# Patient Record
Sex: Female | Born: 1975 | Race: White | Hispanic: No | Smoking: Never smoker
Health system: Southern US, Community
[De-identification: ages and names within clinical notes are randomized; demographics above are authoritative.]

## PROBLEM LIST (undated history)

## (undated) DIAGNOSIS — R112 Nausea with vomiting, unspecified: Secondary | ICD-10-CM

## (undated) DIAGNOSIS — Z800 Family history of malignant neoplasm of digestive organs: Secondary | ICD-10-CM

## (undated) DIAGNOSIS — Z803 Family history of malignant neoplasm of breast: Secondary | ICD-10-CM

## (undated) DIAGNOSIS — Z98811 Dental restoration status: Secondary | ICD-10-CM

## (undated) DIAGNOSIS — Z9889 Other specified postprocedural states: Secondary | ICD-10-CM

## (undated) DIAGNOSIS — C50919 Malignant neoplasm of unspecified site of unspecified female breast: Secondary | ICD-10-CM

## (undated) DIAGNOSIS — Z87898 Personal history of other specified conditions: Secondary | ICD-10-CM

## (undated) DIAGNOSIS — Z8052 Family history of malignant neoplasm of bladder: Secondary | ICD-10-CM

## (undated) DIAGNOSIS — G43909 Migraine, unspecified, not intractable, without status migrainosus: Secondary | ICD-10-CM

## (undated) HISTORY — DX: Family history of malignant neoplasm of digestive organs: Z80.0

## (undated) HISTORY — DX: Migraine, unspecified, not intractable, without status migrainosus: G43.909

## (undated) HISTORY — PX: BUNIONECTOMY: SHX129

## (undated) HISTORY — DX: Family history of malignant neoplasm of bladder: Z80.52

## (undated) HISTORY — PX: SHOULDER ARTHROSCOPY W/ LABRAL REPAIR: SHX2399

## (undated) HISTORY — PX: SHOULDER ARTHROSCOPY W/ ROTATOR CUFF REPAIR: SHX2400

## (undated) HISTORY — DX: Family history of malignant neoplasm of breast: Z80.3

---

## 1999-07-26 ENCOUNTER — Other Ambulatory Visit: Admission: RE | Admit: 1999-07-26 | Discharge: 1999-07-26 | Payer: Self-pay | Admitting: *Deleted

## 2003-07-15 ENCOUNTER — Ambulatory Visit: Admission: RE | Admit: 2003-07-15 | Discharge: 2003-07-15 | Payer: Self-pay

## 2003-07-26 ENCOUNTER — Emergency Department (HOSPITAL_COMMUNITY): Admission: EM | Admit: 2003-07-26 | Discharge: 2003-07-26 | Payer: Self-pay | Admitting: Emergency Medicine

## 2003-12-11 ENCOUNTER — Ambulatory Visit (HOSPITAL_COMMUNITY): Admission: RE | Admit: 2003-12-11 | Discharge: 2003-12-11 | Payer: Self-pay | Admitting: Neurology

## 2004-04-08 ENCOUNTER — Emergency Department (HOSPITAL_COMMUNITY): Admission: EM | Admit: 2004-04-08 | Discharge: 2004-04-08 | Payer: Self-pay | Admitting: Emergency Medicine

## 2004-07-20 ENCOUNTER — Emergency Department (HOSPITAL_COMMUNITY): Admission: EM | Admit: 2004-07-20 | Discharge: 2004-07-20 | Payer: Self-pay | Admitting: Emergency Medicine

## 2004-07-23 ENCOUNTER — Encounter: Admission: RE | Admit: 2004-07-23 | Discharge: 2004-07-23 | Payer: Self-pay | Admitting: Oncology

## 2004-11-19 ENCOUNTER — Inpatient Hospital Stay (HOSPITAL_COMMUNITY): Admission: AD | Admit: 2004-11-19 | Discharge: 2004-11-26 | Payer: Self-pay | Admitting: Oncology

## 2004-11-19 ENCOUNTER — Ambulatory Visit: Payer: Self-pay | Admitting: Internal Medicine

## 2004-11-19 ENCOUNTER — Ambulatory Visit: Payer: Self-pay | Admitting: *Deleted

## 2004-11-21 ENCOUNTER — Encounter: Payer: Self-pay | Admitting: Internal Medicine

## 2005-02-19 ENCOUNTER — Encounter: Admission: RE | Admit: 2005-02-19 | Discharge: 2005-02-19 | Payer: Self-pay | Admitting: Oncology

## 2005-04-02 ENCOUNTER — Ambulatory Visit: Admission: RE | Admit: 2005-04-02 | Discharge: 2005-04-02 | Payer: Self-pay | Admitting: Gynecologic Oncology

## 2005-05-01 HISTORY — PX: CERVICAL CONE BIOPSY: SUR198

## 2005-05-07 ENCOUNTER — Ambulatory Visit (HOSPITAL_COMMUNITY): Admission: RE | Admit: 2005-05-07 | Discharge: 2005-05-07 | Payer: Self-pay | Admitting: Gynecologic Oncology

## 2005-06-04 ENCOUNTER — Ambulatory Visit: Admission: RE | Admit: 2005-06-04 | Discharge: 2005-06-04 | Payer: Self-pay | Admitting: Gynecologic Oncology

## 2005-09-05 ENCOUNTER — Ambulatory Visit: Payer: Self-pay | Admitting: Oncology

## 2005-10-01 ENCOUNTER — Ambulatory Visit: Admission: RE | Admit: 2005-10-01 | Discharge: 2005-10-01 | Payer: Self-pay | Admitting: Gynecologic Oncology

## 2005-10-01 ENCOUNTER — Other Ambulatory Visit: Admission: RE | Admit: 2005-10-01 | Discharge: 2005-10-01 | Payer: Self-pay | Admitting: Gynecologic Oncology

## 2005-10-01 LAB — CBC WITH DIFFERENTIAL/PLATELET
BASO%: 0.3 % (ref 0.0–2.0)
Basophils Absolute: 0 10*3/uL (ref 0.0–0.1)
EOS%: 0.7 % (ref 0.0–7.0)
Eosinophils Absolute: 0 10*3/uL (ref 0.0–0.5)
HCT: 39.9 % (ref 34.8–46.6)
HGB: 14 g/dL (ref 11.6–15.9)
LYMPH%: 44.2 % (ref 14.0–48.0)
MCH: 32.4 pg (ref 26.0–34.0)
MCHC: 35.1 g/dL (ref 32.0–36.0)
MCV: 92.3 fL (ref 81.0–101.0)
MONO#: 0.3 10*3/uL (ref 0.1–0.9)
MONO%: 11.6 % (ref 0.0–13.0)
NEUT#: 1.2 10*3/uL — ABNORMAL LOW (ref 1.5–6.5)
NEUT%: 43.2 % (ref 39.6–76.8)
Platelets: 219 10*3/uL (ref 145–400)
RBC: 4.33 10*6/uL (ref 3.70–5.32)
RDW: 13 % (ref 11.3–14.5)
WBC: 2.8 10*3/uL — ABNORMAL LOW (ref 3.9–10.0)
lymph#: 1.3 10*3/uL (ref 0.9–3.3)

## 2005-10-05 LAB — VON WILLEBRAND PANEL
Factor-VIII Activity: 77 % (ref 75–150)
Ristocetin-Cofactor: 87 % (ref 50–150)
Von Willebrand Ag: 127 % normal (ref 60–150)

## 2005-10-21 ENCOUNTER — Ambulatory Visit: Payer: Self-pay | Admitting: Oncology

## 2012-01-01 ENCOUNTER — Other Ambulatory Visit (HOSPITAL_COMMUNITY)
Admission: RE | Admit: 2012-01-01 | Discharge: 2012-01-01 | Disposition: A | Discharge status: Home / Self Care | Payer: BC Managed Care – PPO | Source: Ambulatory Visit | Attending: Gynecologic Oncology | Admitting: Gynecologic Oncology

## 2012-01-01 ENCOUNTER — Encounter: Payer: Self-pay | Admitting: Gynecologic Oncology

## 2012-01-01 ENCOUNTER — Ambulatory Visit: Payer: BC Managed Care – PPO | Attending: Gynecologic Oncology | Admitting: Gynecologic Oncology

## 2012-01-01 VITALS — BP 110/74 | HR 76 | Temp 98.3°F | Resp 16 | Ht 65.47 in | Wt 206.5 lb

## 2012-01-01 DIAGNOSIS — Z7982 Long term (current) use of aspirin: Secondary | ICD-10-CM | POA: Insufficient documentation

## 2012-01-01 DIAGNOSIS — Z833 Family history of diabetes mellitus: Secondary | ICD-10-CM | POA: Insufficient documentation

## 2012-01-01 DIAGNOSIS — IMO0002 Reserved for concepts with insufficient information to code with codable children: Secondary | ICD-10-CM | POA: Insufficient documentation

## 2012-01-01 DIAGNOSIS — R6889 Other general symptoms and signs: Secondary | ICD-10-CM | POA: Insufficient documentation

## 2012-01-01 DIAGNOSIS — Z01419 Encounter for gynecological examination (general) (routine) without abnormal findings: Secondary | ICD-10-CM | POA: Insufficient documentation

## 2012-01-01 DIAGNOSIS — Z823 Family history of stroke: Secondary | ICD-10-CM | POA: Insufficient documentation

## 2012-01-01 DIAGNOSIS — Z809 Family history of malignant neoplasm, unspecified: Secondary | ICD-10-CM | POA: Insufficient documentation

## 2012-01-01 DIAGNOSIS — Z800 Family history of malignant neoplasm of digestive organs: Secondary | ICD-10-CM | POA: Insufficient documentation

## 2012-01-01 DIAGNOSIS — Z1159 Encounter for screening for other viral diseases: Secondary | ICD-10-CM | POA: Insufficient documentation

## 2012-01-01 DIAGNOSIS — D069 Carcinoma in situ of cervix, unspecified: Secondary | ICD-10-CM | POA: Insufficient documentation

## 2012-01-01 NOTE — Progress Notes (Signed)
Consult Note: Gyn-Onc  Consult was requested by self for the evaluation of Holly Wiley 36 y.o. female  CC:  Chief Complaint  Patient presents with  . Abnormal Pap Smear    Past pt    HPI: This is a 36 year old G0 who was previously seen in this practice by Dr. Kyla Balzarine for abnormal Pap tests between 2006-2007. 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 The last Pap test on 10/01/2005 was negative for intraepithelial lesions.  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 She was then verbally informed that her Pap test was abnormal but she is unclear of the degree of dysplasia identified. She denies any vaginal bleeding.  Current Meds:  Outpatient Encounter Prescriptions as of 01/01/2012  Medication Sig Dispense Refill  . aspirin 81 MG tablet Take 81 mg by mouth daily.      . Multiple Vitamins-Minerals (MULTIVITAMIN PO) Take 1 tablet by mouth daily.      Marland Kitchen venlafaxine XR (EFFEXOR-XR) 150 MG 24 hr capsule Take 300 mg by mouth daily.        Allergy:  Allergies  Allergen Reactions  . Imitrex (Sumatriptan)     Seizure-like activity    Social Hx:   History   Social History  . Marital Status: Divorced    Spouse Name: N/A    Number of Children: N/A  . Years of Education: N/A   Occupational History  . Not on file.   Social History Main Topics  . Smoking status: Never Smoker   . Smokeless tobacco: Not on file  . Alcohol Use: Yes     occas.  . Drug Use: No  . Sexually Active: Not on file   Other Topics Concern  . Not on file   Social History Narrative  . No narrative on file    Past Surgical Hx:  Past Surgical History  Procedure Date  . Cervical cone biopsy 05/2005  . Rotator cuff repair 1997    Left  . Foot surgery 2009    left  foot    Past Medical Hx:  Past Medical History  Diagnosis Date  . Migraines     severe    Past Gynecological History:  Go Menarche 12 regular  Patient's last menstrual period was 12/30/2011. Used OCPs for 21 years for dysmenorrhea.  Discontinued 3/ 2013 because of migraines. Denies dysmenorrhea. History of CIN-3 in 2006  Family Hx:  Family History  Problem Relation Age of Onset  . Cancer Maternal Aunt   . Diabetes Maternal Aunt   . Colon cancer Maternal Uncle   . Diabetes Maternal Grandmother   . Stroke Paternal Grandfather     Review of Systems:  Constitutional  Feels well,   Skin/Breast  No rash, sores, jaundice, itching, dryness Cardiovascular  No chest pain, shortness of breath,  Pulmonary  No cough or wheeze.  Gastro Intestinal  No nausea, vomitting, or diarrhoea. No bright red blood per rectum, no abdominal pain, change in bowel movement, or constipation.  Genito Urinary  No frequency, urgency, dysuria,  No abnormal bleeding or discharge Musculo Skeletal  No myalgia, arthralgia, joint swelling   Neurologic  No weakness, numbness, change in gait,  Psychology  No depression, anxiety, insomnia.   Vitals:  Blood pressure 110/74, pulse 76, temperature 98.3  F (36.8 C), temperature source Oral, resp. rate 16, height 5' 5.47" (1.663 m), weight 206 lb 8 oz (93.668 kg), last menstrual period 12/30/2011.  Physical Exam: WD in NAD Neck  Supple NROM, without any enlargements.  Lymph Node Survey No cervical supraclavicular or inguinal adenopathy Cardiovascular  Pulse normal rate, regularity and rhythm. S1 and S2 normal.  Lungs  Clear to auscultation bilateraly, without wheezes/crackles/rhonchi. Good air movement.  Skin  No rash/lesions/breakdown  Psychiatry  Alert and oriented to person, place, and time  Abdomen  Normoactive bowel sounds, abdomen soft, non-tender and obese. Surgical  sites intact without evidence of hernia.  Back No CVA tenderness Genito  Urinary  Vulva/vagina: Normal external female genitalia.  No lesions. No discharge or bleeding.  Vagina: Well estrogenized no discharge or bleeding  Cervix: Approximately 3.5 cm. Colposcopy was performed and a white lesion with punctation was noted at approximately 6:00. Colposcopy was satisfactory. An endocervical curettage was also obtained.  Uterus: Small, mobile, no parametrial involvement or nodularity.  Adnexa: No palpable masses. Rectal  Good tone, no masses no cul de sac nodularity.  Extremities  No bilateral cyanosis, clubbing or edema.   Assessment/Plan:  Ms. Holly Wiley  is a 36 y.o.  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 Patient's been without gynecologic care until December 2012.  She states that she was informed that the Pap test was abnormal. She's been unable to obtain the correct course in the physician or to obtain a copy of the Pap test from the lab.  Pap test for HPV DNA testing was obtained today. Colposcopy was performed with biopsy of an acetowhite lesion.  The patient is asked for recommendation regarding gynecologist I have recommended Dr. Antionette Char. We will contact her with the results of the cervical biopsy next week.    Laurette Schimke, MD, PhD 01/01/2012, 2:57 PM

## 2012-01-01 NOTE — Patient Instructions (Addendum)
Pap test for HPV DNA testing was obtained today. Colposcopy was performed with biopsy of an acetowhite lesion.  I recommended Dr. Antionette Char to provide routine GYN care We will contact you with the results of the cervical biopsy next week.    Thank you very much Ms. Holly Wiley for allowing me to provide care for you today.  I appreciate your confidence in choosing our Gynecologic Oncology team.  If you have any questions about your visit today please call our office and we will get back to you as soon as possible.  Maryclare Labrador. Moesha Sarchet MD., PhD Gynecologic Oncology

## 2012-01-07 ENCOUNTER — Telehealth: Payer: Self-pay | Admitting: Gynecologic Oncology

## 2012-01-07 NOTE — Telephone Encounter (Signed)
Pt informed of pap smear results: negative and ECC/biopsy results: no dysplasia or malignancy.  No questions or concerns voiced.  Instructed to call for any needs.

## 2012-01-16 ENCOUNTER — Telehealth: Payer: Self-pay | Admitting: Gynecologic Oncology

## 2012-01-16 NOTE — Telephone Encounter (Signed)
Spoke with the patient about Dr. Forrestine Him recommendations to follow up with Dr. Antionette Char in 6 months.  Pt verbalizing understanding.  No questions or concerns voiced.

## 2012-04-02 ENCOUNTER — Other Ambulatory Visit: Payer: Self-pay | Admitting: Orthopedic Surgery

## 2012-04-10 ENCOUNTER — Encounter (HOSPITAL_BASED_OUTPATIENT_CLINIC_OR_DEPARTMENT_OTHER): Admission: RE | Payer: Self-pay | Source: Ambulatory Visit

## 2012-04-10 ENCOUNTER — Ambulatory Visit (HOSPITAL_BASED_OUTPATIENT_CLINIC_OR_DEPARTMENT_OTHER)
Admission: RE | Admit: 2012-04-10 | Payer: BC Managed Care – PPO | Source: Ambulatory Visit | Admitting: Orthopedic Surgery

## 2012-04-10 SURGERY — EXCISION, GANGLION CYST, WRIST
Anesthesia: General | Laterality: Left

## 2012-05-26 ENCOUNTER — Emergency Department (HOSPITAL_COMMUNITY)
Admission: EM | Admit: 2012-05-26 | Discharge: 2012-05-26 | Disposition: A | Discharge status: Home / Self Care | Payer: BC Managed Care – PPO | Source: Home / Self Care | Attending: Family Medicine | Admitting: Family Medicine

## 2012-05-26 ENCOUNTER — Encounter (HOSPITAL_COMMUNITY): Payer: Self-pay | Admitting: *Deleted

## 2012-05-26 DIAGNOSIS — J029 Acute pharyngitis, unspecified: Secondary | ICD-10-CM

## 2012-05-26 LAB — POCT RAPID STREP A: Streptococcus, Group A Screen (Direct): NEGATIVE

## 2012-05-26 MED ORDER — AMOXICILLIN 500 MG PO CAPS: 500.0000 mg | ORAL_CAPSULE | Freq: Three times a day (TID) | ORAL | Status: DC

## 2012-05-26 NOTE — ED Notes (Signed)
Pt reports sore throat, left ear pain, fever, body aches for the past 24 hrs. Unrelieved by otc meds.

## 2012-05-26 NOTE — ED Provider Notes (Addendum)
History     CSN: [id number]  Arrival date & time 05/26/12  1513   First MD Initiated Contact with Patient 05/26/12 1517      Chief Complaint  Patient presents with  . URI  . Sore Throat  . Otalgia    (Consider location/radiation/quality/duration/timing/severity/associated sxs/prior treatment) Patient is a 36 y.o. female presenting with pharyngitis. The history is provided by the patient.  Sore Throat This is a new problem. The current episode started yesterday. The problem has been gradually worsening. The symptoms are aggravated by swallowing.    Past Medical History  Diagnosis Date  . Migraines     severe    Past Surgical History  Procedure Date  . Cervical cone biopsy 05/2005  . Rotator cuff repair 1997    Left  . Foot surgery 2009    left foot    Family History  Problem Relation Age of Onset  . Cancer Maternal Aunt   . Diabetes Maternal Aunt   . Colon cancer Maternal Uncle   . Diabetes Maternal Grandmother   . Stroke Paternal Grandfather     History  Substance Use Topics  . Smoking status: Never Smoker   . Smokeless tobacco: Not on file  . Alcohol Use: Yes     Comment: occas.    OB History    Grav Para Term Preterm Abortions TAB SAB Ect Mult Living                  Review of Systems  Constitutional: Positive for fever.  HENT: Positive for ear pain, sore throat and neck pain. Negative for congestion, rhinorrhea and postnasal drip.   Eyes: Negative.   Respiratory: Negative.     Allergies  Imitrex  Home Medications   Current Outpatient Rx  Name  Route  Sig  Dispense  Refill  . ASPIRIN 81 MG PO TABS   Oral   Take 81 mg by mouth daily.         . MULTIVITAMIN PO   Oral   Take 1 tablet by mouth daily.         . VENLAFAXINE HCL ER 150 MG PO CP24   Oral   Take 300 mg by mouth daily.         . AMOXICILLIN 500 MG PO CAPS   Oral   Take 1 capsule (500 mg total) by mouth 3 (three) times daily.   30 capsule   0     BP 137/83   Pulse 101  Temp 97.8 F (36.6 C) (Oral)  Resp 18  SpO2 96%  LMP 05/12/2012  Physical Exam  Nursing note and vitals reviewed. Constitutional: She appears well-developed and well-nourished.  HENT:  Head: Normocephalic.  Right Ear: External ear normal.  Left Ear: External ear normal.  Mouth/Throat: Uvula is midline and mucous membranes are normal. Oropharyngeal exudate and posterior oropharyngeal erythema present.  Eyes: Pupils are equal, round, and reactive to light.    ED Course  Procedures (including critical care time)   Labs Reviewed  POCT RAPID STREP A ([facility] ONLY)   No results found.   1. Pharyngitis, acute       MDM          Linna Hoff, MD 05/26/12 1545  Linna Hoff, MD 05/26/12 1655

## 2012-06-29 ENCOUNTER — Emergency Department (HOSPITAL_COMMUNITY): Payer: BC Managed Care – PPO

## 2012-06-29 ENCOUNTER — Encounter (HOSPITAL_COMMUNITY): Payer: Self-pay | Admitting: *Deleted

## 2012-06-29 ENCOUNTER — Emergency Department (HOSPITAL_COMMUNITY)
Admission: EM | Admit: 2012-06-29 | Discharge: 2012-06-29 | Disposition: A | Discharge status: Home Health | Payer: BC Managed Care – PPO | Attending: Emergency Medicine | Admitting: Emergency Medicine

## 2012-06-29 DIAGNOSIS — R110 Nausea: Secondary | ICD-10-CM | POA: Insufficient documentation

## 2012-06-29 DIAGNOSIS — Y9229 Other specified public building as the place of occurrence of the external cause: Secondary | ICD-10-CM | POA: Insufficient documentation

## 2012-06-29 DIAGNOSIS — S0990XA Unspecified injury of head, initial encounter: Secondary | ICD-10-CM

## 2012-06-29 DIAGNOSIS — M542 Cervicalgia: Secondary | ICD-10-CM | POA: Insufficient documentation

## 2012-06-29 DIAGNOSIS — M549 Dorsalgia, unspecified: Secondary | ICD-10-CM | POA: Insufficient documentation

## 2012-06-29 DIAGNOSIS — Z7982 Long term (current) use of aspirin: Secondary | ICD-10-CM | POA: Insufficient documentation

## 2012-06-29 DIAGNOSIS — Y9301 Activity, walking, marching and hiking: Secondary | ICD-10-CM | POA: Insufficient documentation

## 2012-06-29 DIAGNOSIS — W010XXA Fall on same level from slipping, tripping and stumbling without subsequent striking against object, initial encounter: Secondary | ICD-10-CM | POA: Insufficient documentation

## 2012-06-29 DIAGNOSIS — G43909 Migraine, unspecified, not intractable, without status migrainosus: Secondary | ICD-10-CM | POA: Insufficient documentation

## 2012-06-29 MED ORDER — MECLIZINE HCL 25 MG PO TABS
25.0000 mg | ORAL_TABLET | Freq: Once | ORAL | Status: AC
  Administered 2012-06-29: 25 mg via ORAL
  Filled 2012-06-29: qty 1

## 2012-06-29 MED ORDER — MECLIZINE HCL 25 MG PO TABS: 25.0000 mg | ORAL_TABLET | Freq: Three times a day (TID) | ORAL | Status: DC | PRN

## 2012-06-29 NOTE — ED Provider Notes (Signed)
History     CSN: [id number]  Arrival date & time 06/29/12  1410   First MD Initiated Contact with Patient 06/29/12 1422      Chief Complaint  Patient presents with  . Fall  . Headache  . Back Pain    (Consider location/radiation/quality/duration/timing/severity/associated sxs/prior treatment) Patient is a 36 y.o. female presenting with fall, headaches, and back pain. The history is provided by the patient.  Fall The accident occurred less than 1 hour ago. The fall occurred while walking (Pateint slipped in a puddle of water in the bathroom at a [employer].  She landed directly on her back hitting her head.). She landed on a hard floor. There was no blood loss. The point of impact was the head. The pain is present in the head and neck. The pain is at a severity of 10/10. The pain is severe. She was ambulatory at the scene. There was no drug use involved in the accident. Associated symptoms include nausea and headaches. Pertinent negatives include no visual change, no fever, no numbness, no abdominal pain, no vomiting, no loss of consciousness and no tingling. Associated symptoms comments: Patient has dizziness which is worse with head movement.  She has had no loss of consciousness.. Treatment on scene includes a c-collar. She has tried nothing for the symptoms. The treatment provided no relief.  Headache  Associated symptoms include nausea. Pertinent negatives include no fever, no shortness of breath and no vomiting.  Back Pain  Associated symptoms include headaches. Pertinent negatives include no chest pain, no fever, no numbness, no abdominal pain, no tingling and no weakness.    Past Medical History  Diagnosis Date  . Migraines     severe    Past Surgical History  Procedure Date  . Cervical cone biopsy 05/2005  . Rotator cuff repair 1997    Left  . Foot surgery 2009    left foot    Family History  Problem Relation Age of Onset  . Cancer Maternal Aunt   . Diabetes  Maternal Aunt   . Colon cancer Maternal Uncle   . Diabetes Maternal Grandmother   . Stroke Paternal Grandfather     History  Substance Use Topics  . Smoking status: Never Smoker   . Smokeless tobacco: Not on file  . Alcohol Use: Yes     Comment: occas.    OB History    Grav Para Term Preterm Abortions TAB SAB Ect Mult Living                  Review of Systems  Constitutional: Negative for fever.  HENT: Positive for neck pain. Negative for sore throat, neck stiffness and ear discharge.   Eyes: Negative.  Negative for photophobia and visual disturbance.  Respiratory: Negative for chest tightness and shortness of breath.   Cardiovascular: Negative for chest pain.  Gastrointestinal: Positive for nausea. Negative for vomiting and abdominal pain.  Genitourinary: Negative.   Musculoskeletal: Positive for back pain. Negative for joint swelling and arthralgias.  Skin: Negative.  Negative for wound.  Neurological: Positive for headaches. Negative for dizziness, tingling, loss of consciousness, weakness, light-headedness and numbness.  Hematological: Negative.   Psychiatric/Behavioral: Negative.     Allergies  Imitrex  Home Medications   Current Outpatient Rx  Name  Route  Sig  Dispense  Refill  . ASPIRIN 81 MG PO TABS   Oral   Take 81 mg by mouth daily.         Marland Kitchen  MULTIVITAMIN PO   Oral   Take 1 tablet by mouth daily.         Marland Kitchen MECLIZINE HCL 25 MG PO TABS   Oral   Take 1 tablet (25 mg total) by mouth 3 (three) times daily as needed for dizziness.   30 tablet   0     LMP 06/22/2012  Physical Exam  Nursing note and vitals reviewed. Constitutional: She is oriented to person, place, and time. She appears well-developed and well-nourished.  HENT:  Head: Normocephalic and atraumatic.  Right Ear: External ear normal. No hemotympanum.  Left Ear: External ear normal. No hemotympanum.  Mouth/Throat: Oropharynx is clear and moist.  Eyes: EOM are normal. Pupils are  equal, round, and reactive to light.  Neck: Normal range of motion. Neck supple.  Cardiovascular: Normal rate and normal heart sounds.   Pulmonary/Chest: Effort normal.  Abdominal: Soft. There is no tenderness.  Musculoskeletal: Normal range of motion.  Lymphadenopathy:    She has no cervical adenopathy.  Neurological: She is alert and oriented to person, place, and time. She has normal strength. No sensory deficit. Gait normal. GCS eye subscore is 4. GCS verbal subscore is 5. GCS motor subscore is 6.       Normal heel-shin, normal rapid alternating movements. Cranial nerves III-XII intact.  No pronator drift.  Skin: Skin is warm and dry. No rash noted.  Psychiatric: She has a normal mood and affect. Her speech is normal and behavior is normal. Thought content normal. Cognition and memory are normal.    ED Course  Procedures (including critical care time)  Labs Reviewed - No data to display Ct Head Wo Contrast  06/29/2012  *RADIOLOGY REPORT*  Clinical Data:  Fall with trauma to the head and neck.  CT HEAD WITHOUT CONTRAST CT CERVICAL SPINE WITHOUT CONTRAST  Technique:  Multidetector CT imaging of the head and cervical spine was performed following the standard protocol without intravenous contrast.  Multiplanar CT image reconstructions of the cervical spine were also generated.  Comparison:  08/18/2011.  09/23/2008.  11/21/2004.  CT HEAD  Findings: The brain has a normal appearance without evidence of atrophy, malformation, old or acute infarction, mass lesion, hemorrhage, hydrocephalus or extra-axial collection.  The calvarium is unremarkable.  There is some fluid or mucus in the right maxillary sinus without evidence of regional fracture.  IMPRESSION: Normal appearance of the brain.  Fluid or mucus in the right maxillary sinus without evidence of fracture.  CT CERVICAL SPINE  Findings: Normal alignment.  No fracture, degenerative change or other focal finding.  IMPRESSION: Negative   Original  Report Authenticated By: Paulina Fusi, M.D.    Ct Cervical Spine Wo Contrast  06/29/2012  *RADIOLOGY REPORT*  Clinical Data:  Fall with trauma to the head and neck.  CT HEAD WITHOUT CONTRAST CT CERVICAL SPINE WITHOUT CONTRAST  Technique:  Multidetector CT imaging of the head and cervical spine was performed following the standard protocol without intravenous contrast.  Multiplanar CT image reconstructions of the cervical spine were also generated.  Comparison:  08/18/2011.  09/23/2008.  11/21/2004.  CT HEAD  Findings: The brain has a normal appearance without evidence of atrophy, malformation, old or acute infarction, mass lesion, hemorrhage, hydrocephalus or extra-axial collection.  The calvarium is unremarkable.  There is some fluid or mucus in the right maxillary sinus without evidence of regional fracture.  IMPRESSION: Normal appearance of the brain.  Fluid or mucus in the right maxillary sinus without evidence of fracture.  CT CERVICAL SPINE  Findings: Normal alignment.  No fracture, degenerative change or other focal finding.  IMPRESSION: Negative   Original Report Authenticated By: Paulina Fusi, M.D.      1. Minor head injury without loss of consciousness       MDM  Pt still with mild vertiginous sx with significant movement. Antivert prescribed and dose given prior to dc home.  Parents at bedside to take pt home.  Reviewed CT's with patient.  Advised recheck for any worsened sx or if not improved over the next week.  Antivert prescribed.        Burgess Amor, [place] 06/29/12 [id number]

## 2012-06-29 NOTE — ED Provider Notes (Signed)
Medical screening examination/treatment/procedure(s) were conducted as a shared visit with non-physician practitioner(s) and myself.  I personally evaluated the patient during the encounter  Patient slipped and fell prior to arrival striking her occiput. No loss of consciousness. Some vomiting since the accident. Will order head CT and CT of the C-spine. Suspect the patient likely has a concussion. Workup is pending  Toy Baker, MD 06/29/12 1425

## 2012-06-29 NOTE — ED Notes (Signed)
[place] [employer] [place], patient c/o neck and back pain, headache, patient dry heaving since hitting head, patient with no loc

## 2012-06-30 NOTE — ED Provider Notes (Signed)
Medical screening examination/treatment/procedure(s) were performed by non-physician practitioner and as supervising physician I was immediately available for consultation/collaboration.  Deanna Wiater T Aleksey Newbern, MD 06/30/12 0750 

## 2012-07-01 DIAGNOSIS — Z87898 Personal history of other specified conditions: Secondary | ICD-10-CM

## 2012-07-01 HISTORY — PX: COLONOSCOPY: SHX174

## 2012-07-01 HISTORY — DX: Personal history of other specified conditions: Z87.898

## 2012-07-06 ENCOUNTER — Emergency Department (HOSPITAL_COMMUNITY): Payer: BC Managed Care – PPO

## 2012-07-06 ENCOUNTER — Encounter (HOSPITAL_COMMUNITY): Payer: Self-pay | Admitting: Emergency Medicine

## 2012-07-06 ENCOUNTER — Emergency Department (HOSPITAL_COMMUNITY)
Admission: EM | Admit: 2012-07-06 | Discharge: 2012-07-06 | Disposition: A | Discharge status: Home / Self Care | Payer: BC Managed Care – PPO | Attending: Emergency Medicine | Admitting: Emergency Medicine

## 2012-07-06 DIAGNOSIS — F0781 Postconcussional syndrome: Secondary | ICD-10-CM | POA: Insufficient documentation

## 2012-07-06 DIAGNOSIS — Z8679 Personal history of other diseases of the circulatory system: Secondary | ICD-10-CM | POA: Insufficient documentation

## 2012-07-06 DIAGNOSIS — R42 Dizziness and giddiness: Secondary | ICD-10-CM | POA: Insufficient documentation

## 2012-07-06 DIAGNOSIS — Z7982 Long term (current) use of aspirin: Secondary | ICD-10-CM | POA: Insufficient documentation

## 2012-07-06 DIAGNOSIS — R4182 Altered mental status, unspecified: Secondary | ICD-10-CM | POA: Insufficient documentation

## 2012-07-06 DIAGNOSIS — Z79899 Other long term (current) drug therapy: Secondary | ICD-10-CM | POA: Insufficient documentation

## 2012-07-06 MED ORDER — ONDANSETRON HCL 4 MG PO TABS: 4.0000 mg | ORAL_TABLET | Freq: Four times a day (QID) | ORAL | Status: DC

## 2012-07-06 MED ORDER — BUTALBITAL-APAP-CAFFEINE 50-325-40 MG PO TABS: 1.0000 | ORAL_TABLET | Freq: Two times a day (BID) | ORAL | Status: DC | PRN

## 2012-07-06 NOTE — ED Provider Notes (Signed)
History     CSN: [id number]  Arrival date & time 07/06/12  1223   First MD Initiated Contact with Patient 07/06/12 1518      Chief Complaint  Patient presents with  . Headache  . Altered Mental Status    (Consider location/radiation/quality/duration/timing/severity/associated sxs/prior treatment) HPI  Holly Wiley is a 37 y.o. female with no past medical history complaining of intermittent altered mental status, nausea vomiting, dysarthria, changes in depth perception, ataxia, disturbance to her short-term memory, light sensitivity since her slip and fall with head trauma in a restaurant approximately one week ago. Patient was seen in the ED at that time and had negative head and C-spine CT. Condition has been waxing and waning she is currently residing with her parents.   Past Medical History  Diagnosis Date  . Migraines     severe    Past Surgical History  Procedure Date  . Cervical cone biopsy 05/2005  . Rotator cuff repair 1997    Left  . Foot surgery 2009    left foot    Family History  Problem Relation Age of Onset  . Cancer Maternal Aunt   . Diabetes Maternal Aunt   . Colon cancer Maternal Uncle   . Diabetes Maternal Grandmother   . Stroke Paternal Grandfather     History  Substance Use Topics  . Smoking status: Never Smoker   . Smokeless tobacco: Not on file  . Alcohol Use: Yes     Comment: occas.    OB History    Grav Para Term Preterm Abortions TAB SAB Ect Mult Living                  Review of Systems  Constitutional: Negative for fever.  Respiratory: Negative for shortness of breath.   Cardiovascular: Negative for chest pain.  Gastrointestinal: Negative for nausea, vomiting, abdominal pain and diarrhea.  Neurological: Positive for dizziness and headaches.  All other systems reviewed and are negative.    Allergies  Imitrex  Home Medications   Current Outpatient Rx  Name  Route  Sig  Dispense  Refill  . ASPIRIN 81 MG PO TABS  Oral   Take 81 mg by mouth daily.         Marland Kitchen MECLIZINE HCL 25 MG PO TABS   Oral   Take 1 tablet (25 mg total) by mouth 3 (three) times daily as needed for dizziness.   30 tablet   0   . MULTIVITAMIN PO   Oral   Take 1 tablet by mouth daily.         . VENLAFAXINE HCL ER 150 MG PO CP24   Oral   Take 300 mg by mouth daily.           BP 116/70  Pulse 103  Temp 97.9 F (36.6 C) (Oral)  Resp 18  SpO2 98%  LMP 06/22/2012  Physical Exam  Nursing note and vitals reviewed. Constitutional: She is oriented to person, place, and time. She appears well-developed and well-nourished. No distress.  HENT:  Head: Normocephalic.  Mouth/Throat: Oropharynx is clear and moist.  Eyes: Conjunctivae normal and EOM are normal. Pupils are equal, round, and reactive to light.  Cardiovascular: Normal rate.   Pulmonary/Chest: Effort normal. No stridor.  Musculoskeletal: Normal range of motion.  Neurological: She is alert and oriented to person, place, and time.       Cranial nerves III through XII intact, strength 5 out of 5x4 extremities, negative pronator drift,  finger to nose and heel-to-shin coordinated, sensation intact to pinprick and light touch, gait is coordinated and Romberg is negative. Marland Kitchen   Psychiatric: She has a normal mood and affect.    ED Course  Procedures (including critical care time)  Labs Reviewed - No data to display Ct Head Wo Contrast  07/06/2012  *RADIOLOGY REPORT*  Clinical Data: Headache with altered mental status  CT HEAD WITHOUT CONTRAST  Technique:  Contiguous axial images were obtained from the base of the skull through the vertex without contrast.  Comparison: 06/29/2012  Findings: There is no evidence for acute hemorrhage, hydrocephalus, mass lesion, or abnormal extra-axial fluid collection.  No definite CT evidence for acute infarction. Stable appearance of CSF density focus in the left basal ganglia, suggesting dilated perivascular space.  The visualized  paranasal sinuses and mastoid air cells are clear.  IMPRESSION: Stable.  No acute intracranial abnormality.   Original Report Authenticated By: Kennith Center, M.D.      1. Postconcussive syndrome       MDM  Patient with normal neuro exam, interval head CT shows no change. Explained to patient that we cannot fill out FMLA forms  Discussed case with attending who agrees with plan and stability to d/c to home.   Encourage her to follow with her primary care Dr. for further management.    Pt verbalized understanding and agrees with care plan. Outpatient follow-up and return precautions given.          Wynetta Emery, PA-C 07/06/12 1719

## 2012-07-06 NOTE — ED Notes (Signed)
Pt sent here for reeval after fall one week ago; pt c/o increased HA in back of head and confusion at times and trouble finding words with some nausea; pt sts some trouble with balance; PERRL at present; pt alert at present

## 2012-07-06 NOTE — ED Notes (Signed)
Patient and patient 2 family members at bedside. States one week ago slipped in a puddle of water and hit posterior head.  Seen in ED and CT was negative. Since then patient and family state patient having same symptoms nausea headache throbbing intermittent pain 7-10/10, confusion, trouble getting words out.  Patient answering and following commands appropriate with periods of confusion. Example did not know what month it currently is.

## 2012-07-06 NOTE — ED Notes (Signed)
Ambulatory steady gait.  

## 2012-07-08 NOTE — ED Provider Notes (Signed)
Medical screening examination/treatment/procedure(s) were performed by non-physician practitioner and as supervising physician I was immediately available for consultation/collaboration.   Laray Anger, DO 07/08/12 1006

## 2013-04-26 ENCOUNTER — Ambulatory Visit (INDEPENDENT_AMBULATORY_CARE_PROVIDER_SITE_OTHER): Payer: No Typology Code available for payment source | Admitting: Obstetrics & Gynecology

## 2013-04-26 ENCOUNTER — Encounter: Payer: Self-pay | Admitting: Obstetrics & Gynecology

## 2013-04-26 VITALS — BP 127/87 | HR 85 | Temp 98.4°F | Ht 66.0 in | Wt 203.0 lb

## 2013-04-26 DIAGNOSIS — Z01419 Encounter for gynecological examination (general) (routine) without abnormal findings: Secondary | ICD-10-CM

## 2013-04-26 NOTE — Patient Instructions (Signed)

## 2013-04-26 NOTE — Progress Notes (Signed)
.   Subjective:     Holly Wiley is a 37 y.o. female here for a routine exam.  Current complaints: Patient would like a  cmet blood draw.  Personal health questionnaire reviewed: no.   Gynecologic History Patient's last menstrual period was 04/19/2013. Contraception: none Last Pap: 2013. Results were: normal Last mammogram: 2012. Results were: normal  Obstetric History OB History  No data available     The following portions of the patient's history were reviewed and updated as appropriate: allergies, current medications, past family history, past medical history, past social history, past surgical history and problem list.  Review of Systems Pertinent items are noted in HPI.    Objective:      General appearance: alert Breasts: normal appearance, no masses or tenderness Abdomen: soft, non-tender; bowel sounds normal; no masses,  no organomegaly Pelvic: cervix normal in appearance, external genitalia normal, no adnexal masses or tenderness, uterus normal size, shape, and consistency and vagina normal without discharge       Assessment:    Healthy female exam.    Plan:   Return in 1 year for Pap/HPV co-test

## 2013-04-27 LAB — PAP IG W/ RFLX HPV ASCU

## 2013-10-20 ENCOUNTER — Encounter (INDEPENDENT_AMBULATORY_CARE_PROVIDER_SITE_OTHER): Payer: Self-pay | Admitting: General Surgery

## 2013-10-20 ENCOUNTER — Ambulatory Visit (INDEPENDENT_AMBULATORY_CARE_PROVIDER_SITE_OTHER): Payer: BC Managed Care – PPO | Admitting: General Surgery

## 2013-10-20 VITALS — BP 120/80 | HR 68 | Temp 97.2°F | Ht 66.0 in | Wt 216.8 lb

## 2013-10-20 DIAGNOSIS — N63 Unspecified lump in unspecified breast: Secondary | ICD-10-CM

## 2013-10-20 DIAGNOSIS — N6310 Unspecified lump in the right breast, unspecified quadrant: Secondary | ICD-10-CM | POA: Insufficient documentation

## 2013-10-20 HISTORY — DX: Unspecified lump in the right breast, unspecified quadrant: N63.10

## 2013-10-20 NOTE — Patient Instructions (Signed)
I am able to feel the lump in your right breast in the lower outer quadrant. He feels a somewhat rubbery and more likely to be benign. I cannot be sure however.  Because of this new finding and because of your mothers history of breast cancer, one of the options is to proceed with excisional biopsy.  You have stated that you would like to go ahead with biopsy to be sure of the diagnosis  You will be scheduled for excision of right breast mass under local anesthesia in the near future.      Breast Biopsy A breast biopsy is a procedure where a sample of breast tissue is removed from your breast. The tissue is examined under a microscope to see if cancerous cells are present. A breast biopsy is done when there is:  Any undiagnosed breast mass (tumor).  Nipple abnormalities, dimpling, crusting, or ulcerations.  Abnormal discharge from the nipple, especially blood.  Redness, swelling, and pain of the breast.  Calcium deposits (calcifications) or abnormalities seen on a mammogram, ultrasound result, or results of magnetic resonance imaging (MRI).  Suspicious changes in the breast seen on your mammogram. If the tumor is found to be cancerous (malignant), a breast biopsy can help to determine what the best treatment is for you. There are many different types of breast biopsies. Talk to your caregiver about your options and which type is best for you. LET YOUR CAREGIVER KNOW ABOUT:  Allergies to food or medicine.  Medicines taken, including vitamins, herbs, eyedrops, over-the-counter medicines, and creams.  Use of steroids (by mouth or creams).  Previous problems with anesthetics or numbing medicines.  History of bleeding problems or blood clots.  Previous surgery.  Other health problems, including diabetes and kidney problems.  Any recent colds or infections.  Possibility of pregnancy, if this applies. RISKS AND COMPLICATIONS   Bleeding.  Infection.  Allergy to  medicines.  Bruising and swelling of the breast.  Alteration in the shape of the breast.  Not finding the lump or abnormality.  Needing more surgery. BEFORE THE PROCEDURE  Arrange for someone to drive you home after the procedure.  Do not smoke for 2 weeks before the procedure. Stop smoking, if you smoke.  Do not drink alcohol for 24 hours before procedure.  Wear a good support bra to the procedure. PROCEDURE  You may be given a medicine to numb the breast area (local anesthesia) or a medicine to make you sleep (general anesthesia) during the procedure. The following are the different types of biopsies that can be performed.   Fine-needle aspiration A thin needle is attached to a syringe and inserted into the breast lump. Fluid and cells are removed and then looked at under a microscope. If the breast lump cannot be felt, an ultrasound may be used to help locate the lump and place the needle in the correct area.   Core needle biopsy A wide, hollow needle (core needle) is inserted into the breast lump 3 6 times to get tissue samples or cores. The samples are removed. The needle is usually placed in the correct area by using an ultrasound or X-ray.   Stereotactic biopsy X-ray equipment and a computer are used to analyze X-ray pictures of the breast lump. The computer then finds exactly where the core needle needs to be inserted. Tissue samples are removed.   Vacuum-assisted biopsy A small incision (less than  inch) is made in your breast. A biopsy device that includes a hollow needle  and vacuum is passed through the incision and into the breast tissue. The vacuum gently draws abnormal breast tissue into the needle to remove it. This type of biopsy removes a larger tissue sample than a regular core needle biopsy. No stitches are needed, and there is usually little scarring.  Ultrasound-guided core needle biopsy A high frequency ultrasound helps guide the core needle to the area of the  mass or abnormality. An incision is made to insert the needle. Tissue samples are removed.  Open biopsy A larger incision is made in the breast. Your caregiver will attempt to remove the whole breast lump or as much as possible. AFTER THE PROCEDURE  You will be taken to the recovery area. If you are doing well and have no problems, you will be allowed to go home.  You may notice bruising on your breast. This is normal.  Your caregiver may apply a pressure dressing on your breast for 24 48 hours. A pressure dressing is a bandage that is wrapped tightly around the chest to stop fluid from collecting underneath tissues. Document Released: 06/17/2005 Document Revised: 10/12/2012 Document Reviewed: 07/18/2011 [facility] Patient Information 2014 [place].

## 2013-10-20 NOTE — Progress Notes (Signed)
Patient ID: Holly Wiley, female   DOB: March 28, 1976, 38 y.o.   MRN: [id number]  Chief Complaint  Patient presents with  . eval right breast    HPI [name] is a 38 y.o. female.  She is referred by Dr. Len Childs in [facility] for evaluation of a palpable mass in the right breast.  This patient is the daughter of a breast cancer patient of mine. Her mother recently underwent a bilateral mastectomies for stage I invasive cancer. The mother underwent genetic testing and this was reportedly negative.Marland KitchenMarland KitchenMarland KitchenThere has also been some breast cancer in third line relatives on both maternal and paternal side.  She states that 6 weeks ago she felt a lump in the right breast, lower outer quadrant. No pain. Unchanged. Recent mammograms and ultrasound were done in [place] and were read by Dr. Lovey Newcomer and there was no imaging abnormality. Because of her family history she is highly concerned and was referred for consideration of biopsy  Comorbidities include obesity, anxiety on Effexor.  HPI  Past Medical History  Diagnosis Date  . Migraines     severe    Past Surgical History  Procedure Laterality Date  . Cervical cone biopsy  05/2005  . Rotator cuff repair  1997    Left  . Foot surgery  2009    left foot    Family History  Problem Relation Age of Onset  . Cancer Maternal Aunt   . Diabetes Maternal Aunt   . Colon cancer Maternal Uncle   . Diabetes Maternal Grandmother   . Stroke Paternal Grandfather   . Cancer Mother     breast    Social History History  Substance Use Topics  . Smoking status: Never Smoker   . Smokeless tobacco: Not on file  . Alcohol Use: No     Comment: occas.    Allergies  Allergen Reactions  . Imitrex [Sumatriptan]     Seizure-like activity    Current Outpatient Prescriptions  Medication Sig Dispense Refill  . aspirin 81 MG tablet Take 81 mg by mouth daily.      . butalbital-acetaminophen-caffeine (FIORICET, ESGIC) 50-325-40 MG per tablet Take 1  tablet by mouth 2 (two) times daily as needed for headache.  14 tablet  0  . meclizine (ANTIVERT) 25 MG tablet Take 1 tablet (25 mg total) by mouth 3 (three) times daily as needed for dizziness.  30 tablet  0  . Multiple Vitamins-Minerals (MULTIVITAMIN PO) Take 1 tablet by mouth daily.      . ondansetron (ZOFRAN) 4 MG tablet Take 1 tablet (4 mg total) by mouth every 6 (six) hours.  12 tablet  0  . venlafaxine XR (EFFEXOR-XR) 150 MG 24 hr capsule Take 300 mg by mouth daily.      Marland Kitchen zolpidem (AMBIEN) 10 MG tablet        No current facility-administered medications for this visit.    Review of Systems Review of Systems  Constitutional: Negative for fever, chills and unexpected weight change.  HENT: Negative for congestion, hearing loss, sore throat, trouble swallowing and voice change.   Eyes: Negative for visual disturbance.  Respiratory: Negative for cough and wheezing.   Cardiovascular: Negative for chest pain, palpitations and leg swelling.  Gastrointestinal: Negative for nausea, vomiting, abdominal pain, diarrhea, constipation, blood in stool, abdominal distention and anal bleeding.  Genitourinary: Negative for hematuria, vaginal bleeding and difficulty urinating.  Musculoskeletal: Negative for arthralgias.  Skin: Negative for rash and wound.  Neurological: Negative for  seizures, syncope and headaches.  Hematological: Negative for adenopathy. Does not bruise/bleed easily.  Psychiatric/Behavioral: Negative for confusion. The patient is nervous/anxious.     Blood pressure 120/80, pulse 68, temperature 97.2 F (36.2 C), height 5\' 6"  (1.676 m), weight 216 lb 12.8 oz (98.34 kg).  Physical Exam Physical Exam  Constitutional: She is oriented to person, place, and time. She appears well-developed and well-nourished. No distress.  HENT:  Head: Normocephalic and atraumatic.  Nose: Nose normal.  Mouth/Throat: No oropharyngeal exudate.  Eyes: Conjunctivae and EOM are normal. Pupils are  equal, round, and reactive to light. Left eye exhibits no discharge. No scleral icterus.  Neck: Neck supple. No JVD present. No tracheal deviation present. No thyromegaly present.  Cardiovascular: Normal rate, regular rhythm, normal heart sounds and intact distal pulses.   No murmur heard. Pulmonary/Chest: Effort normal and breath sounds normal. No respiratory distress. She has no wheezes. She has no rales. She exhibits no tenderness.  Large breasts, size 42-H by history.       In the right breast, lower outer quadrant, 7:30 position, about 3 cm above the inframammary crease there is a 3 x 1.5 cm area of dense, rubbery tissue.     No overlying skin change.   Not obviously neoplastic.   No other mass in either breast.      Chronic telangiectasias of both breasts.     No nipple discharge.     No axillary adenopathy.  Abdominal: Soft. Bowel sounds are normal. She exhibits no distension and no mass. There is no tenderness. There is no rebound and no guarding.  Musculoskeletal: She exhibits no edema and no tenderness.  Lymphadenopathy:    She has no cervical adenopathy.  Neurological: She is alert and oriented to person, place, and time. She exhibits normal muscle tone. Coordination normal.  Skin: Skin is warm. No rash noted. She is not diaphoretic. No erythema. No pallor.  Psychiatric: She has a normal mood and affect. Her behavior is normal. Judgment and thought content normal.    Data Reviewed Mammogram and ultrasound. Office notes from [facility] in [place]    Right breast mass, lower outer quadrant. Indeterminate but probably and hopefully benign  Family history of breast cancer in mother, recent, and multiple other third line relatives, remote   Obesity  anxiety    Plan    I discussed the differential diagnosis with her. Discussed different options. Very clearly she was to go ahead and have this area conservatively excised because she thinks is a new  finding and she does have a family history of breast cancer. I agree with her.  She will be scheduled for excision of right breast mass under John anesthesia in the near future.  I discussed the indications, details techniques the numerous risks of the surgery with her. She is aware of the risk of bleeding, infection, cosmetic deformity, skin necrosis, nerve damage chronic pain, reoperation if this is cancer, and other unforseen  problems.. She understands all these issues. All her questions were answered. She agrees with this plan.        Edsel Petrin. Dalbert Batman, M.D., [facility] General and Minimally invasive Surgery Breast and Colorectal Surgery Office:   [phone] Pager:   [phone]  10/20/2013, 2:53 PM

## 2013-10-25 ENCOUNTER — Encounter (INDEPENDENT_AMBULATORY_CARE_PROVIDER_SITE_OTHER): Payer: Self-pay

## 2013-11-01 ENCOUNTER — Encounter (HOSPITAL_BASED_OUTPATIENT_CLINIC_OR_DEPARTMENT_OTHER): Payer: Self-pay | Admitting: *Deleted

## 2013-11-05 NOTE — H&P (Signed)
Holly Wiley    MRN:  [id number]   Description: 38 year old female  Provider: Adin Hector, MD  Department: [facility]              Diagnoses      Breast mass, right    -  Primary      611.72               Current Vitals Most recent update: 10/20/2013  2:23 PM by Vale Haven, CMA      BP Pulse Temp(Src) Ht Wt BMI      120/80 68 97.2 F (36.2 C) 5\' 6"  (1.676 m) 216 lb 12.8 oz (98.34 kg) 35.01 kg/m2                     History and Physical    Adin Hector, MD       Status: Signed            Patient ID: Holly Wiley, female   DOB: 06/29/1976, 38 y.o.   MRN: [id number]            Note: This dictation was prepared with Dragon/digital dictation along with [facility] technology. Any transcriptional errors that result from this process are unintentional.    HPI Holly Wiley is a 38 y.o. female.  She is referred by Dr. Len Childs in [facility] for evaluation of a palpable mass in the right breast.   This patient is the daughter of a breast cancer patient of mine. Her mother recently underwent a bilateral mastectomies for stage I invasive cancer. The mother underwent genetic testing and this was reportedly negative.Marland KitchenMarland KitchenMarland KitchenThere has also been some breast cancer in third line relatives on both maternal and paternal side.   She states that 6 weeks ago she felt a lump in the right breast, lower outer quadrant. No pain. Unchanged. Recent mammograms and ultrasound were done in [place] and were read by Dr. Lovey Newcomer and there was no imaging abnormality. Because of her family history she is highly concerned and was referred for consideration of biopsy   Comorbidities include obesity, anxiety on Effexor.         Past Medical History   Diagnosis  Date   .  Migraines         severe         Past Surgical History   Procedure  Laterality  Date   .  Cervical cone biopsy    05/2005   .  Rotator cuff repair    1997       Left   .  Foot surgery    2009       left foot         Family History   Problem  Relation  Age of Onset   .  Cancer  Maternal Aunt     .  Diabetes  Maternal Aunt     .  Colon cancer  Maternal Uncle     .  Diabetes  Maternal Grandmother     .  Stroke  Paternal Grandfather     .  Cancer  Mother         breast        Social History History   Substance Use Topics   .  Smoking status:  Never Smoker    .  Smokeless tobacco:  Not on file   .  Alcohol Use:  No  Comment: occas.         Allergies   Allergen  Reactions   .  Imitrex [Sumatriptan]         Seizure-like activity         Current Outpatient Prescriptions   Medication  Sig  Dispense  Refill   .  aspirin 81 MG tablet  Take 81 mg by mouth daily.         .  butalbital-acetaminophen-caffeine (FIORICET, ESGIC) 50-325-40 MG per tablet  Take 1 tablet by mouth 2 (two) times daily as needed for headache.   14 tablet   0   .  meclizine (ANTIVERT) 25 MG tablet  Take 1 tablet (25 mg total) by mouth 3 (three) times daily as needed for dizziness.   30 tablet   0   .  Multiple Vitamins-Minerals (MULTIVITAMIN PO)  Take 1 tablet by mouth daily.         .  ondansetron (ZOFRAN) 4 MG tablet  Take 1 tablet (4 mg total) by mouth every 6 (six) hours.   12 tablet   0   .  venlafaxine XR (EFFEXOR-XR) 150 MG 24 hr capsule  Take 300 mg by mouth daily.         Marland Kitchen  zolpidem (AMBIEN) 10 MG tablet                      Review of Systems   Constitutional: Negative for fever, chills and unexpected weight change.  HENT: Negative for congestion, hearing loss, sore throat, trouble swallowing and voice change.   Eyes: Negative for visual disturbance.  Respiratory: Negative for cough and wheezing.   Cardiovascular: Negative for chest pain, palpitations and leg swelling.  Gastrointestinal: Negative for nausea, vomiting, abdominal pain, diarrhea, constipation, blood in stool, abdominal distention and anal bleeding.  Genitourinary: Negative for hematuria, vaginal bleeding  and difficulty urinating.  Musculoskeletal: Negative for arthralgias.  Skin: Negative for rash and wound.  Neurological: Negative for seizures, syncope and headaches.  Hematological: Negative for adenopathy. Does not bruise/bleed easily.  Psychiatric/Behavioral: Negative for confusion. The patient is nervous/anxious.       Blood pressure 120/80, pulse 68, temperature 97.2 F (36.2 C), height 5\' 6"  (1.676 m), weight 216 lb 12.8 oz (98.34 kg).   Physical Exam   Constitutional: She is oriented to person, place, and time. She appears well-developed and well-nourished. No distress.  HENT:   Head: Normocephalic and atraumatic.   Nose: Nose normal.   Mouth/Throat: No oropharyngeal exudate.  Eyes: Conjunctivae and EOM are normal. Pupils are equal, round, and reactive to light. Left eye exhibits no discharge. No scleral icterus.  Neck: Neck supple. No JVD present. No tracheal deviation present. No thyromegaly present.  Cardiovascular: Normal rate, regular rhythm, normal heart sounds and intact distal pulses.    No murmur heard. Pulmonary/Chest: Effort normal and breath sounds normal. No respiratory distress. She has no wheezes. She has no rales. She exhibits no tenderness.  Large breasts, size 42-H by history.       In the right breast, lower outer quadrant, 7:30 position, about 3 cm above the inframammary crease there is a 3 x 1.5 cm area of dense, rubbery tissue.     No overlying skin change.   Not obviously neoplastic.   No other mass in either breast.      Chronic telangiectasias of both breasts.     No nipple discharge.     No axillary adenopathy.  Abdominal: Soft. Bowel  sounds are normal. She exhibits no distension and no mass. There is no tenderness. There is no rebound and no guarding.  Musculoskeletal: She exhibits no edema and no tenderness.  Lymphadenopathy:    She has no cervical adenopathy.  Neurological: She is alert and oriented to person, place, and time. She exhibits normal  muscle tone. Coordination normal.  Skin: Skin is warm. No rash noted. She is not diaphoretic. No erythema. No pallor.  Psychiatric: She has a normal mood and affect. Her behavior is normal. Judgment and thought content normal.      Data Reviewed Mammogram and ultrasound. Office notes from [facility] in [place]    Right breast mass, lower outer quadrant. Indeterminate but probably and hopefully benign   Family history of breast cancer in mother, recent, and multiple other third line relatives, remote    Obesity   anxiety     Plan    I discussed the differential diagnosis with her. Discussed different options. Very clearly she was to go ahead and have this area conservatively excised because she thinks is a new finding and she does have a family history of breast cancer. I agree with her.   She will be scheduled for excision of right breast mass under John anesthesia in the near future.   I discussed the indications, details techniques the numerous risks of the surgery with her. She is aware of the risk of bleeding, infection, cosmetic deformity, skin necrosis, nerve damage chronic pain, reoperation if this is cancer, and other unforseen  problems.. She understands all these issues. All her questions were answered. She agrees with this plan.           Edsel Petrin. Dalbert Batman, M.D., [facility] General and Minimally invasive Surgery Breast and Colorectal Surgery Office:   [phone] Pager:   [phone]

## 2013-11-08 ENCOUNTER — Encounter (HOSPITAL_BASED_OUTPATIENT_CLINIC_OR_DEPARTMENT_OTHER): Payer: BC Managed Care – PPO | Admitting: Anesthesiology

## 2013-11-08 ENCOUNTER — Encounter (HOSPITAL_BASED_OUTPATIENT_CLINIC_OR_DEPARTMENT_OTHER): Payer: Self-pay | Admitting: *Deleted

## 2013-11-08 ENCOUNTER — Encounter (HOSPITAL_BASED_OUTPATIENT_CLINIC_OR_DEPARTMENT_OTHER)
Admission: RE | Disposition: A | Discharge status: Home / Self Care | Payer: Self-pay | Source: Ambulatory Visit | Attending: General Surgery

## 2013-11-08 ENCOUNTER — Ambulatory Visit (HOSPITAL_BASED_OUTPATIENT_CLINIC_OR_DEPARTMENT_OTHER)
Admission: RE | Admit: 2013-11-08 | Discharge: 2013-11-08 | Disposition: A | Discharge status: Home / Self Care | Payer: BC Managed Care – PPO | Source: Ambulatory Visit | Attending: General Surgery | Admitting: General Surgery

## 2013-11-08 ENCOUNTER — Ambulatory Visit (HOSPITAL_BASED_OUTPATIENT_CLINIC_OR_DEPARTMENT_OTHER): Payer: BC Managed Care – PPO | Admitting: Anesthesiology

## 2013-11-08 DIAGNOSIS — N6310 Unspecified lump in the right breast, unspecified quadrant: Secondary | ICD-10-CM | POA: Diagnosis present

## 2013-11-08 DIAGNOSIS — Z6833 Body mass index (BMI) 33.0-33.9, adult: Secondary | ICD-10-CM | POA: Insufficient documentation

## 2013-11-08 DIAGNOSIS — N6019 Diffuse cystic mastopathy of unspecified breast: Secondary | ICD-10-CM | POA: Insufficient documentation

## 2013-11-08 DIAGNOSIS — Z803 Family history of malignant neoplasm of breast: Secondary | ICD-10-CM | POA: Insufficient documentation

## 2013-11-08 DIAGNOSIS — Z7982 Long term (current) use of aspirin: Secondary | ICD-10-CM | POA: Insufficient documentation

## 2013-11-08 DIAGNOSIS — Z888 Allergy status to other drugs, medicaments and biological substances status: Secondary | ICD-10-CM | POA: Insufficient documentation

## 2013-11-08 DIAGNOSIS — G43909 Migraine, unspecified, not intractable, without status migrainosus: Secondary | ICD-10-CM | POA: Insufficient documentation

## 2013-11-08 DIAGNOSIS — E669 Obesity, unspecified: Secondary | ICD-10-CM | POA: Insufficient documentation

## 2013-11-08 DIAGNOSIS — D249 Benign neoplasm of unspecified breast: Secondary | ICD-10-CM

## 2013-11-08 DIAGNOSIS — F411 Generalized anxiety disorder: Secondary | ICD-10-CM | POA: Insufficient documentation

## 2013-11-08 DIAGNOSIS — Z79899 Other long term (current) drug therapy: Secondary | ICD-10-CM | POA: Insufficient documentation

## 2013-11-08 HISTORY — DX: Personal history of other specified conditions: Z87.898

## 2013-11-08 HISTORY — PX: BREAST BIOPSY: SHX20

## 2013-11-08 LAB — POCT HEMOGLOBIN-HEMACUE: Hemoglobin: 14.9 g/dL (ref 12.0–15.0)

## 2013-11-08 SURGERY — BREAST BIOPSY
Anesthesia: General | Site: Breast | Laterality: Right

## 2013-11-08 MED ORDER — LACTATED RINGERS IV SOLN
INTRAVENOUS | Status: DC
  Administered 2013-11-08 (×2): via INTRAVENOUS

## 2013-11-08 MED ORDER — ACETAMINOPHEN 650 MG RE SUPP: 650.0000 mg | RECTAL | Status: DC | PRN

## 2013-11-08 MED ORDER — ONDANSETRON HCL 4 MG/2ML IJ SOLN
INTRAMUSCULAR | Status: DC | PRN
  Administered 2013-11-08: 4 mg via INTRAVENOUS

## 2013-11-08 MED ORDER — CEFAZOLIN SODIUM-DEXTROSE 2-3 GM-% IV SOLR
2.0000 g | INTRAVENOUS | Status: AC
  Administered 2013-11-08: 2 g via INTRAVENOUS

## 2013-11-08 MED ORDER — 0.9 % SODIUM CHLORIDE (POUR BTL) OPTIME
TOPICAL | Status: DC | PRN
  Administered 2013-11-08: 600 mL

## 2013-11-08 MED ORDER — SODIUM CHLORIDE 0.9 % IV SOLN: INTRAVENOUS | Status: DC

## 2013-11-08 MED ORDER — FENTANYL CITRATE 0.05 MG/ML IJ SOLN: 50.0000 ug | INTRAMUSCULAR | Status: DC | PRN

## 2013-11-08 MED ORDER — OXYCODONE HCL 5 MG PO TABS
5.0000 mg | ORAL_TABLET | Freq: Once | ORAL | Status: AC | PRN
  Administered 2013-11-08: 5 mg via ORAL

## 2013-11-08 MED ORDER — MIDAZOLAM HCL 2 MG/2ML IJ SOLN: 1.0000 mg | INTRAMUSCULAR | Status: DC | PRN

## 2013-11-08 MED ORDER — CHLORHEXIDINE GLUCONATE 4 % EX LIQD: 1.0000 "application " | Freq: Once | CUTANEOUS | Status: DC

## 2013-11-08 MED ORDER — FENTANYL CITRATE 0.05 MG/ML IJ SOLN: 25.0000 ug | INTRAMUSCULAR | Status: DC | PRN

## 2013-11-08 MED ORDER — PROPOFOL 10 MG/ML IV BOLUS
INTRAVENOUS | Status: DC | PRN
  Administered 2013-11-08: 200 mg via INTRAVENOUS

## 2013-11-08 MED ORDER — SODIUM CHLORIDE 0.9 % IJ SOLN: 3.0000 mL | Freq: Two times a day (BID) | INTRAMUSCULAR | Status: DC

## 2013-11-08 MED ORDER — DEXAMETHASONE SODIUM PHOSPHATE 4 MG/ML IJ SOLN
INTRAMUSCULAR | Status: DC | PRN
  Administered 2013-11-08: 10 mg via INTRAVENOUS

## 2013-11-08 MED ORDER — OXYCODONE HCL 5 MG/5ML PO SOLN: 5.0000 mg | Freq: Once | ORAL | Status: AC | PRN

## 2013-11-08 MED ORDER — MIDAZOLAM HCL 2 MG/ML PO SYRP: 12.0000 mg | ORAL_SOLUTION | Freq: Once | ORAL | Status: DC | PRN

## 2013-11-08 MED ORDER — PROMETHAZINE HCL 25 MG/ML IJ SOLN: 6.2500 mg | INTRAMUSCULAR | Status: DC | PRN

## 2013-11-08 MED ORDER — MIDAZOLAM HCL 2 MG/2ML IJ SOLN
INTRAMUSCULAR | Status: AC
  Filled 2013-11-08: qty 2

## 2013-11-08 MED ORDER — ACETAMINOPHEN 325 MG PO TABS: 650.0000 mg | ORAL_TABLET | ORAL | Status: DC | PRN

## 2013-11-08 MED ORDER — LIDOCAINE HCL (CARDIAC) 20 MG/ML IV SOLN
INTRAVENOUS | Status: DC | PRN
  Administered 2013-11-08: 60 mg via INTRAVENOUS

## 2013-11-08 MED ORDER — FENTANYL CITRATE 0.05 MG/ML IJ SOLN
INTRAMUSCULAR | Status: AC
  Filled 2013-11-08: qty 6

## 2013-11-08 MED ORDER — CEFAZOLIN SODIUM-DEXTROSE 2-3 GM-% IV SOLR
INTRAVENOUS | Status: AC
  Filled 2013-11-08: qty 50

## 2013-11-08 MED ORDER — OXYCODONE HCL 5 MG PO TABS: 5.0000 mg | ORAL_TABLET | ORAL | Status: DC | PRN

## 2013-11-08 MED ORDER — BUPIVACAINE-EPINEPHRINE (PF) 0.5% -1:200000 IJ SOLN
INTRAMUSCULAR | Status: AC
  Filled 2013-11-08: qty 30

## 2013-11-08 MED ORDER — SODIUM CHLORIDE 0.9 % IJ SOLN: 3.0000 mL | INTRAMUSCULAR | Status: DC | PRN

## 2013-11-08 MED ORDER — BUPIVACAINE-EPINEPHRINE 0.5% -1:200000 IJ SOLN
INTRAMUSCULAR | Status: DC | PRN
  Administered 2013-11-08: 20 mL

## 2013-11-08 MED ORDER — MIDAZOLAM HCL 5 MG/5ML IJ SOLN
INTRAMUSCULAR | Status: DC | PRN
  Administered 2013-11-08: 2 mg via INTRAVENOUS

## 2013-11-08 MED ORDER — FENTANYL CITRATE 0.05 MG/ML IJ SOLN
INTRAMUSCULAR | Status: DC | PRN
  Administered 2013-11-08 (×2): 50 ug via INTRAVENOUS

## 2013-11-08 MED ORDER — HYDROCODONE-ACETAMINOPHEN 5-325 MG PO TABS: 1.0000 | ORAL_TABLET | Freq: Four times a day (QID) | ORAL | Status: DC | PRN

## 2013-11-08 MED ORDER — SODIUM CHLORIDE 0.9 % IV SOLN: 250.0000 mL | INTRAVENOUS | Status: DC | PRN

## 2013-11-08 MED ORDER — HYDROMORPHONE HCL PF 1 MG/ML IJ SOLN: 0.2500 mg | INTRAMUSCULAR | Status: DC | PRN

## 2013-11-08 MED ORDER — OXYCODONE HCL 5 MG PO TABS
ORAL_TABLET | ORAL | Status: AC
  Filled 2013-11-08: qty 1

## 2013-11-08 SURGICAL SUPPLY — 63 items
ADH SKN CLS APL DERMABOND .7 (GAUZE/BANDAGES/DRESSINGS) ×1
APL SKNCLS STERI-STRIP NONHPOA (GAUZE/BANDAGES/DRESSINGS)
APPLIER CLIP 9.375 MED OPEN (MISCELLANEOUS)
APR CLP MED 9.3 20 MLT OPN (MISCELLANEOUS)
BANDAGE ELASTIC 6 VELCRO ST LF (GAUZE/BANDAGES/DRESSINGS) IMPLANT
BENZOIN TINCTURE PRP APPL 2/3 (GAUZE/BANDAGES/DRESSINGS) IMPLANT
BINDER BREAST 3XL (BIND) ×2 IMPLANT
BLADE 15 SAFETY STRL DISP (BLADE) ×2 IMPLANT
CANISTER SUCT 1200ML W/VALVE (MISCELLANEOUS) ×3 IMPLANT
CHLORAPREP W/TINT 26ML (MISCELLANEOUS) ×3 IMPLANT
CLIP APPLIE 9.375 MED OPEN (MISCELLANEOUS) ×1 IMPLANT
CLOSURE WOUND 1/2 X4 (GAUZE/BANDAGES/DRESSINGS)
COVER MAYO STAND STRL (DRAPES) ×3 IMPLANT
COVER TABLE BACK 60X90 (DRAPES) ×3 IMPLANT
DECANTER SPIKE VIAL GLASS SM (MISCELLANEOUS) IMPLANT
DERMABOND ADVANCED (GAUZE/BANDAGES/DRESSINGS) ×2
DERMABOND ADVANCED .7 DNX12 (GAUZE/BANDAGES/DRESSINGS) IMPLANT
DEVICE DUBIN W/COMP PLATE 8390 (MISCELLANEOUS) IMPLANT
DRAPE LAPAROSCOPIC ABDOMINAL (DRAPES) ×2 IMPLANT
DRAPE LAPAROTOMY TRNSV 102X78 (DRAPE) IMPLANT
DRAPE PED LAPAROTOMY (DRAPES) ×1 IMPLANT
DRAPE UTILITY XL STRL (DRAPES) ×3 IMPLANT
ELECT REM PT RETURN 9FT ADLT (ELECTROSURGICAL) ×3
ELECTRODE REM PT RTRN 9FT ADLT (ELECTROSURGICAL) ×1 IMPLANT
GAUZE SPONGE 4X4 16PLY XRAY LF (GAUZE/BANDAGES/DRESSINGS) IMPLANT
GLOVE BIOGEL PI IND STRL 6.5 (GLOVE) IMPLANT
GLOVE BIOGEL PI INDICATOR 6.5 (GLOVE) ×2
GLOVE ECLIPSE 6.5 STRL STRAW (GLOVE) ×2 IMPLANT
GLOVE EUDERMIC 7 POWDERFREE (GLOVE) ×5 IMPLANT
GLOVE EXAM NITRILE LRG STRL (GLOVE) ×2 IMPLANT
GOWN STRL REUS W/ TWL LRG LVL3 (GOWN DISPOSABLE) ×1 IMPLANT
GOWN STRL REUS W/ TWL XL LVL3 (GOWN DISPOSABLE) ×1 IMPLANT
GOWN STRL REUS W/TWL LRG LVL3 (GOWN DISPOSABLE) ×3
GOWN STRL REUS W/TWL XL LVL3 (GOWN DISPOSABLE) ×3
KIT MARKER MARGIN INK (KITS) IMPLANT
NDL HYPO 25X1 1.5 SAFETY (NEEDLE) ×1 IMPLANT
NEEDLE HYPO 22GX1.5 SAFETY (NEEDLE) ×2 IMPLANT
NEEDLE HYPO 25X1 1.5 SAFETY (NEEDLE) ×3 IMPLANT
NS IRRIG 1000ML POUR BTL (IV SOLUTION) ×3 IMPLANT
PACK BASIN DAY SURGERY FS (CUSTOM PROCEDURE TRAY) ×3 IMPLANT
PENCIL BUTTON HOLSTER BLD 10FT (ELECTRODE) ×3 IMPLANT
SLEEVE SCD COMPRESS KNEE MED (MISCELLANEOUS) ×2 IMPLANT
SPONGE GAUZE 4X4 12PLY STER LF (GAUZE/BANDAGES/DRESSINGS) IMPLANT
SPONGE LAP 4X18 X RAY DECT (DISPOSABLE) ×3 IMPLANT
STAPLER VISISTAT 35W (STAPLE) IMPLANT
STRIP CLOSURE SKIN 1/2X4 (GAUZE/BANDAGES/DRESSINGS) IMPLANT
SUT ETHILON 4 0 PS 2 18 (SUTURE) IMPLANT
SUT MNCRL AB 4-0 PS2 18 (SUTURE) ×2 IMPLANT
SUT SILK 2 0 SH (SUTURE) ×5 IMPLANT
SUT VIC AB 2-0 SH 27 (SUTURE)
SUT VIC AB 2-0 SH 27XBRD (SUTURE) IMPLANT
SUT VIC AB 3-0 FS2 27 (SUTURE) IMPLANT
SUT VIC AB 4-0 P-3 18XBRD (SUTURE) IMPLANT
SUT VIC AB 4-0 P3 18 (SUTURE)
SUT VICRYL 3-0 CR8 SH (SUTURE) ×3 IMPLANT
SUT VICRYL 4-0 PS2 18IN ABS (SUTURE) IMPLANT
SYR BULB 3OZ (MISCELLANEOUS) IMPLANT
SYR CONTROL 10ML LL (SYRINGE) ×3 IMPLANT
TAPE HYPAFIX 4 X10 (GAUZE/BANDAGES/DRESSINGS) IMPLANT
TOWEL OR NON WOVEN STRL DISP B (DISPOSABLE) ×3 IMPLANT
TUBE CONNECTING 20'X1/4 (TUBING) ×1
TUBE CONNECTING 20X1/4 (TUBING) ×2 IMPLANT
YANKAUER SUCT BULB TIP NO VENT (SUCTIONS) ×3 IMPLANT

## 2013-11-08 NOTE — Transfer of Care (Signed)
Immediate Anesthesia Transfer of Care Note  Patient: Holly Wiley  Procedure(s) Performed: Procedure(s): EXCISION OF RIGHT  BREAST MASS (Right)  Patient Location: PACU  Anesthesia Type:General  Level of Consciousness: awake, alert , oriented and patient cooperative  Airway & Oxygen Therapy: Patient Spontanous Breathing and Patient connected to face mask oxygen  Post-op Assessment: Report given to PACU RN and Post -op Vital signs reviewed and stable  Post vital signs: Reviewed and stable  Complications: No apparent anesthesia complications

## 2013-11-08 NOTE — Discharge Instructions (Signed)
[facility] °Office Phone Number [phone] ° °BREAST BIOPSY/ PARTIAL MASTECTOMY: POST OP INSTRUCTIONS ° °Always review your discharge instruction sheet given to you by the facility where your surgery was performed. ° °IF YOU HAVE DISABILITY OR FAMILY LEAVE FORMS, YOU MUST BRING THEM TO THE OFFICE FOR PROCESSING.  DO NOT GIVE THEM TO YOUR DOCTOR. ° °1. A prescription for pain medication may be given to you upon discharge.  Take your pain medication as prescribed, if needed.  If narcotic pain medicine is not needed, then you may take acetaminophen (Tylenol) or ibuprofen (Advil) as needed. °2. Take your usually prescribed medications unless otherwise directed °3. If you need a refill on your pain medication, please contact your pharmacy.  They will contact our office to request authorization.  Prescriptions will not be filled after 5pm or on week-ends. °4. You should eat very light the first 24 hours after surgery, such as soup, crackers, pudding, etc.  Resume your normal diet the day after surgery. °5. Most patients will experience some swelling and bruising in the breast.  Ice packs and a good support bra will help.  Swelling and bruising can take several days to resolve.  °6. It is common to experience some constipation if taking pain medication after surgery.  Increasing fluid intake and taking a stool softener will usually help or prevent this problem from occurring.  A mild laxative (Milk of Magnesia or Miralax) should be taken according to package directions if there are no bowel movements after 48 hours. °7. Unless discharge instructions indicate otherwise, you may remove your bandages 24-48 hours after surgery, and you may shower at that time.  You may have steri-strips (small skin tapes) in place directly over the incision.  These strips should be left on the skin for 7-10 days.  If your surgeon used skin glue on the incision, you may shower in 24 hours.  The glue will flake off over the  next 2-3 weeks.  Any sutures or staples will be removed at the office during your follow-up visit. °8. ACTIVITIES:  You may resume regular daily activities (gradually increasing) beginning the next day.  Wearing a good support bra or sports bra minimizes pain and swelling.  You may have sexual intercourse when it is comfortable. °a. You may drive when you no longer are taking prescription pain medication, you can comfortably wear a seatbelt, and you can safely maneuver your car and apply brakes. °b. RETURN TO WORK:  ______________________________________________________________________________________ °9. You should see your doctor in the office for a follow-up appointment approximately two weeks after your surgery.  Your doctor’s nurse will typically make your follow-up appointment when she calls you with your pathology report.  Expect your pathology report 2-3 business days after your surgery.  You may call to check if you do not hear from us after three days. °10. OTHER INSTRUCTIONS: _______________________________________________________________________________________________ _____________________________________________________________________________________________________________________________________ °_____________________________________________________________________________________________________________________________________ °_____________________________________________________________________________________________________________________________________ ° °WHEN TO CALL YOUR DOCTOR: °1. Fever over 101.0 °2. Nausea and/or vomiting. °3. Extreme swelling or bruising. °4. Continued bleeding from incision. °5. Increased pain, redness, or drainage from the incision. ° °The clinic staff is available to answer your questions during regular business hours.  Please don’t hesitate to call and ask to speak to one of the nurses for clinical concerns.  If you have a medical emergency, go to the nearest  emergency room or call 911.  A surgeon from [facility] is always on call at the hospital. ° °For further questions, please visit [email or web address]  ° ° ° °  Post Anesthesia Home Care Instructions ° °Activity: °Get plenty of rest for the remainder of the day. A responsible adult should stay with you for 24 hours following the procedure.  °For the next 24 hours, DO NOT: °-Drive a car °-Operate machinery °-Drink alcoholic beverages °-Take any medication unless instructed by your physician °-Make any legal decisions or sign important papers. ° °Meals: °Start with liquid foods such as gelatin or soup. Progress to regular foods as tolerated. Avoid greasy, spicy, heavy foods. If nausea and/or vomiting occur, drink only clear liquids until the nausea and/or vomiting subsides. Call your physician if vomiting continues. ° °Special Instructions/Symptoms: °Your throat may feel dry or sore from the anesthesia or the breathing tube placed in your throat during surgery. If this causes discomfort, gargle with warm salt water. The discomfort should disappear within 24 hours. ° ° °Call your surgeon if you experience:  ° °1.  Fever over 101.0. °2.  Inability to urinate. °3.  Nausea and/or vomiting. °4.  Extreme swelling or bruising at the surgical site. °5.  Continued bleeding from the incision. °6.  Increased pain, redness or drainage from the incision. °7.  Problems related to your pain medication. ° ° °

## 2013-11-08 NOTE — Anesthesia Postprocedure Evaluation (Signed)
  Anesthesia Post-op Note  Patient: Holly Wiley  Procedure(s) Performed: Procedure(s): EXCISION OF RIGHT  BREAST MASS (Right)  Patient Location: PACU  Anesthesia Type:General  Level of Consciousness: awake and alert   Airway and Oxygen Therapy: Patient Spontanous Breathing  Post-op Pain: mild  Post-op Assessment: Post-op Vital signs reviewed  Post-op Vital Signs: stable  Last Vitals:  Filed Vitals:   11/08/13 1515  BP: 119/77  Pulse: 75  Temp:   Resp: 20    Complications: No apparent anesthesia complications

## 2013-11-08 NOTE — Op Note (Signed)
Patient Name:           Holly Wiley   Date of Surgery:        11/08/2013  Pre op Diagnosis:      Palpable mass right breast, lower outer quadrant, indeterminate significance  Post op Diagnosis:    Same  Procedure:                 Right breast lumpectomy with margin assessment  Surgeon:                     Edsel Petrin. Dalbert Batman, M.D., FACS  Assistant:                      None  Operative Indications:   Holly Wiley is a 38 y.o. female. She is referred by Dr. Len Childs in [facility] for evaluation of a palpable mass in the right breast.  This patient is the daughter of a breast cancer patient of mine. Her mother recently underwent a bilateral mastectomies for stage I invasive cancer. The mother underwent genetic testing and this was reportedly negative.Marland KitchenMarland KitchenMarland KitchenThere has also been some breast cancer in third line relatives on both maternal and paternal side.  She states that 6 weeks ago she felt a lump in the right breast, lower outer quadrant. No pain. Unchanged. Recent mammograms and ultrasound were done in [place] and were read by Dr. Lovey Newcomer and there was no imaging abnormality. Because of her family history she is highly concerned and was referred for consideration of biopsy. Examination reveals that her breasts are very large. In the lower outer quadrant, a few centimeters above the inframammary crease, there is a 2 cm area of palpable thickening. There is no skin change. It is difficult to tell whether this is pathologic or fibrocystic disease or fat condensation. Comorbidities include obesity, anxiety on Effexor.    Operative Findings:       With the patient supine in her breast rotated to the left we could palpate the area of palpable thickening in the lower outer quadrant. No skin change.  Procedure in Detail:          Following the induction of general anesthesia the patient's right breast was prepped and draped in a sterile fashion. Surgical time out was performed. Intravenous antibiotics  were given. 0.5% Marcaine with epinephrine was used for local infiltration anesthetic. Using the area that was marked in the preop area with with the patient, I made a transverse incision in the right breast, lower outer quadrant, but parallel to the areolar margin. Dissection was carried deeply into the breast tissue where I removed the specimen of breast tissue the size of a large goose egg. The palpable thickening seem to be contained with this, but there was no clearly pathologic mass. Silk sutures were placed at 3 cardinal positions of the specimen to orient the pathologist for margin assessment. Hemostasis was excellent and achieved with electrocautery. Wound irrigated with saline. Multiple layers of breast tissue were closed with interrupted sutures of 3-0 Vicryl and the skin closed with a running subcuticular suture of 4-0 Monocryl and Dermabond. A sponge was placed and the patient taken to PACU in stable condition. EBL 15 cc. Counts correct. Complications none.     Edsel Petrin. Dalbert Batman, M.D., FACS General and Minimally Invasive Surgery Breast and Colorectal Surgery  11/08/2013 2:30 PM

## 2013-11-08 NOTE — Anesthesia Preprocedure Evaluation (Addendum)
Anesthesia Evaluation  Patient identified by MRN, date of birth, ID band Patient awake    Reviewed: Allergy & Precautions, H&P , NPO status , Patient's Chart, lab work & pertinent test results  History of Anesthesia Complications Negative for: history of anesthetic complications  Airway Mallampati: I      Dental no notable dental hx. (+) Teeth Intact   Pulmonary neg pulmonary ROS,  breath sounds clear to auscultation  Pulmonary exam normal       Cardiovascular negative cardio ROS  IRhythm:regular Rate:Normal     Neuro/Psych negative neurological ROS  negative psych ROS   GI/Hepatic negative GI ROS, Neg liver ROS,   Endo/Other  negative endocrine ROS  Renal/GU negative Renal ROS  negative genitourinary   Musculoskeletal   Abdominal   Peds  Hematology negative hematology ROS (+)   Anesthesia Other Findings   Reproductive/Obstetrics negative OB ROS                           Anesthesia Physical Anesthesia Plan  ASA: I  Anesthesia Plan: General and General LMA   Post-op Pain Management:    Induction: Intravenous  Airway Management Planned: LMA  Additional Equipment:   Intra-op Plan:   Post-operative Plan: Extubation in OR  Informed Consent: I have reviewed the patients History and Physical, chart, labs and discussed the procedure including the risks, benefits and alternatives for the proposed anesthesia with the patient or authorized representative who has indicated his/her understanding and acceptance.     Plan Discussed with: CRNA and Surgeon  Anesthesia Plan Comments:        Anesthesia Quick Evaluation

## 2013-11-08 NOTE — Anesthesia Procedure Notes (Signed)
Procedure Name: LMA Insertion Date/Time: 11/08/2013 1:57 PM Performed by: Toula Moos L Pre-anesthesia Checklist: Patient identified, Emergency Drugs available, Suction available, Patient being monitored and Timeout performed Patient Re-evaluated:Patient Re-evaluated prior to inductionOxygen Delivery Method: Circle System Utilized Preoxygenation: Pre-oxygenation with 100% oxygen Intubation Type: IV induction Ventilation: Mask ventilation without difficulty LMA: LMA inserted LMA Size: 4.0 Number of attempts: 1 Airway Equipment and Method: bite block Placement Confirmation: positive ETCO2 and breath sounds checked- equal and bilateral Tube secured with: Tape Dental Injury: Teeth and Oropharynx as per pre-operative assessment

## 2013-11-08 NOTE — Interval H&P Note (Signed)
History and Physical Interval Note:  11/08/2013 1:22 PM  Holly Wiley  has presented today for surgery, with the diagnosis of right breast mass  The various methods of treatment have been discussed with the patient and family. After consideration of risks, benefits and other options for treatment, the patient has consented to  Procedure(s): EXCISION OF RIGHT  BREAST MASS (Right) as a surgical intervention .  The patient's history has been reviewed, patient examined today , no change in status, stable for surgery.  I have reviewed the patient's chart and labs.  Questions were answered to the patient's satisfaction.     Adin Hector

## 2013-11-09 ENCOUNTER — Encounter (HOSPITAL_BASED_OUTPATIENT_CLINIC_OR_DEPARTMENT_OTHER): Payer: Self-pay | Admitting: General Surgery

## 2013-11-10 NOTE — Progress Notes (Signed)
Quick Note:  Inform patient of Pathology report,.Tell her that there is no evidence of cancer. Excellent news. I will discuss in detail at her next office visit.  hmi ______

## 2013-11-11 ENCOUNTER — Telehealth (INDEPENDENT_AMBULATORY_CARE_PROVIDER_SITE_OTHER): Payer: Self-pay

## 2013-11-11 NOTE — Telephone Encounter (Signed)
Per Dr Darrel Hoover request pt advised of path result. Pt to keep appt next week.

## 2013-11-19 ENCOUNTER — Encounter (INDEPENDENT_AMBULATORY_CARE_PROVIDER_SITE_OTHER): Payer: Self-pay | Admitting: General Surgery

## 2013-11-19 ENCOUNTER — Ambulatory Visit (INDEPENDENT_AMBULATORY_CARE_PROVIDER_SITE_OTHER): Payer: BC Managed Care – PPO | Admitting: General Surgery

## 2013-11-19 VITALS — BP 112/74 | HR 88 | Temp 98.0°F | Resp 16 | Ht 66.0 in | Wt 214.6 lb

## 2013-11-19 DIAGNOSIS — N62 Hypertrophy of breast: Secondary | ICD-10-CM

## 2013-11-19 DIAGNOSIS — N63 Unspecified lump in unspecified breast: Secondary | ICD-10-CM

## 2013-11-19 DIAGNOSIS — N6310 Unspecified lump in the right breast, unspecified quadrant: Secondary | ICD-10-CM

## 2013-11-19 HISTORY — DX: Hypertrophy of breast: N62

## 2013-11-19 NOTE — Patient Instructions (Signed)
You are recovering from your right breast lumpectomy without any obvious surgical complications.  We have discussed your  final pathology report, which shows benign fibrocystic disease and a tight hyperplasia.  I recommend that you get bilateral mammograms in 1 year and have your primary care physician examining her breast in 1 year.  Return to see Dr. Dalbert Batman as needed.  Because of your very large, very heavy breasts, we will be happy to refer you to a plastic surgeon to consider breast reduction surgery, if you desire.

## 2013-11-19 NOTE — Progress Notes (Signed)
Patient ID: Holly Wiley, female   DOB: [birth year], 38 y.o.   MRN: [id number] History: This patient underwent right lumpectomy for a palpable mass in the right breast laterally on 11/08/2012. Final pathology report shows fibrocystic disease and pseudo-angiomatous stromal hyperplasia. No atypia. No malignancy. She says she is feeling very well and feels good. Her breasts are quite large, heavy, and very uncomfortable for her. She is interested in  breast reduction  Exam  right breast is examined. It is quite large. The incision laterally is healing nicely. No hematoma or infection.  Assessment: Palpable mass right breast, recovering uneventfully following right lumpectomy. Benign pathology as described above. Fibrocystic disease. [place].  macromastia  Plan:   advised mammography in one year and breast exam by PCP at that time I told her we would refer her to a plastic surgeon if and when she is ready to consider reduction mammoplasty. Return to see me as needed   Edsel Petrin. Dalbert Batman, M.D., [facility] General and Minimally invasive Surgery Breast and Colorectal Surgery Office:   [phone] Pager:   [phone]

## 2014-04-28 ENCOUNTER — Ambulatory Visit: Payer: No Typology Code available for payment source | Admitting: Obstetrics & Gynecology

## 2014-05-19 DIAGNOSIS — M25512 Pain in left shoulder: Secondary | ICD-10-CM

## 2014-05-19 HISTORY — DX: Pain in left shoulder: M25.512

## 2014-06-27 ENCOUNTER — Encounter: Payer: Self-pay | Admitting: *Deleted

## 2014-06-28 ENCOUNTER — Encounter: Payer: Self-pay | Admitting: Obstetrics & Gynecology

## 2014-07-12 ENCOUNTER — Telehealth: Payer: Self-pay

## 2014-07-12 NOTE — Telephone Encounter (Signed)
printed and mailed patient's records for 04/26/13 visit with Dr. Delsa Sale as requested

## 2016-04-10 ENCOUNTER — Other Ambulatory Visit: Payer: Self-pay | Admitting: Obstetrics and Gynecology

## 2016-04-10 DIAGNOSIS — Z1239 Encounter for other screening for malignant neoplasm of breast: Secondary | ICD-10-CM

## 2016-04-11 ENCOUNTER — Ambulatory Visit
Admission: RE | Admit: 2016-04-11 | Discharge: 2016-04-11 | Disposition: A | Discharge status: Home / Self Care | Payer: BLUE CROSS/BLUE SHIELD | Source: Ambulatory Visit | Attending: Obstetrics and Gynecology | Admitting: Obstetrics and Gynecology

## 2016-04-11 DIAGNOSIS — Z1239 Encounter for other screening for malignant neoplasm of breast: Secondary | ICD-10-CM

## 2016-04-11 MED ORDER — GADOBENATE DIMEGLUMINE 529 MG/ML IV SOLN
20.0000 mL | Freq: Once | INTRAVENOUS | Status: AC | PRN
  Administered 2016-04-11: 20 mL via INTRAVENOUS

## 2017-02-26 HISTORY — PX: TENOTOMY FOREARM / WRIST: SUR1342

## 2017-02-26 HISTORY — PX: CARPAL TUNNEL RELEASE: SHX101

## 2017-02-27 ENCOUNTER — Observation Stay (HOSPITAL_COMMUNITY)
Admission: EM | Admit: 2017-02-27 | Discharge: 2017-02-28 | Disposition: A | Discharge status: Home / Self Care | Payer: BLUE CROSS/BLUE SHIELD | Attending: Orthopedic Surgery | Admitting: Orthopedic Surgery

## 2017-02-27 ENCOUNTER — Encounter (HOSPITAL_COMMUNITY): Payer: Self-pay | Admitting: Emergency Medicine

## 2017-02-27 DIAGNOSIS — M25521 Pain in right elbow: Principal | ICD-10-CM | POA: Insufficient documentation

## 2017-02-27 DIAGNOSIS — M7711 Lateral epicondylitis, right elbow: Secondary | ICD-10-CM

## 2017-02-27 DIAGNOSIS — Z96621 Presence of right artificial elbow joint: Secondary | ICD-10-CM

## 2017-02-27 DIAGNOSIS — Z471 Aftercare following joint replacement surgery: Secondary | ICD-10-CM

## 2017-02-27 HISTORY — DX: Aftercare following joint replacement surgery: Z47.1

## 2017-02-27 MED ORDER — HYDROMORPHONE HCL 1 MG/ML IJ SOLN
0.5000 mg | INTRAMUSCULAR | Status: DC | PRN
  Administered 2017-02-27 – 2017-02-28 (×5): 1 mg via INTRAVENOUS
  Filled 2017-02-27 (×5): qty 1

## 2017-02-27 MED ORDER — OXYCODONE HCL 5 MG PO TABS: 5.0000 mg | ORAL_TABLET | ORAL | Status: DC | PRN

## 2017-02-27 MED ORDER — METHOCARBAMOL 500 MG PO TABS: 500.0000 mg | ORAL_TABLET | Freq: Four times a day (QID) | ORAL | Status: DC | PRN

## 2017-02-27 MED ORDER — VENLAFAXINE HCL ER 75 MG PO CP24
75.0000 mg | ORAL_CAPSULE | Freq: Every day | ORAL | Status: DC
  Filled 2017-02-27: qty 1

## 2017-02-27 MED ORDER — ADULT MULTIVITAMIN W/MINERALS CH
1.0000 | ORAL_TABLET | Freq: Every day | ORAL | Status: DC
  Administered 2017-02-28: 1 via ORAL
  Filled 2017-02-27: qty 1

## 2017-02-27 MED ORDER — METHOCARBAMOL 1000 MG/10ML IJ SOLN
500.0000 mg | Freq: Four times a day (QID) | INTRAMUSCULAR | Status: DC | PRN
  Filled 2017-02-27 (×2): qty 5

## 2017-02-27 MED ORDER — VITAMIN C 500 MG PO TABS
1000.0000 mg | ORAL_TABLET | Freq: Every day | ORAL | Status: DC
  Administered 2017-02-28: 1000 mg via ORAL
  Filled 2017-02-27: qty 2

## 2017-02-27 MED ORDER — HYDROCODONE-ACETAMINOPHEN 10-325 MG PO TABS
1.0000 | ORAL_TABLET | ORAL | Status: DC | PRN
  Administered 2017-02-28 (×3): 2 via ORAL
  Filled 2017-02-27 (×3): qty 2

## 2017-02-27 MED ORDER — PROMETHAZINE HCL 12.5 MG RE SUPP
12.5000 mg | Freq: Four times a day (QID) | RECTAL | Status: DC | PRN
  Administered 2017-02-28 (×2): 12.5 mg via RECTAL
  Filled 2017-02-27 (×3): qty 1

## 2017-02-27 MED ORDER — POTASSIUM CHLORIDE IN NACL 20-0.9 MEQ/L-% IV SOLN
INTRAVENOUS | Status: DC
  Administered 2017-02-28: 01:00:00 via INTRAVENOUS
  Filled 2017-02-27: qty 1000

## 2017-02-27 MED ORDER — ONDANSETRON HCL 4 MG PO TABS: 4.0000 mg | ORAL_TABLET | Freq: Four times a day (QID) | ORAL | Status: DC | PRN

## 2017-02-27 MED ORDER — DIPHENHYDRAMINE HCL 25 MG PO CAPS: 25.0000 mg | ORAL_CAPSULE | Freq: Four times a day (QID) | ORAL | Status: DC | PRN

## 2017-02-27 MED ORDER — ONDANSETRON HCL 4 MG/2ML IJ SOLN
4.0000 mg | Freq: Four times a day (QID) | INTRAMUSCULAR | Status: DC | PRN
  Administered 2017-02-27 – 2017-02-28 (×2): 4 mg via INTRAVENOUS
  Filled 2017-02-27 (×2): qty 2

## 2017-02-27 MED ORDER — ALPRAZOLAM 0.5 MG PO TABS: 0.5000 mg | ORAL_TABLET | Freq: Four times a day (QID) | ORAL | Status: DC | PRN

## 2017-02-27 NOTE — H&P (Signed)
DELILAH MULGREW is an 41 y.o. female.   Chief Complaint: Right arm pain HPI: pt had surgery yesterday,pt's pain not controlled on po pain meds Have been in communication with patient all day Pt went to [facility] tonight for evaluation  Past Medical History:  Diagnosis Date  . Breast mass, right 10/2013  . 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 Migraines     Past Surgical History:  Procedure Laterality Date  . BREAST BIOPSY Right 11/08/2013   Procedure: EXCISION OF RIGHT  BREAST MASS;  Surgeon: Adin Hector, MD;  Location: [place];  Service: General;  Laterality: Right;  . CERVICAL CONE BIOPSY  05/2005  . COLONOSCOPY  2014  . FOOT SURGERY Left 2009  . ROTATOR CUFF REPAIR Left 1997    Family History  Problem Relation Age of Onset  . Cancer Mother        breast  . Cancer Maternal Aunt   . Diabetes Maternal Aunt   . Colon cancer Maternal Uncle   . Diabetes Maternal Grandmother   . Stroke Paternal Grandfather    Social History:  reports that she has never smoked. She has never used smokeless tobacco. She reports that she drinks alcohol. She reports that she does not use drugs.  Allergies:  Allergies  Allergen Reactions  . Bee Venom Anaphylaxis  . Imitrex [Sumatriptan] Other (See Comments)    SEIZURE-LIKE ACTIVITY     (Not in a hospital admission)  No results found for this or any previous visit (from the past 48 hour(s)). No results found.  ROSno recent illnesses  Blood pressure 95/61, pulse 88, temperature 97.6 F (36.4 C), temperature source Oral, resp. rate 18, height 5\' 6"  (1.676 m), weight 214 lb (97.1 kg), last menstrual period 02/06/2017, SpO2 96 %. Physical Exam  General Appearance:  Alert, cooperative, no distress, appears stated age, goes from periods of crying to calm Fluctuates during exam  Head:  Normocephalic, without obvious abnormality, atraumatic  Eyes:   Pupils equal, conjunctiva/corneas clear,         Throat: Lips, mucosa, and tongue normal; teeth and gums normal  Neck: No visible masses     Lungs:   respirations unlabored  Chest Wall:  No tenderness or deformity  Heart:  Regular rate and rhythm,  Abdomen:   Soft, non-tender,         Extremities: Right arm: incisions look good, forearm compartments soft, wiggles fingers fingers warm well perfused Able to feel thumb to light touch  Pulses: 2+ and symmetric  Skin: Skin color, texture, turgor normal, no rashes or lesions     Neurologic: Normal    Assessment/Plan Right lateral epicondylitis and carpal tunnel s/p lateral tendon repair and carpal tunnel release Pain not controlled  Admit for pain control Family at bedside Will reassses in am Will try longer oral meds and iv pain medication Pt agrees with plan Orders placed   Kaleb Sek W 02/27/2017, 11:35 PM

## 2017-02-27 NOTE — ED Triage Notes (Signed)
Pt c/o right arm 10/10 pain, had a surgery yesterday and she is not getting any relief from any pain medication prescribed for her.

## 2017-02-27 NOTE — ED Triage Notes (Signed)
Hand @ bedside in triage to see pt

## 2017-02-28 MED ORDER — METOCLOPRAMIDE HCL 5 MG/ML IJ SOLN
10.0000 mg | Freq: Four times a day (QID) | INTRAMUSCULAR | Status: DC | PRN
  Administered 2017-02-28 (×2): 10 mg via INTRAVENOUS
  Filled 2017-02-28: qty 2

## 2017-02-28 MED ORDER — METOCLOPRAMIDE HCL 5 MG/5ML PO SOLN
10.0000 mg | Freq: Four times a day (QID) | ORAL | Status: DC | PRN
  Filled 2017-02-28: qty 10

## 2017-02-28 MED ORDER — KETOROLAC TROMETHAMINE 30 MG/ML IJ SOLN
30.0000 mg | Freq: Four times a day (QID) | INTRAMUSCULAR | Status: DC
  Administered 2017-02-28: 30 mg via INTRAVENOUS
  Filled 2017-02-28: qty 1

## 2017-02-28 MED ORDER — HYDROCODONE-ACETAMINOPHEN 5-325 MG PO TABS
ORAL_TABLET | ORAL | Status: AC
  Administered 2017-02-28: 2
  Filled 2017-02-28: qty 2

## 2017-02-28 MED ORDER — METOCLOPRAMIDE HCL 10 MG PO TABS: 10.0000 mg | ORAL_TABLET | Freq: Four times a day (QID) | ORAL | Status: DC | PRN

## 2017-02-28 NOTE — Discharge Summary (Signed)
Physician Discharge Summary  Patient ID: Holly Wiley MRN: [id number] DOB/AGE: [birth year] 41 y.o.  Admit date: 02/27/2017 Discharge date: 02/28/2017  Admission Diagnoses: * No surgery found * Past Medical History:  Diagnosis Date  . Breast mass, right 10/2013  . 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 Migraines     Discharge Diagnoses:  Active Problems:   Aftercare following right elbow joint replacement surgery   Surgeries:  on     Consultants:   Discharged Condition: Improved  Hospital Course: Holly Wiley is an 41 y.o. female who was admitted 02/27/2017 with a chief complaint of Chief Complaint  Patient presents with  . Post-op Problem  , and found to have a diagnosis of * No surgery found *.  They were brought to the operating room on  and underwent .    They were given perioperative antibiotics: Anti-infectives    None    .  They were given sequential compression devices, early ambulation, and Other (comment) for DVT prophylaxis.  Recent vital signs: Patient Vitals for the past 24 hrs:  BP Temp Temp src Pulse Resp SpO2 Height Weight  02/28/17 1300 114/68 97.6 F (36.4 C) Oral 80 16 96 % - -  02/28/17 0357 111/64 97.7 F (36.5 C) Oral 81 16 95 % - -  02/28/17 0108 117/77 97.9 F (36.6 C) Oral 72 18 100 % - -  02/27/17 2259 95/61 97.6 F (36.4 C) Oral 88 18 96 % 5\' 6"  (1.676 m) 214 lb (97.1 kg)  .  Recent laboratory studies: No results found.  Discharge Medications:     Diagnostic Studies: No results found.  They benefited maximally from their hospital stay and there were no complications.     Disposition: 01-Home or Self Care Discharge Instructions    Discharge patient    Complete by:  As directed    Discharge disposition:  01-Home or Self Care   Discharge patient date:  02/28/2017        Signed: Linna Hoff 02/28/2017, 7:27 PM

## 2017-02-28 NOTE — Care Management Note (Signed)
Case Management Note  Patient Details  Name: Holly Wiley MRN: [id number] Date of Birth: 1975/08/24  Subjective/Objective:                 Patient in obs for uncontrolled s/p Right lateral epicondylitis and carpal tunnel s/p lateral tendon repair and carpal tunnel release 8/29. Per nursing continues to have difficulties managing pain this AM. No CM needs identified at this time.    Action/Plan:   Expected Discharge Date:                  Expected Discharge Plan:  Home/Self Care  In-House Referral:     Discharge planning Services  CM Consult  Post Acute Care Choice:    Choice offered to:     DME Arranged:    DME Agency:     HH Arranged:    HH Agency:     Status of Service:  In process, will continue to follow  If discussed at Long Length of Stay Meetings, dates discussed:    Additional Comments:  Carles Collet, RN 02/28/2017, 11:00 AM

## 2017-02-28 NOTE — Progress Notes (Signed)
Patient expressing concerns about level of pain she is experiencing in her surgical arm. Message left for Dr. Caralyn Guile.

## 2017-02-28 NOTE — Progress Notes (Signed)
Pt arrived on the unit with family around 1:50 am. Alert and oriented X4. Pt was oriented to the room. Skin warm and dry. Call bell within reach. Will continue to evaluate and control pain.

## 2017-02-28 NOTE — Progress Notes (Signed)
Dr. Caralyn Guile returned call, see new orders.

## 2017-02-28 NOTE — Discharge Instructions (Signed)
KEEP BANDAGE CLEAN AND DRY °CALL OFFICE FOR F/U APPT [phone] in 14 days °KEEP HAND ELEVATED ABOVE HEART °OK TO APPLY ICE TO OPERATIVE AREA °CONTACT OFFICE IF ANY WORSENING PAIN OR CONCERNS. °

## 2017-08-16 DIAGNOSIS — J111 Influenza due to unidentified influenza virus with other respiratory manifestations: Secondary | ICD-10-CM | POA: Diagnosis not present

## 2017-08-17 DIAGNOSIS — J111 Influenza due to unidentified influenza virus with other respiratory manifestations: Secondary | ICD-10-CM | POA: Diagnosis not present

## 2018-06-10 DIAGNOSIS — M7541 Impingement syndrome of right shoulder: Secondary | ICD-10-CM

## 2018-06-10 HISTORY — DX: Impingement syndrome of right shoulder: M75.41

## 2018-06-29 ENCOUNTER — Inpatient Hospital Stay: Payer: BLUE CROSS/BLUE SHIELD | Attending: Hematology and Oncology | Admitting: Hematology and Oncology

## 2018-06-29 ENCOUNTER — Telehealth: Payer: Self-pay | Admitting: *Deleted

## 2018-06-29 ENCOUNTER — Other Ambulatory Visit: Payer: Self-pay | Admitting: *Deleted

## 2018-06-29 VITALS — BP 123/87 | HR 96 | Temp 98.7°F | Resp 18 | Ht 66.0 in | Wt 200.3 lb

## 2018-06-29 DIAGNOSIS — C50411 Malignant neoplasm of upper-outer quadrant of right female breast: Secondary | ICD-10-CM | POA: Diagnosis present

## 2018-06-29 DIAGNOSIS — Z803 Family history of malignant neoplasm of breast: Secondary | ICD-10-CM

## 2018-06-29 DIAGNOSIS — Z800 Family history of malignant neoplasm of digestive organs: Secondary | ICD-10-CM | POA: Insufficient documentation

## 2018-06-29 DIAGNOSIS — Z171 Estrogen receptor negative status [ER-]: Secondary | ICD-10-CM

## 2018-06-29 HISTORY — DX: Estrogen receptor negative status (ER-): C50.411

## 2018-06-29 HISTORY — DX: Estrogen receptor negative status (ER-): Z17.1

## 2018-06-29 MED ORDER — DEXAMETHASONE 4 MG PO TABS: ORAL_TABLET | ORAL | 0 refills | Status: DC

## 2018-06-29 MED ORDER — LIDOCAINE-PRILOCAINE 2.5-2.5 % EX CREA: TOPICAL_CREAM | CUTANEOUS | 3 refills | Status: DC

## 2018-06-29 MED ORDER — PROCHLORPERAZINE MALEATE 10 MG PO TABS: 10.0000 mg | ORAL_TABLET | Freq: Four times a day (QID) | ORAL | 1 refills | Status: DC | PRN

## 2018-06-29 MED ORDER — LORAZEPAM 0.5 MG PO TABS: 0.5000 mg | ORAL_TABLET | Freq: Every evening | ORAL | 0 refills | Status: DC | PRN

## 2018-06-29 MED ORDER — ONDANSETRON HCL 8 MG PO TABS: 8.0000 mg | ORAL_TABLET | Freq: Two times a day (BID) | ORAL | 1 refills | Status: DC | PRN

## 2018-06-29 NOTE — Progress Notes (Signed)
START ON PATHWAY REGIMEN - Breast     A cycle is every 14 days (cycles 1-4):     Doxorubicin      Cyclophosphamide      Pegfilgrastim-xxxx    A cycle is every 21 days (cycles 5-8):     Paclitaxel      Carboplatin   **Always confirm dose/schedule in your pharmacy ordering system**  Patient Characteristics: Preoperative or Nonsurgical Candidate (Clinical Staging), Neoadjuvant Therapy followed by Surgery, Invasive Disease, Chemotherapy, HER2 Negative/Unknown/Equivocal, ER Negative/Unknown, Platinum Therapy Indicated Therapeutic Status: Preoperative or Nonsurgical Candidate (Clinical Staging) AJCC M Category: cM0 AJCC Grade: G3 Breast Surgical Plan: Neoadjuvant Therapy followed by Surgery ER Status: Negative (-) AJCC 8 Stage Grouping: IB HER2 Status: Negative (-) AJCC T Category: cT1c AJCC N Category: cN0 PR Status: Negative (-) Type of Therapy: Platinum Therapy Indicated Intent of Therapy: Curative Intent, Discussed with Patient 

## 2018-06-29 NOTE — Assessment & Plan Note (Signed)
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 After reviewing the pathology in detail, we proceeded to discuss the different treatment options between surgery, radiation, chemotherapy, antiestrogen therapies.  Recommendation: 1.  Breast conserving surgery with sentinel lymph node biopsy 2.  Adjuvant chemotherapy with dose dense Adriamycin and Cytoxan x4 followed by Taxol weekly x12 3.  Adjuvant radiation therapy Followed by surveillance.  Chemotherapy Counseling: I discussed the risks and benefits of chemotherapy including the risks of nausea/ vomiting, risk of infection from low WBC count, fatigue due to chemo or anemia, bruising or bleeding due to low platelets, mouth sores, loss/ change in taste and decreased appetite. Liver and kidney function will be monitored through out chemotherapy as abnormalities in liver and kidney function may be a side effect of treatment. Cardiac dysfunction due to Adriamycin was discussed in detail. Risk of permanent bone marrow dysfunction and leukemia due to chemo were also discussed.  Patient is also eligible for clinical trial upbeat UPBEAT clinical trial ([place]): Newly diagnosed stage I to III breast cancer patients receiving either adjuvant or neoadjuvant chemotherapy undergo cardiac MRI before treatment and at 24 months along with neurocognitive testing, exercise and disability measures at baseline 3, 12 and 24 months.  Return to clinic after surgery to discuss the final pathology report. Patient will undergo port  placement during surgery.

## 2018-06-29 NOTE — Progress Notes (Signed)
[facility] CONSULT NOTE  Patient Care Team: Tacey Ruiz, NP as PCP - General (General Practice)  CHIEF COMPLAINTS/PURPOSE OF CONSULTATION:  Newly diagnosed breast cancer  HISTORY OF PRESENTING ILLNESS:  Holly Wiley 42 y.o. female is here because of recent diagnosis of right breast cancer.  Patient had a palpable lump around 6 months ago.  She immediately underwent a mammogram and ultrasound at [facility].  It was not felt to be anything significant.  She once again underwent a mammogram evaluation for her annual checkup and there was a 1.1 cm nodule in the right breast at 10 o'clock position.  There were no abnormal lymph nodes.  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  She came in urgently to discuss treatment options.  She is going to see Dr. Dalbert Batman for surgical options. She had made up her mind to undergo bilateral mastectomies and possibly reconstruction.  She will also need genetic testing. 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  I reviewed her records extensively and collaborated the history with the patient.  SUMMARY OF ONCOLOGIC HISTORY:   Malignant neoplasm of upper-outer quadrant of right breast in female, estrogen receptor negative ([place])   06/22/2018 Initial Diagnosis    Screening mammogram detected right breast mass 1.1 cm at 10 o'clock position right breast 12 cm from the nipple, no abnormal lymph nodes, biopsy of the mass revealed IDC with DCIS grade 3, ER 0%, PR 0%, HER-2 -1+ by IHC, Ki-67 40%, T1c N0 stage Ib    06/29/2018 Cancer Staging    Staging form: Breast, AJCC 8th Edition - Clinical stage from 06/29/2018: Stage IB (cT1c, cN0, cM0, G3, ER-, PR-, HER2-) - Signed by Nicholas Lose, MD on 06/29/2018      MEDICAL HISTORY:  Past Medical History:  Diagnosis Date  . Breast mass, right 10/2013  .  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 Migraines     SURGICAL HISTORY: Past Surgical History:  Procedure Laterality Date  . BREAST BIOPSY Right 11/08/2013   Procedure: EXCISION OF RIGHT  BREAST MASS;  Surgeon: Adin Hector, MD;  Location: [facility];  Service: General;  Laterality: Right;  . CERVICAL CONE BIOPSY  05/2005  . COLONOSCOPY  2014  . FOOT SURGERY Left 2009  . ROTATOR CUFF REPAIR Left 1997    SOCIAL HISTORY: Social History   Socioeconomic History  . Marital status: Divorced    Spouse name: Not on file  . Number of children: Not on file  . Years of education: Not on file  . Highest education level: Not on file  Occupational History  . Not on file  Social Needs  . Financial resource strain: Not on file  . Food insecurity:    Worry: Not on file    Inability: Not on file  . Transportation needs:    Medical: Not on file    Non-medical: Not on file  Tobacco Use  . Smoking status: Never Smoker  . Smokeless tobacco: Never Used  Substance and Sexual Activity  . Alcohol use: Yes    Comment: occasionally  . Drug use: No  . Sexual activity: Not on file  Lifestyle  . Physical activity:    Days per week: Not on file    Minutes  per session: Not on file  . Stress: Not on file  Relationships  . Social connections:    Talks on phone: Not on file    Gets together: Not on file    Attends religious service: Not on file    Active member of club or organization: Not on file    Attends meetings of clubs or organizations: Not on file    Relationship status: Not on file  . Intimate partner violence:    Fear of current or ex partner: Not on file    Emotionally abused: Not on file    Physically abused: Not on file    Forced sexual activity: Not on file  Other Topics Concern  . Not on file  Social History Narrative  . Not on file    FAMILY HISTORY: Family History  Problem Relation Age  of Onset  . Cancer Mother        breast  . Cancer Maternal Aunt   . Diabetes Maternal Aunt   . Colon cancer Maternal Uncle   . Diabetes Maternal Grandmother   . Stroke Paternal Grandfather     ALLERGIES:  is allergic to bee venom; other; imitrex [sumatriptan]; metoclopramide; and transderm-scop [scopolamine].  MEDICATIONS:  Current Outpatient Medications  Medication Sig Dispense Refill  . EPINEPHrine (EPIPEN) 0.3 mg/0.3 mL IJ SOAJ injection Inject 0.3 mg into the muscle once.     . Multiple Vitamins-Minerals (MULTIVITAMIN PO) Take 1 tablet by mouth daily.     No current facility-administered medications for this visit.     REVIEW OF SYSTEMS:   Constitutional: Denies fevers, chills or abnormal night sweats Eyes: Denies blurriness of vision, double vision or watery eyes Ears, nose, mouth, throat, and face: Denies mucositis or sore throat Respiratory: Denies cough, dyspnea or wheezes Cardiovascular: Denies palpitation, chest discomfort or lower extremity swelling Gastrointestinal:  Denies nausea, heartburn or change in bowel habits Skin: Denies abnormal skin rashes Lymphatics: Denies new lymphadenopathy or easy bruising Neurological:Denies numbness, tingling or new weaknesses Behavioral/Psych: Mood is stable, no new changes  Breast: Right breast palpable lump All other systems were reviewed with the patient and are negative.  PHYSICAL EXAMINATION: ECOG PERFORMANCE STATUS: 0 - Asymptomatic  Vitals:   06/29/18 1611  BP: 123/87  Pulse: 96  Resp: 18  Temp: 98.7 F (37.1 C)  SpO2: 97%   Filed Weights   06/29/18 1611  Weight: 200 lb 4.8 oz (90.9 kg)    GENERAL:alert, no distress and comfortable SKIN: skin color, texture, turgor are normal, no rashes or significant lesions EYES: normal, conjunctiva are pink and non-injected, sclera clear OROPHARYNX:no exudate, no erythema and lips, buccal mucosa, and tongue normal  NECK: supple, thyroid normal size, non-tender, without  nodularity LYMPH:  no palpable lymphadenopathy in the cervical, axillary or inguinal LUNGS: clear to auscultation and percussion with normal breathing effort HEART: regular rate & rhythm and no murmurs and no lower extremity edema ABDOMEN:abdomen soft, non-tender and normal bowel sounds Musculoskeletal:no cyanosis of digits and no clubbing  PSYCH: alert & oriented x 3 with fluent speech NEURO: no focal motor/sensory deficits    LABORATORY DATA:  I have reviewed the data as listed Lab Results  Component Value Date   WBC 2.8 (L) 10/01/2005   HGB 14.9 11/08/2013   HCT 39.9 10/01/2005   MCV 92.3 10/01/2005   PLT 219 10/01/2005   No results found for: NA, K, CL, CO2  RADIOGRAPHIC STUDIES: I have personally reviewed the radiological reports and agreed with the findings  in the report.  ASSESSMENT AND PLAN:  Malignant neoplasm of upper-outer quadrant of right breast in female, estrogen receptor negative ([place]) 06/22/2018: Screening mammogram detected right breast mass 1.1 cm at 10 o'clock position right breast 12 cm from the nipple, no abnormal lymph nodes, biopsy of the mass revealed IDC with DCIS grade 3, ER 0%, PR 0%, HER-2 -1+ by IHC, Ki-67 40%, T1c N0 stage Ib  Pathology and radiology counseling: Discussed with the patient, the details of pathology including the type of breast cancer,the clinical staging, the significance of ER, PR and HER-2/neu receptors and the implications for treatment. After reviewing the pathology in detail, we proceeded to discuss the different treatment options between surgery, radiation, chemotherapy   Recommendation: 1.  Neo- Adjuvant chemotherapy with dose dense Adriamycin and Cytoxan x4 followed by Taxol and carboplatin weekly x12 2.  Patient prefers bilateral mastectomies plus or minus reconstruction Followed by surveillance  Chemotherapy Counseling: I discussed the risks and benefits of chemotherapy including the risks of nausea/ vomiting, risk of  infection from low WBC count, fatigue due to chemo or anemia, bruising or bleeding due to low platelets, mouth sores, loss/ change in taste and decreased appetite. Liver and kidney function will be monitored through out chemotherapy as abnormalities in liver and kidney function may be a side effect of treatment. Cardiac dysfunction due to Adriamycin was discussed in detail. Risk of permanent bone marrow dysfunction and leukemia due to chemo were also discussed.  Plan: 1.  Breast MRI 2.  Echocardiogram 3.  Chemo class 4.  Port placement  Plan to start chemotherapy next week as soon as the port is in.   All questions were answered. The patient knows to call the clinic with any problems, questions or concerns.    Harriette Ohara, MD 06/29/18

## 2018-06-30 ENCOUNTER — Encounter: Payer: Self-pay | Admitting: *Deleted

## 2018-06-30 ENCOUNTER — Telehealth: Payer: Self-pay | Admitting: Hematology and Oncology

## 2018-06-30 NOTE — Telephone Encounter (Signed)
Per 12/30 no los °

## 2018-07-01 DIAGNOSIS — C50919 Malignant neoplasm of unspecified site of unspecified female breast: Secondary | ICD-10-CM

## 2018-07-01 HISTORY — DX: Malignant neoplasm of unspecified site of unspecified female breast: C50.919

## 2018-07-02 ENCOUNTER — Other Ambulatory Visit: Payer: Self-pay | Admitting: General Surgery

## 2018-07-03 ENCOUNTER — Other Ambulatory Visit: Payer: Self-pay

## 2018-07-03 ENCOUNTER — Inpatient Hospital Stay: Payer: BLUE CROSS/BLUE SHIELD | Attending: Hematology and Oncology

## 2018-07-03 ENCOUNTER — Ambulatory Visit (HOSPITAL_COMMUNITY)
Admission: RE | Admit: 2018-07-03 | Discharge: 2018-07-03 | Disposition: A | Discharge status: Home / Self Care | Payer: BLUE CROSS/BLUE SHIELD | Source: Ambulatory Visit | Attending: Hematology and Oncology | Admitting: Hematology and Oncology

## 2018-07-03 ENCOUNTER — Encounter: Payer: Self-pay | Admitting: Hematology and Oncology

## 2018-07-03 ENCOUNTER — Encounter (HOSPITAL_BASED_OUTPATIENT_CLINIC_OR_DEPARTMENT_OTHER): Payer: Self-pay | Admitting: *Deleted

## 2018-07-03 DIAGNOSIS — N6324 Unspecified lump in the left breast, lower inner quadrant: Secondary | ICD-10-CM | POA: Insufficient documentation

## 2018-07-03 DIAGNOSIS — C50411 Malignant neoplasm of upper-outer quadrant of right female breast: Secondary | ICD-10-CM | POA: Insufficient documentation

## 2018-07-03 DIAGNOSIS — K1379 Other lesions of oral mucosa: Secondary | ICD-10-CM | POA: Insufficient documentation

## 2018-07-03 DIAGNOSIS — Z171 Estrogen receptor negative status [ER-]: Secondary | ICD-10-CM

## 2018-07-03 DIAGNOSIS — Z5111 Encounter for antineoplastic chemotherapy: Secondary | ICD-10-CM | POA: Insufficient documentation

## 2018-07-03 DIAGNOSIS — R530 Neoplastic (malignant) related fatigue: Secondary | ICD-10-CM | POA: Insufficient documentation

## 2018-07-03 DIAGNOSIS — K521 Toxic gastroenteritis and colitis: Secondary | ICD-10-CM | POA: Insufficient documentation

## 2018-07-03 DIAGNOSIS — T451X5A Adverse effect of antineoplastic and immunosuppressive drugs, initial encounter: Secondary | ICD-10-CM | POA: Insufficient documentation

## 2018-07-03 NOTE — Progress Notes (Signed)
Met with patient and family members to introduce myself as Arboriculturist and to offer available resources. Advised patient there are foundations with copay assistance available for her diagnosis, but her gross household income would be needed to apply She will get that information to me. Advised her of copay assistance available through [employer] for [employer] which does not require income information. She gave me permission to enroll.  Discussed the one-time $1000 Radio broadcast assistant. She will advise me when her income reduces, she will advise me of to apply.  Gave her information on [employer] for her concern with bills.  Gave her my card for any additional financial questions or concerns.  Enrolled patient in [employer] online and was successful.

## 2018-07-03 NOTE — Progress Notes (Signed)
Ensure pre surgery drink given with instructions to complete by 0745 dos, surgical soap given with instructions, pt verbalized understanding. 

## 2018-07-03 NOTE — Progress Notes (Signed)
Received confirmation of enrollment in [place] assistance via fax from [place] [employer].  Patient approved for maximum amount of $15,000 over the next 12 months leaving her with a $0 copay after insurance for each injection.  Approval given to [facility] for billing/copay purposes. Patient will receive a copy in the mail as well for her records only.

## 2018-07-03 NOTE — Progress Notes (Signed)
  Echocardiogram 2D Echocardiogram has been performed.  Holly Wiley 07/03/2018, 10:45 AM

## 2018-07-03 NOTE — Pre-Procedure Instructions (Signed)
To come pick up Ensure pre-surgery drink 10 oz. - to drink by 0745 DOS; to pick up CHG soap to use in shower night before surgery and AM DOS.

## 2018-07-04 ENCOUNTER — Ambulatory Visit (HOSPITAL_COMMUNITY)
Admission: RE | Admit: 2018-07-04 | Discharge: 2018-07-04 | Disposition: A | Discharge status: Home / Self Care | Payer: BLUE CROSS/BLUE SHIELD | Source: Ambulatory Visit | Attending: Hematology and Oncology | Admitting: Hematology and Oncology

## 2018-07-04 DIAGNOSIS — Z171 Estrogen receptor negative status [ER-]: Principal | ICD-10-CM

## 2018-07-04 DIAGNOSIS — C50411 Malignant neoplasm of upper-outer quadrant of right female breast: Secondary | ICD-10-CM

## 2018-07-04 MED ORDER — GADOBUTROL 1 MMOL/ML IV SOLN
10.0000 mL | Freq: Once | INTRAVENOUS | Status: AC | PRN
  Administered 2018-07-04: 9 mL via INTRAVENOUS
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
## 2018-07-06 ENCOUNTER — Telehealth: Payer: Self-pay | Admitting: Hematology and Oncology

## 2018-07-06 ENCOUNTER — Other Ambulatory Visit: Payer: Self-pay | Admitting: General Surgery

## 2018-07-06 DIAGNOSIS — R9389 Abnormal findings on diagnostic imaging of other specified body structures: Secondary | ICD-10-CM

## 2018-07-06 NOTE — Telephone Encounter (Signed)
Spoke with patient re 1/9 appointments. Patient to get updated schedule at 1/9 visit.

## 2018-07-08 ENCOUNTER — Other Ambulatory Visit: Payer: Self-pay

## 2018-07-08 ENCOUNTER — Ambulatory Visit (HOSPITAL_COMMUNITY): Payer: BLUE CROSS/BLUE SHIELD

## 2018-07-08 ENCOUNTER — Encounter (HOSPITAL_BASED_OUTPATIENT_CLINIC_OR_DEPARTMENT_OTHER): Payer: Self-pay

## 2018-07-08 ENCOUNTER — Other Ambulatory Visit: Payer: Self-pay | Admitting: *Deleted

## 2018-07-08 ENCOUNTER — Ambulatory Visit (HOSPITAL_BASED_OUTPATIENT_CLINIC_OR_DEPARTMENT_OTHER)
Admission: RE | Admit: 2018-07-08 | Discharge: 2018-07-08 | Disposition: A | Discharge status: Home / Self Care | Payer: BLUE CROSS/BLUE SHIELD | Attending: General Surgery | Admitting: General Surgery

## 2018-07-08 ENCOUNTER — Ambulatory Visit (HOSPITAL_BASED_OUTPATIENT_CLINIC_OR_DEPARTMENT_OTHER): Payer: BLUE CROSS/BLUE SHIELD | Admitting: Certified Registered"

## 2018-07-08 ENCOUNTER — Encounter (HOSPITAL_BASED_OUTPATIENT_CLINIC_OR_DEPARTMENT_OTHER)
Admission: RE | Disposition: A | Discharge status: Home / Self Care | Payer: Self-pay | Source: Home / Self Care | Attending: General Surgery

## 2018-07-08 DIAGNOSIS — Z803 Family history of malignant neoplasm of breast: Secondary | ICD-10-CM | POA: Diagnosis not present

## 2018-07-08 DIAGNOSIS — Z79899 Other long term (current) drug therapy: Secondary | ICD-10-CM | POA: Diagnosis not present

## 2018-07-08 DIAGNOSIS — Z95828 Presence of other vascular implants and grafts: Secondary | ICD-10-CM

## 2018-07-08 DIAGNOSIS — Z87820 Personal history of traumatic brain injury: Secondary | ICD-10-CM | POA: Insufficient documentation

## 2018-07-08 DIAGNOSIS — Z7951 Long term (current) use of inhaled steroids: Secondary | ICD-10-CM | POA: Insufficient documentation

## 2018-07-08 DIAGNOSIS — Z888 Allergy status to other drugs, medicaments and biological substances status: Secondary | ICD-10-CM | POA: Insufficient documentation

## 2018-07-08 DIAGNOSIS — C50411 Malignant neoplasm of upper-outer quadrant of right female breast: Secondary | ICD-10-CM | POA: Diagnosis not present

## 2018-07-08 DIAGNOSIS — Z171 Estrogen receptor negative status [ER-]: Principal | ICD-10-CM

## 2018-07-08 HISTORY — DX: Dental restoration status: Z98.811

## 2018-07-08 HISTORY — DX: Malignant neoplasm of unspecified site of unspecified female breast: C50.919

## 2018-07-08 HISTORY — DX: Nausea with vomiting, unspecified: R11.2

## 2018-07-08 HISTORY — DX: Other specified postprocedural states: Z98.890

## 2018-07-08 HISTORY — PX: PORTACATH PLACEMENT: SHX2246

## 2018-07-08 LAB — POCT PREGNANCY, URINE: Preg Test, Ur: NEGATIVE

## 2018-07-08 SURGERY — INSERTION, TUNNELED CENTRAL VENOUS DEVICE, WITH PORT
Anesthesia: General | Site: Chest | Laterality: Right

## 2018-07-08 MED ORDER — CELECOXIB 200 MG PO CAPS
ORAL_CAPSULE | ORAL | Status: AC
  Filled 2018-07-08: qty 1

## 2018-07-08 MED ORDER — ONDANSETRON HCL 4 MG/2ML IJ SOLN
INTRAMUSCULAR | Status: AC
  Filled 2018-07-08: qty 4

## 2018-07-08 MED ORDER — OXYCODONE HCL 5 MG PO TABS: 5.0000 mg | ORAL_TABLET | Freq: Once | ORAL | Status: DC | PRN

## 2018-07-08 MED ORDER — FENTANYL CITRATE (PF) 100 MCG/2ML IJ SOLN
INTRAMUSCULAR | Status: AC
  Filled 2018-07-08: qty 2

## 2018-07-08 MED ORDER — HEPARIN (PORCINE) IN NACL 2-0.9 UNITS/ML
INTRAMUSCULAR | Status: AC | PRN
  Administered 2018-07-08: 1 via INTRAVENOUS

## 2018-07-08 MED ORDER — GABAPENTIN 300 MG PO CAPS
300.0000 mg | ORAL_CAPSULE | ORAL | Status: AC
  Administered 2018-07-08: 300 mg via ORAL

## 2018-07-08 MED ORDER — FENTANYL CITRATE (PF) 100 MCG/2ML IJ SOLN
25.0000 ug | INTRAMUSCULAR | Status: DC | PRN
  Administered 2018-07-08: 25 ug via INTRAVENOUS

## 2018-07-08 MED ORDER — MIDAZOLAM HCL 2 MG/2ML IJ SOLN
INTRAMUSCULAR | Status: AC
  Filled 2018-07-08: qty 2

## 2018-07-08 MED ORDER — ACETAMINOPHEN 500 MG PO TABS
1000.0000 mg | ORAL_TABLET | ORAL | Status: AC
  Administered 2018-07-08: 1000 mg via ORAL

## 2018-07-08 MED ORDER — CEFAZOLIN SODIUM-DEXTROSE 2-4 GM/100ML-% IV SOLN
INTRAVENOUS | Status: AC
  Filled 2018-07-08: qty 100

## 2018-07-08 MED ORDER — MIDAZOLAM HCL 5 MG/5ML IJ SOLN
INTRAMUSCULAR | Status: DC | PRN
  Administered 2018-07-08: 2 mg via INTRAVENOUS

## 2018-07-08 MED ORDER — PHENYLEPHRINE 40 MCG/ML (10ML) SYRINGE FOR IV PUSH (FOR BLOOD PRESSURE SUPPORT)
PREFILLED_SYRINGE | INTRAVENOUS | Status: DC | PRN
  Administered 2018-07-08 (×2): 120 ug via INTRAVENOUS

## 2018-07-08 MED ORDER — CEFAZOLIN SODIUM-DEXTROSE 2-4 GM/100ML-% IV SOLN
2.0000 g | INTRAVENOUS | Status: AC
  Administered 2018-07-08: 2 g via INTRAVENOUS

## 2018-07-08 MED ORDER — MIDAZOLAM HCL 2 MG/2ML IJ SOLN: 1.0000 mg | INTRAMUSCULAR | Status: DC | PRN

## 2018-07-08 MED ORDER — CHLORHEXIDINE GLUCONATE CLOTH 2 % EX PADS: 6.0000 | MEDICATED_PAD | Freq: Once | CUTANEOUS | Status: DC

## 2018-07-08 MED ORDER — ONDANSETRON HCL 4 MG/2ML IJ SOLN
INTRAMUSCULAR | Status: DC | PRN
  Administered 2018-07-08: 4 mg via INTRAVENOUS

## 2018-07-08 MED ORDER — PROMETHAZINE HCL 25 MG/ML IJ SOLN: 6.2500 mg | INTRAMUSCULAR | Status: DC | PRN

## 2018-07-08 MED ORDER — FENTANYL CITRATE (PF) 100 MCG/2ML IJ SOLN: 50.0000 ug | INTRAMUSCULAR | Status: DC | PRN

## 2018-07-08 MED ORDER — ACETAMINOPHEN 500 MG PO TABS
ORAL_TABLET | ORAL | Status: AC
  Filled 2018-07-08: qty 2

## 2018-07-08 MED ORDER — DEXAMETHASONE SODIUM PHOSPHATE 10 MG/ML IJ SOLN
INTRAMUSCULAR | Status: AC
  Filled 2018-07-08: qty 1

## 2018-07-08 MED ORDER — CELECOXIB 200 MG PO CAPS
200.0000 mg | ORAL_CAPSULE | ORAL | Status: AC
  Administered 2018-07-08: 200 mg via ORAL

## 2018-07-08 MED ORDER — MEPERIDINE HCL 25 MG/ML IJ SOLN: 6.2500 mg | INTRAMUSCULAR | Status: DC | PRN

## 2018-07-08 MED ORDER — FENTANYL CITRATE (PF) 100 MCG/2ML IJ SOLN
INTRAMUSCULAR | Status: DC | PRN
  Administered 2018-07-08 (×2): 50 ug via INTRAVENOUS

## 2018-07-08 MED ORDER — LACTATED RINGERS IV SOLN
INTRAVENOUS | Status: DC
  Administered 2018-07-08: 10:00:00 via INTRAVENOUS

## 2018-07-08 MED ORDER — DEXAMETHASONE SODIUM PHOSPHATE 10 MG/ML IJ SOLN
INTRAMUSCULAR | Status: DC | PRN
  Administered 2018-07-08: 10 mg via INTRAVENOUS

## 2018-07-08 MED ORDER — PROPOFOL 10 MG/ML IV BOLUS
INTRAVENOUS | Status: DC | PRN
  Administered 2018-07-08: 180 mg via INTRAVENOUS

## 2018-07-08 MED ORDER — LACTATED RINGERS IV SOLN: INTRAVENOUS | Status: DC

## 2018-07-08 MED ORDER — HEPARIN SOD (PORK) LOCK FLUSH 100 UNIT/ML IV SOLN
INTRAVENOUS | Status: DC | PRN
  Administered 2018-07-08: 400 [IU] via INTRAVENOUS

## 2018-07-08 MED ORDER — OXYCODONE HCL 5 MG/5ML PO SOLN: 5.0000 mg | Freq: Once | ORAL | Status: DC | PRN

## 2018-07-08 MED ORDER — SCOPOLAMINE 1 MG/3DAYS TD PT72: 1.0000 | MEDICATED_PATCH | Freq: Once | TRANSDERMAL | Status: DC | PRN

## 2018-07-08 MED ORDER — GABAPENTIN 300 MG PO CAPS
ORAL_CAPSULE | ORAL | Status: AC
  Filled 2018-07-08: qty 1

## 2018-07-08 MED ORDER — PROPOFOL 10 MG/ML IV BOLUS
INTRAVENOUS | Status: AC
  Filled 2018-07-08: qty 20

## 2018-07-08 MED ORDER — HYDROCODONE-ACETAMINOPHEN 5-325 MG PO TABS: 1.0000 | ORAL_TABLET | Freq: Four times a day (QID) | ORAL | 0 refills | Status: DC | PRN

## 2018-07-08 MED ORDER — LIDOCAINE 2% (20 MG/ML) 5 ML SYRINGE
INTRAMUSCULAR | Status: DC | PRN
  Administered 2018-07-08: 80 mg via INTRAVENOUS

## 2018-07-08 MED ORDER — BUPIVACAINE-EPINEPHRINE (PF) 0.25% -1:200000 IJ SOLN
INTRAMUSCULAR | Status: DC | PRN
  Administered 2018-07-08: 18 mL

## 2018-07-08 SURGICAL SUPPLY — 56 items
ADH SKN CLS APL DERMABOND .7 (GAUZE/BANDAGES/DRESSINGS) ×1
APL SKNCLS STERI-STRIP NONHPOA (GAUZE/BANDAGES/DRESSINGS) ×1
BAG DECANTER FOR FLEXI CONT (MISCELLANEOUS) ×3 IMPLANT
BENZOIN TINCTURE PRP APPL 2/3 (GAUZE/BANDAGES/DRESSINGS) ×2 IMPLANT
BLADE HEX COATED 2.75 (ELECTRODE) ×3 IMPLANT
BLADE SURG 15 STRL LF DISP TIS (BLADE) ×1 IMPLANT
BLADE SURG 15 STRL SS (BLADE) ×3
CANISTER SUCT 1200ML W/VALVE (MISCELLANEOUS) IMPLANT
CHLORAPREP W/TINT 26ML (MISCELLANEOUS) ×3 IMPLANT
CLOSURE WOUND 1/2 X4 (GAUZE/BANDAGES/DRESSINGS) ×1
COVER BACK TABLE 60X90IN (DRAPES) ×3 IMPLANT
COVER MAYO STAND STRL (DRAPES) ×3 IMPLANT
COVER PROBE 5X48 (MISCELLANEOUS) ×3
COVER WAND RF STERILE (DRAPES) IMPLANT
DECANTER SPIKE VIAL GLASS SM (MISCELLANEOUS) IMPLANT
DERMABOND ADVANCED (GAUZE/BANDAGES/DRESSINGS) ×2
DERMABOND ADVANCED .7 DNX12 (GAUZE/BANDAGES/DRESSINGS) ×1 IMPLANT
DRAPE C-ARM 42X72 X-RAY (DRAPES) ×3 IMPLANT
DRAPE LAPAROSCOPIC ABDOMINAL (DRAPES) ×3 IMPLANT
DRAPE UTILITY XL STRL (DRAPES) ×3 IMPLANT
DRSG TEGADERM 2-3/8X2-3/4 SM (GAUZE/BANDAGES/DRESSINGS) ×2 IMPLANT
DRSG TEGADERM 4X10 (GAUZE/BANDAGES/DRESSINGS) IMPLANT
DRSG TEGADERM 4X4.75 (GAUZE/BANDAGES/DRESSINGS) ×2 IMPLANT
ELECT REM PT RETURN 9FT ADLT (ELECTROSURGICAL) ×3
ELECTRODE REM PT RTRN 9FT ADLT (ELECTROSURGICAL) ×1 IMPLANT
GAUZE SPONGE 4X4 12PLY STRL LF (GAUZE/BANDAGES/DRESSINGS) ×2 IMPLANT
GLOVE EUDERMIC 7 POWDERFREE (GLOVE) ×3 IMPLANT
GOWN STRL REUS W/ TWL LRG LVL3 (GOWN DISPOSABLE) ×1 IMPLANT
GOWN STRL REUS W/ TWL XL LVL3 (GOWN DISPOSABLE) ×1 IMPLANT
GOWN STRL REUS W/TWL LRG LVL3 (GOWN DISPOSABLE) ×3
GOWN STRL REUS W/TWL XL LVL3 (GOWN DISPOSABLE) ×3
IV CATH PLACEMENT UNIT 16 GA (IV SOLUTION) IMPLANT
IV KIT MINILOC 20X1 SAFETY (NEEDLE) IMPLANT
KIT CVR 48X5XPRB PLUP LF (MISCELLANEOUS) ×1 IMPLANT
KIT PORT POWER 8FR ISP CVUE (Port) ×3 IMPLANT
NDL BLUNT 17GA (NEEDLE) IMPLANT
NDL HYPO 25X1 1.5 SAFETY (NEEDLE) ×1 IMPLANT
NEEDLE BLUNT 17GA (NEEDLE) IMPLANT
NEEDLE HYPO 22GX1.5 SAFETY (NEEDLE) ×3 IMPLANT
NEEDLE HYPO 25X1 1.5 SAFETY (NEEDLE) ×3 IMPLANT
PACK BASIN DAY SURGERY FS (CUSTOM PROCEDURE TRAY) ×3 IMPLANT
PENCIL BUTTON HOLSTER BLD 10FT (ELECTRODE) ×3 IMPLANT
SET SHEATH INTRODUCER 10FR (MISCELLANEOUS) IMPLANT
SHEATH COOK PEEL AWAY SET 9F (SHEATH) IMPLANT
SLEEVE SCD COMPRESS KNEE MED (MISCELLANEOUS) ×3 IMPLANT
STRIP CLOSURE SKIN 1/2X4 (GAUZE/BANDAGES/DRESSINGS) ×1 IMPLANT
SUT MNCRL AB 4-0 PS2 18 (SUTURE) ×3 IMPLANT
SUT PROLENE 2 0 CT2 30 (SUTURE) ×3 IMPLANT
SUT VICRYL 3-0 CR8 SH (SUTURE) ×3 IMPLANT
SYR 10ML LL (SYRINGE) ×3 IMPLANT
SYR 5ML LUER SLIP (SYRINGE) ×3 IMPLANT
TOWEL GREEN STERILE FF (TOWEL DISPOSABLE) ×6 IMPLANT
TOWEL OR NON WOVEN STRL DISP B (DISPOSABLE) ×3 IMPLANT
TUBE CONNECTING 20'X1/4 (TUBING)
TUBE CONNECTING 20X1/4 (TUBING) IMPLANT
YANKAUER SUCT BULB TIP NO VENT (SUCTIONS) IMPLANT

## 2018-07-08 NOTE — Anesthesia Preprocedure Evaluation (Addendum)
Anesthesia Evaluation  Patient identified by MRN, date of birth, ID band Patient awake    Reviewed: Allergy & Precautions, NPO status , Patient's Chart, lab work & pertinent test results  History of Anesthesia Complications (+) PONV  Airway Mallampati: I  TM Distance: >3 FB Neck ROM: Full    Dental  (+) Teeth Intact, Dental Advisory Given   Pulmonary    breath sounds clear to auscultation       Cardiovascular negative cardio ROS   Rhythm:Regular Rate:Normal     Neuro/Psych  Headaches,    GI/Hepatic negative GI ROS, Neg liver ROS,   Endo/Other  negative endocrine ROS  Renal/GU negative Renal ROS     Musculoskeletal negative musculoskeletal ROS (+)   Abdominal Normal abdominal exam  (+)   Peds  Hematology negative hematology ROS (+)   Anesthesia Other Findings   Reproductive/Obstetrics                            Anesthesia Physical Anesthesia Plan  ASA: II  Anesthesia Plan: General   Post-op Pain Management:    Induction: Intravenous  PONV Risk Score and Plan: 4 or greater and Ondansetron, Dexamethasone, Midazolam and Scopolamine patch - Pre-op  Airway Management Planned: LMA  Additional Equipment: None  Intra-op Plan:   Post-operative Plan: Extubation in OR  Informed Consent: I have reviewed the patients History and Physical, chart, labs and discussed the procedure including the risks, benefits and alternatives for the proposed anesthesia with the patient or authorized representative who has indicated his/her understanding and acceptance.   Dental advisory given  Plan Discussed with: CRNA  Anesthesia Plan Comments:       Anesthesia Quick Evaluation

## 2018-07-08 NOTE — Anesthesia Procedure Notes (Signed)
Procedure Name: LMA Insertion Date/Time: 07/08/2018 12:02 PM Performed by: Gwyndolyn Saxon, CRNA Pre-anesthesia Checklist: Patient identified, Emergency Drugs available, Suction available and Patient being monitored Patient Re-evaluated:Patient Re-evaluated prior to induction Oxygen Delivery Method: Circle system utilized Preoxygenation: Pre-oxygenation with 100% oxygen Induction Type: IV induction Ventilation: Mask ventilation without difficulty LMA: LMA inserted LMA Size: 4.0 Number of attempts: 1 Airway Equipment and Method: Patient positioned with wedge pillow Placement Confirmation: positive ETCO2 and breath sounds checked- equal and bilateral Tube secured with: Tape Dental Injury: Teeth and Oropharynx as per pre-operative assessment
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
## 2018-07-08 NOTE — Progress Notes (Signed)
Patient Care Team: [name], NP as PCP - General (Nurse Practitioner)  DIAGNOSIS:    ICD-10-CM   1. Malignant neoplasm of upper-outer quadrant of right breast in female, estrogen receptor negative ([place]) C50.411    Z17.1     SUMMARY OF ONCOLOGIC HISTORY:   Malignant neoplasm of upper-outer quadrant of right breast in female, estrogen receptor negative ([place])   06/22/2018 Initial Diagnosis    Screening mammogram detected right breast mass 1.1 cm at 10 o'clock position right breast 12 cm from the nipple, no abnormal lymph nodes, biopsy of the mass revealed IDC with DCIS grade 3, ER 0%, PR 0%, HER-2 -1+ by IHC, Ki-67 40%, T1c N0 stage Ib    06/29/2018 Cancer Staging    Staging form: Breast, AJCC 8th Edition - Clinical stage from 06/29/2018: Stage IB (cT1c, cN0, cM0, G3, ER-, PR-, HER2-) - Signed by Nicholas Lose, MD on 06/29/2018    07/09/2018 -  Neo-Adjuvant Chemotherapy     Neo- Adjuvant chemotherapy with dose dense Adriamycin and Cytoxan x4 followed by Taxol and carboplatin weekly x12     CHIEF COMPLIANT: Cycle 1 of Adriamycin and Cytoxan  INTERVAL HISTORY: Holly Wiley is a 43 y.o. with above-mentioned history of triple negative right breast cancer. She had her port inserted yesterday by Dr. Dalbert Batman. An ECHO on 07/03/18 showed and ejection fracture in the range of 55-60%. She presents to the clinic today with her mom for Cycle 1 of Adriamycin and Cytoxan chemotherapy. She reports her port site is sore but is improving. She is still planning on bilateral mastectomies. She is knowledgeable of how to take her medication.   REVIEW OF SYSTEMS:   Constitutional: Denies fevers, chills or abnormal weight loss Eyes: Denies blurriness of vision Ears, nose, mouth, throat, and face: Denies mucositis or sore throat Respiratory: Denies cough, dyspnea or wheezes Cardiovascular: Denies palpitation, chest discomfort Gastrointestinal:  Denies nausea, heartburn or change in bowel habits Skin:  Denies abnormal skin rashes Lymphatics: Denies new lymphadenopathy or easy bruising Neurological:Denies numbness, tingling or new weaknesses Behavioral/Psych: Mood is stable, no new changes  Extremities: No lower extremity edema Breast: (+) pain around palpable lumps in both breasts All other systems were reviewed with the patient and are negative.  I have reviewed the past medical history, past surgical history, social history and family history with the patient and they are unchanged from previous note.  ALLERGIES:  is allergic to bee venom; dihydroergotamine; imitrex [sumatriptan]; metoclopramide; and transderm-scop [scopolamine].  MEDICATIONS:  Current Outpatient Medications  Medication Sig Dispense Refill  . dexamethasone (DECADRON) 4 MG tablet Take 1 tablet day after chemo and 1 tablet 2 days after chemo with food 8 tablet 0  . EPINEPHrine (EPIPEN) 0.3 mg/0.3 mL IJ SOAJ injection Inject 0.3 mg into the muscle once.     Marland Kitchen HYDROcodone-acetaminophen (NORCO) 5-325 MG tablet Take 1-2 tablets by mouth every 6 (six) hours as needed for moderate pain or severe pain. 20 tablet 0  . lidocaine-prilocaine (EMLA) cream Apply to affected area once 30 g 3  . LORazepam (ATIVAN) 0.5 MG tablet Take 1 tablet (0.5 mg total) by mouth at bedtime as needed for sleep. 30 tablet 0  . ondansetron (ZOFRAN) 8 MG tablet Take 1 tablet (8 mg total) by mouth 2 (two) times daily as needed. Start on the third day after chemotherapy. 30 tablet 1  . prochlorperazine (COMPAZINE) 10 MG tablet Take 1 tablet (10 mg total) by mouth every 6 (six) hours as needed (  Nausea or vomiting). 30 tablet 1   No current facility-administered medications for this visit.    Facility-Administered Medications Ordered in Other Visits  Medication Dose Route Frequency Provider Last Rate Last Dose  . sodium chloride flush (NS) 0.9 % injection 10 mL  10 mL Intravenous PRN Nicholas Lose, MD   10 mL at 07/09/18 5329    PHYSICAL  EXAMINATION: ECOG PERFORMANCE STATUS: 1 - Symptomatic but completely ambulatory  Vitals:   07/09/18 0907  BP: 120/74  Pulse: 69  Resp: 18  Temp: 98.4 F (36.9 C)  SpO2: 99%   Filed Weights   07/09/18 0907  Weight: 201 lb 9.6 oz (91.4 kg)    GENERAL:alert, no distress and comfortable SKIN: skin color, texture, turgor are normal, no rashes or significant lesions EYES: normal, Conjunctiva are pink and non-injected, sclera clear OROPHARYNX:no exudate, no erythema and lips, buccal mucosa, and tongue normal  NECK: supple, thyroid normal size, non-tender, without nodularity LYMPH:  no palpable lymphadenopathy in the cervical, axillary or inguinal LUNGS: clear to auscultation and percussion with normal breathing effort HEART: regular rate & rhythm and no murmurs and no lower extremity edema ABDOMEN:abdomen soft, non-tender and normal bowel sounds MUSCULOSKELETAL:no cyanosis of digits and no clubbing  NEURO: alert & oriented x 3 with fluent speech, no focal motor/sensory deficits EXTREMITIES: No lower extremity edema  LABORATORY DATA:  I have reviewed the data as listed No flowsheet data found.  Lab Results  Component Value Date   WBC 15.7 (H) 07/09/2018   HGB 13.3 07/09/2018   HCT 39.3 07/09/2018   MCV 94.2 07/09/2018   PLT 246 07/09/2018   NEUTROABS 12.5 (H) 07/09/2018    ASSESSMENT & PLAN:  Malignant neoplasm of upper-outer quadrant of right breast in female, estrogen receptor negative ([place]) 06/22/2018: Screening mammogram detected right breast mass 1.1 cm at 10 o'clock position right breast 12 cm from the nipple, no abnormal lymph nodes, biopsy of the mass revealed IDC with DCIS grade 3, ER 0%, PR 0%, HER-2 -1+ by IHC, Ki-67 40%, T1c N0 stage Ib  Recommendation: 1.  Neo- Adjuvant chemotherapy with dose dense Adriamycin and Cytoxan x4 followed by Taxol and carboplatin weekly x12 2.  Patient prefers bilateral mastectomies plus or minus reconstruction Followed by  surveillance --------------------------------------------------------------------------------------------------------------------------------- MRI breast 07/04/2018: Known malignancy right UOQ 1.4 cm, upper central portion right breast 0.7 cm, LIQ left breast 0.7 cm We discussed her case in tumor board and we will plan to biopsy the left breast. Because she plans to have bilateral mastectomies the only discussion is whether she needs lymph node evaluation on the left breast and that is the reason why we plan to biopsy the left.  Current treatment: Cycle 1 day 1 dose dense Adriamycin and Cytoxan Labs reviewed Chemo consent obtained Echocardiogram 07/03/2018: EF 55 to 60% Return to clinic in 1 week for toxicity evaluation    No orders of the defined types were placed in this encounter.  The patient has a good understanding of the overall plan. she agrees with it. she will call with any problems that may develop before the next visit here.  Nicholas Lose, MD 07/09/2018   I, Molly Dorshimer, am acting as scribe for Nicholas Lose, MD.  I have reviewed the above documentation for accuracy and completeness, and I agree with the above.

## 2018-07-08 NOTE — Discharge Instructions (Signed)

## 2018-07-08 NOTE — Interval H&P Note (Signed)
History and Physical Interval Note:  07/08/2018 11:29 AM  Holly Wiley  has presented today for surgery, with the diagnosis of BREAST CANCER  The various methods of treatment have been discussed with the patient and family. After consideration of risks, benefits and other options for treatment, the patient has consented to  Procedure(s): INSERTION PORT-A-CATH WITH POSSIBLE ULTRASOUND (N/A) as a surgical intervention .  The patient's history has been reviewed, patient examined, no change in status, stable for surgery.  I have reviewed the patient's chart and labs.  Questions were answered to the patient's satisfaction.     Adin Hector

## 2018-07-08 NOTE — Transfer of Care (Signed)
Immediate Anesthesia Transfer of Care Note  Patient: Holly Wiley  Procedure(s) Performed: INSERTION PORT-A-CATH (Right Chest)  Patient Location: PACU  Anesthesia Type:General  Level of Consciousness: awake, alert  and oriented  Airway & Oxygen Therapy: Patient Spontanous Breathing and Patient connected to face mask oxygen  Post-op Assessment: Report given to RN and Post -op Vital signs reviewed and stable  Post vital signs: Reviewed and stable  Last Vitals:  Vitals Value Taken Time  BP    Temp    Pulse 90 07/08/2018 12:53 PM  Resp    SpO2 100 % 07/08/2018 12:53 PM  Vitals shown include unvalidated device data.  Last Pain:  Vitals:   07/08/18 0950  TempSrc: Oral  PainSc: 0-No pain         Complications: No apparent anesthesia complications

## 2018-07-08 NOTE — Anesthesia Postprocedure Evaluation (Signed)
Anesthesia Post Note  Patient: Holly Wiley  Procedure(s) Performed: INSERTION PORT-A-CATH (Right Chest)     Patient location during evaluation: PACU Anesthesia Type: General Level of consciousness: awake and alert Pain management: pain level controlled Vital Signs Assessment: post-procedure vital signs reviewed and stable Respiratory status: spontaneous breathing, nonlabored ventilation, respiratory function stable and patient connected to nasal cannula oxygen Cardiovascular status: blood pressure returned to baseline and stable Postop Assessment: no apparent nausea or vomiting Anesthetic complications: no    Last Vitals:  Vitals:   07/08/18 1400 07/08/18 1430  BP: (!) 111/92 117/81  Pulse: 79 74  Resp: 19 18  Temp:  36.6 C  SpO2: 100% 100%    Last Pain:  Vitals:   07/08/18 1430  TempSrc:   PainSc: [place]

## 2018-07-08 NOTE — Progress Notes (Signed)
gen

## 2018-07-09 ENCOUNTER — Inpatient Hospital Stay (HOSPITAL_BASED_OUTPATIENT_CLINIC_OR_DEPARTMENT_OTHER): Payer: BLUE CROSS/BLUE SHIELD | Admitting: Hematology and Oncology

## 2018-07-09 ENCOUNTER — Inpatient Hospital Stay: Payer: BLUE CROSS/BLUE SHIELD

## 2018-07-09 ENCOUNTER — Encounter (HOSPITAL_BASED_OUTPATIENT_CLINIC_OR_DEPARTMENT_OTHER): Payer: Self-pay | Admitting: General Surgery

## 2018-07-09 ENCOUNTER — Encounter: Payer: Self-pay | Admitting: *Deleted

## 2018-07-09 DIAGNOSIS — Z171 Estrogen receptor negative status [ER-]: Principal | ICD-10-CM

## 2018-07-09 DIAGNOSIS — C50411 Malignant neoplasm of upper-outer quadrant of right female breast: Secondary | ICD-10-CM

## 2018-07-09 DIAGNOSIS — N6324 Unspecified lump in the left breast, lower inner quadrant: Secondary | ICD-10-CM | POA: Diagnosis not present

## 2018-07-09 DIAGNOSIS — T451X5A Adverse effect of antineoplastic and immunosuppressive drugs, initial encounter: Secondary | ICD-10-CM | POA: Diagnosis not present

## 2018-07-09 DIAGNOSIS — K1379 Other lesions of oral mucosa: Secondary | ICD-10-CM | POA: Diagnosis not present

## 2018-07-09 DIAGNOSIS — Z95828 Presence of other vascular implants and grafts: Secondary | ICD-10-CM

## 2018-07-09 DIAGNOSIS — Z5111 Encounter for antineoplastic chemotherapy: Secondary | ICD-10-CM | POA: Diagnosis present

## 2018-07-09 DIAGNOSIS — K521 Toxic gastroenteritis and colitis: Secondary | ICD-10-CM | POA: Diagnosis not present

## 2018-07-09 DIAGNOSIS — R530 Neoplastic (malignant) related fatigue: Secondary | ICD-10-CM | POA: Diagnosis not present

## 2018-07-09 LAB — CBC WITH DIFFERENTIAL (CANCER CENTER ONLY)
Abs Immature Granulocytes: 0.07 10*3/uL (ref 0.00–0.07)
BASOS ABS: 0 10*3/uL (ref 0.0–0.1)
Basophils Relative: 0 %
Eosinophils Absolute: 0 10*3/uL (ref 0.0–0.5)
Eosinophils Relative: 0 %
HCT: 39.3 % (ref 36.0–46.0)
Hemoglobin: 13.3 g/dL (ref 12.0–15.0)
Immature Granulocytes: 0 %
LYMPHS ABS: 2.3 10*3/uL (ref 0.7–4.0)
Lymphocytes Relative: 15 %
MCH: 31.9 pg (ref 26.0–34.0)
MCHC: 33.8 g/dL (ref 30.0–36.0)
MCV: 94.2 fL (ref 80.0–100.0)
Monocytes Absolute: 0.8 10*3/uL (ref 0.1–1.0)
Monocytes Relative: 5 %
Neutro Abs: 12.5 10*3/uL — ABNORMAL HIGH (ref 1.7–7.7)
Neutrophils Relative %: 80 %
Platelet Count: 246 10*3/uL (ref 150–400)
RBC: 4.17 MIL/uL (ref 3.87–5.11)
RDW: 13 % (ref 11.5–15.5)
WBC Count: 15.7 10*3/uL — ABNORMAL HIGH (ref 4.0–10.5)
nRBC: 0 % (ref 0.0–0.2)

## 2018-07-09 LAB — CMP (CANCER CENTER ONLY)
ALBUMIN: 3.4 g/dL — AB (ref 3.5–5.0)
ALT: 14 U/L (ref 0–44)
ANION GAP: 7 (ref 5–15)
AST: 13 U/L — AB (ref 15–41)
Alkaline Phosphatase: 45 U/L (ref 38–126)
BUN: 8 mg/dL (ref 6–20)
CO2: 24 mmol/L (ref 22–32)
Calcium: 8.4 mg/dL — ABNORMAL LOW (ref 8.9–10.3)
Chloride: 108 mmol/L (ref 98–111)
Creatinine: 0.61 mg/dL (ref 0.44–1.00)
GFR, Est AFR Am: >60 mL/min (ref >60)
GFR, Estimated: >60 mL/min (ref >60)
Glucose, Bld: 77 mg/dL (ref 70–99)
POTASSIUM: 4 mmol/L (ref 3.5–5.1)
Sodium: 139 mmol/L (ref 135–145)
Total Bilirubin: 0.4 mg/dL (ref 0.3–1.2)
Total Protein: 6.1 g/dL — ABNORMAL LOW (ref 6.5–8.1)

## 2018-07-09 MED ORDER — SODIUM CHLORIDE 0.9 % IV SOLN
Freq: Once | INTRAVENOUS | Status: AC
  Administered 2018-07-09: 10:00:00 via INTRAVENOUS
  Filled 2018-07-09: qty 250

## 2018-07-09 MED ORDER — DOXORUBICIN HCL CHEMO IV INJECTION 2 MG/ML
60.0000 mg/m2 | Freq: Once | INTRAVENOUS | Status: AC
  Administered 2018-07-09: 124 mg via INTRAVENOUS
  Filled 2018-07-09: qty 62

## 2018-07-09 MED ORDER — SODIUM CHLORIDE 0.9 % IV SOLN
Freq: Once | INTRAVENOUS | Status: AC
  Administered 2018-07-09: 10:00:00 via INTRAVENOUS
  Filled 2018-07-09: qty 5

## 2018-07-09 MED ORDER — SODIUM CHLORIDE 0.9% FLUSH
10.0000 mL | INTRAVENOUS | Status: DC | PRN
  Administered 2018-07-09: 10 mL
  Filled 2018-07-09: qty 10

## 2018-07-09 MED ORDER — SODIUM CHLORIDE 0.9 % IV SOLN
600.0000 mg/m2 | Freq: Once | INTRAVENOUS | Status: AC
  Administered 2018-07-09: 1240 mg via INTRAVENOUS
  Filled 2018-07-09: qty 62

## 2018-07-09 MED ORDER — PALONOSETRON HCL INJECTION 0.25 MG/5ML
0.2500 mg | Freq: Once | INTRAVENOUS | Status: AC
  Administered 2018-07-09: 0.25 mg via INTRAVENOUS

## 2018-07-09 MED ORDER — HEPARIN SOD (PORK) LOCK FLUSH 100 UNIT/ML IV SOLN
500.0000 [IU] | Freq: Once | INTRAVENOUS | Status: AC | PRN
  Administered 2018-07-09: 500 [IU]
  Filled 2018-07-09: qty 5

## 2018-07-09 MED ORDER — SODIUM CHLORIDE 0.9% FLUSH
10.0000 mL | INTRAVENOUS | Status: DC | PRN
  Administered 2018-07-09: 10 mL via INTRAVENOUS
  Filled 2018-07-09: qty 10

## 2018-07-09 MED ORDER — PALONOSETRON HCL INJECTION 0.25 MG/5ML
INTRAVENOUS | Status: AC
  Filled 2018-07-09: qty 5

## 2018-07-09 NOTE — Assessment & Plan Note (Signed)
06/22/2018: Screening mammogram detected right breast mass 1.1 cm at 10 o'clock position right breast 12 cm from the nipple, no abnormal lymph nodes, biopsy of the mass revealed IDC with DCIS grade 3, ER 0%, PR 0%, HER-2 -1+ by IHC, Ki-67 40%, T1c N0 stage Ib  Recommendation: 1.  Neo- Adjuvant chemotherapy with dose dense Adriamycin and Cytoxan x4 followed by Taxol and carboplatin weekly x12 2.  Patient prefers bilateral mastectomies plus or minus reconstruction Followed by surveillance --------------------------------------------------------------------------------------------------------------------------------- MRI breast 07/04/2018: Known malignancy right UOQ 1.4 cm, upper central portion right breast 0.7 cm, LIQ left breast 0.7 cm We discussed her case in tumor board and we will plan to biopsy the left breast. Because she plans to have bilateral mastectomies the only discussion is whether she needs lymph node evaluation on the left breast and that is the reason why we plan to biopsy the left.  Current treatment: Cycle 1 day 1 dose dense Adriamycin and Cytoxan Labs reviewed Chemo consent obtained Echocardiogram 07/03/2018: EF 55 to 60% Return to clinic in 1 week for toxicity evaluation

## 2018-07-09 NOTE — Patient Instructions (Signed)
[facility] Discharge Instructions for Patients Receiving Chemotherapy  Today you received the following chemotherapy agents: doxorubicin (Adriamycin) and cyclophosphamide (Cytoxan).   To help prevent nausea and vomiting after your treatment, we encourage you to take your nausea medication as prescribed by your physician.    If you develop nausea and vomiting that is not controlled by your nausea medication, call the clinic.   BELOW ARE SYMPTOMS THAT SHOULD BE REPORTED IMMEDIATELY:  *FEVER GREATER THAN 100.5 F  *CHILLS WITH OR WITHOUT FEVER  NAUSEA AND VOMITING THAT IS NOT CONTROLLED WITH YOUR NAUSEA MEDICATION  *UNUSUAL SHORTNESS OF BREATH  *UNUSUAL BRUISING OR BLEEDING  TENDERNESS IN MOUTH AND THROAT WITH OR WITHOUT PRESENCE OF ULCERS  *URINARY PROBLEMS  *BOWEL PROBLEMS  UNUSUAL RASH Items with * indicate a potential emergency and should be followed up as soon as possible.  Feel free to call the clinic should you have any questions or concerns. The clinic phone number is [phone].  Please show the CHEMO ALERT CARD at check-in to the Emergency Department and triage nurse.  Doxorubicin injection What is this medicine? DOXORUBICIN (dox oh ROO bi sin) is a chemotherapy drug. It is used to treat many kinds of cancer like leukemia, lymphoma, neuroblastoma, sarcoma, and Wilms' tumor. It is also used to treat bladder cancer, breast cancer, lung cancer, ovarian cancer, stomach cancer, and thyroid cancer. This medicine may be used for other purposes; ask your health care provider or pharmacist if you have questions. COMMON BRAND NAME(S): Adriamycin, Adriamycin PFS, Adriamycin RDF, Rubex What should I tell my health care provider before I take this medicine? They need to know if you have any of these conditions: -heart disease -history of low blood counts caused by a medicine -liver disease -recent or ongoing radiation therapy -an unusual or allergic reaction  to doxorubicin, other chemotherapy agents, other medicines, foods, dyes, or preservatives -pregnant or trying to get pregnant -breast-feeding How should I use this medicine? This drug is given as an infusion into a vein. It is administered in a hospital or clinic by a specially trained health care professional. If you have pain, swelling, burning or any unusual feeling around the site of your injection, tell your health care professional right away. Talk to your pediatrician regarding the use of this medicine in children. Special care may be needed. Overdosage: If you think you have taken too much of this medicine contact a poison control center or emergency room at once. NOTE: This medicine is only for you. Do not share this medicine with others. What if I miss a dose? It is important not to miss your dose. Call your doctor or health care professional if you are unable to keep an appointment. What may interact with this medicine? This medicine may interact with the following medications: -6-mercaptopurine -paclitaxel -phenytoin -St. John's Wort -trastuzumab -verapamil This list may not describe all possible interactions. Give your health care provider a list of all the medicines, herbs, non-prescription drugs, or dietary supplements you use. Also tell them if you smoke, drink alcohol, or use illegal drugs. Some items may interact with your medicine. What should I watch for while using this medicine? This drug may make you feel generally unwell. This is not uncommon, as chemotherapy can affect healthy cells as well as cancer cells. Report any side effects. Continue your course of treatment even though you feel ill unless your doctor tells you to stop. There is a maximum amount of this medicine you should receive   throughout your life. The amount depends on the medical condition being treated and your overall health. Your doctor will watch how much of this medicine you receive in your lifetime.  Tell your doctor if you have taken this medicine before. You may need blood work done while you are taking this medicine. Your urine may turn red for a few days after your dose. This is not blood. If your urine is dark or brown, call your doctor. In some cases, you may be given additional medicines to help with side effects. Follow all directions for their use. Call your doctor or health care professional for advice if you get a fever, chills or sore throat, or other symptoms of a cold or flu. Do not treat yourself. This drug decreases your body's ability to fight infections. Try to avoid being around people who are sick. This medicine may increase your risk to bruise or bleed. Call your doctor or health care professional if you notice any unusual bleeding. Talk to your doctor about your risk of cancer. You may be more at risk for certain types of cancers if you take this medicine. Do not become pregnant while taking this medicine or for 6 months after stopping it. Women should inform their doctor if they wish to become pregnant or think they might be pregnant. Men should not father a child while taking this medicine and for 6 months after stopping it. There is a potential for serious side effects to an unborn child. Talk to your health care professional or pharmacist for more information. Do not breast-feed an infant while taking this medicine. This medicine has caused ovarian failure in some women and reduced sperm counts in some men This medicine may interfere with the ability to have a child. Talk with your doctor or health care professional if you are concerned about your fertility. This medicine may cause a decrease in Co-Enzyme Q-10. You should make sure that you get enough Co-Enzyme Q-10 while you are taking this medicine. Discuss the foods you eat and the vitamins you take with your health care professional. What side effects may I notice from receiving this medicine? Side effects that you should  report to your doctor or health care professional as soon as possible: -allergic reactions like skin rash, itching or hives, swelling of the face, lips, or tongue -breathing problems -chest pain -fast or irregular heartbeat -low blood counts - this medicine may decrease the number of white blood cells, red blood cells and platelets. You may be at increased risk for infections and bleeding. -pain, redness, or irritation at site where injected -signs of infection - fever or chills, cough, sore throat, pain or difficulty passing urine -signs of decreased platelets or bleeding - bruising, pinpoint red spots on the skin, black, tarry stools, blood in the urine -swelling of the ankles, feet, hands -tiredness -weakness Side effects that usually do not require medical attention (report to your doctor or health care professional if they continue or are bothersome): -diarrhea -hair loss -mouth sores -nail discoloration or damage -nausea -red colored urine -vomiting This list may not describe all possible side effects. Call your doctor for medical advice about side effects. You may report side effects to FDA at 1-800-FDA-1088. Where should I keep my medicine? This drug is given in a hospital or clinic and will not be stored at home. NOTE: This sheet is a summary. It may not cover all possible information. If you have questions about this medicine, talk to your doctor,   pharmacist, or health care provider.  2019 Elsevier/Gold Standard (2017-01-29 11:01:26)  Cyclophosphamide injection What is this medicine? CYCLOPHOSPHAMIDE (sye kloe FOSS fa mide) is a chemotherapy drug. It slows the growth of cancer cells. This medicine is used to treat many types of cancer like lymphoma, myeloma, leukemia, breast cancer, and ovarian cancer, to name a few. This medicine may be used for other purposes; ask your health care provider or pharmacist if you have questions. COMMON BRAND NAME(S): Cytoxan, Neosar What  should I tell my health care provider before I take this medicine? They need to know if you have any of these conditions: -blood disorders -history of other chemotherapy -infection -kidney disease -liver disease -recent or ongoing radiation therapy -tumors in the bone marrow -an unusual or allergic reaction to cyclophosphamide, other chemotherapy, other medicines, foods, dyes, or preservatives -pregnant or trying to get pregnant -breast-feeding How should I use this medicine? This drug is usually given as an injection into a vein or muscle or by infusion into a vein. It is administered in a hospital or clinic by a specially trained health care professional. Talk to your pediatrician regarding the use of this medicine in children. Special care may be needed. Overdosage: If you think you have taken too much of this medicine contact a poison control center or emergency room at once. NOTE: This medicine is only for you. Do not share this medicine with others. What if I miss a dose? It is important not to miss your dose. Call your doctor or health care professional if you are unable to keep an appointment. What may interact with this medicine? This medicine may interact with the following medications: -amiodarone -amphotericin B -azathioprine -certain antiviral medicines for HIV or AIDS such as protease inhibitors (e.g., indinavir, ritonavir) and zidovudine -certain blood pressure medications such as benazepril, captopril, enalapril, fosinopril, lisinopril, moexipril, monopril, perindopril, quinapril, ramipril, trandolapril -certain cancer medications such as anthracyclines (e.g., daunorubicin, doxorubicin), busulfan, cytarabine, paclitaxel, pentostatin, tamoxifen, trastuzumab -certain diuretics such as chlorothiazide, chlorthalidone, hydrochlorothiazide, indapamide, metolazone -certain medicines that treat or prevent blood clots like warfarin -certain muscle relaxants such as  succinylcholine -cyclosporine -etanercept -indomethacin -medicines to increase blood counts like filgrastim, pegfilgrastim, sargramostim -medicines used as general anesthesia -metronidazole -natalizumab This list may not describe all possible interactions. Give your health care provider a list of all the medicines, herbs, non-prescription drugs, or dietary supplements you use. Also tell them if you smoke, drink alcohol, or use illegal drugs. Some items may interact with your medicine. What should I watch for while using this medicine? Visit your doctor for checks on your progress. This drug may make you feel generally unwell. This is not uncommon, as chemotherapy can affect healthy cells as well as cancer cells. Report any side effects. Continue your course of treatment even though you feel ill unless your doctor tells you to stop. Drink water or other fluids as directed. Urinate often, even at night. In some cases, you may be given additional medicines to help with side effects. Follow all directions for their use. Call your doctor or health care professional for advice if you get a fever, chills or sore throat, or other symptoms of a cold or flu. Do not treat yourself. This drug decreases your body's ability to fight infections. Try to avoid being around people who are sick. This medicine may increase your risk to bruise or bleed. Call your doctor or health care professional if you notice any unusual bleeding. Be careful brushing and flossing your teeth   or using a toothpick because you may get an infection or bleed more easily. If you have any dental work done, tell your dentist you are receiving this medicine. You may get drowsy or dizzy. Do not drive, use machinery, or do anything that needs mental alertness until you know how this medicine affects you. Do not become pregnant while taking this medicine or for 1 year after stopping it. Women should inform their doctor if they wish to become  pregnant or think they might be pregnant. Men should not father a child while taking this medicine and for 4 months after stopping it. There is a potential for serious side effects to an unborn child. Talk to your health care professional or pharmacist for more information. Do not breast-feed an infant while taking this medicine. This medicine may interfere with the ability to have a child. This medicine has caused ovarian failure in some women. This medicine has caused reduced sperm counts in some men. You should talk with your doctor or health care professional if you are concerned about your fertility. If you are going to have surgery, tell your doctor or health care professional that you have taken this medicine. What side effects may I notice from receiving this medicine? Side effects that you should report to your doctor or health care professional as soon as possible: -allergic reactions like skin rash, itching or hives, swelling of the face, lips, or tongue -low blood counts - this medicine may decrease the number of white blood cells, red blood cells and platelets. You may be at increased risk for infections and bleeding. -signs of infection - fever or chills, cough, sore throat, pain or difficulty passing urine -signs of decreased platelets or bleeding - bruising, pinpoint red spots on the skin, black, tarry stools, blood in the urine -signs of decreased red blood cells - unusually weak or tired, fainting spells, lightheadedness -breathing problems -dark urine -dizziness -palpitations -swelling of the ankles, feet, hands -trouble passing urine or change in the amount of urine -weight gain -yellowing of the eyes or skin Side effects that usually do not require medical attention (report to your doctor or health care professional if they continue or are bothersome): -changes in nail or skin color -hair loss -missed menstrual periods -mouth sores -nausea, vomiting This list may not  describe all possible side effects. Call your doctor for medical advice about side effects. You may report side effects to FDA at 1-800-FDA-1088. Where should I keep my medicine? This drug is given in a hospital or clinic and will not be stored at home. NOTE: This sheet is a summary. It may not cover all possible information. If you have questions about this medicine, talk to your doctor, pharmacist, or health care provider.  2019 Elsevier/Gold Standard (2012-05-01 16:22:58)    

## 2018-07-10 ENCOUNTER — Telehealth: Payer: Self-pay | Admitting: Hematology and Oncology

## 2018-07-10 NOTE — Telephone Encounter (Signed)
Per 1/9 no los °

## 2018-07-11 ENCOUNTER — Inpatient Hospital Stay: Payer: BLUE CROSS/BLUE SHIELD

## 2018-07-11 VITALS — BP 123/74 | HR 78 | Temp 98.1°F | Resp 18

## 2018-07-11 DIAGNOSIS — C50411 Malignant neoplasm of upper-outer quadrant of right female breast: Secondary | ICD-10-CM | POA: Diagnosis not present

## 2018-07-11 DIAGNOSIS — Z95828 Presence of other vascular implants and grafts: Secondary | ICD-10-CM

## 2018-07-11 DIAGNOSIS — Z171 Estrogen receptor negative status [ER-]: Principal | ICD-10-CM

## 2018-07-11 MED ORDER — PROMETHAZINE HCL 25 MG/ML IJ SOLN
25.0000 mg | Freq: Four times a day (QID) | INTRAMUSCULAR | Status: DC | PRN
  Administered 2018-07-11: 25 mg via INTRAVENOUS

## 2018-07-11 MED ORDER — HEPARIN SOD (PORK) LOCK FLUSH 100 UNIT/ML IV SOLN
500.0000 [IU] | Freq: Once | INTRAVENOUS | Status: AC
  Administered 2018-07-11: 500 [IU] via INTRAVENOUS
  Filled 2018-07-11: qty 5

## 2018-07-11 MED ORDER — SODIUM CHLORIDE 0.9% FLUSH
10.0000 mL | INTRAVENOUS | Status: DC | PRN
  Administered 2018-07-11: 10 mL via INTRAVENOUS
  Filled 2018-07-11: qty 10

## 2018-07-11 MED ORDER — SODIUM CHLORIDE 0.9 % IV SOLN
INTRAVENOUS | Status: DC
  Administered 2018-07-11: 13:00:00 via INTRAVENOUS
  Filled 2018-07-11 (×2): qty 250

## 2018-07-11 MED ORDER — PEGFILGRASTIM-CBQV 6 MG/0.6ML ~~LOC~~ SOSY
PREFILLED_SYRINGE | SUBCUTANEOUS | Status: AC
  Filled 2018-07-11: qty 0.6

## 2018-07-11 MED ORDER — PEGFILGRASTIM-CBQV 6 MG/0.6ML ~~LOC~~ SOSY
6.0000 mg | PREFILLED_SYRINGE | Freq: Once | SUBCUTANEOUS | Status: AC
  Administered 2018-07-11: 6 mg via SUBCUTANEOUS

## 2018-07-11 NOTE — Progress Notes (Signed)
Patient reported to infusion room for Udenyca injection. She was activley vomiting. Patient reported she was suppose to have fluids along with her injection today. Writer called Dr. Lindi Adie for orders and phenergan IV. Patient reports improved symptoms after phenergan.

## 2018-07-11 NOTE — Patient Instructions (Signed)
Pegfilgrastim injection  What is this medicine?  PEGFILGRASTIM (PEG fil gra stim) is a long-acting granulocyte colony-stimulating factor that stimulates the growth of neutrophils, a type of white blood cell important in the body's fight against infection. It is used to reduce the incidence of fever and infection in patients with certain types of cancer who are receiving chemotherapy that affects the bone marrow, and to increase survival after being exposed to high doses of radiation.  This medicine may be used for other purposes; ask your health care provider or pharmacist if you have questions.  COMMON BRAND NAME(S): Fulphila, Neulasta, UDENYCA  What should I tell my health care provider before I take this medicine?  They need to know if you have any of these conditions:  -kidney disease  -latex allergy  -ongoing radiation therapy  -sickle cell disease  -skin reactions to acrylic adhesives (On-Body Injector only)  -an unusual or allergic reaction to pegfilgrastim, filgrastim, other medicines, foods, dyes, or preservatives  -pregnant or trying to get pregnant  -breast-feeding  How should I use this medicine?  This medicine is for injection under the skin. If you get this medicine at home, you will be taught how to prepare and give the pre-filled syringe or how to use the On-body Injector. Refer to the patient Instructions for Use for detailed instructions. Use exactly as directed. Tell your healthcare provider immediately if you suspect that the On-body Injector may not have performed as intended or if you suspect the use of the On-body Injector resulted in a missed or partial dose.  It is important that you put your used needles and syringes in a special sharps container. Do not put them in a trash can. If you do not have a sharps container, call your pharmacist or healthcare provider to get one.  Talk to your pediatrician regarding the use of this medicine in children. While this drug may be prescribed for  selected conditions, precautions do apply.  Overdosage: If you think you have taken too much of this medicine contact a poison control center or emergency room at once.  NOTE: This medicine is only for you. Do not share this medicine with others.  What if I miss a dose?  It is important not to miss your dose. Call your doctor or health care professional if you miss your dose. If you miss a dose due to an On-body Injector failure or leakage, a new dose should be administered as soon as possible using a single prefilled syringe for manual use.  What may interact with this medicine?  Interactions have not been studied.  Give your health care provider a list of all the medicines, herbs, non-prescription drugs, or dietary supplements you use. Also tell them if you smoke, drink alcohol, or use illegal drugs. Some items may interact with your medicine.  This list may not describe all possible interactions. Give your health care provider a list of all the medicines, herbs, non-prescription drugs, or dietary supplements you use. Also tell them if you smoke, drink alcohol, or use illegal drugs. Some items may interact with your medicine.  What should I watch for while using this medicine?  You may need blood work done while you are taking this medicine.  If you are going to need a MRI, CT scan, or other procedure, tell your doctor that you are using this medicine (On-Body Injector only).  What side effects may I notice from receiving this medicine?  Side effects that you should report to   your doctor or health care professional as soon as possible:  -allergic reactions like skin rash, itching or hives, swelling of the face, lips, or tongue  -back pain  -dizziness  -fever  -pain, redness, or irritation at site where injected  -pinpoint red spots on the skin  -red or dark-brown urine  -shortness of breath or breathing problems  -stomach or side pain, or pain at the shoulder  -swelling  -tiredness  -trouble passing urine or  change in the amount of urine  Side effects that usually do not require medical attention (report to your doctor or health care professional if they continue or are bothersome):  -bone pain  -muscle pain  This list may not describe all possible side effects. Call your doctor for medical advice about side effects. You may report side effects to FDA at 1-800-FDA-1088.  Where should I keep my medicine?  Keep out of the reach of children.  If you are using this medicine at home, you will be instructed on how to store it. Throw away any unused medicine after the expiration date on the label.  NOTE: This sheet is a summary. It may not cover all possible information. If you have questions about this medicine, talk to your doctor, pharmacist, or health care provider.   2019 Elsevier/Gold Standard (2017-09-22 16:57:08)

## 2018-07-13 ENCOUNTER — Encounter: Payer: Self-pay | Admitting: *Deleted

## 2018-07-14 ENCOUNTER — Ambulatory Visit
Admission: RE | Admit: 2018-07-14 | Discharge: 2018-07-14 | Disposition: A | Discharge status: Home / Self Care | Payer: BLUE CROSS/BLUE SHIELD | Source: Ambulatory Visit | Attending: General Surgery | Admitting: General Surgery

## 2018-07-14 ENCOUNTER — Other Ambulatory Visit: Payer: Self-pay

## 2018-07-14 DIAGNOSIS — R9389 Abnormal findings on diagnostic imaging of other specified body structures: Secondary | ICD-10-CM

## 2018-07-14 MED ORDER — GADOBUTROL 1 MMOL/ML IV SOLN
10.0000 mL | Freq: Once | INTRAVENOUS | Status: AC | PRN
  Administered 2018-07-14: 10 mL via INTRAVENOUS

## 2018-07-15 ENCOUNTER — Telehealth: Payer: Self-pay

## 2018-07-15 NOTE — Telephone Encounter (Signed)
Spoke with pt to discuss chemo follow up call. Pt reports feeling very sick after chemo treatment. Complains of severe heartburn, nausea and vomiting, decreased appetite, 4lb weight loss, severe fatigue and bone aches/pain. Pt denies fevers, chills, or diarrhea/constipation.   Pt states that she is following all directions for home medication management and still feels very sick. Pt is currently staying at her mom's house and had been in bed most of the time. Pt believes steroids had caused her to have severe heartburn. She currently is taking prilosec. Pt taking increased frequency of ativan and alternating compazine/zofran. Pt staying hydrated as possible. Pt mentioned that she had received IVF's last week and was given phenergan for nausea/vomiting, which helped with symptoms.   Confirmed appt follow up with pt tomorrow for nadir check. Will notify Dr.Gudena of symptoms and possibly make adjustments to her nausea medications at home. Pt verbalized understanding and appreciative of call.

## 2018-07-15 NOTE — Progress Notes (Signed)
Patient Care Team: [name], NP as PCP - General (Nurse Practitioner)  DIAGNOSIS:    ICD-10-CM   1. Malignant neoplasm of upper-outer quadrant of right breast in female, estrogen receptor negative ([place]) C50.411 dexamethasone (DECADRON) 4 MG tablet   Z17.1     SUMMARY OF ONCOLOGIC HISTORY:   Malignant neoplasm of upper-outer quadrant of right breast in female, estrogen receptor negative ([place])   06/22/2018 Initial Diagnosis    Screening mammogram detected right breast mass 1.1 cm at 10 o'clock position right breast 12 cm from the nipple, no abnormal lymph nodes, biopsy of the mass revealed IDC with DCIS grade 3, ER 0%, PR 0%, HER-2 -1+ by IHC, Ki-67 40%, T1c N0 stage Ib    06/29/2018 Cancer Staging    Staging form: Breast, AJCC 8th Edition - Clinical stage from 06/29/2018: Stage IB (cT1c, cN0, cM0, G3, ER-, PR-, HER2-) - Signed by Nicholas Lose, MD on 06/29/2018    07/09/2018 -  Neo-Adjuvant Chemotherapy     Neo- Adjuvant chemotherapy with dose dense Adriamycin and Cytoxan x4 followed by Taxol and carboplatin weekly x12     CHIEF COMPLIANT: Cycle 1 Day 8 of Adriamycin and Cytoxan  INTERVAL HISTORY: Holly Wiley is a 43 y.o. with above-mentioned history of triple negative right breast cancer. She presents to the clinic today with her mother for follow-up of Cycle 1 of Adriamycin and Cytoxan. Starting the day of treatment, she was severely nauseated and vomited at home. She was constantly drooling and occasionally vomiting. She returned on Saturday for her Udenyca shot and got fluids, after which she felt better and was able to sleep. The dexamethasone caused heart burn which worsened her vomiting. She was also severely constipated. She reports her mouth started to get sore but did not develop into sores. She completely lost her appetite and has lost 8-10 lbs in the last week but has continued drinking a lot of milk, orange juice, and Gatorade. She reviewed her medication list with  me.   REVIEW OF SYSTEMS:   Constitutional: Denies fevers, chills (+) loss of appetite (+) abnormal weight loss, 10lbs in 1wk Eyes: Denies blurriness of vision Ears, nose, mouth, throat, and face: Denies mucositis or sore throat (+) soreness in mouth Respiratory: Denies cough, dyspnea or wheezes Cardiovascular: Denies palpitation, chest discomfort Gastrointestinal:  Denies change in bowel habits (+) nausea (+) heartburn (+) vomiting (+) drooling (+) constipation Skin: Denies abnormal skin rashes Lymphatics: Denies new lymphadenopathy or easy bruising Neurological: Denies numbness, tingling or new weaknesses Behavioral/Psych: Mood is stable, no new changes  Extremities: No lower extremity edema Breast: denies any pain or lumps or nodules in either breasts All other systems were reviewed with the patient and are negative.  I have reviewed the past medical history, past surgical history, social history and family history with the patient and they are unchanged from previous note.  ALLERGIES:  is allergic to bee venom; dihydroergotamine; imitrex [sumatriptan]; metoclopramide; and transderm-scop [scopolamine].  MEDICATIONS:  Current Outpatient Medications  Medication Sig Dispense Refill  . dexamethasone (DECADRON) 4 MG tablet Take 1 tablet day after chemo and 1 tablet 2 days after chemo with food 6 tablet 0  . EPINEPHrine (EPIPEN) 0.3 mg/0.3 mL IJ SOAJ injection Inject 0.3 mg into the muscle once.     . lidocaine-prilocaine (EMLA) cream Apply to affected area once 30 g 3  . LORazepam (ATIVAN) 0.5 MG tablet Take 1 tablet (0.5 mg total) by mouth at bedtime as needed for sleep.  30 tablet 0  . magic mouthwash w/lidocaine SOLN Take 5 mLs by mouth 3 (three) times daily. 100 mL 0  . ondansetron (ZOFRAN) 8 MG tablet Take 1 tablet (8 mg total) by mouth 2 (two) times daily as needed. Start on the third day after chemotherapy. 30 tablet 1  . prochlorperazine (COMPAZINE) 10 MG tablet Take 1 tablet (10 mg  total) by mouth every 6 (six) hours as needed (Nausea or vomiting). 30 tablet 1  . promethazine (PHENERGAN) 25 MG tablet Take 1 tablet (25 mg total) by mouth every 6 (six) hours as needed for nausea. 30 tablet 3   No current facility-administered medications for this visit.     PHYSICAL EXAMINATION: ECOG PERFORMANCE STATUS: 1 - Symptomatic but completely ambulatory  Vitals:   07/16/18 1428  BP: 112/81  Pulse: 85  Resp: 17  Temp: 98.3 F (36.8 C)  SpO2: 98%   Filed Weights   07/16/18 1428  Weight: 190 lb 4.8 oz (86.3 kg)    GENERAL: alert, no distress and comfortable SKIN: skin color, texture, turgor are normal, no rashes or significant lesions EYES: normal, Conjunctiva are pink and non-injected, sclera clear OROPHARYNX: no exudate, no erythema and lips, buccal mucosa, and tongue normal  NECK: supple, thyroid normal size, non-tender, without nodularity LYMPH: no palpable lymphadenopathy in the cervical, axillary or inguinal LUNGS: clear to auscultation and percussion with normal breathing effort HEART: regular rate & rhythm and no murmurs and no lower extremity edema ABDOMEN: abdomen soft, non-tender and normal bowel sounds MUSCULOSKELETAL: no cyanosis of digits and no clubbing  NEURO: alert & oriented x 3 with fluent speech, no focal motor/sensory deficits EXTREMITIES: No lower extremity edema  LABORATORY DATA:  I have reviewed the data as listed CMP Latest Ref Rng & Units 07/16/2018 07/09/2018  Glucose 70 - 99 mg/dL 75 77  BUN 6 - 20 mg/dL 13 8  Creatinine 0.44 - 1.00 mg/dL 0.67 0.61  Sodium 135 - 145 mmol/L 136 139  Potassium 3.5 - 5.1 mmol/L 4.8 4.0  Chloride 98 - 111 mmol/L 100 108  CO2 22 - 32 mmol/L 31 24  Calcium 8.9 - 10.3 mg/dL 9.1 8.4(L)  Total Protein 6.5 - 8.1 g/dL 6.6 6.1(L)  Total Bilirubin 0.3 - 1.2 mg/dL 0.6 0.4  Alkaline Phos 38 - 126 U/L 67 45  AST 15 - 41 U/L 8(L) 13(L)  ALT 0 - 44 U/L 11 14    Lab Results  Component Value Date   WBC 2.4 (L)  07/16/2018   HGB 13.4 07/16/2018   HCT 40.0 07/16/2018   MCV 93.9 07/16/2018   PLT 126 (L) 07/16/2018   NEUTROABS 0.7 (L) 07/16/2018    ASSESSMENT & PLAN:  Malignant neoplasm of upper-outer quadrant of right breast in female, estrogen receptor negative ([place]) 06/22/2018: Screening mammogram detected right breast mass 1.1 cm at 10 o'clock position right breast 12 cm from the nipple, no abnormal lymph nodes, biopsy of the mass revealed IDC with DCIS grade 3, ER 0%, PR 0%, HER-2 -1+ by IHC, Ki-67 40%, T1c N0 stage Ib  Recommendation: 1.  Neo- Adjuvant chemotherapy with dose dense Adriamycin and Cytoxan x4 followed by Taxol and carboplatin weekly x12 2.  Patient prefers bilateral mastectomies plus or minus reconstruction Followed by surveillance --------------------------------------------------------------------------------------------------------------------------------- MRI breast 07/04/2018: Known malignancy right UOQ 1.4 cm, upper central portion right breast 0.7 cm, LIQ left breast 0.7 cm We discussed her case in tumor board and we will plan to biopsy the left breast. Because  she plans to have bilateral mastectomies the only discussion is whether she needs lymph node evaluation on the left breast and that is the reason why we plan to biopsy the left.  Current treatment: Cycle 1 day 8 dose dense Adriamycin and Cytoxan Chemo toxicities: 1.  Severe nausea and vomiting: Patient had to receive IV fluids and Phenergan.  For the next chemotherapy we will reduce the dosage of treatment and will also add Phenergan to her intravenous treatment.  I also gave her prescription for Phenergan. 2.  Mouth sores: Prescription for Magic mouthwash was given. 3.  Severe fatigue  Return to clinic in 1 week for cycle 2 of treatment    No orders of the defined types were placed in this encounter.  The patient has a good understanding of the overall plan. she agrees with it. she will call with any problems  that may develop before the next visit here.  Nicholas Lose, MD 07/16/2018  Julious Oka Dorshimer am acting as scribe for Dr. Nicholas Lose.  I have reviewed the above documentation for accuracy and completeness, and I agree with the above.

## 2018-07-15 NOTE — Telephone Encounter (Signed)
Called pt and lvm with call back number to discuss first time chemo treatment follow up.

## 2018-07-16 ENCOUNTER — Inpatient Hospital Stay: Payer: BLUE CROSS/BLUE SHIELD

## 2018-07-16 ENCOUNTER — Other Ambulatory Visit: Payer: Self-pay | Admitting: Genetics

## 2018-07-16 ENCOUNTER — Inpatient Hospital Stay (HOSPITAL_BASED_OUTPATIENT_CLINIC_OR_DEPARTMENT_OTHER): Payer: BLUE CROSS/BLUE SHIELD | Admitting: Hematology and Oncology

## 2018-07-16 DIAGNOSIS — C50411 Malignant neoplasm of upper-outer quadrant of right female breast: Secondary | ICD-10-CM

## 2018-07-16 DIAGNOSIS — N6324 Unspecified lump in the left breast, lower inner quadrant: Secondary | ICD-10-CM

## 2018-07-16 DIAGNOSIS — Z171 Estrogen receptor negative status [ER-]: Secondary | ICD-10-CM | POA: Diagnosis not present

## 2018-07-16 DIAGNOSIS — Z95828 Presence of other vascular implants and grafts: Secondary | ICD-10-CM | POA: Insufficient documentation

## 2018-07-16 DIAGNOSIS — K521 Toxic gastroenteritis and colitis: Secondary | ICD-10-CM | POA: Diagnosis not present

## 2018-07-16 DIAGNOSIS — R530 Neoplastic (malignant) related fatigue: Secondary | ICD-10-CM

## 2018-07-16 DIAGNOSIS — K1379 Other lesions of oral mucosa: Secondary | ICD-10-CM

## 2018-07-16 HISTORY — DX: Presence of other vascular implants and grafts: Z95.828

## 2018-07-16 LAB — CBC WITH DIFFERENTIAL (CANCER CENTER ONLY)
ABS IMMATURE GRANULOCYTES: 0.02 10*3/uL (ref 0.00–0.07)
Basophils Absolute: 0 10*3/uL (ref 0.0–0.1)
Basophils Relative: 1 %
Eosinophils Absolute: 0.2 10*3/uL (ref 0.0–0.5)
Eosinophils Relative: 9 %
HCT: 40 % (ref 36.0–46.0)
Hemoglobin: 13.4 g/dL (ref 12.0–15.0)
IMMATURE GRANULOCYTES: 1 %
Lymphocytes Relative: 55 %
Lymphs Abs: 1.4 10*3/uL (ref 0.7–4.0)
MCH: 31.5 pg (ref 26.0–34.0)
MCHC: 33.5 g/dL (ref 30.0–36.0)
MCV: 93.9 fL (ref 80.0–100.0)
Monocytes Absolute: 0.1 10*3/uL (ref 0.1–1.0)
Monocytes Relative: 6 %
NEUTROS PCT: 28 %
NRBC: 0 % (ref 0.0–0.2)
Neutro Abs: 0.7 10*3/uL — ABNORMAL LOW (ref 1.7–7.7)
Platelet Count: 126 10*3/uL — ABNORMAL LOW (ref 150–400)
RBC: 4.26 MIL/uL (ref 3.87–5.11)
RDW: 12.5 % (ref 11.5–15.5)
WBC Count: 2.4 10*3/uL — ABNORMAL LOW (ref 4.0–10.5)

## 2018-07-16 LAB — CMP (CANCER CENTER ONLY)
ALBUMIN: 3.7 g/dL (ref 3.5–5.0)
ALT: 11 U/L (ref 0–44)
AST: 8 U/L — ABNORMAL LOW (ref 15–41)
Alkaline Phosphatase: 67 U/L (ref 38–126)
Anion gap: 5 (ref 5–15)
BUN: 13 mg/dL (ref 6–20)
CO2: 31 mmol/L (ref 22–32)
Calcium: 9.1 mg/dL (ref 8.9–10.3)
Chloride: 100 mmol/L (ref 98–111)
Creatinine: 0.67 mg/dL (ref 0.44–1.00)
GFR, Est AFR Am: >60 mL/min (ref >60)
GFR, Estimated: >60 mL/min (ref >60)
Glucose, Bld: 75 mg/dL (ref 70–99)
Potassium: 4.8 mmol/L (ref 3.5–5.1)
Sodium: 136 mmol/L (ref 135–145)
Total Bilirubin: 0.6 mg/dL (ref 0.3–1.2)
Total Protein: 6.6 g/dL (ref 6.5–8.1)

## 2018-07-16 MED ORDER — HEPARIN SOD (PORK) LOCK FLUSH 100 UNIT/ML IV SOLN
500.0000 [IU] | Freq: Once | INTRAVENOUS | Status: DC | PRN
  Filled 2018-07-16: qty 5

## 2018-07-16 MED ORDER — PROMETHAZINE HCL 25 MG PO TABS: 25.0000 mg | ORAL_TABLET | Freq: Four times a day (QID) | ORAL | 3 refills | Status: DC | PRN

## 2018-07-16 MED ORDER — DEXAMETHASONE 4 MG PO TABS: ORAL_TABLET | ORAL | 0 refills | Status: DC

## 2018-07-16 MED ORDER — MAGIC MOUTHWASH W/LIDOCAINE: 5.0000 mL | Freq: Three times a day (TID) | ORAL | 0 refills | Status: DC

## 2018-07-16 MED ORDER — SODIUM CHLORIDE 0.9% FLUSH
10.0000 mL | INTRAVENOUS | Status: DC | PRN
  Filled 2018-07-16: qty 10

## 2018-07-16 NOTE — Assessment & Plan Note (Signed)
06/22/2018: Screening mammogram detected right breast mass 1.1 cm at 10 o'clock position right breast 12 cm from the nipple, no abnormal lymph nodes, biopsy of the mass revealed IDC with DCIS grade 3, ER 0%, PR 0%, HER-2 -1+ by IHC, Ki-67 40%, T1c N0 stage Ib  Recommendation: 1.  Neo- Adjuvant chemotherapy with dose dense Adriamycin and Cytoxan x4 followed by Taxol and carboplatin weekly x12 2.  Patient prefers bilateral mastectomies plus or minus reconstruction Followed by surveillance --------------------------------------------------------------------------------------------------------------------------------- MRI breast 07/04/2018: Known malignancy right UOQ 1.4 cm, upper central portion right breast 0.7 cm, LIQ left breast 0.7 cm We discussed her case in tumor board and we will plan to biopsy the left breast. Because she plans to have bilateral mastectomies the only discussion is whether she needs lymph node evaluation on the left breast and that is the reason why we plan to biopsy the left.  Current treatment: Cycle 1 day 8 dose dense Adriamycin and Cytoxan Chemo toxicities: 1.  Severe nausea and vomiting: Patient had to receive IV fluids and Phenergan.  For the next chemotherapy we will reduce the dosage of treatment and will also add Phenergan to her intravenous treatment.  I also gave her prescription for Phenergan. 2.  Mouth sores: Prescription for Magic mouthwash was given. 3.  Severe fatigue  Return to clinic in 1 week for cycle 2 of treatment

## 2018-07-16 NOTE — Progress Notes (Signed)
Pt had labs Drawn peripherally in lab [place] flush is not due at this time

## 2018-07-17 ENCOUNTER — Telehealth: Payer: Self-pay | Admitting: Hematology and Oncology

## 2018-07-17 NOTE — Telephone Encounter (Signed)
No los °

## 2018-07-21 ENCOUNTER — Encounter: Payer: Self-pay | Admitting: *Deleted

## 2018-07-21 ENCOUNTER — Inpatient Hospital Stay: Payer: BLUE CROSS/BLUE SHIELD | Admitting: Nutrition

## 2018-07-21 NOTE — Progress Notes (Signed)
43 year old female diagnosed with AAA-, ER negative breast cancer.   She is a patient of Dr. Sonny Dandy.  Past medical history includes migraines and seizure.  Medications include Ativan, Magic mouthwash, Zofran, Compazine, Phenergan, and Decadron.  Labs include albumin 3.7.  Height: 5 feet 7 inches. Weight: 190.3 pounds January 16. Usual body weight: 214 pounds in August 2018. BMI: 29.81.  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  She is currently a Merchandiser, retail in [place]. Reports extreme nausea and vomiting after her first chemotherapy.  She had days of nausea and dry heaves.  Reports some mouth sores. Increased fatigue. Patient reports she was able to stay relatively hydrated during periods of nausea and vomiting.  She was unable to eat very much except bites of dry bland foods throughout the day. Noted 10 pound weight loss over 1 week after chemotherapy.  Nutrition diagnosis:  Food and nutrition related knowledge deficit related to new diagnosis of breast cancer and associated treatments as evidenced by no prior need for nutrition related information.  Intervention: Educated patient on strategies to improve nausea with food and liquid intake.  Provided fact sheet on nausea and vomiting. Reviewed strategies for mouth sores and provided fact sheets. Reviewed strategies for taste alterations.  Fact sheets provided. Reviewed adding oral nutrition supplements.  Educated patient on strategies for improving taste and tolerance.  Strategies were provided. Questions were answered.  Teach back method used.  Contact information given.  Monitoring, evaluation, goals: Patient will tolerate adequate calories and protein to minimize loss of lean body mass.  Next visit: Thursday, January 23 during infusion.  **Disclaimer: This note was dictated with voice recognition software. Similar sounding words can inadvertently be transcribed and this  note may contain transcription errors which may not have been corrected upon publication of note.**

## 2018-07-22 NOTE — Progress Notes (Signed)
Patient Care Team: [name], NP as PCP - General (Nurse Practitioner)  DIAGNOSIS:    ICD-10-CM   1. Malignant neoplasm of upper-outer quadrant of right breast in female, estrogen receptor negative ([place]) C50.411    Z17.1     SUMMARY OF ONCOLOGIC HISTORY:   Malignant neoplasm of upper-outer quadrant of right breast in female, estrogen receptor negative ([place])   06/22/2018 Initial Diagnosis    Screening mammogram detected right breast mass 1.1 cm at 10 o'clock position right breast 12 cm from the nipple, no abnormal lymph nodes, biopsy of the mass revealed IDC with DCIS grade 3, ER 0%, PR 0%, HER-2 -1+ by IHC, Ki-67 40%, T1c N0 stage Ib    06/29/2018 Cancer Staging    Staging form: Breast, AJCC 8th Edition - Clinical stage from 06/29/2018: Stage IB (cT1c, cN0, cM0, G3, ER-, PR-, HER2-) - Signed by Nicholas Lose, MD on 06/29/2018    07/09/2018 -  Neo-Adjuvant Chemotherapy     Neo- Adjuvant chemotherapy with dose dense Adriamycin and Cytoxan x4 followed by Taxol and carboplatin weekly x12     CHIEF COMPLIANT: Cycle 2 Adriamycin and Cytoxan  INTERVAL HISTORY: Holly Wiley is a 43 y.o. with above-mentioned history of triple negative right breast cancer. She presents to the clinic today with her patients for Cycle 2 of dose dense Adriamycin and Cytoxan. She reports she is doing well. She did not take any phenergan pills after her last visit and has been taking mint and ginger tea in addition to supplements to calm her stomach. She took magic mouth wash for two days and the mouth sores have resolved. She gained about 3lbs back after losing 10lbs in a week. Her labs for genetics were taken this morning and she saw Dr. Iran Planas yesterday. She will get fluids on Saturday when she comes for her Udenyca shot.   REVIEW OF SYSTEMS:   Constitutional: Denies fevers, chills or abnormal weight loss Eyes: Denies blurriness of vision Ears, nose, mouth, throat, and face: Denies mucositis or sore  throat  Respiratory: Denies cough, dyspnea or wheezes Cardiovascular: Denies palpitation, chest discomfort Gastrointestinal:  Denies nausea, heartburn or change in bowel habits Skin: Denies abnormal skin rashes Lymphatics: Denies new lymphadenopathy or easy bruising Neurological: Denies numbness, tingling or new weaknesses Behavioral/Psych: Mood is stable, no new changes  Extremities: No lower extremity edema Breast: denies any pain or lumps or nodules in either breasts All other systems were reviewed with the patient and are negative.  I have reviewed the past medical history, past surgical history, social history and family history with the patient and they are unchanged from previous note.  ALLERGIES:  is allergic to bee venom; dihydroergotamine; imitrex [sumatriptan]; metoclopramide; and transderm-scop [scopolamine].  MEDICATIONS:  Current Outpatient Medications  Medication Sig Dispense Refill  . dexamethasone (DECADRON) 4 MG tablet Take 1 tablet day after chemo and 1 tablet 2 days after chemo with food 6 tablet 0  . EPINEPHrine (EPIPEN) 0.3 mg/0.3 mL IJ SOAJ injection Inject 0.3 mg into the muscle once.     . lidocaine-prilocaine (EMLA) cream Apply to affected area once 30 g 3  . LORazepam (ATIVAN) 0.5 MG tablet Take 1 tablet (0.5 mg total) by mouth at bedtime as needed for sleep. 30 tablet 0  . magic mouthwash w/lidocaine SOLN Take 5 mLs by mouth 3 (three) times daily. 100 mL 0  . ondansetron (ZOFRAN) 8 MG tablet Take 1 tablet (8 mg total) by mouth 2 (two) times daily as  needed. Start on the third day after chemotherapy. 30 tablet 1  . prochlorperazine (COMPAZINE) 10 MG tablet Take 1 tablet (10 mg total) by mouth every 6 (six) hours as needed (Nausea or vomiting). 30 tablet 1  . promethazine (PHENERGAN) 25 MG tablet Take 1 tablet (25 mg total) by mouth every 6 (six) hours as needed for nausea. 30 tablet 3   No current facility-administered medications for this visit.     Facility-Administered Medications Ordered in Other Visits  Medication Dose Route Frequency Provider Last Rate Last Dose  . sodium chloride flush (NS) 0.9 % injection 10 mL  10 mL Intracatheter PRN Nicholas Lose, MD   10 mL at 07/23/18 0815    PHYSICAL EXAMINATION: ECOG PERFORMANCE STATUS: 1 - Symptomatic but completely ambulatory  Vitals:   07/23/18 0837  BP: 112/73  Pulse: 73  Resp: 17  Temp: 97.7 F (36.5 C)  SpO2: 98%   Filed Weights   07/23/18 0837  Weight: 194 lb 14.4 oz (88.4 kg)    GENERAL: alert, no distress and comfortable SKIN: skin color, texture, turgor are normal, no rashes or significant lesions EYES: normal, Conjunctiva are pink and non-injected, sclera clear OROPHARYNX: no exudate, no erythema and lips, buccal mucosa, and tongue normal  NECK: supple, thyroid normal size, non-tender, without nodularity LYMPH: no palpable lymphadenopathy in the cervical, axillary or inguinal LUNGS: clear to auscultation and percussion with normal breathing effort HEART: regular rate & rhythm and no murmurs and no lower extremity edema ABDOMEN: abdomen soft, non-tender and normal bowel sounds MUSCULOSKELETAL: no cyanosis of digits and no clubbing  NEURO: alert & oriented x 3 with fluent speech, no focal motor/sensory deficits EXTREMITIES: No lower extremity edema  LABORATORY DATA:  I have reviewed the data as listed CMP Latest Ref Rng & Units 07/16/2018 07/09/2018  Glucose 70 - 99 mg/dL 75 77  BUN 6 - 20 mg/dL 13 8  Creatinine 0.44 - 1.00 mg/dL 0.67 0.61  Sodium 135 - 145 mmol/L 136 139  Potassium 3.5 - 5.1 mmol/L 4.8 4.0  Chloride 98 - 111 mmol/L 100 108  CO2 22 - 32 mmol/L 31 24  Calcium 8.9 - 10.3 mg/dL 9.1 8.4(L)  Total Protein 6.5 - 8.1 g/dL 6.6 6.1(L)  Total Bilirubin 0.3 - 1.2 mg/dL 0.6 0.4  Alkaline Phos 38 - 126 U/L 67 45  AST 15 - 41 U/L 8(L) 13(L)  ALT 0 - 44 U/L 11 14    Lab Results  Component Value Date   WBC 13.6 (H) 07/23/2018   HGB 13.4 07/23/2018    HCT 39.0 07/23/2018   MCV 93.5 07/23/2018   PLT 172 07/23/2018   NEUTROABS PENDING 07/23/2018    ASSESSMENT & PLAN:  Malignant neoplasm of upper-outer quadrant of right breast in female, estrogen receptor negative ([place]) 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%, T1c N0 stage Ib  Recommendation: 1.Neo-Adjuvant chemotherapy with dose dense Adriamycin and Cytoxan x4 followed by Taxol and carboplatinweekly x12 2.Patient prefers bilateral mastectomies plus or minus reconstruction Followed by surveillance --------------------------------------------------------------------------------------------------------------------------------- MRI breast 07/04/2018: Known malignancy right UOQ 1.4 cm, upper central portion right breast 0.7 cm, LIQ left breast 0.7 cm Left breast biopsy 07/14/2018: Fibrocystic changes  Current treatment: Cycle 2 dose dense Adriamycin and Cytoxan Chemo toxicities: 1.  Severe nausea and  vomiting with cycle 1: Dose reduced for cycle 2 and IV Phenergan has been added. 2.  Mouth sores: Improved 3.  Severe fatigue: Improved  Return to clinic in 2 weeks for cycle 3    No orders of the defined types were placed in this encounter.  The patient has a good understanding of the overall plan. she agrees with it. she will call with any problems that may develop before the next visit here.  Nicholas Lose, MD 07/23/2018  Julious Oka Dorshimer am acting as scribe for Dr. Nicholas Lose.  I have reviewed the above documentation for accuracy and completeness, and I agree with the above.

## 2018-07-23 ENCOUNTER — Inpatient Hospital Stay: Payer: BLUE CROSS/BLUE SHIELD

## 2018-07-23 ENCOUNTER — Inpatient Hospital Stay (HOSPITAL_BASED_OUTPATIENT_CLINIC_OR_DEPARTMENT_OTHER): Payer: BLUE CROSS/BLUE SHIELD | Admitting: Hematology and Oncology

## 2018-07-23 ENCOUNTER — Inpatient Hospital Stay (HOSPITAL_BASED_OUTPATIENT_CLINIC_OR_DEPARTMENT_OTHER): Payer: BLUE CROSS/BLUE SHIELD | Admitting: Genetics

## 2018-07-23 ENCOUNTER — Inpatient Hospital Stay: Payer: BLUE CROSS/BLUE SHIELD | Admitting: Nutrition

## 2018-07-23 ENCOUNTER — Encounter: Payer: Self-pay | Admitting: Genetics

## 2018-07-23 ENCOUNTER — Other Ambulatory Visit: Payer: Self-pay | Admitting: Hematology and Oncology

## 2018-07-23 DIAGNOSIS — N6324 Unspecified lump in the left breast, lower inner quadrant: Secondary | ICD-10-CM

## 2018-07-23 DIAGNOSIS — Z800 Family history of malignant neoplasm of digestive organs: Secondary | ICD-10-CM | POA: Diagnosis not present

## 2018-07-23 DIAGNOSIS — C50411 Malignant neoplasm of upper-outer quadrant of right female breast: Secondary | ICD-10-CM

## 2018-07-23 DIAGNOSIS — Z171 Estrogen receptor negative status [ER-]: Principal | ICD-10-CM

## 2018-07-23 DIAGNOSIS — Z803 Family history of malignant neoplasm of breast: Secondary | ICD-10-CM

## 2018-07-23 DIAGNOSIS — Z95828 Presence of other vascular implants and grafts: Secondary | ICD-10-CM

## 2018-07-23 DIAGNOSIS — Z8052 Family history of malignant neoplasm of bladder: Secondary | ICD-10-CM

## 2018-07-23 LAB — CMP (CANCER CENTER ONLY)
ALT: 13 U/L (ref 0–44)
AST: 11 U/L — ABNORMAL LOW (ref 15–41)
Albumin: 3.7 g/dL (ref 3.5–5.0)
Alkaline Phosphatase: 79 U/L (ref 38–126)
Anion gap: 7 (ref 5–15)
BUN: 12 mg/dL (ref 6–20)
CO2: 26 mmol/L (ref 22–32)
Calcium: 9 mg/dL (ref 8.9–10.3)
Chloride: 106 mmol/L (ref 98–111)
Creatinine: 0.67 mg/dL (ref 0.44–1.00)
GFR, Est AFR Am: >60 mL/min (ref >60)
GFR, Estimated: >60 mL/min (ref >60)
Glucose, Bld: 79 mg/dL (ref 70–99)
Potassium: 4.1 mmol/L (ref 3.5–5.1)
SODIUM: 139 mmol/L (ref 135–145)
Total Bilirubin: <0.2 mg/dL — ABNORMAL LOW (ref 0.3–1.2)
Total Protein: 6.6 g/dL (ref 6.5–8.1)

## 2018-07-23 LAB — CBC WITH DIFFERENTIAL (CANCER CENTER ONLY)
Abs Immature Granulocytes: 1.67 10*3/uL — ABNORMAL HIGH (ref 0.00–0.07)
Basophils Absolute: 0.3 10*3/uL — ABNORMAL HIGH (ref 0.0–0.1)
Basophils Relative: 2 %
EOS ABS: 0.2 10*3/uL (ref 0.0–0.5)
Eosinophils Relative: 1 %
HCT: 39 % (ref 36.0–46.0)
Hemoglobin: 13.4 g/dL (ref 12.0–15.0)
Immature Granulocytes: 12 %
LYMPHS ABS: 2.9 10*3/uL (ref 0.7–4.0)
Lymphocytes Relative: 21 %
MCH: 32.1 pg (ref 26.0–34.0)
MCHC: 34.4 g/dL (ref 30.0–36.0)
MCV: 93.5 fL (ref 80.0–100.0)
Monocytes Absolute: 0.9 10*3/uL (ref 0.1–1.0)
Monocytes Relative: 7 %
Neutro Abs: 7.7 10*3/uL (ref 1.7–7.7)
Neutrophils Relative %: 57 %
Platelet Count: 172 10*3/uL (ref 150–400)
RBC: 4.17 MIL/uL (ref 3.87–5.11)
RDW: 13.2 % (ref 11.5–15.5)
WBC Count: 13.6 10*3/uL — ABNORMAL HIGH (ref 4.0–10.5)
nRBC: 0.1 % (ref 0.0–0.2)

## 2018-07-23 MED ORDER — PALONOSETRON HCL INJECTION 0.25 MG/5ML
INTRAVENOUS | Status: AC
  Filled 2018-07-23: qty 5

## 2018-07-23 MED ORDER — SODIUM CHLORIDE 0.9 % IV SOLN
Freq: Once | INTRAVENOUS | Status: AC
  Administered 2018-07-23: 10:00:00 via INTRAVENOUS
  Filled 2018-07-23: qty 250

## 2018-07-23 MED ORDER — SODIUM CHLORIDE 0.9 % IV SOLN
500.0000 mg/m2 | Freq: Once | INTRAVENOUS | Status: AC
  Administered 2018-07-23: 1040 mg via INTRAVENOUS
  Filled 2018-07-23: qty 52

## 2018-07-23 MED ORDER — SODIUM CHLORIDE 0.9% FLUSH
10.0000 mL | INTRAVENOUS | Status: DC | PRN
  Administered 2018-07-23: 10 mL
  Filled 2018-07-23: qty 10

## 2018-07-23 MED ORDER — PROMETHAZINE HCL 25 MG/ML IJ SOLN
25.0000 mg | Freq: Four times a day (QID) | INTRAMUSCULAR | Status: DC | PRN
  Administered 2018-07-23: 25 mg via INTRAVENOUS

## 2018-07-23 MED ORDER — DOXORUBICIN HCL CHEMO IV INJECTION 2 MG/ML
50.0000 mg/m2 | Freq: Once | INTRAVENOUS | Status: AC
  Administered 2018-07-23: 104 mg via INTRAVENOUS
  Filled 2018-07-23: qty 52

## 2018-07-23 MED ORDER — PROMETHAZINE HCL 25 MG/ML IJ SOLN
INTRAMUSCULAR | Status: AC
  Filled 2018-07-23: qty 1

## 2018-07-23 MED ORDER — HEPARIN SOD (PORK) LOCK FLUSH 100 UNIT/ML IV SOLN
500.0000 [IU] | Freq: Once | INTRAVENOUS | Status: AC | PRN
  Administered 2018-07-23: 500 [IU]
  Filled 2018-07-23: qty 5

## 2018-07-23 MED ORDER — PALONOSETRON HCL INJECTION 0.25 MG/5ML
0.2500 mg | Freq: Once | INTRAVENOUS | Status: AC
  Administered 2018-07-23: 0.25 mg via INTRAVENOUS

## 2018-07-23 MED ORDER — SODIUM CHLORIDE 0.9 % IV SOLN
Freq: Once | INTRAVENOUS | Status: AC
  Administered 2018-07-23: 10:00:00 via INTRAVENOUS
  Filled 2018-07-23: qty 5

## 2018-07-23 NOTE — Progress Notes (Signed)
Brief nutrition follow up completed with patient during infusion for Breast cancer. Patient denies questions from previous visit on Jan 21. She is prepared to incorporate nausea strategies. Will continue to follow as needed.

## 2018-07-23 NOTE — Assessment & Plan Note (Signed)
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%, T1c N0 stage Ib  Recommendation: 1.Neo-Adjuvant chemotherapy with dose dense Adriamycin and Cytoxan x4 followed by Taxol and carboplatinweekly x12 2.Patient prefers bilateral mastectomies plus or minus reconstruction Followed by surveillance --------------------------------------------------------------------------------------------------------------------------------- MRI breast 07/04/2018: Known malignancy right UOQ 1.4 cm, upper central portion right breast 0.7 cm, LIQ left breast 0.7 cm Left breast biopsy 07/14/2018: Fibrocystic changes  Current treatment: Cycle 2 dose dense Adriamycin and Cytoxan Chemo toxicities: 1.  Severe nausea and vomiting with cycle 1: Dose reduced for cycle 2 and IV Phenergan has been added. 2.  Mouth sores: Improved 3.  Severe fatigue: Improved  Return to clinic in 2 weeks for cycle 3

## 2018-07-23 NOTE — Progress Notes (Signed)
REFERRING PROVIDER: Nicholas Lose, MD [address] [place], [place] [address]  PRIMARY PROVIDER:  [name], NP  PRIMARY REASON FOR VISIT:  1. Malignant neoplasm of upper-outer quadrant of right breast in female, estrogen receptor negative ([place])   2. Family history of breast cancer   3. Family history of colon cancer   4. Family history of bladder cancer   5. Family history of pancreatic cancer     HISTORY OF PRESENT ILLNESS:   Holly Wiley, a 43 y.o. female, was seen for a [place] cancer genetics consultation at the request of Dr. Lindi Adie due to a personal and family history of cancer.  Holly Wiley presents to clinic today to discuss the possibility of a hereditary predisposition to cancer, genetic testing, and to further clarify her future cancer risks, as well as potential cancer risks for family members.   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 She is currently undergoing neoadjuvant chemotherapy.  She currently is planing to have a bilateral mastectomy regardless of genetic test results.   CANCER HISTORY:    Malignant neoplasm of upper-outer quadrant of right breast in female, estrogen receptor negative ([place])   06/22/2018 Initial Diagnosis    Screening mammogram detected right breast mass 1.1 cm at 10 o'clock position right breast 12 cm from the nipple, no abnormal lymph nodes, biopsy of the mass revealed IDC with DCIS grade 3, ER 0%, PR 0%, HER-2 -1+ by IHC, Ki-67 40%, T1c N0 stage Ib    06/29/2018 Cancer Staging    Staging form: Breast, AJCC 8th Edition - Clinical stage from 06/29/2018: Stage IB (cT1c, cN0, cM0, G3, ER-, PR-, HER2-) - Signed by Nicholas Lose, MD on 06/29/2018    07/09/2018 -  Neo-Adjuvant Chemotherapy     Neo- Adjuvant chemotherapy with dose dense Adriamycin and Cytoxan x4 followed by Taxol and carboplatin weekly x12      HORMONAL RISK FACTORS:  Menarche was at  age 89.  First live birth at age N/A.  OCP use for approximately 21 years.  Colonoscopy: no; not examined.   Past Medical History:  Diagnosis Date  . Breast cancer ([place]) 07/2018   right  . Dental crown present   . Family history of bladder cancer   . Family history of breast cancer   . Family history of colon cancer   . Family history of pancreatic cancer   . 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  . Migraines   . PONV (postoperative nausea and vomiting)     Past Surgical History:  Procedure Laterality Date  . BREAST BIOPSY Right 11/08/2013   Procedure: EXCISION OF RIGHT  BREAST MASS;  Surgeon: Adin Hector, MD;  Location: [facility];  Service: General;  Laterality: Right;  . BUNIONECTOMY Left   . CARPAL TUNNEL RELEASE Right 02/26/2017  . CERVICAL CONE BIOPSY  05/2005  . COLONOSCOPY  2014  . PORTACATH PLACEMENT Right 07/08/2018   Procedure: INSERTION PORT-A-CATH;  Surgeon: Fanny Skates, MD;  Location: [facility];  Service: General;  Laterality: Right;  . SHOULDER ARTHROSCOPY W/ LABRAL REPAIR Right   . SHOULDER ARTHROSCOPY W/ ROTATOR CUFF REPAIR Left   . TENOTOMY FOREARM / WRIST Right 02/26/2017    Social History   Socioeconomic History  . Marital status: Divorced    Spouse name: Not on file  . Number of children: Not on  file  . Years of education: Not on file  . Highest education level: Not on file  Occupational History  . Not on file  Social Needs  . Financial resource strain: Not on file  . Food insecurity:    Worry: Not on file    Inability: Not on file  . Transportation needs:    Medical: Not on file    Non-medical: Not on file  Tobacco Use  . Smoking status: Never Smoker  . Smokeless tobacco: Never Used  Substance and Sexual Activity  . Alcohol use: Yes    Comment: occasionally  . Drug use: No  . Sexual activity: Not on file  Lifestyle  . Physical activity:    Days per week: Not  on file    Minutes per session: Not on file  . Stress: Not on file  Relationships  . Social connections:    Talks on phone: Not on file    Gets together: Not on file    Attends religious service: Not on file    Active member of club or organization: Not on file    Attends meetings of clubs or organizations: Not on file    Relationship status: Not on file  Other Topics Concern  . Not on file  Social History Narrative  . Not on file     FAMILY HISTORY:  We obtained a detailed, 4-generation family history.  Significant diagnoses are listed below: Family History  Problem Relation Age of Onset  . Breast cancer Mother 86       triple negative, GT neg  . Diabetes Maternal Aunt   . Bladder Cancer Maternal Aunt 69  . Colon cancer Maternal Uncle 51  . Diabetes Maternal Uncle   . Diabetes Maternal Grandmother   . Stroke Paternal Grandfather   . Colon cancer Other 22  . Breast cancer Other        dx >50  . Colon cancer Other        dx>50  . Pancreatic cancer Other     Holly Wiley has no children.  She has a 29 year-old sister with no hx of cancer.  She has 4 sons.   Holly Wiley father: 41, no hx of cancer.  Paternal aunts/Uncles: 1 paternal uncle- limited contact/limited info Paternal cousins: no known cancer, .limited info Paternal grandfather: deceased, stroke.  He has 3 nieces who have had cancer- pancreatic, breast, and breast.  Paternal grandmother:deceased, no hx of cancer. Her sister died of pancreatic cancer.   Ms. Ocanas mother: 37, hx of triple negative breast cancer.  She had genetic testing that was negative ([facility] cancer panel).  Maternal Aunts/Uncles: 1 maternal aunt had bladder cancer dx at 55 and 1 maternal uncle had colon cancer dx at 35.  Maternal cousins: 1 maternal cousin had uterine cancer and possibly also esophageal cancer.  Maternal grandfather: died of internal bleeding.  He had a sister who had breast cancer and a niece who had breast  cancer.  Maternal grandmother:died at 18 due to pneumonia.  She had a sister and a brother who had colon cancer dx >50.   Patient's maternal ancestors are of English/German descent, and paternal ancestors are of N. European descent. There is no reported Ashkenazi Jewish ancestry. There is no known consanguinity.  GENETIC COUNSELING ASSESSMENT: Holly Wiley is a 43 y.o. female with a personal and family history which is somewhat suggestive of a Hereditary Cancer Predisposition Syndrome. We, therefore, discussed and recommended the following  at today's visit.   DISCUSSION: We reviewed the characteristics, features and inheritance patterns of hereditary cancer syndromes. We also discussed genetic testing, including the appropriate family members to test, the process of testing, insurance coverage and turn-around-time for results. We discussed the implications of a negative, positive and/or variant of uncertain significant result. We recommended Holly Wiley pursue genetic testing for the [facility] gene panel with plans to reaccession to [facility] + RNA[facility]ight (80 gene panel).   [facility] Cancer + RNAinsight gene panel (80 genes):AIP, ALK, APC, ATM, AXIN2, BAP1, BARD1, BLM, BMPR1A, BRCA1,BRCA2, BRIP1, CASR, CDC73, CDH1, CDK4, CDKN1B, CDKN2A,CHEK2, CPA1, CTNNA1, CTRC, DICER1, EGFR, EPCAM, FANCC, FH, FLCN, GALNT12, GREM1, HOXB13, KIT, MAX, MEN1, MET, MITF, MLH1, MRE11A, MSH2, MSH3, MSH6, MUTYH, NBN, NF1, NF2, NTHL1, PALB2, PDGFRA, PHOX2B, PMS2, POLD1, POLE, POT1, PRKAR1A, PRSS1, PTCH1, PTEN, RAD50, RAD51C, RAD51D, RB1, RET, SDHA, SDHAF2, SDHB, SDHC, SDHD, SMAD4, SMARCA4, SMARCB1, SMARCE1, SPINK1, STK11, SUFU, TMEM127, TP53, TSC1, TSC2, VHL, XRCC2.   We discussed that only 5-10% of cancers are associated with a Hereditary cancer predisposition syndrome.  One of the most common hereditary cancer syndromes that increases breast cancer risk is called Hereditary Breast and Ovarian Cancer (HBOC) syndrome.  This  syndrome is caused by mutations in the BRCA1 and BRCA2 genes.  This syndrome increases an individual's lifetime risk to develop breast, ovarian, pancreatic, and other types of cancer.  There are also many other cancer predisposition syndromes caused by mutations in several other genes.  We discussed that if she is found to have a mutation in one of these genes, it may impact surgical decisions, and alter future medical management recommendations such as increased cancer screenings and consideration of risk reducing surgeries.  A positive result could also have implications for the patient's family members.  A Negative result would mean we were unable to identify a hereditary component to her cancer, but does not rule out the possibility of a hereditary basis for her cancer.  There could be mutations that are undetectable by current technology, or in genes not yet tested or identified to increase cancer risk.    We discussed the potential to find a Variant of Uncertain Significance or VUS.  These are variants that have not yet been identified as pathogenic or benign, and it is unknown if this variant is associated with increased cancer risk or if this is a normal finding.  Most VUS's are reclassified to benign or likely benign.   It should not be used to make medical management decisions. With time, we suspect the lab will determine the significance of any VUS's identified if any.   Based on Holly Wiley's personal and family history of cancer, she meets medical criteria for genetic testing. Despite that she meets criteria, she may still have an out of pocket cost. The laboratory can provide her with an estimate of her OOP cost. she was given the contact information of the laboratory if she has further questions.   PLAN: After considering the risks, benefits, and limitations, Holly Wiley  provided informed consent to pursue genetic testing and the blood sample was sent to [facility] for analysis  of the BreastNext panel with plans to reaccession to [facility]. Results should be available within approximately 2-3 weeks' time, at which point they will be disclosed by telephone to Holly Wiley, as will any additional recommendations warranted by these results. Holly Wiley will receive a summary of her genetic counseling visit and a copy of her results once available. This information will also  be available in Epic. We encouraged Holly Wiley to remain in contact with cancer genetics annually so that we can continuously update the family history and inform her of any changes in cancer genetics and testing that may be of benefit for her family. Holly Wiley questions were answered to her satisfaction today. Our contact information was provided should additional questions or concerns arise.  Based on Holly Wiley's family history, her relatives with cancer may also consider having genetic counseling and testing. Holly Wiley will let us know if we can be of any assistance in coordinating genetic counseling and/or testing for this family member.   Lastly, we encouraged Holly Wiley to remain in contact with cancer genetics annually so that we can continuously update the family history and inform her of any changes in cancer genetics and testing that may be of benefit for this family.   Ms.  Wiley questions were answered to her satisfaction today. Our contact information was provided should additional questions or concerns arise. Thank you for the referral and allowing [place] to share in the care of your patient.   Tana Felts, MS, [facility] Certified Genetic Counselor .[email or web address] phone: [phone]  The patient was seen in the infusion room for a total of 20 minutes in face-to-face genetic counseling.  The patient was accompanied today by her mother.  This patient was discussed with Drs. Magrinat, Lindi Adie and/or Burr Medico who agrees with the above.

## 2018-07-23 NOTE — Patient Instructions (Signed)
[facility] Discharge Instructions for Patients Receiving Chemotherapy  Today you received the following chemotherapy agents: doxorubicin (Adriamycin) and cyclophosphamide (Cytoxan).   To help prevent nausea and vomiting after your treatment, we encourage you to take your nausea medication as prescribed by your physician.    If you develop nausea and vomiting that is not controlled by your nausea medication, call the clinic.   BELOW ARE SYMPTOMS THAT SHOULD BE REPORTED IMMEDIATELY:  *FEVER GREATER THAN 100.5 F  *CHILLS WITH OR WITHOUT FEVER  NAUSEA AND VOMITING THAT IS NOT CONTROLLED WITH YOUR NAUSEA MEDICATION  *UNUSUAL SHORTNESS OF BREATH  *UNUSUAL BRUISING OR BLEEDING  TENDERNESS IN MOUTH AND THROAT WITH OR WITHOUT PRESENCE OF ULCERS  *URINARY PROBLEMS  *BOWEL PROBLEMS  UNUSUAL RASH Items with * indicate a potential emergency and should be followed up as soon as possible.  Feel free to call the clinic should you have any questions or concerns. The clinic phone number is [phone].  Please show the CHEMO ALERT CARD at check-in to the Emergency Department and triage nurse.  Doxorubicin injection What is this medicine? DOXORUBICIN (dox oh ROO bi sin) is a chemotherapy drug. It is used to treat many kinds of cancer like leukemia, lymphoma, neuroblastoma, sarcoma, and Wilms' tumor. It is also used to treat bladder cancer, breast cancer, lung cancer, ovarian cancer, stomach cancer, and thyroid cancer. This medicine may be used for other purposes; ask your health care provider or pharmacist if you have questions. COMMON BRAND NAME(S): Adriamycin, Adriamycin PFS, Adriamycin RDF, Rubex What should I tell my health care provider before I take this medicine? They need to know if you have any of these conditions: -heart disease -history of low blood counts caused by a medicine -liver disease -recent or ongoing radiation therapy -an unusual or allergic reaction  to doxorubicin, other chemotherapy agents, other medicines, foods, dyes, or preservatives -pregnant or trying to get pregnant -breast-feeding How should I use this medicine? This drug is given as an infusion into a vein. It is administered in a hospital or clinic by a specially trained health care professional. If you have pain, swelling, burning or any unusual feeling around the site of your injection, tell your health care professional right away. Talk to your pediatrician regarding the use of this medicine in children. Special care may be needed. Overdosage: If you think you have taken too much of this medicine contact a poison control center or emergency room at once. NOTE: This medicine is only for you. Do not share this medicine with others. What if I miss a dose? It is important not to miss your dose. Call your doctor or health care professional if you are unable to keep an appointment. What may interact with this medicine? This medicine may interact with the following medications: -6-mercaptopurine -paclitaxel -phenytoin -St. John's Wort -trastuzumab -verapamil This list may not describe all possible interactions. Give your health care provider a list of all the medicines, herbs, non-prescription drugs, or dietary supplements you use. Also tell them if you smoke, drink alcohol, or use illegal drugs. Some items may interact with your medicine. What should I watch for while using this medicine? This drug may make you feel generally unwell. This is not uncommon, as chemotherapy can affect healthy cells as well as cancer cells. Report any side effects. Continue your course of treatment even though you feel ill unless your doctor tells you to stop. There is a maximum amount of this medicine you should receive   throughout your life. The amount depends on the medical condition being treated and your overall health. Your doctor will watch how much of this medicine you receive in your lifetime.  Tell your doctor if you have taken this medicine before. You may need blood work done while you are taking this medicine. Your urine may turn red for a few days after your dose. This is not blood. If your urine is dark or brown, call your doctor. In some cases, you may be given additional medicines to help with side effects. Follow all directions for their use. Call your doctor or health care professional for advice if you get a fever, chills or sore throat, or other symptoms of a cold or flu. Do not treat yourself. This drug decreases your body's ability to fight infections. Try to avoid being around people who are sick. This medicine may increase your risk to bruise or bleed. Call your doctor or health care professional if you notice any unusual bleeding. Talk to your doctor about your risk of cancer. You may be more at risk for certain types of cancers if you take this medicine. Do not become pregnant while taking this medicine or for 6 months after stopping it. Women should inform their doctor if they wish to become pregnant or think they might be pregnant. Men should not father a child while taking this medicine and for 6 months after stopping it. There is a potential for serious side effects to an unborn child. Talk to your health care professional or pharmacist for more information. Do not breast-feed an infant while taking this medicine. This medicine has caused ovarian failure in some women and reduced sperm counts in some men This medicine may interfere with the ability to have a child. Talk with your doctor or health care professional if you are concerned about your fertility. This medicine may cause a decrease in Co-Enzyme Q-10. You should make sure that you get enough Co-Enzyme Q-10 while you are taking this medicine. Discuss the foods you eat and the vitamins you take with your health care professional. What side effects may I notice from receiving this medicine? Side effects that you should  report to your doctor or health care professional as soon as possible: -allergic reactions like skin rash, itching or hives, swelling of the face, lips, or tongue -breathing problems -chest pain -fast or irregular heartbeat -low blood counts - this medicine may decrease the number of white blood cells, red blood cells and platelets. You may be at increased risk for infections and bleeding. -pain, redness, or irritation at site where injected -signs of infection - fever or chills, cough, sore throat, pain or difficulty passing urine -signs of decreased platelets or bleeding - bruising, pinpoint red spots on the skin, black, tarry stools, blood in the urine -swelling of the ankles, feet, hands -tiredness -weakness Side effects that usually do not require medical attention (report to your doctor or health care professional if they continue or are bothersome): -diarrhea -hair loss -mouth sores -nail discoloration or damage -nausea -red colored urine -vomiting This list may not describe all possible side effects. Call your doctor for medical advice about side effects. You may report side effects to FDA at 1-800-FDA-1088. Where should I keep my medicine? This drug is given in a hospital or clinic and will not be stored at home. NOTE: This sheet is a summary. It may not cover all possible information. If you have questions about this medicine, talk to your doctor,   pharmacist, or health care provider.  2019 Elsevier/Gold Standard (2017-01-29 11:01:26)  Cyclophosphamide injection What is this medicine? CYCLOPHOSPHAMIDE (sye kloe FOSS fa mide) is a chemotherapy drug. It slows the growth of cancer cells. This medicine is used to treat many types of cancer like lymphoma, myeloma, leukemia, breast cancer, and ovarian cancer, to name a few. This medicine may be used for other purposes; ask your health care provider or pharmacist if you have questions. COMMON BRAND NAME(S): Cytoxan, Neosar What  should I tell my health care provider before I take this medicine? They need to know if you have any of these conditions: -blood disorders -history of other chemotherapy -infection -kidney disease -liver disease -recent or ongoing radiation therapy -tumors in the bone marrow -an unusual or allergic reaction to cyclophosphamide, other chemotherapy, other medicines, foods, dyes, or preservatives -pregnant or trying to get pregnant -breast-feeding How should I use this medicine? This drug is usually given as an injection into a vein or muscle or by infusion into a vein. It is administered in a hospital or clinic by a specially trained health care professional. Talk to your pediatrician regarding the use of this medicine in children. Special care may be needed. Overdosage: If you think you have taken too much of this medicine contact a poison control center or emergency room at once. NOTE: This medicine is only for you. Do not share this medicine with others. What if I miss a dose? It is important not to miss your dose. Call your doctor or health care professional if you are unable to keep an appointment. What may interact with this medicine? This medicine may interact with the following medications: -amiodarone -amphotericin B -azathioprine -certain antiviral medicines for HIV or AIDS such as protease inhibitors (e.g., indinavir, ritonavir) and zidovudine -certain blood pressure medications such as benazepril, captopril, enalapril, fosinopril, lisinopril, moexipril, monopril, perindopril, quinapril, ramipril, trandolapril -certain cancer medications such as anthracyclines (e.g., daunorubicin, doxorubicin), busulfan, cytarabine, paclitaxel, pentostatin, tamoxifen, trastuzumab -certain diuretics such as chlorothiazide, chlorthalidone, hydrochlorothiazide, indapamide, metolazone -certain medicines that treat or prevent blood clots like warfarin -certain muscle relaxants such as  succinylcholine -cyclosporine -etanercept -indomethacin -medicines to increase blood counts like filgrastim, pegfilgrastim, sargramostim -medicines used as general anesthesia -metronidazole -natalizumab This list may not describe all possible interactions. Give your health care provider a list of all the medicines, herbs, non-prescription drugs, or dietary supplements you use. Also tell them if you smoke, drink alcohol, or use illegal drugs. Some items may interact with your medicine. What should I watch for while using this medicine? Visit your doctor for checks on your progress. This drug may make you feel generally unwell. This is not uncommon, as chemotherapy can affect healthy cells as well as cancer cells. Report any side effects. Continue your course of treatment even though you feel ill unless your doctor tells you to stop. Drink water or other fluids as directed. Urinate often, even at night. In some cases, you may be given additional medicines to help with side effects. Follow all directions for their use. Call your doctor or health care professional for advice if you get a fever, chills or sore throat, or other symptoms of a cold or flu. Do not treat yourself. This drug decreases your body's ability to fight infections. Try to avoid being around people who are sick. This medicine may increase your risk to bruise or bleed. Call your doctor or health care professional if you notice any unusual bleeding. Be careful brushing and flossing your teeth   or using a toothpick because you may get an infection or bleed more easily. If you have any dental work done, tell your dentist you are receiving this medicine. You may get drowsy or dizzy. Do not drive, use machinery, or do anything that needs mental alertness until you know how this medicine affects you. Do not become pregnant while taking this medicine or for 1 year after stopping it. Women should inform their doctor if they wish to become  pregnant or think they might be pregnant. Men should not father a child while taking this medicine and for 4 months after stopping it. There is a potential for serious side effects to an unborn child. Talk to your health care professional or pharmacist for more information. Do not breast-feed an infant while taking this medicine. This medicine may interfere with the ability to have a child. This medicine has caused ovarian failure in some women. This medicine has caused reduced sperm counts in some men. You should talk with your doctor or health care professional if you are concerned about your fertility. If you are going to have surgery, tell your doctor or health care professional that you have taken this medicine. What side effects may I notice from receiving this medicine? Side effects that you should report to your doctor or health care professional as soon as possible: -allergic reactions like skin rash, itching or hives, swelling of the face, lips, or tongue -low blood counts - this medicine may decrease the number of white blood cells, red blood cells and platelets. You may be at increased risk for infections and bleeding. -signs of infection - fever or chills, cough, sore throat, pain or difficulty passing urine -signs of decreased platelets or bleeding - bruising, pinpoint red spots on the skin, black, tarry stools, blood in the urine -signs of decreased red blood cells - unusually weak or tired, fainting spells, lightheadedness -breathing problems -dark urine -dizziness -palpitations -swelling of the ankles, feet, hands -trouble passing urine or change in the amount of urine -weight gain -yellowing of the eyes or skin Side effects that usually do not require medical attention (report to your doctor or health care professional if they continue or are bothersome): -changes in nail or skin color -hair loss -missed menstrual periods -mouth sores -nausea, vomiting This list may not  describe all possible side effects. Call your doctor for medical advice about side effects. You may report side effects to FDA at 1-800-FDA-1088. Where should I keep my medicine? This drug is given in a hospital or clinic and will not be stored at home. NOTE: This sheet is a summary. It may not cover all possible information. If you have questions about this medicine, talk to your doctor, pharmacist, or health care provider.  2019 Elsevier/Gold Standard (2012-05-01 16:22:58)    

## 2018-07-24 ENCOUNTER — Other Ambulatory Visit: Payer: Self-pay

## 2018-07-24 ENCOUNTER — Other Ambulatory Visit: Payer: Self-pay | Admitting: Hematology and Oncology

## 2018-07-24 DIAGNOSIS — E860 Dehydration: Secondary | ICD-10-CM | POA: Insufficient documentation

## 2018-07-24 DIAGNOSIS — Z171 Estrogen receptor negative status [ER-]: Principal | ICD-10-CM

## 2018-07-24 DIAGNOSIS — R112 Nausea with vomiting, unspecified: Secondary | ICD-10-CM

## 2018-07-24 DIAGNOSIS — R110 Nausea: Secondary | ICD-10-CM

## 2018-07-24 DIAGNOSIS — C50411 Malignant neoplasm of upper-outer quadrant of right female breast: Secondary | ICD-10-CM

## 2018-07-24 HISTORY — DX: Nausea with vomiting, unspecified: R11.2

## 2018-07-24 HISTORY — DX: Nausea: R11.0

## 2018-07-24 HISTORY — DX: Dehydration: E86.0

## 2018-07-24 MED ORDER — LORAZEPAM 1 MG PO TABS: ORAL_TABLET | ORAL | 1 refills | Status: DC

## 2018-07-24 NOTE — Progress Notes (Signed)
Pt called to request increase in lorazepam dose for nausea/vomiting. Ok to change lorazepam to 1mg  per Dr.Gudena. Will fax to pharmacy. Pt coming in tomorrow for injection and ivf's.

## 2018-07-25 ENCOUNTER — Inpatient Hospital Stay: Payer: BLUE CROSS/BLUE SHIELD

## 2018-07-25 VITALS — BP 112/73 | HR 74 | Temp 97.8°F | Resp 16

## 2018-07-25 DIAGNOSIS — Z95828 Presence of other vascular implants and grafts: Secondary | ICD-10-CM

## 2018-07-25 DIAGNOSIS — C50411 Malignant neoplasm of upper-outer quadrant of right female breast: Secondary | ICD-10-CM

## 2018-07-25 DIAGNOSIS — R112 Nausea with vomiting, unspecified: Secondary | ICD-10-CM

## 2018-07-25 DIAGNOSIS — Z171 Estrogen receptor negative status [ER-]: Secondary | ICD-10-CM

## 2018-07-25 DIAGNOSIS — R110 Nausea: Secondary | ICD-10-CM

## 2018-07-25 DIAGNOSIS — E860 Dehydration: Secondary | ICD-10-CM

## 2018-07-25 MED ORDER — SODIUM CHLORIDE 0.9% FLUSH
10.0000 mL | INTRAVENOUS | Status: DC | PRN
  Administered 2018-07-25: 10 mL
  Filled 2018-07-25: qty 10

## 2018-07-25 MED ORDER — SODIUM CHLORIDE 0.9 % IV SOLN
Freq: Once | INTRAVENOUS | Status: AC
  Administered 2018-07-25: 10:00:00 via INTRAVENOUS
  Filled 2018-07-25: qty 250

## 2018-07-25 MED ORDER — PEGFILGRASTIM-CBQV 6 MG/0.6ML ~~LOC~~ SOSY
6.0000 mg | PREFILLED_SYRINGE | Freq: Once | SUBCUTANEOUS | Status: AC
  Administered 2018-07-25: 6 mg via SUBCUTANEOUS

## 2018-07-25 MED ORDER — PROMETHAZINE HCL 25 MG/ML IJ SOLN
12.5000 mg | Freq: Four times a day (QID) | INTRAMUSCULAR | Status: DC | PRN
  Administered 2018-07-25: 25 mg via INTRAVENOUS

## 2018-07-25 MED ORDER — PROMETHAZINE HCL 25 MG/ML IJ SOLN
INTRAMUSCULAR | Status: AC
  Filled 2018-07-25: qty 1

## 2018-07-25 MED ORDER — PEGFILGRASTIM-CBQV 6 MG/0.6ML ~~LOC~~ SOSY
PREFILLED_SYRINGE | SUBCUTANEOUS | Status: AC
  Filled 2018-07-25: qty 0.6

## 2018-07-25 MED ORDER — HEPARIN SOD (PORK) LOCK FLUSH 100 UNIT/ML IV SOLN
500.0000 [IU] | Freq: Once | INTRAVENOUS | Status: AC | PRN
  Administered 2018-07-25: 500 [IU]
  Filled 2018-07-25: qty 5

## 2018-07-25 NOTE — Patient Instructions (Signed)
Dehydration, Adult  Dehydration is when there is not enough fluid or water in your body. This happens when you lose more fluids than you take in. Dehydration can range from mild to very bad. It should be treated right away to keep it from getting very bad. Symptoms of mild dehydration may include:  Thirst.  Dry lips.  Slightly dry mouth.  Dry, warm skin.  Dizziness. Symptoms of moderate dehydration may include:  Very dry mouth.  Muscle cramps.  Dark pee (urine). Pee may be the color of tea.  Your body making less pee.  Your eyes making fewer tears.  Heartbeat that is uneven or faster than normal (palpitations).  Headache.  Light-headedness, especially when you stand up from sitting.  Fainting (syncope). Symptoms of very bad dehydration may include:  Changes in skin, such as: ? Cold and clammy skin. ? Blotchy (mottled) or pale skin. ? Skin that does not quickly return to normal after being lightly pinched and let go (poor skin turgor).  Changes in body fluids, such as: ? Feeling very thirsty. ? Your eyes making fewer tears. ? Not sweating when body temperature is high, such as in hot weather. ? Your body making very little pee.  Changes in vital signs, such as: ? Weak pulse. ? Pulse that is more than 100 beats a minute when you are sitting still. ? Fast breathing. ? Low blood pressure.  Other changes, such as: ? Sunken eyes. ? Cold hands and feet. ? Confusion. ? Lack of energy (lethargy). ? Trouble waking up from sleep. ? Short-term weight loss. ? Unconsciousness. Follow these instructions at home:   If told by your doctor, drink an ORS: ? Make an ORS by using instructions on the package. ? Start by drinking small amounts, about  cup (120 mL) every 5-10 minutes. ? Slowly drink more until you have had the amount that your doctor said to have.  Drink enough clear fluid to keep your pee clear or pale yellow. If you were told to drink an ORS, finish the  ORS first, then start slowly drinking clear fluids. Drink fluids such as: ? Water. Do not drink only water by itself. Doing that can make the salt (sodium) level in your body get too low (hyponatremia). ? Ice chips. ? Fruit juice that you have added water to (diluted). ? Low-calorie sports drinks.  Avoid: ? Alcohol. ? Drinks that have a lot of sugar. These include high-calorie sports drinks, fruit juice that does not have water added, and soda. ? Caffeine. ? Foods that are greasy or have a lot of fat or sugar.  Take over-the-counter and prescription medicines only as told by your doctor.  Do not take salt tablets. Doing that can make the salt level in your body get too high (hypernatremia).  Eat foods that have minerals (electrolytes). Examples include bananas, oranges, potatoes, tomatoes, and spinach.  Keep all follow-up visits as told by your doctor. This is important. Contact a doctor if:  You have belly (abdominal) pain that: ? Gets worse. ? Stays in one area (localizes).  You have a rash.  You have a stiff neck.  You get angry or annoyed more easily than normal (irritability).  You are more sleepy than normal.  You have a harder time waking up than normal.  You feel: ? Weak. ? Dizzy. ? Very thirsty.  You have peed (urinated) only a small amount of very dark pee during 6-8 hours. Get help right away if:  You have   symptoms of very bad dehydration.  You cannot drink fluids without throwing up (vomiting).  Your symptoms get worse with treatment.  You have a fever.  You have a very bad headache.  You are throwing up or having watery poop (diarrhea) and it: ? Gets worse. ? Does not go away.  You have blood or something green (bile) in your throw-up.  You have blood in your poop (stool). This may cause poop to look black and tarry.  You have not peed in 6-8 hours.  You pass out (faint).  Your heart rate when you are sitting still is more than 100 beats a  minute.  You have trouble breathing. This information is not intended to replace advice given to you by your health care provider. Make sure you discuss any questions you have with your health care provider. Document Released: 04/13/2009 Document Revised: 01/05/2016 Document Reviewed: 08/11/2015 Elsevier Interactive Patient Education  2019 [employer]vier Inc.  

## 2018-07-27 NOTE — Progress Notes (Signed)
Disability successfully faxed to [employer] at [phone]. Mailed copy to patient address on file.

## 2018-07-28 ENCOUNTER — Encounter: Payer: Self-pay | Admitting: Hematology and Oncology

## 2018-07-30 ENCOUNTER — Encounter: Payer: Self-pay | Admitting: Hematology and Oncology

## 2018-07-30 ENCOUNTER — Inpatient Hospital Stay: Payer: BLUE CROSS/BLUE SHIELD

## 2018-07-30 ENCOUNTER — Other Ambulatory Visit: Payer: Self-pay

## 2018-07-30 ENCOUNTER — Inpatient Hospital Stay (HOSPITAL_BASED_OUTPATIENT_CLINIC_OR_DEPARTMENT_OTHER): Payer: BLUE CROSS/BLUE SHIELD | Admitting: Medical

## 2018-07-30 VITALS — BP 109/74 | HR 84 | Temp 98.5°F | Resp 16 | Ht 67.0 in | Wt 193.1 lb

## 2018-07-30 DIAGNOSIS — E860 Dehydration: Secondary | ICD-10-CM

## 2018-07-30 DIAGNOSIS — R530 Neoplastic (malignant) related fatigue: Secondary | ICD-10-CM

## 2018-07-30 DIAGNOSIS — Z171 Estrogen receptor negative status [ER-]: Secondary | ICD-10-CM

## 2018-07-30 DIAGNOSIS — R112 Nausea with vomiting, unspecified: Secondary | ICD-10-CM

## 2018-07-30 DIAGNOSIS — N6324 Unspecified lump in the left breast, lower inner quadrant: Secondary | ICD-10-CM

## 2018-07-30 DIAGNOSIS — Z95828 Presence of other vascular implants and grafts: Secondary | ICD-10-CM

## 2018-07-30 DIAGNOSIS — R110 Nausea: Secondary | ICD-10-CM | POA: Diagnosis not present

## 2018-07-30 DIAGNOSIS — C50411 Malignant neoplasm of upper-outer quadrant of right female breast: Secondary | ICD-10-CM | POA: Diagnosis not present

## 2018-07-30 LAB — CMP (CANCER CENTER ONLY)
ALT: 12 U/L (ref 0–44)
ANION GAP: 7 (ref 5–15)
AST: 10 U/L — ABNORMAL LOW (ref 15–41)
Albumin: 3.8 g/dL (ref 3.5–5.0)
Alkaline Phosphatase: 84 U/L (ref 38–126)
BUN: 13 mg/dL (ref 6–20)
CALCIUM: 9.4 mg/dL (ref 8.9–10.3)
CO2: 30 mmol/L (ref 22–32)
Chloride: 102 mmol/L (ref 98–111)
Creatinine: 0.72 mg/dL (ref 0.44–1.00)
GFR, Est AFR Am: >60 mL/min (ref >60)
GFR, Estimated: >60 mL/min (ref >60)
Glucose, Bld: 85 mg/dL (ref 70–99)
Potassium: 4.8 mmol/L (ref 3.5–5.1)
Sodium: 139 mmol/L (ref 135–145)
Total Bilirubin: 0.5 mg/dL (ref 0.3–1.2)
Total Protein: 6.6 g/dL (ref 6.5–8.1)

## 2018-07-30 LAB — CBC WITH DIFFERENTIAL (CANCER CENTER ONLY)
Abs Immature Granulocytes: 0.04 10*3/uL (ref 0.00–0.07)
Basophils Absolute: 0.1 10*3/uL (ref 0.0–0.1)
Basophils Relative: 2 %
Eosinophils Absolute: 0.1 10*3/uL (ref 0.0–0.5)
Eosinophils Relative: 1 %
HEMATOCRIT: 38.3 % (ref 36.0–46.0)
Hemoglobin: 12.8 g/dL (ref 12.0–15.0)
IMMATURE GRANULOCYTES: 1 %
LYMPHS ABS: 1.3 10*3/uL (ref 0.7–4.0)
Lymphocytes Relative: 34 %
MCH: 31.8 pg (ref 26.0–34.0)
MCHC: 33.4 g/dL (ref 30.0–36.0)
MCV: 95.3 fL (ref 80.0–100.0)
MONOS PCT: 7 %
Monocytes Absolute: 0.3 10*3/uL (ref 0.1–1.0)
Neutro Abs: 2.2 10*3/uL (ref 1.7–7.7)
Neutrophils Relative %: 55 %
Platelet Count: 176 10*3/uL (ref 150–400)
RBC: 4.02 MIL/uL (ref 3.87–5.11)
RDW: 13 % (ref 11.5–15.5)
WBC Count: 3.9 10*3/uL — ABNORMAL LOW (ref 4.0–10.5)
nRBC: 0 % (ref 0.0–0.2)

## 2018-07-30 MED ORDER — HEPARIN SOD (PORK) LOCK FLUSH 100 UNIT/ML IV SOLN
500.0000 [IU] | Freq: Once | INTRAVENOUS | Status: AC | PRN
  Administered 2018-07-30: 500 [IU]
  Filled 2018-07-30: qty 5

## 2018-07-30 MED ORDER — ONDANSETRON HCL 4 MG/2ML IJ SOLN
4.0000 mg | Freq: Once | INTRAMUSCULAR | Status: AC
  Administered 2018-07-30: 4 mg via INTRAVENOUS

## 2018-07-30 MED ORDER — ONDANSETRON HCL 4 MG/2ML IJ SOLN
INTRAMUSCULAR | Status: AC
  Filled 2018-07-30: qty 2

## 2018-07-30 MED ORDER — SODIUM CHLORIDE 0.9 % IV SOLN
Freq: Once | INTRAVENOUS | Status: AC
  Administered 2018-07-30: 14:00:00 via INTRAVENOUS
  Filled 2018-07-30: qty 250

## 2018-07-30 MED ORDER — SODIUM CHLORIDE 0.9% FLUSH
10.0000 mL | INTRAVENOUS | Status: DC | PRN
  Administered 2018-07-30: 10 mL
  Filled 2018-07-30: qty 10

## 2018-07-30 NOTE — Progress Notes (Unsigned)
Spoke with pt after she had spoken to nurse educator nurse. Pt was trying to get in today to see SM due to low bp 88/40, irregular hr 100-110's, dizzy, sob, 10/10 fatigue level.   Pt denies fever, chills, chest pain, nausea, vomiting, urinary issues. Pt does have some constipation for a few days now.  Pt eating and drinking as much as she can with fluids and electrolytes. Suggested that she go to the ED if she feels very dizzy and sob and can't get up. Pt is at risk for fall. Pt refused and would like to come in the cancer center today, instead.  Scheduled pt for labs stat and to see Van,PA with symptom management. SM RN notified and aware of pt appt at 1:30pm.   Pt verbalized understanding and will be accompanied by her mother today.

## 2018-07-30 NOTE — Patient Instructions (Signed)

## 2018-08-05 ENCOUNTER — Other Ambulatory Visit: Payer: Self-pay | Admitting: Medical Oncology

## 2018-08-05 NOTE — Progress Notes (Signed)
[facility] Progress Note   RASHEMA SEAWRIGHT [id number] 1976/04/03 43 y.o.  GARA KINCADE is managed by Dr. Nicholas Lose  Actively treated with chemotherapy/immunotherapy/hormonal therapy: yes  Current Therapy: Adriamycin and Cytoxan with Udenyca support  Last Treated: 07/23/2018 (cycle 2, day 1)  Assessment: Plan:    Nausea without vomiting - Plan: 0.9 %  sodium chloride infusion, ondansetron (ZOFRAN) injection 4 mg, heparin lock flush 100 unit/mL, sodium chloride flush (NS) 0.9 % injection 10 mL  Dehydration - Plan: heparin lock flush 100 unit/mL, sodium chloride flush (NS) 0.9 % injection 10 mL  Nausea and vomiting, intractability of vomiting not specified, unspecified vomiting type - Plan: heparin lock flush 100 unit/mL, sodium chloride flush (NS) 0.9 % injection 10 mL  Port-A-Cath in place - Plan: heparin lock flush 100 unit/mL, sodium chloride flush (NS) 0.9 % injection 10 mL  Malignant neoplasm of upper-outer quadrant of right breast in female, estrogen receptor negative (HCC)   Nausea and vomiting and dehydration: The patient was given Zofran 4 mg IV and was given 1 L of normal saline today.  ER negative malignant neoplasm of the right breast: The patient is status post cycle 2, day 1 of Adriamycin and Cytoxan with the Udenyca support.  The patient was last dosed with chemotherapy on 07/23/2018.  She will be seen for her next treatment on 08/06/2018.  Please see After Visit Summary for patient specific instructions.  Future Appointments  Date Time Provider [place]  08/06/2018 11:00 AM [facility] LAB 2 [facility] None  08/06/2018 11:15 AM [facility] [place] FLUSH [facility] None  08/06/2018 11:45 AM Nicholas Lose, MD [facility] None  08/06/2018  1:00 PM [facility] INFUSION [facility] None  08/06/2018  1:45 PM Ernestene Kiel L, RD [facility] None  08/08/2018 12:30 PM [facility] [place] FLUSH [facility] None  08/20/2018  8:15 AM [facility] LAB 1 [facility] None   08/20/2018  8:45 AM [facility] [place] FLUSH [facility] None  08/20/2018  9:15 AM Nicholas Lose, MD [facility] None  08/20/2018 10:00 AM [facility] INFUSION [facility] None  08/20/2018 10:30 AM Karie Mainland, RD [facility] None    No orders of the defined types were placed in this encounter.      Subjective:   Patient ID:  LYNETTA TOMCZAK is a 43 y.o. (DOB 05-25-1976) female.  Chief Complaint: No chief complaint on file.   HPI ELIANE HAMMERSMITH is a 43 year old female with a diagnosis of an ER negative malignant neoplasm of the right breast.  She is managed by Dr. Nicholas Lose and is status post cycle 2, day 1 of Adriamycin and Cytoxan which was dosed on 07/23/2018.  She received Udenyca on 07/25/2018.  She presents to day with fatigue, body aches, difficulty sleeping, decreased p.o. intake except for fluids, episodic palpitations, dyspnea on exertion, dizziness, weakness, nausea, and vomiting.  Her GERD is better after decreasing her dexamethasone.  She denies fevers, chills, sweats, sore throat, shortness of breath, chest pain, constipation, or diarrhea.  Medications: I have reviewed the patient's current medications.  Allergies:  Allergies  Allergen Reactions  . Bee Venom Anaphylaxis  . Dihydroergotamine Anaphylaxis  . Imitrex [Sumatriptan] Other (See Comments)    SEIZURE-LIKE ACTIVITY  . Metoclopramide Other (See Comments)    TACHYCARDIA  . Transderm-Scop [Scopolamine] Other (See Comments)    AGITATION    Past Medical History:  Diagnosis Date  . Breast cancer ([place]) 07/2018   right  . Dental crown present   . Family history of bladder cancer   .  Family history of breast cancer   . Family history of colon cancer   . Family history of pancreatic cancer   . 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  . Migraines   . PONV (postoperative nausea and vomiting)     Past Surgical History:  Procedure Laterality Date  . BREAST BIOPSY Right  11/08/2013   Procedure: EXCISION OF RIGHT  BREAST MASS;  Surgeon: Adin Hector, MD;  Location: [facility];  Service: General;  Laterality: Right;  . BUNIONECTOMY Left   . CARPAL TUNNEL RELEASE Right 02/26/2017  . CERVICAL CONE BIOPSY  05/2005  . COLONOSCOPY  2014  . PORTACATH PLACEMENT Right 07/08/2018   Procedure: INSERTION PORT-A-CATH;  Surgeon: Fanny Skates, MD;  Location: [facility];  Service: General;  Laterality: Right;  . SHOULDER ARTHROSCOPY W/ LABRAL REPAIR Right   . SHOULDER ARTHROSCOPY W/ ROTATOR CUFF REPAIR Left   . TENOTOMY FOREARM / WRIST Right 02/26/2017    Family History  Problem Relation Age of Onset  . Breast cancer Mother 96       triple negative, GT neg  . Diabetes Maternal Aunt   . Bladder Cancer Maternal Aunt 69  . Colon cancer Maternal Uncle 20  . Diabetes Maternal Uncle   . Diabetes Maternal Grandmother   . Stroke Paternal Grandfather   . Colon cancer Other 48  . Breast cancer Other        dx >50  . Colon cancer Other        dx>50  . Pancreatic cancer Other     Social History   Socioeconomic History  . Marital status: Divorced    Spouse name: Not on file  . Number of children: Not on file  . Years of education: Not on file  . Highest education level: Not on file  Occupational History  . Not on file  Social Needs  . Financial resource strain: Not on file  . Food insecurity:    Worry: Not on file    Inability: Not on file  . Transportation needs:    Medical: Not on file    Non-medical: Not on file  Tobacco Use  . Smoking status: Never Smoker  . Smokeless tobacco: Never Used  Substance and Sexual Activity  . Alcohol use: Yes    Comment: occasionally  . Drug use: No  . Sexual activity: Not on file  Lifestyle  . Physical activity:    Days per week: Not on file    Minutes per session: Not on file  . Stress: Not on file  Relationships  . Social connections:    Talks on phone: Not on file    Gets  together: Not on file    Attends religious service: Not on file    Active member of club or organization: Not on file    Attends meetings of clubs or organizations: Not on file    Relationship status: Not on file  . Intimate partner violence:    Fear of current or ex partner: Not on file    Emotionally abused: Not on file    Physically abused: Not on file    Forced sexual activity: Not on file  Other Topics Concern  . Not on file  Social History Narrative  . Not on file    Past Medical History, Surgical history, Social history, and Family history were reviewed and updated as appropriate.   Please see review of systems for further  details on the patient's review from today.   Review of Systems:  Review of Systems  Constitutional: Positive for appetite change. Negative for chills, diaphoresis and fever.  HENT: Negative for trouble swallowing.   Respiratory: Negative for cough, choking, shortness of breath and wheezing.   Cardiovascular: Positive for palpitations. Negative for chest pain.  Gastrointestinal: Positive for nausea and vomiting. Negative for constipation and diarrhea.  Genitourinary: Negative for decreased urine volume.  Neurological: Positive for dizziness and weakness. Negative for headaches.    Objective:   Physical Exam:  BP 109/74 (BP Location: Left Arm, Patient Position: Sitting)   Pulse 84   Temp 98.5 F (36.9 C) (Oral)   Resp 16   Ht '5\' 7"'  (1.702 m)   Wt 193 lb 1.6 oz (87.6 kg)   SpO2 98%   BMI 30.24 kg/m  ECOG: 1  Physical Exam Constitutional:      General: She is not in acute distress.    Appearance: She is not diaphoretic.  HENT:     Head: Normocephalic and atraumatic.  Cardiovascular:     Rate and Rhythm: Normal rate and regular rhythm.     Heart sounds: Normal heart sounds. No murmur. No friction rub. No gallop.   Pulmonary:     Effort: Pulmonary effort is normal. No respiratory distress.     Breath sounds: Normal breath sounds. No  stridor. No wheezing or rales.  Abdominal:     General: Bowel sounds are normal. There is no distension.     Palpations: Abdomen is soft. There is no mass.     Tenderness: There is no abdominal tenderness. There is no guarding or rebound.  Skin:    General: Skin is warm and dry.  Neurological:     Mental Status: She is alert.     Gait: Gait normal.  Psychiatric:        Mood and Affect: Mood normal.        Behavior: Behavior normal.        Thought Content: Thought content normal.        Judgment: Judgment normal.     Lab Review:     Component Value Date/Time   NA 139 07/30/2018 1256   K 4.8 07/30/2018 1256   CL 102 07/30/2018 1256   CO2 30 07/30/2018 1256   GLUCOSE 85 07/30/2018 1256   BUN 13 07/30/2018 1256   CREATININE 0.72 07/30/2018 1256   CALCIUM 9.4 07/30/2018 1256   PROT 6.6 07/30/2018 1256   ALBUMIN 3.8 07/30/2018 1256   AST 10 (L) 07/30/2018 1256   ALT 12 07/30/2018 1256   ALKPHOS 84 07/30/2018 1256   BILITOT 0.5 07/30/2018 1256   GFRNONAA >60 07/30/2018 1256   GFRAA >60 07/30/2018 1256       Component Value Date/Time   WBC 3.9 (L) 07/30/2018 1256   WBC 2.8 (L) 10/01/2005 1330   RBC 4.02 07/30/2018 1256   HGB 12.8 07/30/2018 1256   HGB 14.0 10/01/2005 1330   HCT 38.3 07/30/2018 1256   HCT 39.9 10/01/2005 1330   PLT 176 07/30/2018 1256   PLT 219 10/01/2005 1330   MCV 95.3 07/30/2018 1256   MCV 92.3 10/01/2005 1330   MCH 31.8 07/30/2018 1256   MCHC 33.4 07/30/2018 1256   RDW 13.0 07/30/2018 1256   RDW 13.0 10/01/2005 1330   LYMPHSABS 1.3 07/30/2018 1256   LYMPHSABS 1.3 10/01/2005 1330   MONOABS 0.3 07/30/2018 1256   MONOABS 0.3 10/01/2005 1330   EOSABS 0.1 07/30/2018  1256   EOSABS 0.0 10/01/2005 1330   BASOSABS 0.1 07/30/2018 1256   BASOSABS 0.0 10/01/2005 1330   -------------------------------  Imaging from last 24 hours (if applicable):  Radiology interpretation: Dg Chest Port 1 View  Result Date: 07/08/2018 CLINICAL DATA:  Status post  Port-A-Cath insertion. EXAM: PORTABLE CHEST 1 VIEW COMPARISON:  08/26/2017 FINDINGS: Power port has been inserted. The tip is in the superior vena cava in good position 3 cm below the carina. No pneumothorax. Heart size and vascularity are normal. Lungs are clear. No acute bone abnormality. Prior labral surgery at the left shoulder. IMPRESSION: Port-A-Cath in good position. No pneumothorax. Electronically Signed   By: Lorriane Shire M.D.   On: 07/08/2018 13:32   Dg Fluoro Guide Cv Line-no Report  Result Date: 07/08/2018 Fluoroscopy was utilized by the requesting physician.  No radiographic interpretation.   Mm Clip Placement Left  Result Date: 07/14/2018 CLINICAL DATA:  Both status post MR guided core biopsy of enhancing mass in the LOWER INNER QUADRANT of the LEFT breast. EXAM: DIAGNOSTIC LEFT MAMMOGRAM POST MRI BIOPSY COMPARISON:  Previous exam(s). FINDINGS: Mammographic images were obtained following MRI guided biopsy of mass in the anterior LOWER INNER QUADRANT of the LEFT breast and placement of a barbell shaped clip. Clip is in expected location in the anterior LOWER INNER QUADRANT. IMPRESSION: Tissue marker clip in the expected location following biopsy. Final Assessment: Post Procedure Mammograms for Marker Placement Electronically Signed   By: Nolon Nations M.D.   On: 07/14/2018 09:29   Mr Aundra Millet Breast Bx Johnella Moloney Dev 1st Lesion Image Bx Spec Mr Guide  Addendum Date: 07/16/2018   ADDENDUM REPORT: 07/16/2018 07:32 ADDENDUM: Pathology revealed FIBROCYSTIC CHANGES of the LEFT breast, lower inner quadrant, anterior, (barbell clip). This was found to be concordant by Dr. Nolon Nations. Pathology results were discussed with the patient by telephone. The patient reported doing well after the biopsy with tenderness at the site. Post biopsy instructions and care were reviewed and questions were answered. The patient was encouraged to call [facility] for any additional concerns.  The patient has a recent diagnosis of RIGHT breast cancer and should follow her outlined treatment plan. Dr. Fanny Skates was notified of biopsy results via EPIC message on July 14, 2018. Pathology results reported by Terie Purser, RN on 07/16/2018. Electronically Signed   By: Nolon Nations M.D.   On: 07/16/2018 07:32   Result Date: 07/16/2018 CLINICAL DATA:  Patient presents for MR guided core biopsy of mass in the LOWER INNER QUADRANT of the LEFT breast. Recently diagnosed with RIGHT breast cancer. EXAM: MRI GUIDED CORE NEEDLE BIOPSY OF THE LEFT BREAST TECHNIQUE: Multiplanar, multisequence MR imaging of the LEFT breast was performed both before and after administration of intravenous contrast. CONTRAST:  10 ml Gadavist COMPARISON:  07/04/2018 and earlier FINDINGS: I met with the patient, and we discussed the procedure of MRI guided biopsy, including risks, benefits, and alternatives. Specifically, we discussed the risks of infection, bleeding, tissue injury, clip migration, and inadequate sampling. Informed, written consent was given. The usual time out protocol was performed immediately prior to the procedure. Using sterile technique, 1% Lidocaine, MRI guidance, and a 9 gauge vacuum assisted device, biopsy was performed of enhancing mass in the LOWER INNER QUADRANT of the LEFT breast using a LATERAL to MEDIAL approach. The patient was not able to lie prone to allow a MEDIAL approach, a secondary to pain at the site of a recently placed Port-A-Cath. At  the conclusion of the procedure, a barbell shaped tissue marker clip was deployed into the biopsy cavity. Follow-up 2-view mammogram was performed and dictated separately. IMPRESSION: MRI guided biopsy of LEFT breast mass.  No apparent complications. Electronically Signed: By: Nolon Nations M.D. On: 07/14/2018 09:16

## 2018-08-05 NOTE — Progress Notes (Signed)
Patient Care Team: [name], NP as PCP - General (Nurse Practitioner)  DIAGNOSIS:    ICD-10-CM   1. Malignant neoplasm of upper-outer quadrant of right breast in female, estrogen receptor negative ([place]) C50.411    Z17.1     SUMMARY OF ONCOLOGIC HISTORY:   Malignant neoplasm of upper-outer quadrant of right breast in female, estrogen receptor negative ([place])   06/22/2018 Initial Diagnosis    Screening mammogram detected right breast mass 1.1 cm at 10 o'clock position right breast 12 cm from the nipple, no abnormal lymph nodes, biopsy of the mass revealed IDC with DCIS grade 3, ER 0%, PR 0%, HER-2 -1+ by IHC, Ki-67 40%, T1c N0 stage Ib    06/29/2018 Cancer Staging    Staging form: Breast, AJCC 8th Edition - Clinical stage from 06/29/2018: Stage IB (cT1c, cN0, cM0, G3, ER-, PR-, HER2-) - Signed by Nicholas Lose, MD on 06/29/2018    07/09/2018 -  Neo-Adjuvant Chemotherapy     Neo- Adjuvant chemotherapy with dose dense Adriamycin and Cytoxan x4 followed by Taxol and carboplatin weekly x12     CHIEF COMPLIANT: Cycle 3 Adriamycin and Cytoxan  INTERVAL HISTORY: Holly Wiley is a 43 y.o. with above-mentioned history of triple negative right breast cancer. She presents to the clinic today with her mom for Cycle 3 of dose dense Adriamycin and Cytoxan. She tolerated her last treatment better and did not vomit, but reports she had nausea, dizziness, fatigue and tested her bp, which was 88/42. She ate and drank depite a loss of appetite and came to the [facility] clinic for fluids. She reports her heart as been pounding very hard, but denies tachycardia, and it has been preventing sleep. She took Ativan without relief and will double her dosage to help her sleep. Her heartburn has improved with cutting back on dexamethasone. Her mother's genetic testing was negative and her testing is still pending. Her lbawork from today shows: WBC 13.5, Hg 13.6, platelets 14.3.  REVIEW OF SYSTEMS:     Constitutional: Denies fevers, chills or abnormal weight loss (+) fatigue (+) dizziness (+) loss of appetite Eyes: Denies blurriness of vision Ears, nose, mouth, throat, and face: Denies mucositis or sore throat Respiratory: Denies cough, dyspnea or wheezes Cardiovascular: Denies chest discomfort (+) palpitation Gastrointestinal: Denies change in bowel habits (+) nausea (+) mild heartburn  Skin: Denies abnormal skin rashes Lymphatics: Denies new lymphadenopathy or easy bruising Neurological: Denies numbness, tingling or new weaknesses Behavioral/Psych: Mood is stable, no new changes (+) difficulty sleeping Extremities: No lower extremity edema Breast: denies any pain or lumps or nodules in either breasts All other systems were reviewed with the patient and are negative.  I have reviewed the past medical history, past surgical history, social history and family history with the patient and they are unchanged from previous note.  ALLERGIES:  is allergic to bee venom; dihydroergotamine; imitrex [sumatriptan]; metoclopramide; and transderm-scop [scopolamine].  MEDICATIONS:  Current Outpatient Medications  Medication Sig Dispense Refill  . dexamethasone (DECADRON) 4 MG tablet Take 1 tablet day after chemo and 1 tablet 2 days after chemo with food 6 tablet 0  . EPINEPHrine (EPIPEN) 0.3 mg/0.3 mL IJ SOAJ injection Inject 0.3 mg into the muscle once.     . lidocaine-prilocaine (EMLA) cream Apply to affected area once 30 g 3  . LORazepam (ATIVAN) 1 MG tablet Take 2 tablets (2 mg total) by mouth at bedtime as needed for anxiety. Take 1 mg tablet every 6-8hrs as needed  for nausea/vomiting. 60 tablet 1  . magic mouthwash w/lidocaine SOLN Take 5 mLs by mouth 3 (three) times daily. 100 mL 0  . ondansetron (ZOFRAN) 8 MG tablet Take 1 tablet (8 mg total) by mouth 2 (two) times daily as needed. Start on the third day after chemotherapy. 30 tablet 1  . prochlorperazine (COMPAZINE) 10 MG tablet Take 1  tablet (10 mg total) by mouth every 6 (six) hours as needed (Nausea or vomiting). 30 tablet 1  . prochlorperazine (COMPAZINE) 25 MG suppository prochlorperazine 25 mg rectal suppository  INSERT 1 SUPPOSITORY RECTALLY TWICE A DAY AS NEEDED FOR NAUSEA    . promethazine (PHENERGAN) 25 MG tablet Take 1 tablet (25 mg total) by mouth every 6 (six) hours as needed for nausea. 30 tablet 3   No current facility-administered medications for this visit.    Facility-Administered Medications Ordered in Other Visits  Medication Dose Route Frequency Provider Last Rate Last Dose  . cyclophosphamide (CYTOXAN) 1,040 mg in sodium chloride 0.9 % 250 mL chemo infusion  500 mg/m2 (Treatment Plan Recorded) Intravenous Once Nicholas Lose, MD      . DOXOrubicin (ADRIAMYCIN) chemo injection 104 mg  50 mg/m2 (Treatment Plan Recorded) Intravenous Once Nicholas Lose, MD      . fosaprepitant (EMEND) 150 mg, dexamethasone (DECADRON) 12 mg in sodium chloride 0.9 % 145 mL IVPB   Intravenous Once Nicholas Lose, MD      . heparin lock flush 100 unit/mL  500 Units Intracatheter Once PRN Nicholas Lose, MD      . palonosetron (ALOXI) injection 0.25 mg  0.25 mg Intravenous Once Nicholas Lose, MD      . promethazine (PHENERGAN) injection 25 mg  25 mg Intravenous Q6H PRN Nicholas Lose, MD      . sodium chloride flush (NS) 0.9 % injection 10 mL  10 mL Intracatheter PRN Nicholas Lose, MD        PHYSICAL EXAMINATION: ECOG PERFORMANCE STATUS: 1 - Symptomatic but completely ambulatory  Vitals:   08/06/18 1059  BP: 105/80  Pulse: 87  Resp: 18  Temp: 98 F (36.7 C)  SpO2: 97%   Filed Weights   08/06/18 1059  Weight: 190 lb 12.8 oz (86.5 kg)    GENERAL: alert, no distress and comfortable SKIN: skin color, texture, turgor are normal, no rashes or significant lesions EYES: normal, Conjunctiva are pink and non-injected, sclera clear OROPHARYNX: no exudate, no erythema and lips, buccal mucosa, and tongue normal  NECK: supple,  thyroid normal size, non-tender, without nodularity LYMPH: no palpable lymphadenopathy in the cervical, axillary or inguinal LUNGS: clear to auscultation and percussion with normal breathing effort HEART: regular rate & rhythm and no murmurs and no lower extremity edema ABDOMEN: abdomen soft, non-tender and normal bowel sounds MUSCULOSKELETAL: no cyanosis of digits and no clubbing  NEURO: alert & oriented x 3 with fluent speech, no focal motor/sensory deficits EXTREMITIES: No lower extremity edema  LABORATORY DATA:  I have reviewed the data as listed CMP Latest Ref Rng & Units 08/06/2018 07/30/2018 07/23/2018  Glucose 70 - 99 mg/dL 101(H) 85 79  BUN 6 - 20 mg/dL _0 Creatinine 0.44 - 1.00 mg/dL 0.78 0.72 0.67  Sodium 135 - 145 mmol/L 141 139 139  Potassium 3.5 - 5.1 mmol/L 3.8 4.8 4.1  Chloride 98 - 111 mmol/L 105 102 106  CO2 22 - 32 mmol/L _1 Calcium 8.9 - 10.3 mg/dL 9.6 9.4 9.0  Total Protein 6.5 - 8.1 g/dL 6.8  6.6 6.6  Total Bilirubin 0.3 - 1.2 mg/dL 0.5 0.5 <0.2(L)  Alkaline Phos 38 - 126 U/L 78 84 79  AST 15 - 41 U/L 13(L) 10(L) 11(L)  ALT 0 - 44 U/L 15 12 13    Lab Results  Component Value Date   WBC 13.5 (H) 08/06/2018   HGB 13.6 08/06/2018   HCT 39.9 08/06/2018   MCV 93.4 08/06/2018   PLT 208 08/06/2018   NEUTROABS 8.5 (H) 08/06/2018    ASSESSMENT & PLAN:  Malignant neoplasm of upper-outer quadrant of right breast in female, 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%, T1c N0 stage Ib  Recommendation: 1.Neo-Adjuvant chemotherapy with dose dense Adriamycin and Cytoxan x4 followed by Taxol and carboplatinweekly x12 2.Patient prefers bilateral mastectomies plus or minus reconstruction Followed by  surveillance --------------------------------------------------------------------------------------------------------------------------------- MRI breast 07/04/2018: Known malignancy right UOQ 1.4 cm, upper central portion right breast 0.7 cm, LIQ left breast 0.7 cm Left breast biopsy 07/14/2018: Fibrocystic changes  Current treatment: Cycle  3 dose dense Adriamycin and Cytoxan Chemo toxicities: 1.  Severe nausea and vomiting with cycle 1: Dose reduced for cycle 2 and IV Phenergan has been added.  This has helped her significantly.  However she still felt significant nausea.  If it continues then our option would be to add Zyprexa. 2.  Mouth sores: Improved 3.  Severe fatigue: Improved 4.  Heartburn is better  Return to clinic in 2 weeks for cycle 4    No orders of the defined types were placed in this encounter.  The patient has a good understanding of the overall plan. she agrees with it. she will call with any problems that may develop before the next visit here.  , , MD 08/06/2018  I, Molly Dorshimer am acting as scribe for Dr.  .  I have reviewed the above documentation for accuracy and completeness, and I agree with the above.       

## 2018-08-06 ENCOUNTER — Inpatient Hospital Stay: Payer: BLUE CROSS/BLUE SHIELD | Attending: Hematology and Oncology | Admitting: Hematology and Oncology

## 2018-08-06 ENCOUNTER — Inpatient Hospital Stay: Payer: BLUE CROSS/BLUE SHIELD

## 2018-08-06 ENCOUNTER — Inpatient Hospital Stay: Payer: BLUE CROSS/BLUE SHIELD | Admitting: Nutrition

## 2018-08-06 ENCOUNTER — Encounter: Payer: Self-pay | Admitting: *Deleted

## 2018-08-06 DIAGNOSIS — Z95828 Presence of other vascular implants and grafts: Secondary | ICD-10-CM

## 2018-08-06 DIAGNOSIS — C50411 Malignant neoplasm of upper-outer quadrant of right female breast: Secondary | ICD-10-CM

## 2018-08-06 DIAGNOSIS — Z171 Estrogen receptor negative status [ER-]: Principal | ICD-10-CM

## 2018-08-06 DIAGNOSIS — Z79899 Other long term (current) drug therapy: Secondary | ICD-10-CM | POA: Diagnosis not present

## 2018-08-06 DIAGNOSIS — R112 Nausea with vomiting, unspecified: Secondary | ICD-10-CM | POA: Diagnosis not present

## 2018-08-06 DIAGNOSIS — R110 Nausea: Secondary | ICD-10-CM | POA: Diagnosis not present

## 2018-08-06 DIAGNOSIS — Z7689 Persons encountering health services in other specified circumstances: Secondary | ICD-10-CM | POA: Insufficient documentation

## 2018-08-06 DIAGNOSIS — Z5111 Encounter for antineoplastic chemotherapy: Secondary | ICD-10-CM | POA: Insufficient documentation

## 2018-08-06 DIAGNOSIS — E860 Dehydration: Secondary | ICD-10-CM

## 2018-08-06 LAB — CBC WITH DIFFERENTIAL (CANCER CENTER ONLY)
Abs Immature Granulocytes: 1.81 10*3/uL — ABNORMAL HIGH (ref 0.00–0.07)
Basophils Absolute: 0.1 10*3/uL (ref 0.0–0.1)
Basophils Relative: 1 %
EOS ABS: 0.1 10*3/uL (ref 0.0–0.5)
Eosinophils Relative: 1 %
HCT: 39.9 % (ref 36.0–46.0)
Hemoglobin: 13.6 g/dL (ref 12.0–15.0)
IMMATURE GRANULOCYTES: 13 %
Lymphocytes Relative: 16 %
Lymphs Abs: 2.1 10*3/uL (ref 0.7–4.0)
MCH: 31.9 pg (ref 26.0–34.0)
MCHC: 34.1 g/dL (ref 30.0–36.0)
MCV: 93.4 fL (ref 80.0–100.0)
Monocytes Absolute: 0.8 10*3/uL (ref 0.1–1.0)
Monocytes Relative: 6 %
Neutro Abs: 8.5 10*3/uL — ABNORMAL HIGH (ref 1.7–7.7)
Neutrophils Relative %: 63 %
Platelet Count: 208 10*3/uL (ref 150–400)
RBC: 4.27 MIL/uL (ref 3.87–5.11)
RDW: 14.3 % (ref 11.5–15.5)
WBC Count: 13.5 10*3/uL — ABNORMAL HIGH (ref 4.0–10.5)
nRBC: 0.1 % (ref 0.0–0.2)

## 2018-08-06 LAB — CMP (CANCER CENTER ONLY)
ALT: 15 U/L (ref 0–44)
AST: 13 U/L — ABNORMAL LOW (ref 15–41)
Albumin: 3.9 g/dL (ref 3.5–5.0)
Alkaline Phosphatase: 78 U/L (ref 38–126)
Anion gap: 8 (ref 5–15)
BUN: 13 mg/dL (ref 6–20)
CO2: 28 mmol/L (ref 22–32)
Calcium: 9.6 mg/dL (ref 8.9–10.3)
Chloride: 105 mmol/L (ref 98–111)
Creatinine: 0.78 mg/dL (ref 0.44–1.00)
GFR, Est AFR Am: >60 mL/min (ref >60)
GFR, Estimated: >60 mL/min (ref >60)
Glucose, Bld: 101 mg/dL — ABNORMAL HIGH (ref 70–99)
Potassium: 3.8 mmol/L (ref 3.5–5.1)
SODIUM: 141 mmol/L (ref 135–145)
Total Bilirubin: 0.5 mg/dL (ref 0.3–1.2)
Total Protein: 6.8 g/dL (ref 6.5–8.1)

## 2018-08-06 MED ORDER — HEPARIN SOD (PORK) LOCK FLUSH 100 UNIT/ML IV SOLN
500.0000 [IU] | Freq: Once | INTRAVENOUS | Status: AC | PRN
  Administered 2018-08-06: 500 [IU]
  Filled 2018-08-06: qty 5

## 2018-08-06 MED ORDER — SODIUM CHLORIDE 0.9 % IV SOLN
Freq: Once | INTRAVENOUS | Status: AC
  Administered 2018-08-06: 12:00:00 via INTRAVENOUS
  Filled 2018-08-06: qty 250

## 2018-08-06 MED ORDER — SODIUM CHLORIDE 0.9 % IV SOLN
Freq: Once | INTRAVENOUS | Status: AC
  Administered 2018-08-06: 12:00:00 via INTRAVENOUS
  Filled 2018-08-06: qty 5

## 2018-08-06 MED ORDER — SODIUM CHLORIDE 0.9 % IV SOLN
500.0000 mg/m2 | Freq: Once | INTRAVENOUS | Status: AC
  Administered 2018-08-06: 1040 mg via INTRAVENOUS
  Filled 2018-08-06: qty 52

## 2018-08-06 MED ORDER — DOXORUBICIN HCL CHEMO IV INJECTION 2 MG/ML
50.0000 mg/m2 | Freq: Once | INTRAVENOUS | Status: AC
  Administered 2018-08-06: 104 mg via INTRAVENOUS
  Filled 2018-08-06: qty 52

## 2018-08-06 MED ORDER — LORAZEPAM 1 MG PO TABS: 2.0000 mg | ORAL_TABLET | Freq: Every evening | ORAL | 1 refills | Status: DC | PRN

## 2018-08-06 MED ORDER — SODIUM CHLORIDE 0.9% FLUSH
10.0000 mL | INTRAVENOUS | Status: DC | PRN
  Administered 2018-08-06: 10 mL
  Filled 2018-08-06: qty 10

## 2018-08-06 MED ORDER — PROMETHAZINE HCL 25 MG/ML IJ SOLN
25.0000 mg | Freq: Four times a day (QID) | INTRAMUSCULAR | Status: DC | PRN
  Administered 2018-08-06: 25 mg via INTRAVENOUS

## 2018-08-06 MED ORDER — PROMETHAZINE HCL 25 MG/ML IJ SOLN
INTRAMUSCULAR | Status: AC
  Filled 2018-08-06: qty 1

## 2018-08-06 MED ORDER — PALONOSETRON HCL INJECTION 0.25 MG/5ML
INTRAVENOUS | Status: AC
  Filled 2018-08-06: qty 5

## 2018-08-06 MED ORDER — PALONOSETRON HCL INJECTION 0.25 MG/5ML
0.2500 mg | Freq: Once | INTRAVENOUS | Status: AC
  Administered 2018-08-06: 0.25 mg via INTRAVENOUS

## 2018-08-06 NOTE — Patient Instructions (Signed)
[facility] Discharge Instructions for Patients Receiving Chemotherapy  Today you received the following chemotherapy agents Adriamycin and Cytoxan  To help prevent nausea and vomiting after your treatment, we encourage you to take your nausea medication as directed.  If you develop nausea and vomiting that is not controlled by your nausea medication, call the clinic.   BELOW ARE SYMPTOMS THAT SHOULD BE REPORTED IMMEDIATELY:  *FEVER GREATER THAN 100.5 F  *CHILLS WITH OR WITHOUT FEVER  NAUSEA AND VOMITING THAT IS NOT CONTROLLED WITH YOUR NAUSEA MEDICATION  *UNUSUAL SHORTNESS OF BREATH  *UNUSUAL BRUISING OR BLEEDING  TENDERNESS IN MOUTH AND THROAT WITH OR WITHOUT PRESENCE OF ULCERS  *URINARY PROBLEMS  *BOWEL PROBLEMS  UNUSUAL RASH Items with * indicate a potential emergency and should be followed up as soon as possible.  Feel free to call the clinic should you have any questions or concerns. The clinic phone number is [phone].  Please show the CHEMO ALERT CARD at check-in to the Emergency Department and triage nurse.   

## 2018-08-06 NOTE — Progress Notes (Signed)
Nutrition follow-up completed with patient during infusion for breast cancer. Patient reports she had nausea and vomiting but not near as bad as after her first treatment. She is having a hard time consuming protein shakes. Weight improved and documented as 193.1 pounds January 30 this is increased from 190.3 pounds January 16. She is having aversion to sweet foods.  Nutrition diagnosis: Food and nutrition related knowledge deficit improved.  Intervention: Reviewed strategies for improving taste alterations. Encourage patient to try to drink oral nutrition supplements. Provided support and encouragement.  Monitoring, evaluation, goals: Patient will tolerate adequate calories and protein to minimize further weight loss.  Next visit: Thursday, February 20.  **Disclaimer: This note was dictated with voice recognition software. Similar sounding words can inadvertently be transcribed and this note may contain transcription errors which may not have been corrected upon publication of note.**

## 2018-08-06 NOTE — Assessment & Plan Note (Addendum)
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%, T1c N0 stage Ib  Recommendation: 1.Neo-Adjuvant chemotherapy with dose dense Adriamycin and Cytoxan x4 followed by Taxol and carboplatinweekly x12 2.Patient prefers bilateral mastectomies plus or minus reconstruction Followed by surveillance --------------------------------------------------------------------------------------------------------------------------------- MRI breast 07/04/2018: Known malignancy right UOQ 1.4 cm, upper central portion right breast 0.7 cm, LIQ left breast 0.7 cm Left breast biopsy 07/14/2018: Fibrocystic changes  Current treatment: Cycle 3 dose dense Adriamycin and Cytoxan Chemo toxicities: 1.  Severe nausea and vomiting with cycle 1: Dose reduced for cycle 2 and IV Phenergan has been added.  This has helped her significantly.  However she still felt significant nausea.  If it continues then our option would be to add Zyprexa. 2.  Mouth sores: Improved 3.  Severe fatigue: Improved 4.  Heartburn is better  Return to clinic in 2 weeks for cycle 4

## 2018-08-08 ENCOUNTER — Inpatient Hospital Stay: Payer: BLUE CROSS/BLUE SHIELD

## 2018-08-08 VITALS — BP 107/71 | HR 85 | Temp 98.3°F | Resp 18

## 2018-08-08 DIAGNOSIS — E860 Dehydration: Secondary | ICD-10-CM

## 2018-08-08 DIAGNOSIS — C50411 Malignant neoplasm of upper-outer quadrant of right female breast: Secondary | ICD-10-CM

## 2018-08-08 DIAGNOSIS — Z95828 Presence of other vascular implants and grafts: Secondary | ICD-10-CM

## 2018-08-08 DIAGNOSIS — R110 Nausea: Secondary | ICD-10-CM

## 2018-08-08 DIAGNOSIS — R112 Nausea with vomiting, unspecified: Secondary | ICD-10-CM

## 2018-08-08 DIAGNOSIS — Z171 Estrogen receptor negative status [ER-]: Secondary | ICD-10-CM

## 2018-08-08 MED ORDER — PROMETHAZINE HCL 25 MG/ML IJ SOLN
12.5000 mg | Freq: Four times a day (QID) | INTRAMUSCULAR | Status: DC | PRN
  Administered 2018-08-08: 25 mg via INTRAVENOUS

## 2018-08-08 MED ORDER — HEPARIN SOD (PORK) LOCK FLUSH 100 UNIT/ML IV SOLN
500.0000 [IU] | Freq: Once | INTRAVENOUS | Status: AC | PRN
  Administered 2018-08-08: 500 [IU]
  Filled 2018-08-08: qty 5

## 2018-08-08 MED ORDER — PEGFILGRASTIM-CBQV 6 MG/0.6ML ~~LOC~~ SOSY
PREFILLED_SYRINGE | SUBCUTANEOUS | Status: AC
  Filled 2018-08-08: qty 0.6

## 2018-08-08 MED ORDER — PEGFILGRASTIM-CBQV 6 MG/0.6ML ~~LOC~~ SOSY
6.0000 mg | PREFILLED_SYRINGE | Freq: Once | SUBCUTANEOUS | Status: AC
  Administered 2018-08-08: 6 mg via SUBCUTANEOUS

## 2018-08-08 MED ORDER — SODIUM CHLORIDE 0.9 % IV SOLN
Freq: Once | INTRAVENOUS | Status: AC
  Administered 2018-08-08: 09:00:00 via INTRAVENOUS
  Filled 2018-08-08: qty 250

## 2018-08-08 MED ORDER — PROMETHAZINE HCL 25 MG/ML IJ SOLN
INTRAMUSCULAR | Status: AC
  Filled 2018-08-08: qty 1

## 2018-08-08 MED ORDER — ALTEPLASE 2 MG IJ SOLR
2.0000 mg | Freq: Once | INTRAMUSCULAR | Status: DC | PRN
  Filled 2018-08-08: qty 2

## 2018-08-08 MED ORDER — SODIUM CHLORIDE 0.9% FLUSH
10.0000 mL | INTRAVENOUS | Status: DC | PRN
  Administered 2018-08-08: 10 mL
  Filled 2018-08-08: qty 10

## 2018-08-08 NOTE — Patient Instructions (Signed)
Nausea and Vomiting, Adult Nausea is the feeling that you have an upset stomach or that you are about to vomit. Vomiting is when stomach contents are thrown up and out of the mouth as a result of nausea. Vomiting can make you feel weak and cause you to become dehydrated. Dehydration can make you feel tired and thirsty, cause you to have a dry mouth, and decrease how often you urinate. Older adults and people with other diseases or a weak disease-fighting system (immune system) are at higher risk for dehydration. It is important to treat your nausea and vomiting as told by your health care provider. Follow these instructions at home: Watch your symptoms for any changes. Tell your health care provider about them. Follow these instructions to care for yourself at home. Eating and drinking      Take an oral rehydration solution (ORS). This is a drink that is sold at pharmacies and retail stores.  Drink clear fluids slowly and in small amounts as you are able. Clear fluids include water, ice chips, low-calorie sports drinks, and fruit juice that has water added (diluted fruit juice).  Eat bland, easy-to-digest foods in small amounts as you are able. These foods include bananas, applesauce, rice, lean meats, toast, and crackers.  Avoid fluids that contain a lot of sugar or caffeine, such as energy drinks, sports drinks, and soda.  Avoid alcohol.  Avoid spicy or fatty foods. General instructions  Take over-the-counter and prescription medicines only as told by your health care provider.  Drink enough fluid to keep your urine pale yellow.  Wash your hands often using soap and water. If soap and water are not available, use hand sanitizer.  Make sure that all people in your household wash their hands well and often.  Rest at home while you recover.  Watch your condition for any changes.  Breathe slowly and deeply when you feel nauseated.  Keep all follow-up visits as told by your  health care provider. This is important. Contact a health care provider if:  Your symptoms get worse.  You have new symptoms.  You have a fever.  You cannot drink fluids without vomiting.  Your nausea does not go away after 2 days.  You feel light-headed or dizzy.  You have a headache.  You have muscle cramps.  You have a rash.  You have pain while urinating. Get help right away if:  You have pain in your chest, neck, arm, or jaw.  You feel extremely weak or you faint.  You have persistent vomiting.  You have vomit that is bright red or looks like black coffee grounds.  You have bloody or black stools or stools that look like tar.  You have a severe headache, a stiff neck, or both.  You have severe pain, cramping, or bloating in your abdomen.  You have difficulty breathing, or you are breathing very quickly.  Your heart is beating very quickly.  Your skin feels cold and clammy.  You feel confused.  You have signs of dehydration, such as: ? Dark urine, very little urine, or no urine. ? Cracked lips. ? Dry mouth. ? Sunken eyes. ? Sleepiness. ? Weakness. These symptoms may represent a serious problem that is an emergency. Do not wait to see if the symptoms will go away. Get medical help right away. Call your local emergency services (911 in the [place] Do not drive yourself to the hospital. Summary  Nausea is the feeling that you have an upset  stomach or that you are about to vomit. As nausea gets worse, it can lead to vomiting. Vomiting can make you feel weak and cause you to become dehydrated.  Follow instructions from your health care provider about eating and drinking to prevent dehydration.  Take over-the-counter and prescription medicines only as told by your health care provider.  Contact your health care provider if your symptoms get worse, or you have new symptoms.  Keep all follow-up visits as told by your health care provider. This is  important. This information is not intended to replace advice given to you by your health care provider. Make sure you discuss any questions you have with your health care provider. Document Released: 06/17/2005 Document Revised: 11/25/2017 Document Reviewed: 11/25/2017 Elsevier Interactive Patient Education  2019 [employer]   Dehydration, Adult  Dehydration is a condition in which there is not enough fluid or water in the body. This happens when you lose more fluids than you take in. Important organs, such as the kidneys, brain, and heart, cannot function without a proper amount of fluids. Any loss of fluids from the body can lead to dehydration. Dehydration can range from mild to severe. This condition should be treated right away to prevent it from becoming severe. What are the causes? This condition may be caused by:  Vomiting.  Diarrhea.  Excessive sweating, such as from heat exposure or exercise.  Not drinking enough fluid, especially: ? When ill. ? While doing activity that requires a lot of energy.  Excessive urination.  Fever.  Infection.  Certain medicines, such as medicines that cause the body to lose excess fluid (diuretics).  Inability to access safe drinking water.  Reduced physical ability to get adequate water and food. What increases the risk? This condition is more likely to develop in people:  Who have a poorly controlled long-term (chronic) illness, such as diabetes, heart disease, or kidney disease.  Who are age 64 or older.  Who are disabled.  Who live in a place with high altitude.  Who play endurance sports. What are the signs or symptoms? Symptoms of mild dehydration may include:  Thirst.  Dry lips.  Slightly dry mouth.  Dry, warm skin.  Dizziness. Symptoms of moderate dehydration may include:  Very dry mouth.  Muscle cramps.  Dark urine. Urine may be the color of tea.  Decreased urine production.  Decreased tear  production.  Heartbeat that is irregular or faster than normal (palpitations).  Headache.  Light-headedness, especially when you stand up from a sitting position.  Fainting (syncope). Symptoms of severe dehydration may include:  Changes in skin, such as: ? Cold and clammy skin. ? Blotchy (mottled) or pale skin. ? Skin that does not quickly return to normal after being lightly pinched and released (poor skin turgor).  Changes in body fluids, such as: ? Extreme thirst. ? No tear production. ? Inability to sweat when body temperature is high, such as in hot weather. ? Very little urine production.  Changes in vital signs, such as: ? Weak pulse. ? Pulse that is more than 100 beats a minute when sitting still. ? Rapid breathing. ? Low blood pressure.  Other changes, such as: ? Sunken eyes. ? Cold hands and feet. ? Confusion. ? Lack of energy (lethargy). ? Difficulty waking up from sleep. ? Short-term weight loss. ? Unconsciousness. How is this diagnosed? This condition is diagnosed based on your symptoms and a physical exam. Blood and urine tests may be done to help confirm  the diagnosis. How is this treated? Treatment for this condition depends on the severity. Mild or moderate dehydration can often be treated at home. Treatment should be started right away. Do not wait until dehydration becomes severe. Severe dehydration is an emergency and it needs to be treated in a hospital. Treatment for mild dehydration may include:  Drinking more fluids.  Replacing salts and minerals in your blood (electrolytes) that you may have lost. Treatment for moderate dehydration may include:  Drinking an oral rehydration solution (ORS). This is a drink that helps you replace fluids and electrolytes (rehydrate). It can be found at pharmacies and retail stores. Treatment for severe dehydration may include:  Receiving fluids through an IV tube.  Receiving an electrolyte solution through a  feeding tube that is passed through your nose and into your stomach (nasogastric tube, or NG tube).  Correcting any abnormalities in electrolytes.  Treating the underlying cause of dehydration. Follow these instructions at home:  If directed by your health care provider, drink an ORS: ? Make an ORS by following instructions on the package. ? Start by drinking small amounts, about  cup (120 mL) every 5-10 minutes. ? Slowly increase how much you drink until you have taken the amount recommended by your health care provider.  Drink enough clear fluid to keep your urine clear or pale yellow. If you were told to drink an ORS, finish the ORS first, then start slowly drinking other clear fluids. Drink fluids such as: ? Water. Do not drink only water. Doing that can lead to having too little salt (sodium) in the body (hyponatremia). ? Ice chips. ? Fruit juice that you have added water to (diluted fruit juice). ? Low-calorie sports drinks.  Avoid: ? Alcohol. ? Drinks that contain a lot of sugar. These include high-calorie sports drinks, fruit juice that is not diluted, and soda. ? Caffeine. ? Foods that are greasy or contain a lot of fat or sugar.  Take over-the-counter and prescription medicines only as told by your health care provider.  Do not take sodium tablets. This can lead to having too much sodium in the body (hypernatremia).  Eat foods that contain a healthy balance of electrolytes, such as bananas, oranges, potatoes, tomatoes, and spinach.  Keep all follow-up visits as told by your health care provider. This is important. Contact a health care provider if:  You have abdominal pain that: ? Gets worse. ? Stays in one area (localizes).  You have a rash.  You have a stiff neck.  You are more irritable than usual.  You are sleepier or more difficult to wake up than usual.  You feel weak or dizzy.  You feel very thirsty.  You have urinated only a small amount of very  dark urine over 6-8 hours. Get help right away if:  You have symptoms of severe dehydration.  You cannot drink fluids without vomiting.  Your symptoms get worse with treatment.  You have a fever.  You have a severe headache.  You have vomiting or diarrhea that: ? Gets worse. ? Does not go away.  You have blood or green matter (bile) in your vomit.  You have blood in your stool. This may cause stool to look black and tarry.  You have not urinated in 6-8 hours.  You faint.  Your heart rate while sitting still is over 100 beats a minute.  You have trouble breathing. This information is not intended to replace advice given to you by your health  care provider. Make sure you discuss any questions you have with your health care provider. Document Released: 06/17/2005 Document Revised: 01/12/2016 Document Reviewed: 08/11/2015 Elsevier Interactive Patient Education  2019 [employer].

## 2018-08-10 ENCOUNTER — Telehealth: Payer: Self-pay | Admitting: Genetics

## 2018-08-10 NOTE — Telephone Encounter (Signed)
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  We discussed this gene is associated with hereditary pancreatitis.  It is the chronic inflammation of the pancreas that leads to increased cancer risk.  If she were to ever have pancreatitis, it may be relevant and at that time may consider pancreatic cancer screening.  We discussed having one mutation  May increase risk for hereditary pancreatitis, but typically 2 mutations in SPINK1 would be more worrisome for hereditary pancreatitis.  Holly Wiley does not report any personal or family history of pancreatitis.   We discussed some factors that may help prevent pancreatitis episodes include- low fat diet, staying hydrated, cessation/abstinence from smoking and alcohol use.    We discussed VUS in APC.  We also discussed this result does not explain the family history of breast and other cancers.  The lab will offer free testing to her sister for this SPINK1 mutation for 90 days for free.  We also recommended her sister talk with her doctors about weather high risk breast screening is appropriate for her.  They can perform risk  Models to see if her lifetime risk exceeds 20%- in that case she may still be recommended to have increased breast surveillance even though a genetic mutation was found.  We discussed that if there was a mutation to find, it was more likely we would find it in [name] and her mother rather than her sister, so we felt it was unlikely genetic testing would uncover a hereditary breast caner gene in her sister, but certainly it is possible for her sister to carry a genetic mutation that Ms. Hoxworth and her mother did not inherit.  [name] sister (and parents) have a 50% chance to also carry the SPINK1 mutation- while it would not likely impact her medical care she can be tested.  If her children's father was also a carrier there may be a higher risk for her children to have hereditary pancreatitis if  they inherited 2 mutations.

## 2018-08-18 NOTE — Progress Notes (Signed)
Patient Care Team: [name], NP as PCP - General (Nurse Practitioner)  DIAGNOSIS:    ICD-10-CM   1. 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 with above-mentioned history of triple negative right breast cancer.She presents to the clinic todaywith her momfor Cycle 4 of dose dense Adriamycin and Cytoxan. Yesterday she began feeling poor and fatigued, but denies fever. She has nausea that comes and goes. She reports vomiting three times over the weekend. She is not interested in further medication for nausea. She reports heartburn resulting from the steroids. She has been menstruating frequently through treatment for about 80% of the time, bleeding through her clothing at night. She will follow-up with a gynecologist and is interested in Zoladex injections. When receiving Cytoxan she reports a burning sensation in her nose. She recently went to the dermatologist to remove a suspicious spot on her head. Her labs from today show Hg 12.5, ANC 8.0,  WBC 13.6, platelets 179.   REVIEW OF SYSTEMS:   Constitutional: Denies fevers, chills or abnormal weight loss (+) fatigue  Eyes: Denies blurriness of vision Ears, nose, mouth, throat, and face: Denies mucositis or sore throat (+)burning sensation in nose Respiratory: Denies cough, dyspnea or wheezes Cardiovascular: Denies palpitation, chest discomfort Gastrointestinal: Denies change in bowel habits (+) nausea (+) vomiting (+) heartburn  Skin: Denies abnormal skin rashes  GYN: (+) constant menstruation Lymphatics: Denies new lymphadenopathy or easy bruising Neurological: Denies numbness, tingling or new weaknesses Behavioral/Psych: Mood is stable, no new changes  Extremities: No lower extremity edema Breast: denies any pain or lumps or nodules in either breasts All other systems were reviewed with the patient and are negative.  I have reviewed the past medical history, past surgical history, social history and family history with the patient and they are unchanged from previous note.  ALLERGIES:  is allergic to bee venom; dihydroergotamine; imitrex [sumatriptan]; metoclopramide; and transderm-scop [scopolamine].  MEDICATIONS:  Current Outpatient Medications  Medication Sig Dispense Refill  . dexamethasone (DECADRON) 4 MG tablet Take 1 tablet day after chemo and 1 tablet 2 days after chemo with food 6 tablet 0  . EPINEPHrine (EPIPEN) 0.3 mg/0.3 mL IJ SOAJ injection Inject 0.3 mg into the muscle once.     . lidocaine-prilocaine (EMLA) cream Apply to affected area once 30 g 3  . LORazepam (ATIVAN) 1 MG tablet Take 2 tablets (2 mg total) by mouth at bedtime as needed for anxiety. Take 1 mg tablet every  6-8hrs as needed for nausea/vomiting. 60 tablet 1  . magic mouthwash w/lidocaine SOLN Take 5 mLs by mouth 3 (three) times daily. 100 mL 0  . ondansetron (ZOFRAN) 8 MG tablet Take 1 tablet (8 mg total) by mouth 2 (two) times daily as needed. Start on the third day after chemotherapy. 30 tablet  1  . prochlorperazine (COMPAZINE) 10 MG tablet Take 1 tablet (10 mg total) by mouth every 6 (six) hours as needed (Nausea or vomiting). 30 tablet 1  . prochlorperazine (COMPAZINE) 25 MG suppository prochlorperazine 25 mg rectal suppository  INSERT 1 SUPPOSITORY RECTALLY TWICE A DAY AS NEEDED FOR NAUSEA    . promethazine (PHENERGAN) 25 MG tablet Take 1 tablet (25 mg total) by mouth every 6 (six) hours as needed for nausea. 30 tablet 3   No current facility-administered medications for this visit.    Facility-Administered Medications Ordered in Other Visits  Medication Dose Route Frequency Provider Last Rate Last Dose  . sodium chloride flush (NS) 0.9 % injection 10 mL  10 mL Intracatheter PRN Nicholas Lose, MD   10 mL at 08/20/18 9937    PHYSICAL EXAMINATION: ECOG PERFORMANCE STATUS: 1 - Symptomatic but completely ambulatory  Vitals:   08/20/18 0856  BP: (!) 109/59  Pulse: 80  Resp: 18  Temp: 98.6 F (37 C)  SpO2: 98%   Filed Weights   08/20/18 0856  Weight: 192 lb 1.6 oz (87.1 kg)    GENERAL: alert, no distress and comfortable SKIN: skin color, texture, turgor are normal, no rashes or significant lesions EYES: normal, Conjunctiva are pink and non-injected, sclera clear OROPHARYNX: no exudate, no erythema and lips, buccal mucosa, and tongue normal  NECK: supple, thyroid normal size, non-tender, without nodularity LYMPH: no palpable lymphadenopathy in the cervical, axillary or inguinal LUNGS: clear to auscultation and percussion with normal breathing effort HEART: regular rate & rhythm and no murmurs and no lower extremity edema ABDOMEN: abdomen soft, non-tender and normal bowel sounds MUSCULOSKELETAL: no cyanosis of digits and no clubbing  NEURO: alert & oriented x 3 with fluent speech, no focal motor/sensory deficits EXTREMITIES: No lower extremity edema  LABORATORY DATA:  I have reviewed the data as listed CMP Latest Ref Rng & Units 08/20/2018 08/06/2018 07/30/2018  Glucose  70 - 99 mg/dL 80 101(H) 85  BUN 6 - 20 mg/dL _0 Creatinine 0.44 - 1.00 mg/dL 0.73 0.78 0.72  Sodium 135 - 145 mmol/L 140 141 139  Potassium 3.5 - 5.1 mmol/L 4.2 3.8 4.8  Chloride 98 - 111 mmol/L 105 105 102  CO2 22 - 32 mmol/L _1 Calcium 8.9 - 10.3 mg/dL 8.9 9.6 9.4  Total Protein 6.5 - 8.1 g/dL 6.7 6.8 6.6  Total Bilirubin 0.3 - 1.2 mg/dL 0.3 0.5 0.5  Alkaline Phos 38 - 126 U/L 74 78 84  AST 15 - 41 U/L 16 13(L) 10(L)  ALT 0 - 44 U/L _2 Lab Results  Component Value Date   WBC 13.6 (H) 08/20/2018   HGB 12.5 08/20/2018   HCT 36.5 08/20/2018   MCV 93.4 08/20/2018   PLT 179 08/20/2018   NEUTROABS 8.0 (H) 08/20/2018    ASSESSMENT & PLAN:  Malignant neoplasm of upper-outer quadrant of right breast in female, estrogen receptor negative ([place]) 06/22/2018:Screening mammogram detected right breast mass 1.1 cm at 10 o'clock position right breast 12 cm from the nipple, no abnormal lymph nodes, biopsy of the mass revealed IDC with DCIS grade  3, ER 0%, PR 0%, HER-2 -1+ by IHC, Ki-67 40%, T1c N0 stage Ib  Recommendation: 1.Neo-Adjuvant chemotherapy with dose dense Adriamycin and Cytoxan x4 followed by Taxol and carboplatinweekly x12 2.Patient prefers bilateral mastectomies plus or minus reconstruction Followed by surveillance --------------------------------------------------------------------------------------------------------------------------------- MRI breast 07/04/2018: Known malignancy right UOQ 1.4 cm, upper central portion right breast 0.7 cm, LIQ left breast 0.7 cm Left breast biopsy 07/14/2018: Fibrocystic changes  Current treatment:Cycle 4 dose dense Adriamycin and Cytoxan Chemo toxicities: 1.Severe nausea and vomiting in spite of Phenergan, Zofran and Compazine.  We talked about adding Zyprexa but she is not interested in it at this time.  She will try to proactively treat the nausea for few more days after chemo. 2.Mouth sores:  Improved 3.Severe fatigue: She does not appear to get improvement from this fatigue for the entire 2 weeks. 4.  Heartburn is better Return to clinic in 2 weeks for cycle 1 Taxol Carboplatin    No orders of the defined types were placed in this encounter.  The patient has a good understanding of the overall plan. she agrees with it. she will call with any problems that may develop before the next visit here.  Nicholas Lose, MD 08/20/2018  Julious Oka Dorshimer am acting as scribe for Dr. Nicholas Lose.  I have reviewed the above documentation for accuracy and completeness, and I agree with the above.

## 2018-08-20 ENCOUNTER — Inpatient Hospital Stay: Payer: BLUE CROSS/BLUE SHIELD

## 2018-08-20 ENCOUNTER — Encounter: Payer: Self-pay | Admitting: Genetics

## 2018-08-20 ENCOUNTER — Ambulatory Visit: Payer: Self-pay | Admitting: Genetics

## 2018-08-20 ENCOUNTER — Encounter: Payer: Self-pay | Admitting: *Deleted

## 2018-08-20 ENCOUNTER — Inpatient Hospital Stay (HOSPITAL_BASED_OUTPATIENT_CLINIC_OR_DEPARTMENT_OTHER): Payer: BLUE CROSS/BLUE SHIELD | Admitting: Hematology and Oncology

## 2018-08-20 ENCOUNTER — Telehealth: Payer: Self-pay | Admitting: Hematology and Oncology

## 2018-08-20 ENCOUNTER — Inpatient Hospital Stay: Payer: BLUE CROSS/BLUE SHIELD | Admitting: Nutrition

## 2018-08-20 DIAGNOSIS — Z800 Family history of malignant neoplasm of digestive organs: Secondary | ICD-10-CM

## 2018-08-20 DIAGNOSIS — E860 Dehydration: Secondary | ICD-10-CM

## 2018-08-20 DIAGNOSIS — Z79899 Other long term (current) drug therapy: Secondary | ICD-10-CM

## 2018-08-20 DIAGNOSIS — R112 Nausea with vomiting, unspecified: Secondary | ICD-10-CM | POA: Diagnosis not present

## 2018-08-20 DIAGNOSIS — Z171 Estrogen receptor negative status [ER-]: Secondary | ICD-10-CM

## 2018-08-20 DIAGNOSIS — C50411 Malignant neoplasm of upper-outer quadrant of right female breast: Secondary | ICD-10-CM

## 2018-08-20 DIAGNOSIS — Z1379 Encounter for other screening for genetic and chromosomal anomalies: Secondary | ICD-10-CM

## 2018-08-20 DIAGNOSIS — Q998 Other specified chromosome abnormalities: Secondary | ICD-10-CM

## 2018-08-20 DIAGNOSIS — R110 Nausea: Secondary | ICD-10-CM

## 2018-08-20 DIAGNOSIS — Z95828 Presence of other vascular implants and grafts: Secondary | ICD-10-CM

## 2018-08-20 DIAGNOSIS — Z1589 Genetic susceptibility to other disease: Secondary | ICD-10-CM

## 2018-08-20 DIAGNOSIS — Z803 Family history of malignant neoplasm of breast: Secondary | ICD-10-CM

## 2018-08-20 DIAGNOSIS — Z8052 Family history of malignant neoplasm of bladder: Secondary | ICD-10-CM

## 2018-08-20 HISTORY — DX: Other specified chromosome abnormalities: Q99.8

## 2018-08-20 HISTORY — DX: Encounter for other screening for genetic and chromosomal anomalies: Z13.79

## 2018-08-20 LAB — CMP (CANCER CENTER ONLY)
ALT: 11 U/L (ref 0–44)
AST: 16 U/L (ref 15–41)
Albumin: 3.9 g/dL (ref 3.5–5.0)
Alkaline Phosphatase: 74 U/L (ref 38–126)
Anion gap: 10 (ref 5–15)
BUN: 11 mg/dL (ref 6–20)
CO2: 25 mmol/L (ref 22–32)
Calcium: 8.9 mg/dL (ref 8.9–10.3)
Chloride: 105 mmol/L (ref 98–111)
Creatinine: 0.73 mg/dL (ref 0.44–1.00)
GFR, Est AFR Am: >60 mL/min (ref >60)
GFR, Estimated: >60 mL/min (ref >60)
Glucose, Bld: 80 mg/dL (ref 70–99)
Potassium: 4.2 mmol/L (ref 3.5–5.1)
Sodium: 140 mmol/L (ref 135–145)
Total Bilirubin: 0.3 mg/dL (ref 0.3–1.2)
Total Protein: 6.7 g/dL (ref 6.5–8.1)

## 2018-08-20 LAB — CBC WITH DIFFERENTIAL (CANCER CENTER ONLY)
Abs Immature Granulocytes: 2.17 10*3/uL — ABNORMAL HIGH (ref 0.00–0.07)
BASOS PCT: 1 %
Basophils Absolute: 0.2 10*3/uL — ABNORMAL HIGH (ref 0.0–0.1)
Eosinophils Absolute: 0.2 10*3/uL (ref 0.0–0.5)
Eosinophils Relative: 1 %
HCT: 36.5 % (ref 36.0–46.0)
Hemoglobin: 12.5 g/dL (ref 12.0–15.0)
Immature Granulocytes: 16 %
Lymphocytes Relative: 16 %
Lymphs Abs: 2.2 10*3/uL (ref 0.7–4.0)
MCH: 32 pg (ref 26.0–34.0)
MCHC: 34.2 g/dL (ref 30.0–36.0)
MCV: 93.4 fL (ref 80.0–100.0)
Monocytes Absolute: 1 10*3/uL (ref 0.1–1.0)
Monocytes Relative: 7 %
Neutro Abs: 8 10*3/uL — ABNORMAL HIGH (ref 1.7–7.7)
Neutrophils Relative %: 59 %
Platelet Count: 179 10*3/uL (ref 150–400)
RBC: 3.91 MIL/uL (ref 3.87–5.11)
RDW: 15.2 % (ref 11.5–15.5)
WBC Count: 13.6 10*3/uL — ABNORMAL HIGH (ref 4.0–10.5)
nRBC: 0.2 % (ref 0.0–0.2)

## 2018-08-20 MED ORDER — PALONOSETRON HCL INJECTION 0.25 MG/5ML
0.2500 mg | Freq: Once | INTRAVENOUS | Status: AC
  Administered 2018-08-20: 0.25 mg via INTRAVENOUS

## 2018-08-20 MED ORDER — SODIUM CHLORIDE 0.9% FLUSH
10.0000 mL | INTRAVENOUS | Status: DC | PRN
  Administered 2018-08-20: 10 mL
  Filled 2018-08-20: qty 10

## 2018-08-20 MED ORDER — PALONOSETRON HCL INJECTION 0.25 MG/5ML
INTRAVENOUS | Status: AC
  Filled 2018-08-20: qty 5

## 2018-08-20 MED ORDER — HEPARIN SOD (PORK) LOCK FLUSH 100 UNIT/ML IV SOLN
500.0000 [IU] | Freq: Once | INTRAVENOUS | Status: AC | PRN
  Administered 2018-08-20: 500 [IU]
  Filled 2018-08-20: qty 5

## 2018-08-20 MED ORDER — GOSERELIN ACETATE 3.6 MG ~~LOC~~ IMPL
DRUG_IMPLANT | SUBCUTANEOUS | Status: AC
  Filled 2018-08-20: qty 3.6

## 2018-08-20 MED ORDER — SODIUM CHLORIDE 0.9 % IV SOLN
Freq: Once | INTRAVENOUS | Status: AC
  Administered 2018-08-20: 10:00:00 via INTRAVENOUS
  Filled 2018-08-20: qty 5

## 2018-08-20 MED ORDER — SODIUM CHLORIDE 0.9 % IV SOLN
500.0000 mg/m2 | Freq: Once | INTRAVENOUS | Status: AC
  Administered 2018-08-20: 1040 mg via INTRAVENOUS
  Filled 2018-08-20: qty 52

## 2018-08-20 MED ORDER — PROMETHAZINE HCL 25 MG/ML IJ SOLN
25.0000 mg | Freq: Four times a day (QID) | INTRAMUSCULAR | Status: DC | PRN
  Administered 2018-08-20: 25 mg via INTRAVENOUS

## 2018-08-20 MED ORDER — SODIUM CHLORIDE 0.9 % IV SOLN
Freq: Once | INTRAVENOUS | Status: AC
  Administered 2018-08-20: 10:00:00 via INTRAVENOUS
  Filled 2018-08-20: qty 250

## 2018-08-20 MED ORDER — GOSERELIN ACETATE 3.6 MG ~~LOC~~ IMPL
3.6000 mg | DRUG_IMPLANT | Freq: Once | SUBCUTANEOUS | Status: AC
  Administered 2018-08-20: 3.6 mg via SUBCUTANEOUS

## 2018-08-20 MED ORDER — PROMETHAZINE HCL 25 MG/ML IJ SOLN
INTRAMUSCULAR | Status: AC
  Filled 2018-08-20: qty 1

## 2018-08-20 MED ORDER — DOXORUBICIN HCL CHEMO IV INJECTION 2 MG/ML
50.0000 mg/m2 | Freq: Once | INTRAVENOUS | Status: AC
  Administered 2018-08-20: 104 mg via INTRAVENOUS
  Filled 2018-08-20: qty 52

## 2018-08-20 NOTE — Assessment & Plan Note (Signed)
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%, T1c N0 stage Ib  Recommendation: 1.Neo-Adjuvant chemotherapy with dose dense Adriamycin and Cytoxan x4 followed by Taxol and carboplatinweekly x12 2.Patient prefers bilateral mastectomies plus or minus reconstruction Followed by surveillance --------------------------------------------------------------------------------------------------------------------------------- MRI breast 07/04/2018: Known malignancy right UOQ 1.4 cm, upper central portion right breast 0.7 cm, LIQ left breast 0.7 cm Left breast biopsy 07/14/2018: Fibrocystic changes  Current treatment:Cycle 4 dose dense Adriamycin and Cytoxan Chemo toxicities: 1.Severe nausea and vomiting with cycle 1: Dose reduced for cycle 2 and IV Phenergan has been added.  This has helped her significantly.  However she still felt significant nausea.  If it continues then our option would be to add Zyprexa. 2.Mouth sores: Improved 3.Severe fatigue: Improved 4.  Heartburn is better  Return to clinic in 2 weeks for cycle 1 Taxol Carboplatin

## 2018-08-20 NOTE — Progress Notes (Signed)
HPI:  Holly Wiley was previously seen in the [place] clinic on 07/23/2018 due to a personal and family history of cancer and concerns regarding a hereditary predisposition to cancer. Please refer to our prior cancer genetics clinic note for more information regarding Ms. Sundell's medical, social and family histories, and our assessment and recommendations, at the time. Ms. Vidrine recent genetic test results were disclosed to her, as well as recommendations warranted by these results. These results and recommendations are discussed in more detail below.  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  Significant diagnoses are listed below: Family History  Problem Relation Age of Onset  . Breast cancer Mother 75       triple negative, GT neg  . Diabetes Maternal Aunt   . Bladder Cancer Maternal Aunt 69  . Colon cancer Maternal Uncle 3  . Diabetes Maternal Uncle   . Diabetes Maternal Grandmother   . Stroke Paternal Grandfather   . Colon cancer Other 12  . Breast cancer Other        dx >50  . Colon cancer Other        dx>50  . Pancreatic cancer Other     Ms. Vassel has no children.  She  has a 18 year-old sister with no hx of cancer.  She has 4 sons.   Ms. Veach father: 50, no hx of cancer.  Paternal aunts/Uncles: 1 paternal uncle- limited contact/limited info Paternal cousins: no known cancer, .limited info Paternal grandfather: deceased, stroke.  He has 3 nieces who have had cancer- pancreatic, breast, and breast.  Paternal grandmother:deceased, no hx of cancer. Her sister died of pancreatic cancer.   Ms. Theissen mother: 45, hx of triple negative breast cancer.  She had genetic testing that was negative ([facility] cancer panel).  Maternal Aunts/Uncles: 1 maternal aunt had bladder cancer dx at 29 and 1 maternal uncle had colon cancer dx at 40.  Maternal cousins: 1 maternal cousin had uterine cancer and possibly also esophageal cancer.  Maternal grandfather: died of internal bleeding.  He had a sister who had breast cancer and a niece who had breast cancer.  Maternal grandmother:died at 91 due to pneumonia.  She had a sister and a brother who had colon cancer dx >50.   Patient's maternal ancestors are of English/German descent, and paternal ancestors are   GENETIC TEST RESULTS: Genetic testing was performed through [facility] and reported out on 08/05/2018.   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 (c.7105C>T).  The test report will be scanned into EPIC and will be located under the Molecular Pathology section of the Results Review tab. A portion of the result report is included below for reference.     We discussed with Ms. Voorheis that because current genetic testing is not perfect, it is possible there may be a gene mutation in one of these genes that current testing cannot detect, but that chance is small.  We also  discussed, that there could be another gene that has not yet been discovered, or that we have not yet tested, that is responsible for the cancer diagnoses in the family. It is also possible there is a hereditary cause for the cancer in the family that Ms. Olivier did not inherit and therefore was not identified in her testing.  Therefore, it is important to remain in touch with cancer genetics in the future so that we can continue to offer Ms. Parmar the most up to date genetic testing.   Regarding the VUS in APC: At this time, it is unknown if this variant is associated with increased cancer risk or if this is a normal finding, but most variants such as this get reclassified to being inconsequential. It should not be used to make medical management decisions. With time, we suspect the lab will determine the significance of this variant, if any. If we do learn more about it, we will try to contact Ms. Stotler to discuss it further. However, it is important to stay in touch with [place] periodically and keep the address and phone number up to date.  ADDITIONAL GENETIC TESTING: We discussed with Ms. Keetch that her genetic testing was fairly extensive.  If there are are genes identified to increase cancer risk that can be analyzed in the future, we would be happy to discuss and coordinate this testing at that time.    SPINK1: Hereditary Pancreatitis:  SPINK1 mutations are associated with autosomal recessive hereditary pancreatitis.  It is the chronic pancreatitis/inflammation that increases the pancreatic cancer risk seen in family's with hereditary pancreatitis.   SPINK1 is a trypsin inhibitor that is upregulated by inflammation.  It is a susceptibility gene for chronic pancreatitis that follows acute pancreatitis.  Based on current evidence, this seems to affect individuals when biallelic (2 mutations) and follow a recessive pattern.  Ms. Cavalieri has only 1 SPINK1 mutation and therefore is a carrier.  While still  being studied, it does not appear there is a clear significant increased risk for pancreatitis for carriers of a SPINK1 mutation.  Current data suggests that a SPINK1 mutation may be modifer rather than causative factor for pancreatitis when additional risk factors are also present. (PMID [id number]).   Based on current evidence, it does not seem at this time the presence of this heterozygous SPINK1 mutation would change her medical management.   LaRusch J, [place] Pancreatitis Overview. 2014 Mar 13. In: Tarri Glenn, Ardinger HH, Pagon RA, et al., editors. GeneReviews [Internet]. [address] ([place]): [employer] [place], [place]; 1993-2020. Available from: https://www.kidd.net/  CANCER SCREENING RECOMMENDATIONS: This negative result means that we were unable to identify a hereditary cause for her personal and family history of cancer at this time.  While reassuring, this result does not rule out a hereditary cause for her  cancer.  It is still possible that there could be genetic mutations that are undetectable by current technology, or genetic mutations  in genes that have not been tested or identified to increase cancer risk.  Therefore, it is recommended she continue to follow the cancer management and screening guidelines provided by her oncology and primary healthcare provider. An individual's cancer risk is not determined by genetic test results alone.  Overall cancer risk assessment includes additional factors such as personal medical history, family history, etc.  These should be used to make a personalized plan for cancer prevention and surveillance.    RECOMMENDATIONS FOR FAMILY MEMBERS:  Relatives in this family might be at some increased risk of developing cancer, over the general population risk, simply due to the family history of cancer.  We recommended women in this family have a yearly mammogram beginning at age 98, or 22 years younger than the earliest  onset of cancer, an annual clinical breast exam, and perform monthly breast self-exams. Women in this family should also have a gynecological exam as recommended by their primary provider. All family members should have a colonoscopy by age 60 (or as directed by their doctors).  All family members should inform their physicians about the family history of cancer so their doctors can make the most appropriate screening recommendations for them.   We did recommend Ms. Cancro's sister discuss the family history of cancer with her doctors.  She may still be considered 'high risk' and could benefit from increased breast screening.  Other relatives may also benefit from increased colorectal cancer screening given the maternal family history of this cancer.   It is also possible there is a hereditary cause for the cancer in Ms. Cerreta's family that she did not inherit and therefore was not identified in her.   Therefore, we recommended maternal relatives also have genetic counseling and testing. Ms. Gajda will let us know if we can be of any assistance in coordinating genetic counseling and/or testing for these family members.   FOLLOW-UP: Lastly, we discussed with Ms. Betters that cancer genetics is a rapidly advancing field and it is possible that new genetic tests will be appropriate for her and/or her family members in the future. We encouraged her to remain in contact with cancer genetics on an annual basis so we can update her personal and family histories and let her know of advances in cancer genetics that may benefit this family.   Our contact number was provided. Ms. Delon questions were answered to her satisfaction, and she knows she is welcome to call us at anytime with additional questions or concerns.   Ferol Luz, MS, [facility] Certified Genetic Counselor .[email or web address]

## 2018-08-20 NOTE — Patient Instructions (Signed)
[facility] Discharge Instructions for Patients Receiving Chemotherapy  Today you received the following chemotherapy agents Adriamycin and Cytoxan  To help prevent nausea and vomiting after your treatment, we encourage you to take your nausea medication as directed.  If you develop nausea and vomiting that is not controlled by your nausea medication, call the clinic.   BELOW ARE SYMPTOMS THAT SHOULD BE REPORTED IMMEDIATELY:  *FEVER GREATER THAN 100.5 F  *CHILLS WITH OR WITHOUT FEVER  NAUSEA AND VOMITING THAT IS NOT CONTROLLED WITH YOUR NAUSEA MEDICATION  *UNUSUAL SHORTNESS OF BREATH  *UNUSUAL BRUISING OR BLEEDING  TENDERNESS IN MOUTH AND THROAT WITH OR WITHOUT PRESENCE OF ULCERS  *URINARY PROBLEMS  *BOWEL PROBLEMS  UNUSUAL RASH Items with * indicate a potential emergency and should be followed up as soon as possible.  Feel free to call the clinic should you have any questions or concerns. The clinic phone number is [phone].  Please show the CHEMO ALERT CARD at check-in to the Emergency Department and triage nurse.   

## 2018-08-20 NOTE — Progress Notes (Signed)
Nutrition follow up completed with patient during infusion for AAA - breast cancer. Weight is stable at 192 lb. Patient reports nausea was improved this past cycle but not resolved.  She has a good understanding of nutrition strategies to help with nausea. Mouth sores improved. She continues to report fatigue.  Nutrition Diagnosis: Food and Nutrition related knowledge deficit improved.  Intervention: Provided support and encouragement for patient to continue increased oral intake and to consume protein supplements as needed.  Monitoring, Evaluation, Goals: Patient will tolerate increased calories and protein for weight maintenance.  Next Visit: Thursday, March 5, during infusion.

## 2018-08-20 NOTE — Telephone Encounter (Signed)
No los °

## 2018-08-22 ENCOUNTER — Inpatient Hospital Stay: Payer: BLUE CROSS/BLUE SHIELD

## 2018-08-22 ENCOUNTER — Other Ambulatory Visit: Payer: Self-pay | Admitting: *Deleted

## 2018-08-22 VITALS — BP 112/73 | HR 89 | Temp 98.7°F | Resp 16

## 2018-08-22 DIAGNOSIS — E860 Dehydration: Secondary | ICD-10-CM

## 2018-08-22 DIAGNOSIS — R112 Nausea with vomiting, unspecified: Secondary | ICD-10-CM

## 2018-08-22 DIAGNOSIS — C50411 Malignant neoplasm of upper-outer quadrant of right female breast: Secondary | ICD-10-CM

## 2018-08-22 DIAGNOSIS — R110 Nausea: Secondary | ICD-10-CM

## 2018-08-22 DIAGNOSIS — Z171 Estrogen receptor negative status [ER-]: Secondary | ICD-10-CM

## 2018-08-22 DIAGNOSIS — Z95828 Presence of other vascular implants and grafts: Secondary | ICD-10-CM

## 2018-08-22 DIAGNOSIS — N6310 Unspecified lump in the right breast, unspecified quadrant: Secondary | ICD-10-CM

## 2018-08-22 MED ORDER — SODIUM CHLORIDE 0.9% FLUSH
10.0000 mL | INTRAVENOUS | Status: DC | PRN
  Administered 2018-08-22: 10 mL
  Filled 2018-08-22: qty 10

## 2018-08-22 MED ORDER — PROMETHAZINE HCL 25 MG/ML IJ SOLN
12.5000 mg | Freq: Four times a day (QID) | INTRAMUSCULAR | Status: DC | PRN
  Administered 2018-08-22: 25 mg via INTRAVENOUS

## 2018-08-22 MED ORDER — SODIUM CHLORIDE 0.9 % IV SOLN
Freq: Once | INTRAVENOUS | Status: AC
  Administered 2018-08-22: 09:00:00 via INTRAVENOUS
  Filled 2018-08-22: qty 250

## 2018-08-22 MED ORDER — PEGFILGRASTIM-CBQV 6 MG/0.6ML ~~LOC~~ SOSY
PREFILLED_SYRINGE | SUBCUTANEOUS | Status: AC
  Filled 2018-08-22: qty 0.6

## 2018-08-22 MED ORDER — PEGFILGRASTIM-CBQV 6 MG/0.6ML ~~LOC~~ SOSY
6.0000 mg | PREFILLED_SYRINGE | Freq: Once | SUBCUTANEOUS | Status: AC
  Administered 2018-08-22: 6 mg via SUBCUTANEOUS

## 2018-08-22 MED ORDER — PROMETHAZINE HCL 25 MG/ML IJ SOLN
INTRAMUSCULAR | Status: AC
  Filled 2018-08-22: qty 1

## 2018-08-22 MED ORDER — SODIUM CHLORIDE 0.9 % IV SOLN
INTRAVENOUS | Status: DC
  Filled 2018-08-22: qty 250

## 2018-08-22 MED ORDER — PROMETHAZINE HCL 25 MG/ML IJ SOLN: 25.0000 mg | Freq: Four times a day (QID) | INTRAMUSCULAR | Status: DC | PRN

## 2018-08-22 MED ORDER — HEPARIN SOD (PORK) LOCK FLUSH 100 UNIT/ML IV SOLN
500.0000 [IU] | Freq: Once | INTRAVENOUS | Status: AC | PRN
  Administered 2018-08-22: 500 [IU]
  Filled 2018-08-22: qty 5

## 2018-08-22 NOTE — Patient Instructions (Signed)
Dehydration, Adult  Dehydration is when there is not enough fluid or water in your body. This happens when you lose more fluids than you take in. Dehydration can range from mild to very bad. It should be treated right away to keep it from getting very bad. Symptoms of mild dehydration may include:  Thirst.  Dry lips.  Slightly dry mouth.  Dry, warm skin.  Dizziness. Symptoms of moderate dehydration may include:  Very dry mouth.  Muscle cramps.  Dark pee (urine). Pee may be the color of tea.  Your body making less pee.  Your eyes making fewer tears.  Heartbeat that is uneven or faster than normal (palpitations).  Headache.  Light-headedness, especially when you stand up from sitting.  Fainting (syncope). Symptoms of very bad dehydration may include:  Changes in skin, such as: ? Cold and clammy skin. ? Blotchy (mottled) or pale skin. ? Skin that does not quickly return to normal after being lightly pinched and let go (poor skin turgor).  Changes in body fluids, such as: ? Feeling very thirsty. ? Your eyes making fewer tears. ? Not sweating when body temperature is high, such as in hot weather. ? Your body making very little pee.  Changes in vital signs, such as: ? Weak pulse. ? Pulse that is more than 100 beats a minute when you are sitting still. ? Fast breathing. ? Low blood pressure.  Other changes, such as: ? Sunken eyes. ? Cold hands and feet. ? Confusion. ? Lack of energy (lethargy). ? Trouble waking up from sleep. ? Short-term weight loss. ? Unconsciousness. Follow these instructions at home:   If told by your doctor, drink an ORS: ? Make an ORS by using instructions on the package. ? Start by drinking small amounts, about  cup (120 mL) every 5-10 minutes. ? Slowly drink more until you have had the amount that your doctor said to have.  Drink enough clear fluid to keep your pee clear or pale yellow. If you were told to drink an ORS, finish the  ORS first, then start slowly drinking clear fluids. Drink fluids such as: ? Water. Do not drink only water by itself. Doing that can make the salt (sodium) level in your body get too low (hyponatremia). ? Ice chips. ? Fruit juice that you have added water to (diluted). ? Low-calorie sports drinks.  Avoid: ? Alcohol. ? Drinks that have a lot of sugar. These include high-calorie sports drinks, fruit juice that does not have water added, and soda. ? Caffeine. ? Foods that are greasy or have a lot of fat or sugar.  Take over-the-counter and prescription medicines only as told by your doctor.  Do not take salt tablets. Doing that can make the salt level in your body get too high (hypernatremia).  Eat foods that have minerals (electrolytes). Examples include bananas, oranges, potatoes, tomatoes, and spinach.  Keep all follow-up visits as told by your doctor. This is important. Contact a doctor if:  You have belly (abdominal) pain that: ? Gets worse. ? Stays in one area (localizes).  You have a rash.  You have a stiff neck.  You get angry or annoyed more easily than normal (irritability).  You are more sleepy than normal.  You have a harder time waking up than normal.  You feel: ? Weak. ? Dizzy. ? Very thirsty.  You have peed (urinated) only a small amount of very dark pee during 6-8 hours. Get help right away if:  You have   symptoms of very bad dehydration.  You cannot drink fluids without throwing up (vomiting).  Your symptoms get worse with treatment.  You have a fever.  You have a very bad headache.  You are throwing up or having watery poop (diarrhea) and it: ? Gets worse. ? Does not go away.  You have blood or something green (bile) in your throw-up.  You have blood in your poop (stool). This may cause poop to look black and tarry.  You have not peed in 6-8 hours.  You pass out (faint).  Your heart rate when you are sitting still is more than 100 beats a  minute.  You have trouble breathing. This information is not intended to replace advice given to you by your health care provider. Make sure you discuss any questions you have with your health care provider. Document Released: 04/13/2009 Document Revised: 01/05/2016 Document Reviewed: 08/11/2015 Elsevier Interactive Patient Education  2019 Elsevier Inc. Nausea, Adult Nausea is feeling sick to your stomach or feeling that you are about to throw up (vomit). Feeling sick to your stomach is usually not serious, but it may be an early sign of a more serious medical problem. As you feel sicker to your stomach, you may throw up. If you throw up, or if you are not able to drink enough fluids, there is a risk that you may lose too much water in your body (get dehydrated). If you lose too much water in your body, you may:  Feel tired.  Feel thirsty.  Have a dry mouth.  Have cracked lips.  Go pee (urinate) less often. Older adults and people who have other diseases or a weak body defense system (immune system) have a higher risk of losing too much water in the body. The main goals of treating this condition are:  To relieve your nausea.  To ensure your nausea occurs less often.  To prevent throwing up and losing too much fluid. Follow these instructions at home: Watch your symptoms for any changes. Tell your doctor about them. Follow these instructions as told by your doctor. Eating and drinking      Take an ORS (oral rehydration solution). This is a drink that is sold at pharmacies and stores.  Drink clear fluids in small amounts as you are able. These include: ? Water. ? Ice chips. ? Fruit juice that has water added (diluted fruit juice). ? Low-calorie sports drinks.  Eat bland, easy-to-digest foods in small amounts as you are able, such as: ? Bananas. ? Applesauce. ? Rice. ? Low-fat (lean) meats. ? Toast. ? Crackers.  Avoid drinking fluids that have a lot of sugar or  caffeine in them. This includes energy drinks, sports drinks, and soda.  Avoid alcohol.  Avoid spicy or fatty foods. General instructions  Take over-the-counter and prescription medicines only as told by your doctor.  Rest at home while you get better.  Drink enough fluid to keep your pee (urine) pale yellow.  Take slow and deep breaths when you feel sick to your stomach.  Avoid food or things that have strong smells.  Wash your hands often with soap and water. If you cannot use soap and water, use hand sanitizer.  Make sure that all people in your home wash their hands well and often.  Keep all follow-up visits as told by your doctor. This is important. Contact a doctor if:  You feel sicker to your stomach.  You feel sick to your stomach for more than 2   days.  You throw up.  You are not able to drink fluids without throwing up.  You have new symptoms.  You have a fever.  You have a headache.  You have muscle cramps.  You have a rash.  You have pain while peeing.  You feel light-headed or dizzy. Get help right away if:  You have pain in your chest, neck, arm, or jaw.  You feel very weak or you pass out (faint).  You have throw up that is bright red or looks like coffee grounds.  You have bloody or black poop (stools) or poop that looks like tar.  You have a very bad headache, a stiff neck, or both.  You have very bad pain, cramping, or bloating in your belly (abdomen).  You have trouble breathing or you are breathing very quickly.  Your heart is beating very quickly.  Your skin feels cold and clammy.  You feel confused.  You have signs of losing too much water in your body, such as: ? Dark pee, very little pee, or no pee. ? Cracked lips. ? Dry mouth. ? Sunken eyes. ? Sleepiness. ? Weakness. These symptoms may be an emergency. Do not wait to see if the symptoms will go away. Get medical help right away. Call your local emergency services (911  in the [place] Do not drive yourself to the hospital. Summary  Nausea is feeling sick to your stomach or feeling that you are about to throw up (vomit).  If you throw up, or if you are not able to drink enough fluids, there is a risk that you may lose too much water in your body (get dehydrated).  Eat and drink what your doctor tells you. Take over-the-counter and prescription medicines only as told by your doctor.  Contact a doctor right away if your symptoms get worse or you have new symptoms.  Keep all follow-up visits as told by your doctor. This is important. This information is not intended to replace advice given to you by your health care provider. Make sure you discuss any questions you have with your health care provider. Document Released: 06/06/2011 Document Revised: 11/25/2017 Document Reviewed: 11/25/2017 Elsevier Interactive Patient Education  2019 Elsevier Inc.  

## 2018-08-24 ENCOUNTER — Telehealth: Payer: Self-pay | Admitting: *Deleted

## 2018-08-24 ENCOUNTER — Other Ambulatory Visit: Payer: Self-pay | Admitting: Hematology and Oncology

## 2018-08-24 DIAGNOSIS — C50411 Malignant neoplasm of upper-outer quadrant of right female breast: Secondary | ICD-10-CM

## 2018-08-24 DIAGNOSIS — Z171 Estrogen receptor negative status [ER-]: Principal | ICD-10-CM

## 2018-08-24 NOTE — Telephone Encounter (Signed)
Received VM message form patient -describes ongoing nausea/vomiting since her Adriamycin/Cytoxin las t Thursday.  Tct patient. Spoke with her. She has been experiencing ongoing nausea/vomiting since her chemo last Thursday. She has taken her anti emetics-Zofran, compazine, phenergan and lorazepam without much relief.  Reviewed her med, making sure she alternates them to take something every 3-4 hours along with some saltines etc.  She is drinking fluids well-> 3 liters per day. Minimal solid foods. Urine is clear and light in color. She does have some constipation but uses senna s as needed. Denies dizzyness, fever, chills.  She states she does not feel dehydrated. Due to the lateness of the day, encouraged pt to continue meds and then come to [facility] in the morning (with labs prior) Pt is agreeable to this and states she thinks she will be ok overnight.  She also stated that if she is better she will call  Here.

## 2018-08-25 ENCOUNTER — Inpatient Hospital Stay: Payer: BLUE CROSS/BLUE SHIELD

## 2018-08-25 ENCOUNTER — Inpatient Hospital Stay (HOSPITAL_BASED_OUTPATIENT_CLINIC_OR_DEPARTMENT_OTHER): Payer: BLUE CROSS/BLUE SHIELD | Admitting: Medical

## 2018-08-25 VITALS — BP 105/67 | HR 94 | Temp 98.9°F | Resp 18 | Ht 67.0 in | Wt 189.9 lb

## 2018-08-25 DIAGNOSIS — R112 Nausea with vomiting, unspecified: Secondary | ICD-10-CM

## 2018-08-25 DIAGNOSIS — C50411 Malignant neoplasm of upper-outer quadrant of right female breast: Secondary | ICD-10-CM

## 2018-08-25 DIAGNOSIS — Z171 Estrogen receptor negative status [ER-]: Secondary | ICD-10-CM | POA: Diagnosis not present

## 2018-08-25 DIAGNOSIS — E860 Dehydration: Secondary | ICD-10-CM

## 2018-08-25 DIAGNOSIS — Z79899 Other long term (current) drug therapy: Secondary | ICD-10-CM

## 2018-08-25 LAB — CBC WITH DIFFERENTIAL (CANCER CENTER ONLY)
Abs Immature Granulocytes: 0 10*3/uL (ref 0.00–0.07)
Band Neutrophils: 2 %
Basophils Absolute: 0 10*3/uL (ref 0.0–0.1)
Basophils Relative: 0 %
EOS ABS: 0.3 10*3/uL (ref 0.0–0.5)
Eosinophils Relative: 1 %
HCT: 34.4 % — ABNORMAL LOW (ref 36.0–46.0)
Hemoglobin: 11.8 g/dL — ABNORMAL LOW (ref 12.0–15.0)
Lymphocytes Relative: 4 %
Lymphs Abs: 1 10*3/uL (ref 0.7–4.0)
MCH: 32.2 pg (ref 26.0–34.0)
MCHC: 34.3 g/dL (ref 30.0–36.0)
MCV: 94 fL (ref 80.0–100.0)
Monocytes Absolute: 0 10*3/uL — ABNORMAL LOW (ref 0.1–1.0)
Monocytes Relative: 0 %
NRBC: 0 % (ref 0.0–0.2)
Neutro Abs: 24.7 10*3/uL — ABNORMAL HIGH (ref 1.7–17.7)
Neutrophils Relative %: 93 %
PLATELETS: 128 10*3/uL — AB (ref 150–400)
RBC: 3.66 MIL/uL — AB (ref 3.87–5.11)
RDW: 14.8 % (ref 11.5–15.5)
WBC Count: 26 10*3/uL — ABNORMAL HIGH (ref 4.0–10.5)

## 2018-08-25 LAB — CMP (CANCER CENTER ONLY)
ALT: 10 U/L (ref 0–44)
AST: 12 U/L — ABNORMAL LOW (ref 15–41)
Albumin: 3.8 g/dL (ref 3.5–5.0)
Alkaline Phosphatase: 107 U/L (ref 38–126)
Anion gap: 7 (ref 5–15)
BUN: 11 mg/dL (ref 6–20)
CO2: 27 mmol/L (ref 22–32)
Calcium: 9 mg/dL (ref 8.9–10.3)
Chloride: 104 mmol/L (ref 98–111)
Creatinine: 0.63 mg/dL (ref 0.44–1.00)
GFR, Est AFR Am: >60 mL/min (ref >60)
GFR, Estimated: >60 mL/min (ref >60)
Glucose, Bld: 88 mg/dL (ref 70–99)
Potassium: 3.7 mmol/L (ref 3.5–5.1)
SODIUM: 138 mmol/L (ref 135–145)
Total Bilirubin: 0.8 mg/dL (ref 0.3–1.2)
Total Protein: 6.6 g/dL (ref 6.5–8.1)

## 2018-08-25 MED ORDER — HEPARIN SOD (PORK) LOCK FLUSH 100 UNIT/ML IV SOLN
500.0000 [IU] | Freq: Once | INTRAVENOUS | Status: AC
  Administered 2018-08-25: 500 [IU] via INTRAVENOUS
  Filled 2018-08-25: qty 5

## 2018-08-25 MED ORDER — ONDANSETRON HCL 4 MG/2ML IJ SOLN
4.0000 mg | Freq: Once | INTRAMUSCULAR | Status: AC
  Administered 2018-08-25: 4 mg via INTRAVENOUS

## 2018-08-25 MED ORDER — SODIUM CHLORIDE 0.9 % IV SOLN: 10.0000 mg | Freq: Once | INTRAVENOUS | Status: DC

## 2018-08-25 MED ORDER — SODIUM CHLORIDE 0.9% FLUSH
10.0000 mL | INTRAVENOUS | Status: DC | PRN
  Administered 2018-08-25: 10 mL via INTRAVENOUS
  Filled 2018-08-25: qty 10

## 2018-08-25 MED ORDER — DEXAMETHASONE SODIUM PHOSPHATE 10 MG/ML IJ SOLN
INTRAMUSCULAR | Status: AC
  Filled 2018-08-25: qty 1

## 2018-08-25 MED ORDER — SODIUM CHLORIDE 0.9 % IV SOLN
Freq: Once | INTRAVENOUS | Status: AC
  Administered 2018-08-25: 10:00:00 via INTRAVENOUS
  Filled 2018-08-25: qty 250

## 2018-08-25 MED ORDER — ONDANSETRON HCL 4 MG/2ML IJ SOLN
INTRAMUSCULAR | Status: AC
  Filled 2018-08-25: qty 2

## 2018-08-25 MED ORDER — DEXAMETHASONE SODIUM PHOSPHATE 10 MG/ML IJ SOLN
10.0000 mg | Freq: Once | INTRAMUSCULAR | Status: AC
  Administered 2018-08-25: 10 mg via INTRAVENOUS

## 2018-08-25 NOTE — Patient Instructions (Signed)
Dehydration, Adult  Dehydration is when there is not enough fluid or water in your body. This happens when you lose more fluids than you take in. Dehydration can range from mild to very bad. It should be treated right away to keep it from getting very bad. Symptoms of mild dehydration may include:  Thirst.  Dry lips.  Slightly dry mouth.  Dry, warm skin.  Dizziness. Symptoms of moderate dehydration may include:  Very dry mouth.  Muscle cramps.  Dark pee (urine). Pee may be the color of tea.  Your body making less pee.  Your eyes making fewer tears.  Heartbeat that is uneven or faster than normal (palpitations).  Headache.  Light-headedness, especially when you stand up from sitting.  Fainting (syncope). Symptoms of very bad dehydration may include:  Changes in skin, such as: ? Cold and clammy skin. ? Blotchy (mottled) or pale skin. ? Skin that does not quickly return to normal after being lightly pinched and let go (poor skin turgor).  Changes in body fluids, such as: ? Feeling very thirsty. ? Your eyes making fewer tears. ? Not sweating when body temperature is high, such as in hot weather. ? Your body making very little pee.  Changes in vital signs, such as: ? Weak pulse. ? Pulse that is more than 100 beats a minute when you are sitting still. ? Fast breathing. ? Low blood pressure.  Other changes, such as: ? Sunken eyes. ? Cold hands and feet. ? Confusion. ? Lack of energy (lethargy). ? Trouble waking up from sleep. ? Short-term weight loss. ? Unconsciousness. Follow these instructions at home:   If told by your doctor, drink an ORS: ? Make an ORS by using instructions on the package. ? Start by drinking small amounts, about  cup (120 mL) every 5-10 minutes. ? Slowly drink more until you have had the amount that your doctor said to have.  Drink enough clear fluid to keep your pee clear or pale yellow. If you were told to drink an ORS, finish the  ORS first, then start slowly drinking clear fluids. Drink fluids such as: ? Water. Do not drink only water by itself. Doing that can make the salt (sodium) level in your body get too low (hyponatremia). ? Ice chips. ? Fruit juice that you have added water to (diluted). ? Low-calorie sports drinks.  Avoid: ? Alcohol. ? Drinks that have a lot of sugar. These include high-calorie sports drinks, fruit juice that does not have water added, and soda. ? Caffeine. ? Foods that are greasy or have a lot of fat or sugar.  Take over-the-counter and prescription medicines only as told by your doctor.  Do not take salt tablets. Doing that can make the salt level in your body get too high (hypernatremia).  Eat foods that have minerals (electrolytes). Examples include bananas, oranges, potatoes, tomatoes, and spinach.  Keep all follow-up visits as told by your doctor. This is important. Contact a doctor if:  You have belly (abdominal) pain that: ? Gets worse. ? Stays in one area (localizes).  You have a rash.  You have a stiff neck.  You get angry or annoyed more easily than normal (irritability).  You are more sleepy than normal.  You have a harder time waking up than normal.  You feel: ? Weak. ? Dizzy. ? Very thirsty.  You have peed (urinated) only a small amount of very dark pee during 6-8 hours. Get help right away if:  You have   symptoms of very bad dehydration.  You cannot drink fluids without throwing up (vomiting).  Your symptoms get worse with treatment.  You have a fever.  You have a very bad headache.  You are throwing up or having watery poop (diarrhea) and it: ? Gets worse. ? Does not go away.  You have blood or something green (bile) in your throw-up.  You have blood in your poop (stool). This may cause poop to look black and tarry.  You have not peed in 6-8 hours.  You pass out (faint).  Your heart rate when you are sitting still is more than 100 beats a  minute.  You have trouble breathing. This information is not intended to replace advice given to you by your health care provider. Make sure you discuss any questions you have with your health care provider. Document Released: 04/13/2009 Document Revised: 01/05/2016 Document Reviewed: 08/11/2015 Elsevier Interactive Patient Education  2019 [employer]vier Inc.  

## 2018-08-27 NOTE — Progress Notes (Signed)
[facility] Progress Note   Holly Wiley [id number] [birth year] 43 y.o.  Holly Wiley is managed by Dr. Nicholas Lose  Actively treated with chemotherapy/immunotherapy/hormonal therapy: yes  Current therapy: Adriamycin and Cytoxan with Udenyca support  Last treated: 08/20/2018 (cycle 4, day 1)  Next scheduled appointment with provider: 09/03/2018  Assessment: Plan:    Dehydration - Plan: 0.9 %  sodium chloride infusion, dexamethasone (DECADRON) injection 10 mg, sodium chloride flush (NS) 0.9 % injection 10 mL, heparin lock flush 100 unit/mL  Non-intractable vomiting with nausea, unspecified vomiting type - Plan: ondansetron (ZOFRAN) injection 4 mg, dexamethasone (DECADRON) injection 10 mg, DISCONTINUED: dexamethasone (DECADRON) 10 mg in sodium chloride 0.9 % 50 mL IVPB  Malignant neoplasm of upper-outer quadrant of right breast in female, estrogen receptor negative (HCC)   Dehydration with nausea and vomiting: The patient was given 1 L of normal saline, Zofran 4 mg IV, and Decadron 10 mg IV.  ER negative malignant neoplasm of the right breast: The patient is status post cycle 4, day 1 of Adriamycin and Cytoxan with Udenyca support.  She is scheduled to follow-up with Dr. Lindi Adie on 09/03/2018.  Please see After Visit Summary for patient specific instructions.  Future Appointments  Date Time Provider [place]  09/03/2018  8:30 AM [facility] LAB 3 [facility] None  09/03/2018  9:00 AM [facility] [place] FLUSH [facility] None  09/03/2018  9:30 AM Nicholas Lose, MD [facility] None  09/03/2018 10:00 AM [facility] INFUSION [facility] None  09/03/2018 12:00 PM Neff, Barbara L, RD [facility] None  09/10/2018 12:15 PM [facility] INFUSION [facility] None  09/17/2018  1:00 PM [facility] INFUSION [facility] None  09/24/2018  8:15 AM [facility] LAB 4 [facility] None  09/24/2018  8:45 AM [facility] [place] FLUSH [facility] None  09/24/2018  9:30 AM Nicholas Lose, MD  [facility] None  09/24/2018 10:45 AM [facility] INFUSION [facility] None  10/01/2018  1:00 PM [facility] INFUSION [facility] None  10/08/2018 11:30 AM Causey, Charlestine Massed, NP [facility] None  10/08/2018 12:30 PM [facility] INFUSION [facility] None  10/15/2018  8:15 AM [facility] LAB 3 [facility] None  10/15/2018  8:30 AM [facility] [place] FLUSH [facility] None  10/15/2018  9:00 AM Nicholas Lose, MD [facility] None  10/15/2018 10:00 AM [facility] INFUSION [facility] None  10/22/2018  1:00 PM [facility] INFUSION [facility] None  10/29/2018 10:15 AM [facility] LAB 3 [facility] None  10/29/2018 10:30 AM [facility] [place] FLUSH [facility] None  10/29/2018 11:00 AM Nicholas Lose, MD [facility] None  10/29/2018 12:00 PM [facility] INFUSION [facility] None  11/05/2018  1:00 PM [facility] INFUSION [facility] None    No orders of the defined types were placed in this encounter.      Subjective:   Patient ID:  Holly Wiley is a 43 y.o. (DOB [birth year]) female.  Chief Complaint: No chief complaint on file.   HPI Holly Wiley is a 43 year old female with a diagnosis of an ER negative malignant neoplasm of the right breast.  She is status post cycle 4, day 1 of Adriamycin and Cytoxan with Udenyca support.  She presents to the office today with nausea, vomiting, orthostasis, and dehydration.  She denies fevers, chills, sweats, constipation, or diarrhea.  Medications: I have reviewed the patient's current medications.  Allergies:  Allergies  Allergen Reactions  . Bee Venom Anaphylaxis  . Dihydroergotamine Anaphylaxis  . Imitrex [Sumatriptan] Other (See Comments)    SEIZURE-LIKE ACTIVITY  . Metoclopramide Other (See Comments)    TACHYCARDIA  . Transderm-Scop [Scopolamine] Other (See Comments)  AGITATION    Past Medical History:  Diagnosis Date  . Breast cancer ([place]) 07/2018   right  . Dental crown present   . Family history of bladder cancer   . Family history of breast  cancer   . Family history of colon cancer   . Family history of pancreatic cancer   . 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  . Migraines   . PONV (postoperative nausea and vomiting)     Past Surgical History:  Procedure Laterality Date  . BREAST BIOPSY Right 11/08/2013   Procedure: EXCISION OF RIGHT  BREAST MASS;  Surgeon: Adin Hector, MD;  Location: [facility];  Service: General;  Laterality: Right;  . BUNIONECTOMY Left   . CARPAL TUNNEL RELEASE Right 02/26/2017  . CERVICAL CONE BIOPSY  05/2005  . COLONOSCOPY  2014  . PORTACATH PLACEMENT Right 07/08/2018   Procedure: INSERTION PORT-A-CATH;  Surgeon: Fanny Skates, MD;  Location: [facility];  Service: General;  Laterality: Right;  . SHOULDER ARTHROSCOPY W/ LABRAL REPAIR Right   . SHOULDER ARTHROSCOPY W/ ROTATOR CUFF REPAIR Left   . TENOTOMY FOREARM / WRIST Right 02/26/2017    Family History  Problem Relation Age of Onset  . Breast cancer Mother 79       triple negative, GT neg  . Diabetes Maternal Aunt   . Bladder Cancer Maternal Aunt 69  . Colon cancer Maternal Uncle 68  . Diabetes Maternal Uncle   . Diabetes Maternal Grandmother   . Stroke Paternal Grandfather   . Colon cancer Other 49  . Breast cancer Other        dx >50  . Colon cancer Other        dx>50  . Pancreatic cancer Other     Social History   Socioeconomic History  . Marital status: Divorced    Spouse name: Not on file  . Number of children: Not on file  . Years of education: Not on file  . Highest education level: Not on file  Occupational History  . Not on file  Social Needs  . Financial resource strain: Not on file  . Food insecurity:    Worry: Not on file    Inability: Not on file  . Transportation needs:    Medical: Not on file    Non-medical: Not on file  Tobacco Use  . Smoking status: Never Smoker  . Smokeless tobacco: Never Used  Substance and Sexual Activity  .  Alcohol use: Yes    Comment: occasionally  . Drug use: No  . Sexual activity: Not on file  Lifestyle  . Physical activity:    Days per week: Not on file    Minutes per session: Not on file  . Stress: Not on file  Relationships  . Social connections:    Talks on phone: Not on file    Gets together: Not on file    Attends religious service: Not on file    Active member of club or organization: Not on file    Attends meetings of clubs or organizations: Not on file    Relationship status: Not on file  . Intimate partner violence:    Fear of current or ex partner: Not on file    Emotionally abused: Not on file    Physically abused: Not on file    Forced sexual activity: Not on file  Other Topics Concern  . Not on file  Social  History Narrative  . Not on file    Past Medical History, Surgical history, Social history, and Family history were reviewed and updated as appropriate.   Please see review of systems for further details on the patient's review from today.   Review of Systems:  Review of Systems  Constitutional: Positive for appetite change. Negative for chills, diaphoresis and fever.  HENT: Negative for trouble swallowing.   Respiratory: Negative for cough, choking, shortness of breath and wheezing.   Cardiovascular: Negative for chest pain and palpitations.  Gastrointestinal: Positive for nausea and vomiting. Negative for constipation and diarrhea.  Genitourinary: Negative for decreased urine volume.  Neurological: Positive for dizziness. Negative for headaches.    Objective:   Physical Exam:  BP 105/67   Pulse 94   Temp 98.9 F (37.2 C) (Oral)   Resp 18   Ht 5\' 7"  (1.702 m)   Wt 189 lb 14.4 oz (86.1 kg)   SpO2 99%   BMI 29.74 kg/m  ECOG: 1  Orthostatic Blood Pressure: Blood pressure:   sitting 100/69, standing 85/64 Pulse:   sitting 90, standing 97  Physical Exam Constitutional:      General: She is not in acute distress.    Appearance: She is not  diaphoretic.  HENT:     Head: Normocephalic and atraumatic.     Mouth/Throat:     Mouth: Mucous membranes are dry.  Cardiovascular:     Rate and Rhythm: Normal rate and regular rhythm.     Heart sounds: Normal heart sounds. No murmur. No friction rub. No gallop.   Pulmonary:     Effort: Pulmonary effort is normal. No respiratory distress.     Breath sounds: Normal breath sounds. No stridor. No wheezing or rales.  Abdominal:     General: Bowel sounds are normal. There is no distension.     Palpations: Abdomen is soft. There is no mass.     Tenderness: There is no abdominal tenderness. There is no guarding or rebound.  Skin:    General: Skin is warm and dry.  Neurological:     Mental Status: She is alert.     Coordination: Coordination normal.     Gait: Gait normal.     Lab Review:     Component Value Date/Time   NA 138 08/25/2018 0857   K 3.7 08/25/2018 0857   CL 104 08/25/2018 0857   CO2 27 08/25/2018 0857   GLUCOSE 88 08/25/2018 0857   BUN 11 08/25/2018 0857   CREATININE 0.63 08/25/2018 0857   CALCIUM 9.0 08/25/2018 0857   PROT 6.6 08/25/2018 0857   ALBUMIN 3.8 08/25/2018 0857   AST 12 (L) 08/25/2018 0857   ALT 10 08/25/2018 0857   ALKPHOS 107 08/25/2018 0857   BILITOT 0.8 08/25/2018 0857   GFRNONAA >60 08/25/2018 0857   GFRAA >60 08/25/2018 0857       Component Value Date/Time   WBC 26.0 (H) 08/25/2018 0857   WBC 2.8 (L) 10/01/2005 1330   RBC 3.66 (L) 08/25/2018 0857   HGB 11.8 (L) 08/25/2018 0857   HGB 14.0 10/01/2005 1330   HCT 34.4 (L) 08/25/2018 0857   HCT 39.9 10/01/2005 1330   PLT 128 (L) 08/25/2018 0857   PLT 219 10/01/2005 1330   MCV 94.0 08/25/2018 0857   MCV 92.3 10/01/2005 1330   MCH 32.2 08/25/2018 0857   MCHC 34.3 08/25/2018 0857   RDW 14.8 08/25/2018 0857   RDW 13.0 10/01/2005 1330   LYMPHSABS 1.0 08/25/2018 0857  LYMPHSABS 1.3 10/01/2005 1330   MONOABS 0.0 (L) 08/25/2018 0857   MONOABS 0.3 10/01/2005 1330   EOSABS 0.3 08/25/2018 0857    EOSABS 0.0 10/01/2005 1330   BASOSABS 0.0 08/25/2018 0857   BASOSABS 0.0 10/01/2005 1330   -------------------------------  Imaging from last 24 hours (if applicable):  Radiology interpretation: No results found.

## 2018-08-31 ENCOUNTER — Other Ambulatory Visit: Payer: Self-pay | Admitting: General Surgery

## 2018-08-31 DIAGNOSIS — C50411 Malignant neoplasm of upper-outer quadrant of right female breast: Secondary | ICD-10-CM

## 2018-09-01 ENCOUNTER — Other Ambulatory Visit: Payer: Self-pay | Admitting: Hematology and Oncology

## 2018-09-01 DIAGNOSIS — Z171 Estrogen receptor negative status [ER-]: Principal | ICD-10-CM

## 2018-09-01 DIAGNOSIS — C50411 Malignant neoplasm of upper-outer quadrant of right female breast: Secondary | ICD-10-CM

## 2018-09-01 NOTE — Progress Notes (Signed)
Patient Care Team: [name], NP as PCP - General (Nurse Practitioner)  DIAGNOSIS:    ICD-10-CM   1. Palpitation R00.2 ECHOCARDIOGRAM COMPLETE  2. Malignant neoplasm of upper-outer quadrant of right breast in female, estrogen receptor negative ([place]) C50.411    Z17.1     SUMMARY OF ONCOLOGIC HISTORY:   Malignant neoplasm of upper-outer quadrant of right breast in female, estrogen receptor negative ([place])   06/22/2018 Initial Diagnosis    Screening mammogram detected right breast mass 1.1 cm at 10 o'clock position right breast 12 cm from the nipple, no abnormal lymph nodes, biopsy of the mass revealed IDC with DCIS grade 3, ER 0%, PR 0%, HER-2 -1+ by IHC, Ki-67 40%, T1c N0 stage Ib    06/29/2018 Cancer Staging    Staging form: Breast, AJCC 8th Edition - Clinical stage from 06/29/2018: Stage IB (cT1c, cN0, cM0, G3, ER-, PR-, HER2-) - Signed by Nicholas Lose, MD on 06/29/2018    07/09/2018 -  Neo-Adjuvant Chemotherapy     Neo- Adjuvant chemotherapy with dose dense Adriamycin and Cytoxan x4 followed by Taxol and carboplatin weekly x12     CHIEF COMPLIANT: Cycle 1 of Taxol and Carboplatin  INTERVAL HISTORY: Holly Wiley is a 43 y.o. with above-mentioned history of triple negative right breast cancer.She presents to the clinic todaywith hermom. She reports being nauseous 80% of the time and prefers not to take medication and instead meditate and use a fan. She reports vomiting following her last treatment, in addition to severe fatigue, and elevated resting heart rate she can feel throughout her body that comes and goes at random times and wakes her up. She stopped taking Claritin and took dexamethasone only once with no relief. She has taken Ativan following elevated heart rate with some improvement. She reports her constant, heavy menstrual bleeding stopped within 12 hours following her Zoladex injection. She is attempting to stay hydrated and has been able to eat. Her labs from  today show: WBC 14.0, Hg 11.9, platelets 175, ANC 9.2.   REVIEW OF SYSTEMS:   Constitutional: Denies fevers, chills or abnormal weight loss (+) fatigue Eyes: Denies blurriness of vision Ears, nose, mouth, throat, and face: Denies mucositis or sore throat Respiratory: Denies cough, dyspnea or wheezes Cardiovascular: (+) palpitation Gastrointestinal: Denies heartburn or change in bowel habits (+) nausea (+) vomiting  Skin: Denies abnormal skin rashes Lymphatics: Denies new lymphadenopathy or easy bruising Neurological: Denies numbness, tingling or new weaknesses Behavioral/Psych: Mood is stable, no new changes  Extremities: No lower extremity edema Breast: denies any pain or lumps or nodules in either breasts All other systems were reviewed with the patient and are negative.  I have reviewed the past medical history, past surgical history, social history and family history with the patient and they are unchanged from previous note.  ALLERGIES:  is allergic to bee venom; dihydroergotamine; imitrex [sumatriptan]; metoclopramide; and transderm-scop [scopolamine].  MEDICATIONS:  Current Outpatient Medications  Medication Sig Dispense Refill  . EPINEPHrine (EPIPEN) 0.3 mg/0.3 mL IJ SOAJ injection Inject 0.3 mg into the muscle once.     . lidocaine-prilocaine (EMLA) cream Apply to affected area once 30 g 3  . LORazepam (ATIVAN) 1 MG tablet Take 2 tablets (2 mg total) by mouth at bedtime as needed for anxiety. Take 1 mg tablet every 6-8hrs as needed for nausea/vomiting. 60 tablet 1  . magic mouthwash w/lidocaine SOLN Take 5 mLs by mouth 3 (three) times daily. 100 mL 0  . ondansetron (ZOFRAN) 8  MG tablet Take 1 tablet (8 mg total) by mouth 2 (two) times daily as needed. Start on the third day after chemotherapy. 30 tablet 1  . prochlorperazine (COMPAZINE) 10 MG tablet Take 1 tablet (10 mg total) by mouth every 6 (six) hours as needed (Nausea or vomiting). 30 tablet 1  . prochlorperazine  (COMPAZINE) 25 MG suppository prochlorperazine 25 mg rectal suppository  INSERT 1 SUPPOSITORY RECTALLY TWICE A DAY AS NEEDED FOR NAUSEA    . promethazine (PHENERGAN) 25 MG tablet Take 1 tablet (25 mg total) by mouth every 6 (six) hours as needed for nausea. 30 tablet 3   No current facility-administered medications for this visit.    Facility-Administered Medications Ordered in Other Visits  Medication Dose Route Frequency Provider Last Rate Last Dose  . 0.9 %  sodium chloride infusion   Intravenous Once Nicholas Lose, MD      . CARBOplatin (PARAPLATIN) 700 mg in sodium chloride 0.9 % 250 mL chemo infusion  700 mg Intravenous Once Nicholas Lose, MD      . diphenhydrAMINE (BENADRYL) injection 50 mg  50 mg Intravenous Once Nicholas Lose, MD      . famotidine (PEPCID) 20 mg in sodium chloride 0.9 % 25 mL IVPB  20 mg Intravenous Once Nicholas Lose, MD      . fosaprepitant (EMEND) 150 mg, dexamethasone (DECADRON) 12 mg in sodium chloride 0.9 % 145 mL IVPB   Intravenous Once Nicholas Lose, MD      . heparin lock flush 100 unit/mL  500 Units Intracatheter Once PRN Nicholas Lose, MD      . PACLitaxel (TAXOL) 162 mg in sodium chloride 0.9 % 250 mL chemo infusion (</= 72m/m2)  80 mg/m2 (Treatment Plan Recorded) Intravenous Once GNicholas Lose MD      . palonosetron (ALOXI) injection 0.25 mg  0.25 mg Intravenous Once GNicholas Lose MD      . sodium chloride flush (NS) 0.9 % injection 10 mL  10 mL Intracatheter PRN GNicholas Lose MD        PHYSICAL EXAMINATION: ECOG PERFORMANCE STATUS: 1 - Symptomatic but completely ambulatory  Vitals:   09/03/18 0936  BP: 109/77  Pulse: 92  Resp: 16  Temp: 98.2 F (36.8 C)  SpO2: 98%   Filed Weights   09/03/18 0936  Weight: 193 lb 1.6 oz (87.6 kg)    GENERAL: alert, no distress and comfortable SKIN: skin color, texture, turgor are normal, no rashes or significant lesions EYES: normal, Conjunctiva are pink and non-injected, sclera clear OROPHARYNX: no  exudate, no erythema and lips, buccal mucosa, and tongue normal  NECK: supple, thyroid normal size, non-tender, without nodularity LYMPH: no palpable lymphadenopathy in the cervical, axillary or inguinal LUNGS: clear to auscultation and percussion with normal breathing effort HEART: regular rate & rhythm and no murmurs and no lower extremity edema ABDOMEN: abdomen soft, non-tender and normal bowel sounds MUSCULOSKELETAL: no cyanosis of digits and no clubbing  NEURO: alert & oriented x 3 with fluent speech, no focal motor/sensory deficits EXTREMITIES: No lower extremity edema  LABORATORY DATA:  I have reviewed the data as listed CMP Latest Ref Rng & Units 09/03/2018 08/25/2018 08/20/2018  Glucose 70 - 99 mg/dL 83 88 80  BUN 6 - 20 mg/dL '15 11 11  ' Creatinine 0.44 - 1.00 mg/dL 0.79 0.63 0.73  Sodium 135 - 145 mmol/L 136 138 140  Potassium 3.5 - 5.1 mmol/L 4.1 3.7 4.2  Chloride 98 - 111 mmol/L 103 104 105  CO2 22 -  32 mmol/L '27 27 25  ' Calcium 8.9 - 10.3 mg/dL 9.1 9.0 8.9  Total Protein 6.5 - 8.1 g/dL 6.9 6.6 6.7  Total Bilirubin 0.3 - 1.2 mg/dL 0.4 0.8 0.3  Alkaline Phos 38 - 126 U/L 76 107 74  AST 15 - 41 U/L 16 12(L) 16  ALT 0 - 44 U/L '15 10 11    ' Lab Results  Component Value Date   WBC 14.0 (H) 09/03/2018   HGB 11.9 (L) 09/03/2018   HCT 35.5 (L) 09/03/2018   MCV 95.4 09/03/2018   PLT 175 09/03/2018   NEUTROABS 9.2 (H) 09/03/2018    ASSESSMENT & PLAN:  Malignant neoplasm of upper-outer quadrant of right breast in female, estrogen receptor negative ([place]) 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%, T1c N0 stage Ib  Recommendation: 1.Neo-Adjuvant chemotherapy with dose dense Adriamycin and Cytoxan x4 followed by Taxol and carboplatinweekly x12 2.Patient prefers bilateral  mastectomies plus or minus reconstruction Followed by surveillance --------------------------------------------------------------------------------------------------------------------------------- MRI breast 07/04/2018: Known malignancy right UOQ 1.4 cm, upper central portion right breast 0.7 cm, LIQ left breast 0.7 cm Left breast biopsy 07/14/2018: Fibrocystic changes  Current treatment:Completed 4 cycles of dose dense Adriamycin and Cytoxan, today cycle 1 Taxol with carboplatin Chemo toxicities: 1.Severe nausea and vomiting in spite of Phenergan, Zofran and Compazine.    Required IV fluids 2.Mouth sores: Improved 3.Severe fatigue: She does not appear to get improvement from this fatigue for the entire 2 weeks. 4.Heartburn is better  Heavy bleeding from the uterus: Controlled with Zoladex.  Her insurance is refusing to pay for Zoladex.  We are trying to get that approved and authorized.  If she does not get Zoladex she is likely to require transfusions and/or hospitalizations.  Return to clinic weekly for Taxol treatments.    Orders Placed This Encounter  Procedures  . ECHOCARDIOGRAM COMPLETE    Standing Status:   Future    Standing Expiration Date:   12/04/2019    Order Specific Question:   Where should this test be performed    Answer:   [facility])    Order Specific Question:   Does the patient weigh less than or greater than 250 lbs?    Answer:   Patient weighs less than 250 lbs    Order Specific Question:   Perflutren DEFINITY (image enhancing agent) should be administered unless hypersensitivity or allergy exist    Answer:   Administer Perflutren    Order Specific Question:   Reason for exam-Echo    Answer:   Chemotherapy evaluation  v87.41 / v58.11   The patient has a good understanding of the overall plan. she agrees with it. she will call with any problems that may develop before the next visit here.  Nicholas Lose, MD 09/03/2018  Julious Oka  Dorshimer am acting as scribe for Dr. Nicholas Lose.  I have reviewed the above documentation for accuracy and completeness, and I agree with the above.

## 2018-09-03 ENCOUNTER — Encounter: Payer: Self-pay | Admitting: Adult Health

## 2018-09-03 ENCOUNTER — Encounter: Payer: Self-pay | Admitting: Hematology and Oncology

## 2018-09-03 ENCOUNTER — Inpatient Hospital Stay: Payer: BLUE CROSS/BLUE SHIELD

## 2018-09-03 ENCOUNTER — Inpatient Hospital Stay (HOSPITAL_BASED_OUTPATIENT_CLINIC_OR_DEPARTMENT_OTHER): Payer: BLUE CROSS/BLUE SHIELD | Admitting: Hematology and Oncology

## 2018-09-03 ENCOUNTER — Inpatient Hospital Stay: Payer: BLUE CROSS/BLUE SHIELD | Admitting: Nutrition

## 2018-09-03 ENCOUNTER — Inpatient Hospital Stay: Payer: BLUE CROSS/BLUE SHIELD | Attending: Hematology and Oncology

## 2018-09-03 VITALS — BP 114/82 | HR 84 | Temp 98.4°F | Resp 16

## 2018-09-03 VITALS — BP 109/77 | HR 92 | Temp 98.2°F | Resp 16 | Ht 67.0 in | Wt 193.1 lb

## 2018-09-03 DIAGNOSIS — R002 Palpitations: Secondary | ICD-10-CM

## 2018-09-03 DIAGNOSIS — C50411 Malignant neoplasm of upper-outer quadrant of right female breast: Secondary | ICD-10-CM

## 2018-09-03 DIAGNOSIS — E860 Dehydration: Secondary | ICD-10-CM | POA: Insufficient documentation

## 2018-09-03 DIAGNOSIS — Z171 Estrogen receptor negative status [ER-]: Secondary | ICD-10-CM

## 2018-09-03 DIAGNOSIS — Z79899 Other long term (current) drug therapy: Secondary | ICD-10-CM | POA: Insufficient documentation

## 2018-09-03 DIAGNOSIS — Z5111 Encounter for antineoplastic chemotherapy: Secondary | ICD-10-CM | POA: Diagnosis present

## 2018-09-03 DIAGNOSIS — Z95828 Presence of other vascular implants and grafts: Secondary | ICD-10-CM

## 2018-09-03 DIAGNOSIS — R110 Nausea: Secondary | ICD-10-CM

## 2018-09-03 DIAGNOSIS — R112 Nausea with vomiting, unspecified: Secondary | ICD-10-CM

## 2018-09-03 LAB — CBC WITH DIFFERENTIAL (CANCER CENTER ONLY)
Abs Immature Granulocytes: 1.44 10*3/uL — ABNORMAL HIGH (ref 0.00–0.07)
Basophils Absolute: 0.3 10*3/uL — ABNORMAL HIGH (ref 0.0–0.1)
Basophils Relative: 2 %
Eosinophils Absolute: 0.1 10*3/uL (ref 0.0–0.5)
Eosinophils Relative: 1 %
HEMATOCRIT: 35.5 % — AB (ref 36.0–46.0)
Hemoglobin: 11.9 g/dL — ABNORMAL LOW (ref 12.0–15.0)
Immature Granulocytes: 10 %
Lymphocytes Relative: 11 %
Lymphs Abs: 1.6 10*3/uL (ref 0.7–4.0)
MCH: 32 pg (ref 26.0–34.0)
MCHC: 33.5 g/dL (ref 30.0–36.0)
MCV: 95.4 fL (ref 80.0–100.0)
Monocytes Absolute: 1.4 10*3/uL — ABNORMAL HIGH (ref 0.1–1.0)
Monocytes Relative: 10 %
Neutro Abs: 9.2 10*3/uL — ABNORMAL HIGH (ref 1.7–7.7)
Neutrophils Relative %: 66 %
Platelet Count: 175 10*3/uL (ref 150–400)
RBC: 3.72 MIL/uL — ABNORMAL LOW (ref 3.87–5.11)
RDW: 16.4 % — ABNORMAL HIGH (ref 11.5–15.5)
WBC Count: 14 10*3/uL — ABNORMAL HIGH (ref 4.0–10.5)
nRBC: 0.2 % (ref 0.0–0.2)

## 2018-09-03 LAB — COMPREHENSIVE METABOLIC PANEL
ALT: 15 U/L (ref 0–44)
ANION GAP: 6 (ref 5–15)
AST: 16 U/L (ref 15–41)
Albumin: 4.1 g/dL (ref 3.5–5.0)
Alkaline Phosphatase: 76 U/L (ref 38–126)
BUN: 15 mg/dL (ref 6–20)
CO2: 27 mmol/L (ref 22–32)
Calcium: 9.1 mg/dL (ref 8.9–10.3)
Chloride: 103 mmol/L (ref 98–111)
Creatinine, Ser: 0.79 mg/dL (ref 0.44–1.00)
GFR calc Af Amer: >60 mL/min (ref >60)
GFR calc non Af Amer: >60 mL/min (ref >60)
Glucose, Bld: 83 mg/dL (ref 70–99)
POTASSIUM: 4.1 mmol/L (ref 3.5–5.1)
SODIUM: 136 mmol/L (ref 135–145)
Total Bilirubin: 0.4 mg/dL (ref 0.3–1.2)
Total Protein: 6.9 g/dL (ref 6.5–8.1)

## 2018-09-03 MED ORDER — HEPARIN SOD (PORK) LOCK FLUSH 100 UNIT/ML IV SOLN
500.0000 [IU] | Freq: Once | INTRAVENOUS | Status: AC | PRN
  Administered 2018-09-03: 500 [IU]
  Filled 2018-09-03: qty 5

## 2018-09-03 MED ORDER — PALONOSETRON HCL INJECTION 0.25 MG/5ML
INTRAVENOUS | Status: AC
  Filled 2018-09-03: qty 5

## 2018-09-03 MED ORDER — SODIUM CHLORIDE 0.9 % IV SOLN
Freq: Once | INTRAVENOUS | Status: AC
  Administered 2018-09-03: 11:00:00 via INTRAVENOUS
  Filled 2018-09-03: qty 250

## 2018-09-03 MED ORDER — FAMOTIDINE IN NACL 20-0.9 MG/50ML-% IV SOLN: 20.0000 mg | Freq: Once | INTRAVENOUS | Status: DC

## 2018-09-03 MED ORDER — LORAZEPAM 1 MG PO TABS
ORAL_TABLET | ORAL | Status: AC
  Filled 2018-09-03: qty 1

## 2018-09-03 MED ORDER — SODIUM CHLORIDE 0.9% FLUSH
10.0000 mL | INTRAVENOUS | Status: DC | PRN
  Administered 2018-09-03: 10 mL
  Filled 2018-09-03: qty 10

## 2018-09-03 MED ORDER — DIPHENHYDRAMINE HCL 50 MG/ML IJ SOLN
INTRAMUSCULAR | Status: AC
  Filled 2018-09-03: qty 1

## 2018-09-03 MED ORDER — SODIUM CHLORIDE 0.9 % IV SOLN
700.0000 mg | Freq: Once | INTRAVENOUS | Status: AC
  Administered 2018-09-03: 700 mg via INTRAVENOUS
  Filled 2018-09-03: qty 70

## 2018-09-03 MED ORDER — DIPHENHYDRAMINE HCL 50 MG/ML IJ SOLN
50.0000 mg | Freq: Once | INTRAMUSCULAR | Status: AC
  Administered 2018-09-03: 50 mg via INTRAVENOUS

## 2018-09-03 MED ORDER — LORAZEPAM 1 MG PO TABS
0.5000 mg | ORAL_TABLET | Freq: Once | ORAL | Status: AC
  Administered 2018-09-03: 0.5 mg via SUBLINGUAL

## 2018-09-03 MED ORDER — PALONOSETRON HCL INJECTION 0.25 MG/5ML
0.2500 mg | Freq: Once | INTRAVENOUS | Status: AC
  Administered 2018-09-03: 0.25 mg via INTRAVENOUS

## 2018-09-03 MED ORDER — SODIUM CHLORIDE 0.9 % IV SOLN
Freq: Once | INTRAVENOUS | Status: AC
  Administered 2018-09-03: 11:00:00 via INTRAVENOUS
  Filled 2018-09-03: qty 5

## 2018-09-03 MED ORDER — SODIUM CHLORIDE 0.9 % IV SOLN
20.0000 mg | Freq: Once | INTRAVENOUS | Status: AC
  Administered 2018-09-03: 20 mg via INTRAVENOUS
  Filled 2018-09-03: qty 2

## 2018-09-03 MED ORDER — SODIUM CHLORIDE 0.9 % IV SOLN
80.0000 mg/m2 | Freq: Once | INTRAVENOUS | Status: AC
  Administered 2018-09-03: 162 mg via INTRAVENOUS
  Filled 2018-09-03: qty 27

## 2018-09-03 NOTE — Assessment & Plan Note (Signed)
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%, T1c N0 stage Ib  Recommendation: 1.Neo-Adjuvant chemotherapy with dose dense Adriamycin and Cytoxan x4 followed by Taxol and carboplatinweekly x12 2.Patient prefers bilateral mastectomies plus or minus reconstruction Followed by surveillance --------------------------------------------------------------------------------------------------------------------------------- MRI breast 07/04/2018: Known malignancy right UOQ 1.4 cm, upper central portion right breast 0.7 cm, LIQ left breast 0.7 cm Left breast biopsy 07/14/2018: Fibrocystic changes  Current treatment:Completed 4 cycles of dose dense Adriamycin and Cytoxan, today cycle 1 Taxol with carboplatin Chemo toxicities: 1.Severe nausea and vomiting in spite of Phenergan, Zofran and Compazine.    Required IV fluids 2.Mouth sores: Improved 3.Severe fatigue: She does not appear to get improvement from this fatigue for the entire 2 weeks. 4.Heartburn is better Return to clinic weekly for Taxol treatments.

## 2018-09-03 NOTE — Progress Notes (Signed)
Nutrition follow-up completed with patient during infusion for triple a negative breast cancer. Weight is stable and documented as 193.1 pounds on March 5. Patient is struggling and was tearful today. She verbalizes how difficult chemotherapy has been for her.  Nutrition diagnosis: Food and nutrition related knowledge deficit resolved.  Provided support and encouragement for patient to continue strategies for adequate calorie and protein intake to continue weight maintenance. I will continue to follow patient as needed and provide support and encouragement.  **Disclaimer: This note was dictated with voice recognition software. Similar sounding words can inadvertently be transcribed and this note may contain transcription errors which may not have been corrected upon publication of note.**

## 2018-09-03 NOTE — Patient Instructions (Signed)
[facility] Discharge Instructions for Patients Receiving Chemotherapy  Today you received the following chemotherapy agents :  Taxol, Carboplatin.  To help prevent nausea and vomiting after your treatment, we encourage you to take your nausea medication as prescribed and as instructed by Dr. Lindi Adie.   If you develop nausea and vomiting that is not controlled by your nausea medication, call the clinic.   BELOW ARE SYMPTOMS THAT SHOULD BE REPORTED IMMEDIATELY:  *FEVER GREATER THAN 100.5 F  *CHILLS WITH OR WITHOUT FEVER  NAUSEA AND VOMITING THAT IS NOT CONTROLLED WITH YOUR NAUSEA MEDICATION  *UNUSUAL SHORTNESS OF BREATH  *UNUSUAL BRUISING OR BLEEDING  TENDERNESS IN MOUTH AND THROAT WITH OR WITHOUT PRESENCE OF ULCERS  *URINARY PROBLEMS  *BOWEL PROBLEMS  UNUSUAL RASH Items with * indicate a potential emergency and should be followed up as soon as possible.  Feel free to call the clinic should you have any questions or concerns. The clinic phone number is [phone].  Please show the [place] at check-in to the Emergency Department and triage nurse.

## 2018-09-04 ENCOUNTER — Telehealth: Payer: Self-pay | Admitting: Hematology and Oncology

## 2018-09-04 NOTE — Telephone Encounter (Signed)
Tried to reach regarding update on schedule I did leave a message

## 2018-09-08 ENCOUNTER — Other Ambulatory Visit: Payer: Self-pay | Admitting: Emergency Medicine

## 2018-09-08 ENCOUNTER — Inpatient Hospital Stay (HOSPITAL_BASED_OUTPATIENT_CLINIC_OR_DEPARTMENT_OTHER): Payer: BLUE CROSS/BLUE SHIELD | Admitting: Medical

## 2018-09-08 ENCOUNTER — Ambulatory Visit (HOSPITAL_COMMUNITY): Payer: BLUE CROSS/BLUE SHIELD | Attending: Cardiovascular Disease

## 2018-09-08 ENCOUNTER — Other Ambulatory Visit: Payer: Self-pay

## 2018-09-08 VITALS — BP 103/80 | HR 97 | Temp 98.2°F | Resp 20 | Ht 67.0 in

## 2018-09-08 DIAGNOSIS — Z79899 Other long term (current) drug therapy: Secondary | ICD-10-CM | POA: Diagnosis not present

## 2018-09-08 DIAGNOSIS — R002 Palpitations: Secondary | ICD-10-CM | POA: Diagnosis not present

## 2018-09-08 DIAGNOSIS — R112 Nausea with vomiting, unspecified: Secondary | ICD-10-CM

## 2018-09-08 DIAGNOSIS — Z171 Estrogen receptor negative status [ER-]: Secondary | ICD-10-CM | POA: Diagnosis not present

## 2018-09-08 DIAGNOSIS — E860 Dehydration: Secondary | ICD-10-CM

## 2018-09-08 DIAGNOSIS — Z95828 Presence of other vascular implants and grafts: Secondary | ICD-10-CM

## 2018-09-08 DIAGNOSIS — C50411 Malignant neoplasm of upper-outer quadrant of right female breast: Secondary | ICD-10-CM | POA: Diagnosis not present

## 2018-09-08 DIAGNOSIS — R110 Nausea: Secondary | ICD-10-CM

## 2018-09-08 LAB — CMP (CANCER CENTER ONLY)
ALT: 14 U/L (ref 0–44)
AST: 14 U/L — ABNORMAL LOW (ref 15–41)
Albumin: 3.6 g/dL (ref 3.5–5.0)
Alkaline Phosphatase: 56 U/L (ref 38–126)
Anion gap: 7 (ref 5–15)
BUN: 13 mg/dL (ref 6–20)
CHLORIDE: 103 mmol/L (ref 98–111)
CO2: 27 mmol/L (ref 22–32)
Calcium: 8.9 mg/dL (ref 8.9–10.3)
Creatinine: 0.67 mg/dL (ref 0.44–1.00)
GFR, Est AFR Am: >60 mL/min (ref >60)
GFR, Estimated: >60 mL/min (ref >60)
Glucose, Bld: 107 mg/dL — ABNORMAL HIGH (ref 70–99)
POTASSIUM: 3.7 mmol/L (ref 3.5–5.1)
SODIUM: 137 mmol/L (ref 135–145)
Total Bilirubin: 0.6 mg/dL (ref 0.3–1.2)
Total Protein: 6.5 g/dL (ref 6.5–8.1)

## 2018-09-08 LAB — CBC WITH DIFFERENTIAL (CANCER CENTER ONLY)
Abs Immature Granulocytes: 0.02 10*3/uL (ref 0.00–0.07)
Basophils Absolute: 0 10*3/uL (ref 0.0–0.1)
Basophils Relative: 1 %
Eosinophils Absolute: 0.1 10*3/uL (ref 0.0–0.5)
Eosinophils Relative: 2 %
HCT: 32.7 % — ABNORMAL LOW (ref 36.0–46.0)
Hemoglobin: 11.3 g/dL — ABNORMAL LOW (ref 12.0–15.0)
IMMATURE GRANULOCYTES: 1 %
Lymphocytes Relative: 20 %
Lymphs Abs: 0.7 10*3/uL (ref 0.7–4.0)
MCH: 32.2 pg (ref 26.0–34.0)
MCHC: 34.6 g/dL (ref 30.0–36.0)
MCV: 93.2 fL (ref 80.0–100.0)
Monocytes Absolute: 0.2 10*3/uL (ref 0.1–1.0)
Monocytes Relative: 6 %
NEUTROS PCT: 70 %
Neutro Abs: 2.5 10*3/uL (ref 1.7–7.7)
Platelet Count: 171 10*3/uL (ref 150–400)
RBC: 3.51 MIL/uL — ABNORMAL LOW (ref 3.87–5.11)
RDW: 15.5 % (ref 11.5–15.5)
WBC Count: 3.5 10*3/uL — ABNORMAL LOW (ref 4.0–10.5)
nRBC: 0 % (ref 0.0–0.2)

## 2018-09-08 LAB — URINALYSIS, COMPLETE (UACMP) WITH MICROSCOPIC
Bilirubin Urine: NEGATIVE
Glucose, UA: NEGATIVE mg/dL
HGB URINE DIPSTICK: NEGATIVE
KETONES UR: NEGATIVE mg/dL
Leukocytes,Ua: NEGATIVE
Nitrite: NEGATIVE
Protein, ur: NEGATIVE mg/dL
Specific Gravity, Urine: 1.006 (ref 1.005–1.030)
pH: 7 (ref 5.0–8.0)

## 2018-09-08 MED ORDER — SODIUM CHLORIDE 0.9 % IV SOLN: 10.0000 mg | Freq: Once | INTRAVENOUS | Status: DC

## 2018-09-08 MED ORDER — PROMETHAZINE HCL 25 MG/ML IJ SOLN
INTRAMUSCULAR | Status: AC
  Filled 2018-09-08: qty 1

## 2018-09-08 MED ORDER — OLANZAPINE 10 MG PO TBDP: ORAL_TABLET | ORAL | 0 refills | Status: DC

## 2018-09-08 MED ORDER — PROMETHAZINE HCL 25 MG/ML IJ SOLN
12.5000 mg | Freq: Once | INTRAMUSCULAR | Status: AC
  Administered 2018-09-08: 12.5 mg via INTRAVENOUS

## 2018-09-08 MED ORDER — SODIUM CHLORIDE 0.9% FLUSH
10.0000 mL | INTRAVENOUS | Status: DC | PRN
  Administered 2018-09-08: 10 mL
  Filled 2018-09-08: qty 10

## 2018-09-08 MED ORDER — HEPARIN SOD (PORK) LOCK FLUSH 100 UNIT/ML IV SOLN
500.0000 [IU] | Freq: Once | INTRAVENOUS | Status: AC | PRN
  Administered 2018-09-08: 500 [IU]
  Filled 2018-09-08: qty 5

## 2018-09-08 MED ORDER — DEXAMETHASONE SODIUM PHOSPHATE 10 MG/ML IJ SOLN
INTRAMUSCULAR | Status: AC
  Filled 2018-09-08: qty 1

## 2018-09-08 MED ORDER — DEXAMETHASONE SODIUM PHOSPHATE 10 MG/ML IJ SOLN
10.0000 mg | Freq: Once | INTRAMUSCULAR | Status: AC
  Administered 2018-09-08: 10 mg via INTRAVENOUS

## 2018-09-08 MED ORDER — DEXAMETHASONE 4 MG PO TABS: ORAL_TABLET | ORAL | 0 refills | Status: DC

## 2018-09-08 MED ORDER — SODIUM CHLORIDE 0.9 % IV SOLN
Freq: Once | INTRAVENOUS | Status: AC
  Administered 2018-09-08: 10:00:00 via INTRAVENOUS
  Filled 2018-09-08: qty 250

## 2018-09-08 NOTE — Patient Instructions (Signed)

## 2018-09-08 NOTE — Progress Notes (Signed)
Pt given IVF and antiemetics today, tolerated well.  Reports feeling better at end of infusion.  Able to eat and drink some during visit.

## 2018-09-09 LAB — URINE CULTURE: Culture: NO GROWTH

## 2018-09-09 NOTE — Progress Notes (Signed)
[facility] Progress Note   Holly Wiley [id number] 10/07/1975 43 y.o.  Holly Wiley is managed by Dr. Nicholas Lose  Actively treated with chemotherapy/immunotherapy/hormonal therapy: yes  Current therapy: Paclitaxel  Last treated: 09/03/2018 (cycle 5, day 1)  Next scheduled appointment with provider: 09/10/2018  Assessment: Plan:    Intractable vomiting with nausea, unspecified vomiting type - Plan: promethazine (PHENERGAN) injection 12.5 mg, 0.9 %  sodium chloride infusion, dexamethasone (DECADRON) injection 10 mg, OLANZapine zydis (ZYPREXA) 10 MG disintegrating tablet, dexamethasone (DECADRON) 4 MG tablet, heparin lock flush 100 unit/mL, sodium chloride flush (NS) 0.9 % injection 10 mL  Dehydration - Plan: heparin lock flush 100 unit/mL, sodium chloride flush (NS) 0.9 % injection 10 mL  Port-A-Cath in place - Plan: heparin lock flush 100 unit/mL, sodium chloride flush (NS) 0.9 % injection 10 mL  Malignant neoplasm of upper-outer quadrant of right breast in female, estrogen receptor negative (HCC)   Nausea and vomiting: The patient was given Phenergan 12.5 mg IV and Decadron 10 mg IV.  She was also given a prescription for Zyprexa 10 mg once daily for days 1 through 4 after chemotherapy and Decadron 4 mg once daily for days 1 through 4 after chemotherapy.  Dehydration: The patient was given 1 L of normal saline IV.  ER negative malignant neoplasm of the right breast: The patient is status post cycle 5, day 1 of paclitaxel which was dosed on 09/03/2018.  She will return to the clinic on 09/10/2018 for consideration of cycle 5, day 8 of chemotherapy.  Please see After Visit Summary for patient specific instructions.  Future Appointments  Date Time Provider [place]  09/10/2018  9:45 AM [facility] 6 [facility] None  09/10/2018 10:00 AM [facility] [place] FLUSH [facility] None  09/10/2018 10:30 AM Gardenia Phlegm, NP [facility] None   09/10/2018 12:15 PM [facility] INFUSION [facility] None  09/17/2018 11:45 AM [facility] LAB 4 [facility] None  09/17/2018 12:15 PM [facility] [place] FLUSH [facility] None  09/17/2018  1:00 PM [facility] INFUSION [facility] None  09/24/2018  8:15 AM [facility] LAB 4 [facility] None  09/24/2018  8:45 AM [facility] [place] FLUSH [facility] None  09/24/2018  9:30 AM Nicholas Lose, MD [facility] None  09/24/2018 10:45 AM [facility] INFUSION [facility] None  10/01/2018 11:15 AM [facility] ONLY [facility] None  10/01/2018 11:30 AM [facility] [place] FLUSH [facility] None  10/01/2018  1:00 PM [facility] INFUSION [facility] None  10/01/2018  3:15 PM [place], Barbara L, RD [facility] None  10/08/2018 10:30 AM [facility] LAB 5 [facility] None  10/08/2018 10:45 AM [facility] [place] FLUSH [facility] None  10/08/2018 11:30 AM [place], Charlestine Massed, NP [facility] None  10/08/2018 12:30 PM [facility] INFUSION [facility] None  10/15/2018  8:15 AM [facility] LAB 3 [facility] None  10/15/2018  8:30 AM [facility] [place] FLUSH [facility] None  10/15/2018  9:00 AM Nicholas Lose, MD [facility] None  10/15/2018 10:00 AM [facility] INFUSION [facility] None  10/22/2018 11:45 AM [facility] LAB 4 [facility] None  10/22/2018 12:00 PM [place] FLUSH [facility] None  10/22/2018  1:00 PM [facility] INFUSION [facility] None  10/29/2018 10:15 AM [facility] LAB 3 [facility] None  10/29/2018 10:30 AM [facility] [place] FLUSH [facility] None  10/29/2018 11:00 AM Nicholas Lose, MD [facility] None  10/29/2018 12:00 PM [facility] INFUSION [facility] None  11/05/2018 11:45 AM [facility] LAB 1 [facility] None  11/05/2018 12:00 PM [facility] [place] FLUSH [facility] None  11/05/2018  1:00 PM [facility] INFUSION [facility] None    No orders of the defined types were  placed in this encounter.      Subjective:   Patient ID:  Holly Wiley is a 43 y.o. (DOB [birth year]) female.  Chief Complaint:  Chief Complaint  Patient presents with  . Nausea    HPI  Holly Wiley  is a 43 year old female with a diagnosis of an ER negative malignant neoplasm of the right breast.  She is status post cycle 5, day 1 of paclitaxel.  She presents to the office today with nausea, vomiting, orthostasis, and dehydration.  She was hopeful that she would not have these symptoms after transitioning from Adriamycin and Cytoxan to paclitaxel.  She denies fevers, chills, sweats, constipation, or diarrhea.  She is status post an echocardiogram earlier today.  Medications: I have reviewed the patient's current medications.  Allergies:  Allergies  Allergen Reactions  . Bee Venom Anaphylaxis  . Dihydroergotamine Anaphylaxis  . Imitrex [Sumatriptan] Other (See Comments)    SEIZURE-LIKE ACTIVITY  . Metoclopramide Other (See Comments)    TACHYCARDIA  . Transderm-Scop [Scopolamine] Other (See Comments)    AGITATION    Past Medical History:  Diagnosis Date  . Breast cancer ([place]) 07/2018   right  . Dental crown present   . Family history of bladder cancer   . Family history of breast cancer   . Family history of colon cancer   . Family history of pancreatic cancer   . 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  . Migraines   . PONV (postoperative nausea and vomiting)     Past Surgical History:  Procedure Laterality Date  . BREAST BIOPSY Right 11/08/2013   Procedure: EXCISION OF RIGHT  BREAST MASS;  Surgeon: Adin Hector, MD;  Location: [facility];  Service: General;  Laterality: Right;  . BUNIONECTOMY Left   . CARPAL TUNNEL RELEASE Right 02/26/2017  . CERVICAL CONE BIOPSY  05/2005  . COLONOSCOPY  2014  . PORTACATH PLACEMENT Right 07/08/2018   Procedure: INSERTION PORT-A-CATH;  Surgeon: Fanny Skates, MD;  Location: [facility];  Service: General;  Laterality: Right;  . SHOULDER ARTHROSCOPY W/ LABRAL REPAIR Right   . SHOULDER ARTHROSCOPY W/ ROTATOR CUFF REPAIR Left   . TENOTOMY FOREARM / WRIST  Right 02/26/2017    Family History  Problem Relation Age of Onset  . Breast cancer Mother 39       triple negative, GT neg  . Diabetes Maternal Aunt   . Bladder Cancer Maternal Aunt 69  . Colon cancer Maternal Uncle 57  . Diabetes Maternal Uncle   . Diabetes Maternal Grandmother   . Stroke Paternal Grandfather   . Colon cancer Other 40  . Breast cancer Other        dx >50  . Colon cancer Other        dx>50  . Pancreatic cancer Other     Social History   Socioeconomic History  . Marital status: Divorced    Spouse name: Not on file  . Number of children: Not on file  . Years of education: Not on file  . Highest education level: Not on file  Occupational History  . Not on file  Social Needs  . Financial resource strain: Not on file  . Food insecurity:    Worry: Not on file    Inability: Not on file  . Transportation needs:    Medical: Not on file    Non-medical: Not on file  Tobacco Use  . Smoking status: Never  Smoker  . Smokeless tobacco: Never Used  Substance and Sexual Activity  . Alcohol use: Yes    Comment: occasionally  . Drug use: No  . Sexual activity: Not on file  Lifestyle  . Physical activity:    Days per week: Not on file    Minutes per session: Not on file  . Stress: Not on file  Relationships  . Social connections:    Talks on phone: Not on file    Gets together: Not on file    Attends religious service: Not on file    Active member of club or organization: Not on file    Attends meetings of clubs or organizations: Not on file    Relationship status: Not on file  . Intimate partner violence:    Fear of current or ex partner: Not on file    Emotionally abused: Not on file    Physically abused: Not on file    Forced sexual activity: Not on file  Other Topics Concern  . Not on file  Social History Narrative  . Not on file    Past Medical History, Surgical history, Social history, and Family history were reviewed and updated as  appropriate.   Please see review of systems for further details on the patient's review from today.   Review of Systems:  Review of Systems  Constitutional: Positive for appetite change. Negative for chills, diaphoresis and fever.  Respiratory: Negative for cough, choking, shortness of breath and wheezing.   Cardiovascular: Negative for chest pain and palpitations.  Gastrointestinal: Positive for nausea and vomiting. Negative for constipation and diarrhea.  Genitourinary: Negative for decreased urine volume.  Neurological: Positive for dizziness (Positional dizziness). Negative for headaches.    Objective:   Physical Exam:  BP 103/80 (BP Location: Right Arm, Patient Position: Sitting)   Pulse 97   Temp 98.2 F (36.8 C) (Oral)   Resp 20   Ht 5\' 7"  (1.702 m)   SpO2 97%   BMI 30.24 kg/m  ECOG: 1  Physical Exam Constitutional:      General: She is not in acute distress.    Appearance: She is not diaphoretic.  HENT:     Head: Normocephalic and atraumatic.     Mouth/Throat:     Mouth: Mucous membranes are dry.     Pharynx: Oropharynx is clear. No oropharyngeal exudate or posterior oropharyngeal erythema.  Eyes:     General: No scleral icterus.       Right eye: No discharge.        Left eye: No discharge.     Conjunctiva/sclera: Conjunctivae normal.  Cardiovascular:     Rate and Rhythm: Normal rate and regular rhythm.     Heart sounds: Normal heart sounds. No murmur. No friction rub. No gallop.   Pulmonary:     Effort: Pulmonary effort is normal. No respiratory distress.     Breath sounds: Normal breath sounds. No stridor. No wheezing or rales.  Abdominal:     General: Bowel sounds are normal. There is no distension.     Palpations: Abdomen is soft. There is no mass.     Tenderness: There is no abdominal tenderness. There is no guarding or rebound.  Skin:    General: Skin is warm and dry.  Neurological:     Mental Status: She is alert.     Coordination: Coordination  normal.     Gait: Gait normal.  Psychiatric:        Mood and Affect:  Mood normal.        Behavior: Behavior normal.        Thought Content: Thought content normal.        Judgment: Judgment normal.     Lab Review:     Component Value Date/Time   NA 137 09/08/2018 0909   K 3.7 09/08/2018 0909   CL 103 09/08/2018 0909   CO2 27 09/08/2018 0909   GLUCOSE 107 (H) 09/08/2018 0909   BUN 13 09/08/2018 0909   CREATININE 0.67 09/08/2018 0909   CALCIUM 8.9 09/08/2018 0909   PROT 6.5 09/08/2018 0909   ALBUMIN 3.6 09/08/2018 0909   AST 14 (L) 09/08/2018 0909   ALT 14 09/08/2018 0909   ALKPHOS 56 09/08/2018 0909   BILITOT 0.6 09/08/2018 0909   GFRNONAA >60 09/08/2018 0909   GFRAA >60 09/08/2018 0909       Component Value Date/Time   WBC 3.5 (L) 09/08/2018 0909   WBC 2.8 (L) 10/01/2005 1330   RBC 3.51 (L) 09/08/2018 0909   HGB 11.3 (L) 09/08/2018 0909   HGB 14.0 10/01/2005 1330   HCT 32.7 (L) 09/08/2018 0909   HCT 39.9 10/01/2005 1330   PLT 171 09/08/2018 0909   PLT 219 10/01/2005 1330   MCV 93.2 09/08/2018 0909   MCV 92.3 10/01/2005 1330   MCH 32.2 09/08/2018 0909   MCHC 34.6 09/08/2018 0909   RDW 15.5 09/08/2018 0909   RDW 13.0 10/01/2005 1330   LYMPHSABS 0.7 09/08/2018 0909   LYMPHSABS 1.3 10/01/2005 1330   MONOABS 0.2 09/08/2018 0909   MONOABS 0.3 10/01/2005 1330   EOSABS 0.1 09/08/2018 0909   EOSABS 0.0 10/01/2005 1330   BASOSABS 0.0 09/08/2018 0909   BASOSABS 0.0 10/01/2005 1330   -------------------------------  Imaging from last 24 hours (if applicable):  Radiology interpretation: No results found.

## 2018-09-09 NOTE — Progress Notes (Signed)
These preliminary result these preliminary results were noted.  Awaiting final report.

## 2018-09-10 ENCOUNTER — Inpatient Hospital Stay: Payer: BLUE CROSS/BLUE SHIELD

## 2018-09-10 ENCOUNTER — Inpatient Hospital Stay (HOSPITAL_BASED_OUTPATIENT_CLINIC_OR_DEPARTMENT_OTHER): Payer: BLUE CROSS/BLUE SHIELD | Admitting: Adult Health

## 2018-09-10 ENCOUNTER — Other Ambulatory Visit: Payer: Self-pay

## 2018-09-10 ENCOUNTER — Encounter: Payer: Self-pay | Admitting: Adult Health

## 2018-09-10 VITALS — BP 101/55 | HR 95 | Temp 98.6°F | Resp 18 | Ht 67.0 in | Wt 195.0 lb

## 2018-09-10 DIAGNOSIS — C50411 Malignant neoplasm of upper-outer quadrant of right female breast: Secondary | ICD-10-CM

## 2018-09-10 DIAGNOSIS — Z171 Estrogen receptor negative status [ER-]: Secondary | ICD-10-CM

## 2018-09-10 DIAGNOSIS — Z79899 Other long term (current) drug therapy: Secondary | ICD-10-CM

## 2018-09-10 LAB — CBC WITH DIFFERENTIAL/PLATELET
Abs Immature Granulocytes: 0.04 10*3/uL (ref 0.00–0.07)
Basophils Absolute: 0.1 10*3/uL (ref 0.0–0.1)
Basophils Relative: 2 %
Eosinophils Absolute: 0.1 10*3/uL (ref 0.0–0.5)
Eosinophils Relative: 2 %
HCT: 31.4 % — ABNORMAL LOW (ref 36.0–46.0)
HCT: 31.9 % — ABNORMAL LOW (ref 36.0–46.0)
HEMOGLOBIN: 10.7 g/dL — AB (ref 12.0–15.0)
Hemoglobin: 10.6 g/dL — ABNORMAL LOW (ref 12.0–15.0)
Immature Granulocytes: 1 %
LYMPHS PCT: 26 %
Lymphs Abs: 1.1 10*3/uL (ref 0.7–4.0)
MCH: 31.8 pg (ref 26.0–34.0)
MCH: 32.4 pg (ref 26.0–34.0)
MCHC: 33.2 g/dL (ref 30.0–36.0)
MCHC: 34.1 g/dL (ref 30.0–36.0)
MCV: 95.2 fL (ref 80.0–100.0)
MCV: 95.8 fL (ref 80.0–100.0)
MONO ABS: 0.5 10*3/uL (ref 0.1–1.0)
Monocytes Relative: 11 %
NRBC: 0 % (ref 0.0–0.2)
Neutro Abs: 2.5 10*3/uL (ref 1.7–7.7)
Neutrophils Relative %: 58 %
Platelets: 205 10*3/uL (ref 150–400)
Platelets: 216 10*3/uL (ref 150–400)
RBC: 3.33 MIL/uL — AB (ref 3.87–5.11)
RBC: 3.3 MIL/uL — ABNORMAL LOW (ref 3.87–5.11)
RDW: 15.8 % — ABNORMAL HIGH (ref 11.5–15.5)
RDW: 15.9 % — ABNORMAL HIGH (ref 11.5–15.5)
WBC: 4.1 10*3/uL (ref 4.0–10.5)
WBC: 4.3 10*3/uL (ref 4.0–10.5)
nRBC: 0 % (ref 0.0–0.2)

## 2018-09-10 LAB — COMPREHENSIVE METABOLIC PANEL
ALT: 12 U/L (ref 0–44)
AST: 13 U/L — AB (ref 15–41)
Albumin: 3.6 g/dL (ref 3.5–5.0)
Alkaline Phosphatase: 58 U/L (ref 38–126)
Anion gap: 8 (ref 5–15)
BUN: 13 mg/dL (ref 6–20)
CO2: 26 mmol/L (ref 22–32)
Calcium: 9 mg/dL (ref 8.9–10.3)
Chloride: 106 mmol/L (ref 98–111)
Creatinine, Ser: 0.82 mg/dL (ref 0.44–1.00)
GFR calc Af Amer: >60 mL/min (ref >60)
GFR calc non Af Amer: >60 mL/min (ref >60)
Glucose, Bld: 81 mg/dL (ref 70–99)
Potassium: 4.3 mmol/L (ref 3.5–5.1)
Sodium: 140 mmol/L (ref 135–145)
TOTAL PROTEIN: 6.4 g/dL — AB (ref 6.5–8.1)
Total Bilirubin: 0.4 mg/dL (ref 0.3–1.2)

## 2018-09-10 MED ORDER — SODIUM CHLORIDE 0.9 % IV SOLN
80.0000 mg/m2 | Freq: Once | INTRAVENOUS | Status: AC
  Administered 2018-09-10: 162 mg via INTRAVENOUS
  Filled 2018-09-10: qty 27

## 2018-09-10 MED ORDER — SODIUM CHLORIDE 0.9 % IV SOLN: Freq: Once | INTRAVENOUS | Status: DC

## 2018-09-10 MED ORDER — SODIUM CHLORIDE 0.9 % IV SOLN
20.0000 mg | Freq: Once | INTRAVENOUS | Status: AC
  Administered 2018-09-10: 20 mg via INTRAVENOUS
  Filled 2018-09-10: qty 2

## 2018-09-10 MED ORDER — PROMETHAZINE HCL 25 MG/ML IJ SOLN
12.5000 mg | Freq: Once | INTRAMUSCULAR | Status: AC
  Administered 2018-09-10: 12.5 mg via INTRAVENOUS

## 2018-09-10 MED ORDER — SODIUM CHLORIDE 0.9% FLUSH
10.0000 mL | INTRAVENOUS | Status: DC | PRN
  Administered 2018-09-10: 10 mL
  Filled 2018-09-10: qty 10

## 2018-09-10 MED ORDER — DIPHENHYDRAMINE HCL 50 MG/ML IJ SOLN
INTRAMUSCULAR | Status: AC
  Filled 2018-09-10: qty 1

## 2018-09-10 MED ORDER — HEPARIN SOD (PORK) LOCK FLUSH 100 UNIT/ML IV SOLN
500.0000 [IU] | Freq: Once | INTRAVENOUS | Status: AC | PRN
  Administered 2018-09-10: 500 [IU]
  Filled 2018-09-10: qty 5

## 2018-09-10 MED ORDER — SODIUM CHLORIDE 0.9 % IV SOLN
Freq: Once | INTRAVENOUS | Status: AC
  Administered 2018-09-10: 12:00:00 via INTRAVENOUS
  Filled 2018-09-10: qty 250

## 2018-09-10 MED ORDER — SODIUM CHLORIDE 0.9 % IV SOLN
Freq: Once | INTRAVENOUS | Status: AC
  Administered 2018-09-10: 13:00:00 via INTRAVENOUS
  Filled 2018-09-10: qty 5

## 2018-09-10 MED ORDER — SODIUM CHLORIDE 0.9 % IV SOLN: 10.0000 mg | Freq: Once | INTRAVENOUS | Status: DC

## 2018-09-10 MED ORDER — PALONOSETRON HCL INJECTION 0.25 MG/5ML
0.2500 mg | Freq: Once | INTRAVENOUS | Status: AC
  Administered 2018-09-10: 0.25 mg via INTRAVENOUS

## 2018-09-10 MED ORDER — DIPHENHYDRAMINE HCL 50 MG/ML IJ SOLN
25.0000 mg | Freq: Once | INTRAMUSCULAR | Status: AC
  Administered 2018-09-10: 25 mg via INTRAVENOUS

## 2018-09-10 MED ORDER — PALONOSETRON HCL INJECTION 0.25 MG/5ML
INTRAVENOUS | Status: AC
  Filled 2018-09-10: qty 5

## 2018-09-10 MED ORDER — PROMETHAZINE HCL 25 MG/ML IJ SOLN
INTRAMUSCULAR | Status: AC
  Filled 2018-09-10: qty 1

## 2018-09-10 MED ORDER — FAMOTIDINE IN NACL 20-0.9 MG/50ML-% IV SOLN: 20.0000 mg | Freq: Once | INTRAVENOUS | Status: DC

## 2018-09-10 NOTE — Assessment & Plan Note (Addendum)
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%, T1c N0 stage Ib  Recommendation: 1.Neo-Adjuvant chemotherapy with dose dense Adriamycin and Cytoxan x4 followed by Taxol and carboplatinweekly x12 2.Patient prefers bilateral mastectomies plus or minus reconstruction Followed by surveillance --------------------------------------------------------------------------------------------------------------------------------- MRI breast 07/04/2018: Known malignancy right UOQ 1.4 cm, upper central portion right breast 0.7 cm, LIQ left breast 0.7 cm Left breast biopsy 07/14/2018: Fibrocystic changes  Current treatment:Completed 4 cycles of dose dense Adriamycin and Cytoxan, today cycle 2 Taxol with carboplatin given every three weeks Chemo toxicities: 1. Nausea and vomiting: added fosaprepitant and Aloxi to her regimen, and phenergan as premed 2.Mouth sores: Improved 3.Severe fatigue: I let her know that sometimes the Taxol can cause achiness after the first dose.  However, there are many options here for her treatment which I will list below: -She can stop chemotherapy now, she got a majority of her benefit to reduce the risk of recurrence with the Adriamycin and cytoxan, we can do MRI and send her to surgery -We can stop the carboplatin (due in 2 weeks, not today) which should help the nausea some, particularly since her tumor was small to begin with and she is BRCA negative -We can hold chemotherapy for a week and she can come back next week and see how she feels.   She would like to proceed with chemotherapy today.  I reviewed and changed her premeds with Dr. Lindi Adie.  I offered Laquitta an ultrasound to evaluate her right axilla.  She declined.  We will see her back with every other treatment.    Return  to clinic weekly for Taxol treatments.

## 2018-09-10 NOTE — Progress Notes (Signed)
[facility] Follow up:    [name], NP [place] [address]   DIAGNOSIS: Cancer Staging Malignant neoplasm of upper-outer quadrant of right breast in female, estrogen receptor negative ([place]) Staging form: Breast, AJCC 8th Edition - Clinical stage from 06/29/2018: Stage IB (cT1c, cN0, cM0, G3, ER-, PR-, HER2-) - Signed by Nicholas Lose, MD on 06/29/2018   SUMMARY OF ONCOLOGIC HISTORY:   Malignant neoplasm of upper-outer quadrant of right breast in female, estrogen receptor negative ([place])   06/22/2018 Initial Diagnosis    Screening mammogram detected right breast mass 1.1 cm at 10 o'clock position right breast 12 cm from the nipple, no abnormal lymph nodes, biopsy of the mass revealed IDC with DCIS grade 3, ER 0%, PR 0%, HER-2 -1+ by IHC, Ki-67 40%, T1c N0 stage Ib    06/29/2018 Cancer Staging    Staging form: Breast, AJCC 8th Edition - Clinical stage from 06/29/2018: Stage IB (cT1c, cN0, cM0, G3, ER-, PR-, HER2-) - Signed by Nicholas Lose, MD on 06/29/2018    07/09/2018 -  Neo-Adjuvant Chemotherapy     Neo- Adjuvant chemotherapy with dose dense Adriamycin and Cytoxan x4 followed by Taxol and carboplatin weekly x12     CURRENT THERAPY: Taxol/carbo  INTERVAL HISTORY: Holly Wiley 43 y.o. female returns for evaluation after receiving her first cycle of neoadjuvant Taxol/Carbo.  She will receive the Taxol weekly and the Carboplatin once every 3 weeks.  She notes she is still struggling with feeling miserable with chemo.  She has had increased nasuea and vomiting, decreased blood pressure, slight deconditioning.  She is tearful and wonders how much longer she will be able to continue to receive chemotherapy.  Today she is doing moderately better, but she is anxious about undergoing further treatment.  She notes a waxing and waning area in her right axilla that feels like a small marble that has been going on for 2 weeks.  She says that Dr. Dalbert Batman and Dr.  Lindi Adie have both evaluated it.     Patient Active Problem List   Diagnosis Date Noted  . Genetic testing 08/20/2018  . 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 Dehydration 07/24/2018  . Nausea and vomiting 07/24/2018  . Nausea without vomiting 07/24/2018  . Family history of breast cancer   . Family history of colon cancer   . Family history of bladder cancer   . Family history of pancreatic cancer   . Port-A-Cath in place 07/16/2018  . Malignant neoplasm of upper-outer quadrant of right breast in female, estrogen receptor negative ([place]) 06/29/2018  . 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 Macromastia 11/19/2013  . Breast mass, right 10/20/2013  . Abnormal Pap smear 01/01/2012    is allergic to bee venom; dihydroergotamine; imitrex [sumatriptan]; metoclopramide; and transderm-scop [scopolamine].  MEDICAL HISTORY: Past Medical History:  Diagnosis Date  . Breast cancer ([place]) 07/2018   right  . Dental crown present   . Family history of bladder cancer   . Family history of breast cancer   . Family history of colon cancer   . Family history of pancreatic cancer   . 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  . Migraines   . PONV (postoperative nausea and vomiting)     SURGICAL HISTORY: Past Surgical History:  Procedure Laterality Date  . BREAST BIOPSY Right 11/08/2013   Procedure: EXCISION OF RIGHT  BREAST MASS;  Surgeon: Adin Hector,  MD;  Location: [facility];  Service: General;  Laterality: Right;  . BUNIONECTOMY Left   . CARPAL TUNNEL RELEASE Right 02/26/2017  . CERVICAL CONE BIOPSY  05/2005  . COLONOSCOPY  2014  . PORTACATH PLACEMENT Right 07/08/2018   Procedure: INSERTION PORT-A-CATH;  Surgeon: Fanny Skates, MD;  Location: [facility];  Service: General;  Laterality: Right;  . SHOULDER ARTHROSCOPY W/ LABRAL REPAIR Right   . SHOULDER ARTHROSCOPY W/ ROTATOR  CUFF REPAIR Left   . TENOTOMY FOREARM / WRIST Right 02/26/2017    SOCIAL HISTORY: Social History   Socioeconomic History  . Marital status: Divorced    Spouse name: Not on file  . Number of children: Not on file  . Years of education: Not on file  . Highest education level: Not on file  Occupational History  . Not on file  Social Needs  . Financial resource strain: Not on file  . Food insecurity:    Worry: Not on file    Inability: Not on file  . Transportation needs:    Medical: Not on file    Non-medical: Not on file  Tobacco Use  . Smoking status: Never Smoker  . Smokeless tobacco: Never Used  Substance and Sexual Activity  . Alcohol use: Yes    Comment: occasionally  . Drug use: No  . Sexual activity: Not on file  Lifestyle  . Physical activity:    Days per week: Not on file    Minutes per session: Not on file  . Stress: Not on file  Relationships  . Social connections:    Talks on phone: Not on file    Gets together: Not on file    Attends religious service: Not on file    Active member of club or organization: Not on file    Attends meetings of clubs or organizations: Not on file    Relationship status: Not on file  . Intimate partner violence:    Fear of current or ex partner: Not on file    Emotionally abused: Not on file    Physically abused: Not on file    Forced sexual activity: Not on file  Other Topics Concern  . Not on file  Social History Narrative  . Not on file    FAMILY HISTORY: Family History  Problem Relation Age of Onset  . Breast cancer Mother 31       triple negative, GT neg  . Diabetes Maternal Aunt   . Bladder Cancer Maternal Aunt 69  . Colon cancer Maternal Uncle 86  . Diabetes Maternal Uncle   . Diabetes Maternal Grandmother   . Stroke Paternal Grandfather   . Colon cancer Other 15  . Breast cancer Other        dx >50  . Colon cancer Other        dx>50  . Pancreatic cancer Other     Review of Systems   Constitutional: Positive for fatigue. Negative for appetite change, chills and unexpected weight change.  HENT:   Positive for mouth sores and sore throat. Negative for hearing loss, lump/mass and trouble swallowing.   Eyes: Negative for eye problems and icterus.  Respiratory: Negative for chest tightness, cough and shortness of breath.   Cardiovascular: Positive for palpitations (improving, more noticeable during her first chemotherapy regimen).  Gastrointestinal: Positive for nausea and vomiting. Negative for abdominal distention, abdominal pain, constipation and diarrhea.  Endocrine: Negative for hot flashes.  Musculoskeletal: Positive for arthralgias, back  pain and flank pain (improving, thinks she pulled a muscle).  Skin: Negative for itching and rash.  Neurological: Negative for dizziness, extremity weakness, headaches, light-headedness and numbness.  Hematological: Negative for adenopathy. Does not bruise/bleed easily.  Psychiatric/Behavioral: Positive for sleep disturbance. Negative for depression. The patient is nervous/anxious.       PHYSICAL EXAMINATION  ECOG PERFORMANCE STATUS: 2 - Symptomatic, <50% confined to bed  Vitals:   09/10/18 1017  BP: (!) 101/55  Pulse: 95  Resp: 18  Temp: 98.6 F (37 C)  SpO2: 98%    Physical Exam Constitutional:      General: She is not in acute distress.    Appearance: Normal appearance. She is not toxic-appearing.  HENT:     Head: Normocephalic and atraumatic.     Mouth/Throat:     Mouth: Mucous membranes are moist.     Pharynx: Oropharynx is clear. No oropharyngeal exudate or posterior oropharyngeal erythema.  Eyes:     General: No scleral icterus.    Pupils: Pupils are equal, round, and reactive to light.  Neck:     Musculoskeletal: Neck supple.  Cardiovascular:     Rate and Rhythm: Normal rate and regular rhythm.     Pulses: Normal pulses.     Heart sounds: Normal heart sounds.  Pulmonary:     Effort: Pulmonary effort is  normal.     Breath sounds: Normal breath sounds.     Comments: Unable to palpate breast mass, or axillary adenopathy  Abdominal:     General: Abdomen is flat. Bowel sounds are normal. There is no distension.     Palpations: Abdomen is soft.     Tenderness: There is no abdominal tenderness.  Musculoskeletal:        General: No swelling.  Lymphadenopathy:     Cervical: No cervical adenopathy.  Skin:    General: Skin is warm and dry.     Capillary Refill: Capillary refill takes less than 2 seconds.     Findings: No rash.  Neurological:     General: No focal deficit present.     Mental Status: She is alert and oriented to person, place, and time.  Psychiatric:        Mood and Affect: Mood normal.        Behavior: Behavior normal.     LABORATORY DATA:  CBC    Component Value Date/Time   WBC 4.3 09/10/2018 1015   RBC 3.30 (L) 09/10/2018 1015   HGB 10.7 (L) 09/10/2018 1015   HGB 11.3 (L) 09/08/2018 0909   HGB 14.0 10/01/2005 1330   HCT 31.4 (L) 09/10/2018 1015   HCT 39.9 10/01/2005 1330   PLT 216 09/10/2018 1015   PLT 171 09/08/2018 0909   PLT 219 10/01/2005 1330   MCV 95.2 09/10/2018 1015   MCV 92.3 10/01/2005 1330   MCH 32.4 09/10/2018 1015   MCHC 34.1 09/10/2018 1015   RDW 15.9 (H) 09/10/2018 1015   RDW 13.0 10/01/2005 1330   LYMPHSABS 1.1 09/10/2018 1015   LYMPHSABS 1.3 10/01/2005 1330   MONOABS 0.5 09/10/2018 1015   MONOABS 0.3 10/01/2005 1330   EOSABS 0.1 09/10/2018 1015   EOSABS 0.0 10/01/2005 1330   BASOSABS 0.1 09/10/2018 1015   BASOSABS 0.0 10/01/2005 1330    CMP     Component Value Date/Time   NA 140 09/10/2018 1004   K 4.3 09/10/2018 1004   CL 106 09/10/2018 1004   CO2 26 09/10/2018 1004   GLUCOSE 81 09/10/2018 1004  BUN 13 09/10/2018 1004   CREATININE 0.82 09/10/2018 1004   CREATININE 0.67 09/08/2018 0909   CALCIUM 9.0 09/10/2018 1004   PROT 6.4 (L) 09/10/2018 1004   ALBUMIN 3.6 09/10/2018 1004   AST 13 (L) 09/10/2018 1004   AST 14 (L)  09/08/2018 0909   ALT 12 09/10/2018 1004   ALT 14 09/08/2018 0909   ALKPHOS 58 09/10/2018 1004   BILITOT 0.4 09/10/2018 1004   BILITOT 0.6 09/08/2018 0909   GFRNONAA >60 09/10/2018 1004   GFRNONAA >60 09/08/2018 0909   GFRAA >60 09/10/2018 1004   GFRAA >60 09/08/2018 0909         ASSESSMENT and THERAPY PLAN:   Malignant neoplasm of upper-outer quadrant of right breast in female, estrogen receptor negative ([place]) 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%, T1c N0 stage Ib  Recommendation: 1.Neo-Adjuvant chemotherapy with dose dense Adriamycin and Cytoxan x4 followed by Taxol and carboplatinweekly x12 2.Patient prefers bilateral mastectomies plus or minus reconstruction Followed by surveillance --------------------------------------------------------------------------------------------------------------------------------- MRI breast 07/04/2018: Known malignancy right UOQ 1.4 cm, upper central portion right breast 0.7 cm, LIQ left breast 0.7 cm Left breast biopsy 07/14/2018: Fibrocystic changes  Current treatment:Completed 4 cycles of dose dense Adriamycin and Cytoxan, today cycle 2 Taxol with carboplatin given every three weeks Chemo toxicities: 1. Nausea and vomiting: added fosaprepitant and Aloxi to her regimen, and phenergan as premed 2.Mouth sores: Improved 3.Severe fatigue: I let her know that sometimes the Taxol can cause achiness after the first dose.  However, there are many options here for her treatment which I will list below: -She can stop chemotherapy now, she got a majority of her benefit to reduce the risk of recurrence with the Adriamycin and cytoxan, we can do MRI and send her to surgery -We can stop the carboplatin (due in 2 weeks, not today) which should help  the nausea some, particularly since her tumor was small to begin with and she is BRCA negative -We can hold chemotherapy for a week and she can come back next week and see how she feels.   She would like to proceed with chemotherapy today.  I reviewed and changed her premeds with Dr. Lindi Adie.  I offered Analena an ultrasound to evaluate her right axilla.  She declined.  We will see her back with every other treatment.    Return to clinic weekly for Taxol treatments.   All questions were answered. The patient knows to call the clinic with any problems, questions or concerns. We can certainly see the patient much sooner if necessary.  A total of (50) minutes of face-to-face time was spent with this patient with greater than 50% of that time in counseling and care-coordination.  This note was electronically signed. Scot Dock, NP 09/10/2018

## 2018-09-10 NOTE — Progress Notes (Signed)
These preliminary result these preliminary results were noted.  Awaiting final report.

## 2018-09-10 NOTE — Patient Instructions (Signed)
[facility] Discharge Instructions for Patients Receiving Chemotherapy  Today you received the following chemotherapy agents:  Taxol.  To help prevent nausea and vomiting after your treatment, we encourage you to take your nausea medication as directed.   If you develop nausea and vomiting that is not controlled by your nausea medication, call the clinic.   BELOW ARE SYMPTOMS THAT SHOULD BE REPORTED IMMEDIATELY:  *FEVER GREATER THAN 100.5 F  *CHILLS WITH OR WITHOUT FEVER  NAUSEA AND VOMITING THAT IS NOT CONTROLLED WITH YOUR NAUSEA MEDICATION  *UNUSUAL SHORTNESS OF BREATH  *UNUSUAL BRUISING OR BLEEDING  TENDERNESS IN MOUTH AND THROAT WITH OR WITHOUT PRESENCE OF ULCERS  *URINARY PROBLEMS  *BOWEL PROBLEMS  UNUSUAL RASH Items with * indicate a potential emergency and should be followed up as soon as possible.  Feel free to call the clinic should you have any questions or concerns. The clinic phone number is [phone].  Please show the CHEMO ALERT CARD at check-in to the Emergency Department and triage nurse.   

## 2018-09-14 ENCOUNTER — Other Ambulatory Visit: Payer: Self-pay | Admitting: Hematology and Oncology

## 2018-09-14 MED ORDER — LORAZEPAM 1 MG PO TABS: 2.0000 mg | ORAL_TABLET | Freq: Four times a day (QID) | ORAL | 3 refills | Status: DC | PRN

## 2018-09-14 MED ORDER — METOPROLOL TARTRATE 25 MG PO TABS: 25.0000 mg | ORAL_TABLET | Freq: Every day | ORAL | 1 refills | Status: DC

## 2018-09-15 ENCOUNTER — Encounter: Payer: Self-pay | Admitting: *Deleted

## 2018-09-16 NOTE — Progress Notes (Signed)
Patient Care Team: [name], NP as PCP - General (Nurse Practitioner)  DIAGNOSIS:    ICD-10-CM   1. Malignant neoplasm of upper-outer quadrant of right breast in female, estrogen receptor negative ([place]) C50.411 DISCONTINUED: PACLitaxel (TAXOL) 132 mg in sodium chloride 0.9 % 250 mL chemo infusion (</= 64m/m2)   Z17.1     SUMMARY OF ONCOLOGIC HISTORY:   Malignant ne[place] of upper-outer quadrant of right breast in [place], [place]   06/22/2018 Initial Diagnosis    Screening mammogram detected right breast mass 1.1 cm at 10 o'clock position right breast 12 cm from the nipple, no abnormal lymph nodes, biopsy of the mass revealed IDC with DCIS grade 3, ER 0%, PR 0%, HER-2 -1+ by IHC, Ki-67 40%, T1c N0 stage Ib    06/29/2018 Cancer Staging    Staging form: Breast, AJCC 8th Edition - Clinical stage from 06/29/2018: Stage IB (cT1c, cN0, cM0, G3, ER-, PR-, HER2-) - Signed by GNicholas Lose MD on 06/29/2018    07/09/2018 -  Neo-Adjuvant Chemotherapy     Neo- Adjuvant chemotherapy with dose dense Adriamycin and Cytoxan x4 followed by Taxol and carboplatin weekly x12     CHIEF COMPLIANT: Cycle 3 Taxol  INTERVAL HISTORY: Holly GASNERis a 43y.o. with above-mentioned history of triple negative right breast cancer who completed 4 cycles of neoadjuvant chemotherapy with dose dense Adriamycin and Cytoxan and is currently on weekly Taxol and Carboplatin every 3 weeks.An ECHO from 09/08/18 showed an ejection fraction in the range of 60-65%. She presents to the clinic todaywith hermom to decide if she will continue with treatment depsite severe side effects. Following treatment last week she could not tolerate metoprolol, even at half a dose, and had a weak and thready pulse and SOB in addition to fatigue. The past few days she has improved significantly in her energy levels and nausea has subsided and has been able to mow the lawn and walk more. She denies any tingling  or numbness in hands or feet and notes two toes that were previously numb have improved. Her labs from today show: WBC 3.7, Hg 9.9, platelets 127, ANC 1.4.   REVIEW OF SYSTEMS:   Constitutional: Denies fevers, chills or abnormal weight loss (+) fatigue Eyes: Denies blurriness of vision Ears, nose, mouth, throat, and face: Denies mucositis or sore throat Respiratory: Denies cough or wheezes (+) SOB Cardiovascular: Denies chest discomfort (+) palpitation (+) weak pulse Gastrointestinal: Denies nausea, heartburn or change in bowel habits Skin: Denies abnormal skin rashes Lymphatics: Denies new lymphadenopathy or easy bruising Neurological: Denies numbness, tingling or new weaknesses Behavioral/Psych: Mood is stable, no new changes  Extremities: No lower extremity edema Breast: denies any pain or lumps or nodules in either breasts All other systems were reviewed with the patient and are negative.  I have reviewed the past medical history, past surgical history, social history and family history with the patient and they are unchanged from previous note.  ALLERGIES:  is allergic to bee venom; dihydroergotamine; imitrex [sumatriptan]; metoclopramide; and transderm-scop [scopolamine].  MEDICATIONS:  Current Outpatient Medications  Medication Sig Dispense Refill   dexamethasone (DECADRON) 4 MG tablet Once daily on days 1 to 4 after each treatment 28 tablet 0   EPINEPHrine (EPIPEN) 0.3 mg/0.3 mL IJ SOAJ injection Inject 0.3 mg into the muscle once.      lidocaine-prilocaine (EMLA) cream Apply to affected area once 30 g 3   LORazepam (ATIVAN) 1 MG tablet Take 2 tablets (  2 mg total) by mouth every 6 (six) hours as needed for anxiety. Take 1 mg tablet every 6-8hrs as needed for nausea/vomiting. 90 tablet 3   magic mouthwash w/lidocaine SOLN Take 5 mLs by mouth 3 (three) times daily. 100 mL 0   OLANZapine zydis (ZYPREXA) 10 MG disintegrating tablet 10 mg on days 1 - 4 weekly after treatment 28  tablet 0   ondansetron (ZOFRAN) 8 MG tablet Take 1 tablet (8 mg total) by mouth 2 (two) times daily as needed. Start on the third day after chemotherapy. 30 tablet 1   prochlorperazine (COMPAZINE) 10 MG tablet Take 1 tablet (10 mg total) by mouth every 6 (six) hours as needed (Nausea or vomiting). 30 tablet 1   prochlorperazine (COMPAZINE) 25 MG suppository prochlorperazine 25 mg rectal suppository  INSERT 1 SUPPOSITORY RECTALLY TWICE A DAY AS NEEDED FOR NAUSEA     promethazine (PHENERGAN) 25 MG tablet Take 1 tablet (25 mg total) by mouth every 6 (six) hours as needed for nausea. 30 tablet 3   No current facility-administered medications for this visit.    Facility-Administered Medications Ordered in Other Visits  Medication Dose Route Frequency Provider Last Rate Last Dose   heparin lock flush 100 unit/mL  500 Units Intracatheter Once PRN Nicholas Lose, MD       PACLitaxel (TAXOL) 132 mg in sodium chloride 0.9 % 250 mL chemo infusion (</= 21m/m2)  65 mg/m2 (Treatment Plan Recorded) Intravenous Once GNicholas Lose MD 272 mL/hr at 09/17/18 1403 132 mg at 09/17/18 1403   sodium chloride flush (NS) 0.9 % injection 10 mL  10 mL Intracatheter PRN GNicholas Lose MD       sodium chloride flush (NS) 0.9 % injection 10 mL  10 mL Intracatheter PRN GNicholas Lose MD        PHYSICAL EXAMINATION: ECOG PERFORMANCE STATUS: 1 - Symptomatic but completely ambulatory  Vitals:   09/17/18 1140  BP: 108/70  Pulse: 80  Resp: 18  Temp: 98.7 F (37.1 C)  SpO2: 100%   Filed Weights   09/17/18 1140  Weight: 195 lb 3.2 oz (88.5 kg)    GENERAL: alert, no distress and comfortable SKIN: skin color, texture, turgor are normal, no rashes or significant lesions EYES: normal, Conjunctiva are pink and non-injected, sclera clear OROPHARYNX: no exudate, no erythema and lips, buccal mucosa, and tongue normal  NECK: supple, thyroid normal size, non-tender, without nodularity LYMPH: no palpable  lymphadenopathy in the cervical, axillary or inguinal LUNGS: clear to auscultation and percussion with normal breathing effort HEART: regular rate & rhythm and no murmurs and no lower extremity edema ABDOMEN: abdomen soft, non-tender and normal bowel sounds MUSCULOSKELETAL: no cyanosis of digits and no clubbing  NEURO: alert & oriented x 3 with fluent speech, no focal motor/sensory deficits EXTREMITIES: No lower extremity edema  LABORATORY DATA:  I have reviewed the data as listed CMP Latest Ref Rng & Units 09/17/2018 09/10/2018 09/08/2018  Glucose 70 - 99 mg/dL 92 81 107(H)  BUN 6 - 20 mg/dL '10 13 13  ' Creatinine 0.44 - 1.00 mg/dL 0.68 0.82 0.67  Sodium 135 - 145 mmol/L 140 140 137  Potassium 3.5 - 5.1 mmol/L 4.0 4.3 3.7  Chloride 98 - 111 mmol/L 106 106 103  CO2 22 - 32 mmol/L '27 26 27  ' Calcium 8.9 - 10.3 mg/dL 9.2 9.0 8.9  Total Protein 6.5 - 8.1 g/dL 6.5 6.4(L) 6.5  Total Bilirubin 0.3 - 1.2 mg/dL 0.4 0.4 0.6  Alkaline Phos 38 -  126 U/L 56 58 56  AST 15 - 41 U/L 32 13(L) 14(L)  ALT 0 - 44 U/L 58(H) 12 14    Lab Results  Component Value Date   WBC 3.7 (L) 09/17/2018   HGB 9.9 (L) 09/17/2018   HCT 29.6 (L) 09/17/2018   MCV 98.0 09/17/2018   PLT 127 (L) 09/17/2018   NEUTROABS 1.4 (L) 09/17/2018    ASSESSMENT & PLAN:  Malignant neoplasm of upper-outer quadrant of right breast in female, estrogen receptor negative ([place] [address]) 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%, T1c N0 stage Ib  Recommendation: 1.Neo-Adjuvant chemotherapy with dose dense Adriamycin and Cytoxan x4 followed by Taxol and carboplatinweekly x12 2.Patient prefers bilateral mastectomies plus or minus reconstruction Followed by  surveillance --------------------------------------------------------------------------------------------------------------------------------- MRI breast 07/04/2018: Known malignancy right UOQ 1.4 cm, upper central portion right breast 0.7 cm, LIQ left breast 0.7 cm Left breast biopsy 07/14/2018: Fibrocystic changes  Current treatment:Completed 4 cycles of dose dense Adriamycin and Cytoxan, today cycle 3 Taxol with carboplatin given every three weeks (carboplatin has been discontinued)  Chemo toxicities: 1. Nausea and vomiting: added fosaprepitant and Aloxi to her regimen, and phenergan as premed: Symptoms markedly improved 2.Mouth sores: Improved 3.Severe fatigue: Extreme fatigue, patient has noticed an improvement in her energy levels. 4.  Cytopenias: I will decrease the dosage of Taxol today.  We discussed different options including discontinuing further chemotherapy or obtaining a breast MRI to assess response.  After much discussion we decided to continue with the current treatment and I will see her back next week to determine whether she should continue with the neoadjuvant treatment.  No orders of the defined types were placed in this encounter.  The patient has a good understanding of the overall plan. she agrees with it. she will call with any problems that may develop before the next visit here.  Nicholas Lose, MD 09/17/2018  Holly Wiley am acting as scribe for Dr. Nicholas Lose.  I have reviewed the above documentation for accuracy and completeness, and I agree with the above.

## 2018-09-17 ENCOUNTER — Other Ambulatory Visit: Payer: Self-pay

## 2018-09-17 ENCOUNTER — Inpatient Hospital Stay: Payer: BLUE CROSS/BLUE SHIELD

## 2018-09-17 ENCOUNTER — Inpatient Hospital Stay (HOSPITAL_BASED_OUTPATIENT_CLINIC_OR_DEPARTMENT_OTHER): Payer: BLUE CROSS/BLUE SHIELD | Admitting: Hematology and Oncology

## 2018-09-17 DIAGNOSIS — Z171 Estrogen receptor negative status [ER-]: Secondary | ICD-10-CM | POA: Diagnosis not present

## 2018-09-17 DIAGNOSIS — C50411 Malignant neoplasm of upper-outer quadrant of right female breast: Secondary | ICD-10-CM

## 2018-09-17 DIAGNOSIS — Z79899 Other long term (current) drug therapy: Secondary | ICD-10-CM

## 2018-09-17 LAB — CBC WITH DIFFERENTIAL (CANCER CENTER ONLY)
Abs Immature Granulocytes: 0.02 10*3/uL (ref 0.00–0.07)
Basophils Absolute: 0 10*3/uL (ref 0.0–0.1)
Basophils Relative: 1 %
EOS ABS: 0.2 10*3/uL (ref 0.0–0.5)
Eosinophils Relative: 4 %
HCT: 29.6 % — ABNORMAL LOW (ref 36.0–46.0)
Hemoglobin: 9.9 g/dL — ABNORMAL LOW (ref 12.0–15.0)
IMMATURE GRANULOCYTES: 1 %
LYMPHS ABS: 1.6 10*3/uL (ref 0.7–4.0)
Lymphocytes Relative: 41 %
MCH: 32.8 pg (ref 26.0–34.0)
MCHC: 33.4 g/dL (ref 30.0–36.0)
MCV: 98 fL (ref 80.0–100.0)
Monocytes Absolute: 0.6 10*3/uL (ref 0.1–1.0)
Monocytes Relative: 17 %
Neutro Abs: 1.4 10*3/uL — ABNORMAL LOW (ref 1.7–7.7)
Neutrophils Relative %: 36 %
PLATELETS: 127 10*3/uL — AB (ref 150–400)
RBC: 3.02 MIL/uL — ABNORMAL LOW (ref 3.87–5.11)
RDW: 17.1 % — ABNORMAL HIGH (ref 11.5–15.5)
WBC Count: 3.7 10*3/uL — ABNORMAL LOW (ref 4.0–10.5)
nRBC: 0 % (ref 0.0–0.2)

## 2018-09-17 LAB — CMP (CANCER CENTER ONLY)
ALT: 58 U/L — ABNORMAL HIGH (ref 0–44)
AST: 32 U/L (ref 15–41)
Albumin: 3.7 g/dL (ref 3.5–5.0)
Alkaline Phosphatase: 56 U/L (ref 38–126)
Anion gap: 7 (ref 5–15)
BUN: 10 mg/dL (ref 6–20)
CO2: 27 mmol/L (ref 22–32)
Calcium: 9.2 mg/dL (ref 8.9–10.3)
Chloride: 106 mmol/L (ref 98–111)
Creatinine: 0.68 mg/dL (ref 0.44–1.00)
GFR, Est AFR Am: >60 mL/min (ref >60)
GFR, Estimated: >60 mL/min (ref >60)
Glucose, Bld: 92 mg/dL (ref 70–99)
Potassium: 4 mmol/L (ref 3.5–5.1)
SODIUM: 140 mmol/L (ref 135–145)
Total Bilirubin: 0.4 mg/dL (ref 0.3–1.2)
Total Protein: 6.5 g/dL (ref 6.5–8.1)

## 2018-09-17 MED ORDER — PALONOSETRON HCL INJECTION 0.25 MG/5ML
0.2500 mg | Freq: Once | INTRAVENOUS | Status: AC
  Administered 2018-09-17: 0.25 mg via INTRAVENOUS

## 2018-09-17 MED ORDER — SODIUM CHLORIDE 0.9% FLUSH
10.0000 mL | INTRAVENOUS | Status: DC | PRN
  Filled 2018-09-17: qty 10

## 2018-09-17 MED ORDER — HEPARIN SOD (PORK) LOCK FLUSH 100 UNIT/ML IV SOLN
500.0000 [IU] | Freq: Once | INTRAVENOUS | Status: AC | PRN
  Administered 2018-09-17: 500 [IU]
  Filled 2018-09-17: qty 5

## 2018-09-17 MED ORDER — SODIUM CHLORIDE 0.9 % IV SOLN
Freq: Once | INTRAVENOUS | Status: AC
  Administered 2018-09-17: 12:00:00 via INTRAVENOUS
  Filled 2018-09-17: qty 250

## 2018-09-17 MED ORDER — SODIUM CHLORIDE 0.9 % IV SOLN
65.0000 mg/m2 | Freq: Once | INTRAVENOUS | Status: AC
  Administered 2018-09-17: 132 mg via INTRAVENOUS
  Filled 2018-09-17: qty 22

## 2018-09-17 MED ORDER — DIPHENHYDRAMINE HCL 50 MG/ML IJ SOLN
25.0000 mg | Freq: Once | INTRAMUSCULAR | Status: AC
  Administered 2018-09-17: 25 mg via INTRAVENOUS

## 2018-09-17 MED ORDER — SODIUM CHLORIDE 0.9% FLUSH
10.0000 mL | INTRAVENOUS | Status: DC | PRN
  Administered 2018-09-17: 10 mL
  Filled 2018-09-17: qty 10

## 2018-09-17 MED ORDER — PALONOSETRON HCL INJECTION 0.25 MG/5ML
INTRAVENOUS | Status: AC
  Filled 2018-09-17: qty 5

## 2018-09-17 MED ORDER — SODIUM CHLORIDE 0.9 % IV SOLN
Freq: Once | INTRAVENOUS | Status: AC
  Administered 2018-09-17: 13:00:00 via INTRAVENOUS
  Filled 2018-09-17: qty 5

## 2018-09-17 MED ORDER — SODIUM CHLORIDE 0.9 % IV SOLN
20.0000 mg | Freq: Once | INTRAVENOUS | Status: AC
  Administered 2018-09-17: 20 mg via INTRAVENOUS
  Filled 2018-09-17: qty 2

## 2018-09-17 MED ORDER — DIPHENHYDRAMINE HCL 50 MG/ML IJ SOLN
INTRAMUSCULAR | Status: AC
  Filled 2018-09-17: qty 1

## 2018-09-17 MED ORDER — PROMETHAZINE HCL 25 MG/ML IJ SOLN
INTRAMUSCULAR | Status: AC
  Filled 2018-09-17: qty 1

## 2018-09-17 MED ORDER — PROMETHAZINE HCL 25 MG/ML IJ SOLN
12.5000 mg | Freq: Once | INTRAMUSCULAR | Status: AC
  Administered 2018-09-17: 12.5 mg via INTRAVENOUS

## 2018-09-17 NOTE — Assessment & Plan Note (Signed)
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%, T1c N0 stage Ib  Recommendation: 1.Neo-Adjuvant chemotherapy with dose dense Adriamycin and Cytoxan x4 followed by Taxol and carboplatinweekly x12 2.Patient prefers bilateral mastectomies plus or minus reconstruction Followed by surveillance --------------------------------------------------------------------------------------------------------------------------------- MRI breast 07/04/2018: Known malignancy right UOQ 1.4 cm, upper central portion right breast 0.7 cm, LIQ left breast 0.7 cm Left breast biopsy 07/14/2018: Fibrocystic changes  Current treatment:Completed 4 cycles of dose dense Adriamycin and Cytoxan, today cycle 3 Taxol with carboplatin given every three weeks (carboplatin has been discontinued)  Chemo toxicities: 1. Nausea and vomiting: added fosaprepitant and Aloxi to her regimen, and phenergan as premed: In spite of that she continues to have symptoms 2.Mouth sores: Improved 3.Severe fatigue: Extreme fatigue  We discussed different options including discontinuing further chemotherapy or obtaining a breast MRI to assess response.

## 2018-09-17 NOTE — Progress Notes (Signed)
OK to treat with ANC 1.4 per May Armel, RN per MD Lindi Adie

## 2018-09-17 NOTE — Patient Instructions (Signed)
[facility] Discharge Instructions for Patients Receiving Chemotherapy  Today you received the following chemotherapy agents:  Taxol.  To help prevent nausea and vomiting after your treatment, we encourage you to take your nausea medication as directed.   If you develop nausea and vomiting that is not controlled by your nausea medication, call the clinic.   BELOW ARE SYMPTOMS THAT SHOULD BE REPORTED IMMEDIATELY:  *FEVER GREATER THAN 100.5 F  *CHILLS WITH OR WITHOUT FEVER  NAUSEA AND VOMITING THAT IS NOT CONTROLLED WITH YOUR NAUSEA MEDICATION  *UNUSUAL SHORTNESS OF BREATH  *UNUSUAL BRUISING OR BLEEDING  TENDERNESS IN MOUTH AND THROAT WITH OR WITHOUT PRESENCE OF ULCERS  *URINARY PROBLEMS  *BOWEL PROBLEMS  UNUSUAL RASH Items with * indicate a potential emergency and should be followed up as soon as possible.  Feel free to call the clinic should you have any questions or concerns. The clinic phone number is [phone].  Please show the CHEMO ALERT CARD at check-in to the Emergency Department and triage nurse.   

## 2018-09-18 ENCOUNTER — Telehealth: Payer: Self-pay | Admitting: General Practice

## 2018-09-18 NOTE — Telephone Encounter (Signed)
[place] CSW Progress Notes  Call from patient, concerned about her short term disability paperwork which she needs to have submitted to employer today.  Determined that [name] is handling FMLA for [facility] [employer] at this time - called patient back and she had already had call from [name].  Paperwork has been processed and submitted as needed.  Patient appreciative.  [name], LCSW Clinical Social Worker Phone:  [phone]

## 2018-09-21 ENCOUNTER — Other Ambulatory Visit: Payer: Self-pay | Admitting: Hematology and Oncology

## 2018-09-23 NOTE — Progress Notes (Signed)
Patient Care Team: [name], NP as PCP - General (Nurse Practitioner)  DIAGNOSIS:    ICD-10-CM   1. Malignant neoplasm of upper-outer quadrant of right breast in female, estrogen receptor negative ([place]) C50.411    Z17.1     SUMMARY OF ONCOLOGIC HISTORY:   Malignant neoplasm of upper-outer quadrant of right breast in female, estrogen receptor negative ([place])   06/22/2018 Initial Diagnosis    Screening mammogram detected right breast mass 1.1 cm at 10 o'clock position right breast 12 cm from the nipple, no abnormal lymph nodes, biopsy of the mass revealed IDC with DCIS grade 3, ER 0%, PR 0%, HER-2 -1+ by IHC, Ki-67 40%, T1c N0 stage Ib    06/29/2018 Cancer Staging    Staging form: Breast, AJCC 8th Edition - Clinical stage from 06/29/2018: Stage IB (cT1c, cN0, cM0, G3, ER-, PR-, HER2-) - Signed by Nicholas Lose, MD on 06/29/2018    07/09/2018 -  Neo-Adjuvant Chemotherapy     Neo- Adjuvant chemotherapy with dose dense Adriamycin and Cytoxan x4 followed by Taxol and carboplatin weekly x12     CHIEF COMPLIANT: Cycle 4 Taxol  INTERVAL HISTORY: Holly Wiley is a 43 y.o. with above-mentioned history of triple negative right breast cancer who completed 4 cycles of neoadjuvant chemotherapy with dose dense Adriamycin and Cytoxan and is currently on weekly Taxol and Carboplatin every 3 weeks. She presents to the clinic alone today and tolerated her last treatment moderately well. She reports fatigue and tachycardia but denies vomiting or numbness and tingling in hands and feet. She takes Ativan nightly because she is having difficulty sleeping. Phenergan has been very helpful for her nausea. She has been trying to walk down her long driveway more often and get in more physical exercise and hydrate with water and gatorade. Her labs from today show: WBC 3.9, Hg 10.7, platelets 143, ANC 2.1.   REVIEW OF SYSTEMS:   Constitutional: Denies fevers, chills or abnormal weight loss (+) fatigue   Eyes: Denies blurriness of vision Ears, nose, mouth, throat, and face: Denies mucositis or sore throat Respiratory: Denies cough, dyspnea or wheezes Cardiovascular: Denies chest discomfort (+) tachycardia Gastrointestinal: Denies heartburn or change in bowel habits (+) nausea Skin: Denies abnormal skin rashes Lymphatics: Denies new lymphadenopathy or easy bruising Neurological: Denies numbness, tingling or new weaknesses Behavioral/Psych: Mood is stable, no new changes  (+) difficulty sleeping Extremities: No lower extremity edema Breast: denies any pain or lumps or nodules in either breasts All other systems were reviewed with the patient and are negative.  I have reviewed the past medical history, past surgical history, social history and family history with the patient and they are unchanged from previous note.  ALLERGIES:  is allergic to bee venom; dihydroergotamine; imitrex [sumatriptan]; metoclopramide; and transderm-scop [scopolamine].  MEDICATIONS:  Current Outpatient Medications  Medication Sig Dispense Refill  . dexamethasone (DECADRON) 4 MG tablet Once daily on days 1 to 4 after each treatment 28 tablet 0  . EPINEPHrine (EPIPEN) 0.3 mg/0.3 mL IJ SOAJ injection Inject 0.3 mg into the muscle once.     . lidocaine-prilocaine (EMLA) cream Apply to affected area once 30 g 3  . LORazepam (ATIVAN) 1 MG tablet Take 2 tablets (2 mg total) by mouth every 6 (six) hours as needed for anxiety. Take 1 mg tablet every 6-8hrs as needed for nausea/vomiting. 90 tablet 3  . magic mouthwash w/lidocaine SOLN Take 5 mLs by mouth 3 (three) times daily. 100 mL 0  . OLANZapine  zydis (ZYPREXA) 10 MG disintegrating tablet 10 mg on days 1 - 4 weekly after treatment 28 tablet 0  . ondansetron (ZOFRAN) 8 MG tablet Take 1 tablet (8 mg total) by mouth 2 (two) times daily as needed. Start on the third day after chemotherapy. 30 tablet 1  . prochlorperazine (COMPAZINE) 10 MG tablet Take 1 tablet (10 mg total)  by mouth every 6 (six) hours as needed (Nausea or vomiting). 30 tablet 1  . prochlorperazine (COMPAZINE) 25 MG suppository prochlorperazine 25 mg rectal suppository  INSERT 1 SUPPOSITORY RECTALLY TWICE A DAY AS NEEDED FOR NAUSEA    . promethazine (PHENERGAN) 25 MG tablet Take 1 tablet (25 mg total) by mouth every 6 (six) hours as needed for nausea. 30 tablet 3   No current facility-administered medications for this visit.    Facility-Administered Medications Ordered in Other Visits  Medication Dose Route Frequency Provider Last Rate Last Dose  . sodium chloride flush (NS) 0.9 % injection 10 mL  10 mL Intracatheter PRN Nicholas Lose, MD        PHYSICAL EXAMINATION: ECOG PERFORMANCE STATUS: 1 - Symptomatic but completely ambulatory  Vitals:   09/24/18 0846  BP: 119/82  Pulse: 94  Resp: 16  Temp: 97.8 F (36.6 C)  SpO2: 99%   Filed Weights   09/24/18 0846  Weight: 199 lb (90.3 kg)    GENERAL: alert, no distress and comfortable SKIN: skin color, texture, turgor are normal, no rashes or significant lesions EYES: normal, Conjunctiva are pink and non-injected, sclera clear OROPHARYNX: no exudate, no erythema and lips, buccal mucosa, and tongue normal  NECK: supple, thyroid normal size, non-tender, without nodularity LYMPH: no palpable lymphadenopathy in the cervical, axillary or inguinal LUNGS: clear to auscultation and percussion with normal breathing effort HEART: regular rate & rhythm and no murmurs and no lower extremity edema ABDOMEN: abdomen soft, non-tender and normal bowel sounds MUSCULOSKELETAL: no cyanosis of digits and no clubbing  NEURO: alert & oriented x 3 with fluent speech, no focal motor/sensory deficits EXTREMITIES: No lower extremity edema  LABORATORY DATA:  I have reviewed the data as listed CMP Latest Ref Rng & Units 09/24/2018 09/17/2018 09/10/2018  Glucose 70 - 99 mg/dL 80 92 81  BUN 6 - 20 mg/dL '15 10 13  ' Creatinine 0.44 - 1.00 mg/dL 0.69 0.68 0.82   Sodium 135 - 145 mmol/L 142 140 140  Potassium 3.5 - 5.1 mmol/L 3.9 4.0 4.3  Chloride 98 - 111 mmol/L 105 106 106  CO2 22 - 32 mmol/L '27 27 26  ' Calcium 8.9 - 10.3 mg/dL 9.0 9.2 9.0  Total Protein 6.5 - 8.1 g/dL 6.6 6.5 6.4(L)  Total Bilirubin 0.3 - 1.2 mg/dL 0.3 0.4 0.4  Alkaline Phos 38 - 126 U/L 68 56 58  AST 15 - 41 U/L 23 32 13(L)  ALT 0 - 44 U/L 42 58(H) 12    Lab Results  Component Value Date   WBC 3.9 (L) 09/24/2018   HGB 10.7 (L) 09/24/2018   HCT 31.8 (L) 09/24/2018   MCV 99.7 09/24/2018   PLT 143 (L) 09/24/2018   NEUTROABS 2.1 09/24/2018    ASSESSMENT & PLAN:  Malignant neoplasm of upper-outer quadrant of right breast in female, estrogen receptor negative ([place]) 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%,  T1c N0 stage Ib  Recommendation: 1.Neo-Adjuvant chemotherapy with dose dense Adriamycin and Cytoxan x4 followed by Taxol and carboplatinweekly x12 2.Patient prefers bilateral mastectomies plus or minus reconstruction Followed by surveillance --------------------------------------------------------------------------------------------------------------------------------- MRI breast 07/04/2018: Known malignancy right UOQ 1.4 cm, upper central portion right breast 0.7 cm, LIQ left breast 0.7 cm Left breast biopsy 07/14/2018: Fibrocystic changes  Current treatment:Completed 4 cycles of dose dense Adriamycin and Cytoxan, today cycle 4Taxol with carboplatin given every three weeks (carboplatin has been discontinued)  Chemo toxicities: 1.Nausea and vomiting: added fosaprepitant and Aloxi to her regimen, and phenergan as premed: Symptoms markedly improved 2.Mouth sores: Improved 3.Severe fatigue: Extreme fatigue, patient has noticed an improvement in her energy levels.  4.  Cytopenias:  We reduced the dosage of Taxol, WBC count and hemoglobin have improved today.  Return to clinic weekly for chemo and every other week follow-up with me.    No orders of the defined types were placed in this encounter.  The patient has a good understanding of the overall plan. she agrees with it. she will call with any problems that may develop before the next visit here.  Nicholas Lose, MD 09/24/2018  Julious Oka Dorshimer am acting as scribe for Dr. Nicholas Lose.  I have reviewed the above documentation for accuracy and completeness, and I agree with the above.

## 2018-09-24 ENCOUNTER — Inpatient Hospital Stay: Payer: BLUE CROSS/BLUE SHIELD

## 2018-09-24 ENCOUNTER — Other Ambulatory Visit: Payer: Self-pay

## 2018-09-24 ENCOUNTER — Inpatient Hospital Stay (HOSPITAL_BASED_OUTPATIENT_CLINIC_OR_DEPARTMENT_OTHER): Payer: BLUE CROSS/BLUE SHIELD | Admitting: Hematology and Oncology

## 2018-09-24 DIAGNOSIS — Z171 Estrogen receptor negative status [ER-]: Secondary | ICD-10-CM | POA: Diagnosis not present

## 2018-09-24 DIAGNOSIS — R112 Nausea with vomiting, unspecified: Secondary | ICD-10-CM

## 2018-09-24 DIAGNOSIS — C50411 Malignant neoplasm of upper-outer quadrant of right female breast: Secondary | ICD-10-CM

## 2018-09-24 DIAGNOSIS — Z95828 Presence of other vascular implants and grafts: Secondary | ICD-10-CM

## 2018-09-24 DIAGNOSIS — E860 Dehydration: Secondary | ICD-10-CM

## 2018-09-24 DIAGNOSIS — Z79899 Other long term (current) drug therapy: Secondary | ICD-10-CM

## 2018-09-24 DIAGNOSIS — R110 Nausea: Secondary | ICD-10-CM

## 2018-09-24 LAB — CBC WITH DIFFERENTIAL (CANCER CENTER ONLY)
Abs Immature Granulocytes: 0.02 10*3/uL (ref 0.00–0.07)
BASOS PCT: 1 %
Basophils Absolute: 0 10*3/uL (ref 0.0–0.1)
Eosinophils Absolute: 0.2 10*3/uL (ref 0.0–0.5)
Eosinophils Relative: 4 %
HCT: 31.8 % — ABNORMAL LOW (ref 36.0–46.0)
Hemoglobin: 10.7 g/dL — ABNORMAL LOW (ref 12.0–15.0)
Immature Granulocytes: 1 %
Lymphocytes Relative: 29 %
Lymphs Abs: 1.1 10*3/uL (ref 0.7–4.0)
MCH: 33.5 pg (ref 26.0–34.0)
MCHC: 33.6 g/dL (ref 30.0–36.0)
MCV: 99.7 fL (ref 80.0–100.0)
MONOS PCT: 11 %
Monocytes Absolute: 0.4 10*3/uL (ref 0.1–1.0)
Neutro Abs: 2.1 10*3/uL (ref 1.7–7.7)
Neutrophils Relative %: 54 %
Platelet Count: 143 10*3/uL — ABNORMAL LOW (ref 150–400)
RBC: 3.19 MIL/uL — ABNORMAL LOW (ref 3.87–5.11)
RDW: 18.3 % — ABNORMAL HIGH (ref 11.5–15.5)
WBC Count: 3.9 10*3/uL — ABNORMAL LOW (ref 4.0–10.5)
nRBC: 0 % (ref 0.0–0.2)

## 2018-09-24 LAB — CMP (CANCER CENTER ONLY)
ALT: 42 U/L (ref 0–44)
AST: 23 U/L (ref 15–41)
Albumin: 3.7 g/dL (ref 3.5–5.0)
Alkaline Phosphatase: 68 U/L (ref 38–126)
Anion gap: 10 (ref 5–15)
BUN: 15 mg/dL (ref 6–20)
CHLORIDE: 105 mmol/L (ref 98–111)
CO2: 27 mmol/L (ref 22–32)
CREATININE: 0.69 mg/dL (ref 0.44–1.00)
Calcium: 9 mg/dL (ref 8.9–10.3)
GFR, Est AFR Am: >60 mL/min (ref >60)
Glucose, Bld: 80 mg/dL (ref 70–99)
Potassium: 3.9 mmol/L (ref 3.5–5.1)
Sodium: 142 mmol/L (ref 135–145)
Total Bilirubin: 0.3 mg/dL (ref 0.3–1.2)
Total Protein: 6.6 g/dL (ref 6.5–8.1)

## 2018-09-24 MED ORDER — DIPHENHYDRAMINE HCL 50 MG/ML IJ SOLN
INTRAMUSCULAR | Status: AC
  Filled 2018-09-24: qty 1

## 2018-09-24 MED ORDER — GOSERELIN ACETATE 3.6 MG ~~LOC~~ IMPL
DRUG_IMPLANT | SUBCUTANEOUS | Status: AC
  Filled 2018-09-24: qty 3.6

## 2018-09-24 MED ORDER — HEPARIN SOD (PORK) LOCK FLUSH 100 UNIT/ML IV SOLN
500.0000 [IU] | Freq: Once | INTRAVENOUS | Status: AC | PRN
  Administered 2018-09-24: 500 [IU]
  Filled 2018-09-24: qty 5

## 2018-09-24 MED ORDER — FAMOTIDINE IN NACL 20-0.9 MG/50ML-% IV SOLN: 20.0000 mg | Freq: Once | INTRAVENOUS | Status: DC

## 2018-09-24 MED ORDER — PROMETHAZINE HCL 25 MG/ML IJ SOLN
12.5000 mg | Freq: Once | INTRAMUSCULAR | Status: AC
  Administered 2018-09-24: 12.5 mg via INTRAVENOUS

## 2018-09-24 MED ORDER — SODIUM CHLORIDE 0.9 % IV SOLN
20.0000 mg | Freq: Once | INTRAVENOUS | Status: AC
  Administered 2018-09-24: 20 mg via INTRAVENOUS
  Filled 2018-09-24: qty 2

## 2018-09-24 MED ORDER — DIPHENHYDRAMINE HCL 50 MG/ML IJ SOLN
25.0000 mg | Freq: Once | INTRAMUSCULAR | Status: AC
  Administered 2018-09-24: 25 mg via INTRAVENOUS

## 2018-09-24 MED ORDER — SODIUM CHLORIDE 0.9 % IV SOLN
600.0000 mg | Freq: Once | INTRAVENOUS | Status: AC
  Administered 2018-09-24: 600 mg via INTRAVENOUS
  Filled 2018-09-24: qty 60

## 2018-09-24 MED ORDER — PROMETHAZINE HCL 25 MG/ML IJ SOLN
INTRAMUSCULAR | Status: AC
  Filled 2018-09-24: qty 1

## 2018-09-24 MED ORDER — SODIUM CHLORIDE 0.9 % IV SOLN
Freq: Once | INTRAVENOUS | Status: AC
  Administered 2018-09-24: 10:00:00 via INTRAVENOUS
  Filled 2018-09-24: qty 250

## 2018-09-24 MED ORDER — GOSERELIN ACETATE 3.6 MG ~~LOC~~ IMPL
3.6000 mg | DRUG_IMPLANT | Freq: Once | SUBCUTANEOUS | Status: AC
  Administered 2018-09-24: 3.6 mg via SUBCUTANEOUS

## 2018-09-24 MED ORDER — PALONOSETRON HCL INJECTION 0.25 MG/5ML
0.2500 mg | Freq: Once | INTRAVENOUS | Status: AC
  Administered 2018-09-24: 0.25 mg via INTRAVENOUS

## 2018-09-24 MED ORDER — SODIUM CHLORIDE 0.9% FLUSH
10.0000 mL | INTRAVENOUS | Status: DC | PRN
  Administered 2018-09-24: 10 mL
  Filled 2018-09-24: qty 10

## 2018-09-24 MED ORDER — SODIUM CHLORIDE 0.9 % IV SOLN
65.0000 mg/m2 | Freq: Once | INTRAVENOUS | Status: AC
  Administered 2018-09-24: 132 mg via INTRAVENOUS
  Filled 2018-09-24: qty 22

## 2018-09-24 MED ORDER — ALTEPLASE 2 MG IJ SOLR
2.0000 mg | Freq: Once | INTRAMUSCULAR | Status: DC | PRN
  Filled 2018-09-24: qty 2

## 2018-09-24 MED ORDER — SODIUM CHLORIDE 0.9 % IV SOLN
Freq: Once | INTRAVENOUS | Status: AC
  Administered 2018-09-24: 11:00:00 via INTRAVENOUS
  Filled 2018-09-24: qty 5

## 2018-09-24 MED ORDER — PALONOSETRON HCL INJECTION 0.25 MG/5ML
INTRAVENOUS | Status: AC
  Filled 2018-09-24: qty 5

## 2018-09-24 NOTE — Addendum Note (Signed)
Addended by: Nicholas Lose on: 09/24/2018 09:47 AM   Modules accepted: Orders

## 2018-09-24 NOTE — Progress Notes (Signed)
Treatment plan showed Taxol only. Pt requested to continue Taxol and Carboplatin. Reviewed with MD, carboplatin added to pt treatment plan.

## 2018-09-24 NOTE — Patient Instructions (Signed)
[place] [facility] Discharge Instructions for Patients Receiving Chemotherapy  Today you received the following chemotherapy agents :  Taxol, Carboplatin.  To help prevent nausea and vomiting after your treatment, we encourage you to take your nausea medication as prescribed and as instructed by Dr. Lindi Adie.   If you develop nausea and vomiting that is not controlled by your nausea medication, call the clinic.   BELOW ARE SYMPTOMS THAT SHOULD BE REPORTED IMMEDIATELY:  *FEVER GREATER THAN 100.5 F  *CHILLS WITH OR WITHOUT FEVER  NAUSEA AND VOMITING THAT IS NOT CONTROLLED WITH YOUR NAUSEA MEDICATION  *UNUSUAL SHORTNESS OF BREATH  *UNUSUAL BRUISING OR BLEEDING  TENDERNESS IN MOUTH AND THROAT WITH OR WITHOUT PRESENCE OF ULCERS  *URINARY PROBLEMS  *BOWEL PROBLEMS  UNUSUAL RASH Items with * indicate a potential emergency and should be followed up as soon as possible.  Feel free to call the clinic should you have any questions or concerns. The clinic phone number is [phone].  Please show the [place] at check-in to the Emergency Department and triage nurse.

## 2018-09-24 NOTE — Assessment & Plan Note (Signed)
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%, T1c N0 stage Ib  Recommendation: 1.Neo-Adjuvant chemotherapy with dose dense Adriamycin and Cytoxan x4 followed by Taxol and carboplatinweekly x12 2.Patient prefers bilateral mastectomies plus or minus reconstruction Followed by surveillance --------------------------------------------------------------------------------------------------------------------------------- MRI breast 07/04/2018: Known malignancy right UOQ 1.4 cm, upper central portion right breast 0.7 cm, LIQ left breast 0.7 cm Left breast biopsy 07/14/2018: Fibrocystic changes  Current treatment:Completed 4 cycles of dose dense Adriamycin and Cytoxan, today cycle 4Taxol with carboplatin given every three weeks (carboplatin has been discontinued)  Chemo toxicities: 1.Nausea and vomiting: added fosaprepitant and Aloxi to her regimen, and phenergan as premed: Symptoms markedly improved 2.Mouth sores: Improved 3.Severe fatigue: Extreme fatigue, patient has noticed an improvement in her energy levels. 4.  Cytopenias: We reduced the dosage of Taxol  Return to clinic weekly for chemo and every other week follow-up with me.

## 2018-09-28 ENCOUNTER — Encounter: Payer: Self-pay | Admitting: Adult Health

## 2018-09-28 ENCOUNTER — Encounter: Payer: Self-pay | Admitting: Hematology and Oncology

## 2018-09-30 ENCOUNTER — Encounter: Payer: Self-pay | Admitting: Pharmacy Technician

## 2018-09-30 ENCOUNTER — Other Ambulatory Visit: Payer: Self-pay

## 2018-09-30 DIAGNOSIS — Z171 Estrogen receptor negative status [ER-]: Principal | ICD-10-CM

## 2018-09-30 DIAGNOSIS — C50411 Malignant neoplasm of upper-outer quadrant of right female breast: Secondary | ICD-10-CM

## 2018-09-30 NOTE — Progress Notes (Signed)
The patient is approved for drug assistance by [employer] for Faslode[employer]. Enrollment is effective until 09/29/19 and is based on insurance denial. The appeal process for insurance denial is in process.  Drug replacement will begin for DOS 10/18/18.

## 2018-10-01 ENCOUNTER — Inpatient Hospital Stay (HOSPITAL_BASED_OUTPATIENT_CLINIC_OR_DEPARTMENT_OTHER): Payer: BLUE CROSS/BLUE SHIELD | Admitting: Hematology and Oncology

## 2018-10-01 ENCOUNTER — Inpatient Hospital Stay: Payer: BLUE CROSS/BLUE SHIELD

## 2018-10-01 ENCOUNTER — Inpatient Hospital Stay: Payer: BLUE CROSS/BLUE SHIELD | Attending: Hematology and Oncology

## 2018-10-01 ENCOUNTER — Inpatient Hospital Stay: Payer: BLUE CROSS/BLUE SHIELD | Admitting: Nutrition

## 2018-10-01 ENCOUNTER — Other Ambulatory Visit: Payer: BLUE CROSS/BLUE SHIELD

## 2018-10-01 ENCOUNTER — Ambulatory Visit: Payer: BLUE CROSS/BLUE SHIELD | Admitting: Hematology and Oncology

## 2018-10-01 ENCOUNTER — Other Ambulatory Visit: Payer: Self-pay

## 2018-10-01 ENCOUNTER — Ambulatory Visit: Payer: BLUE CROSS/BLUE SHIELD

## 2018-10-01 VITALS — BP 110/75 | HR 96 | Temp 98.0°F | Resp 16 | Wt 199.6 lb

## 2018-10-01 DIAGNOSIS — Z20828 Contact with and (suspected) exposure to other viral communicable diseases: Secondary | ICD-10-CM | POA: Insufficient documentation

## 2018-10-01 DIAGNOSIS — Z171 Estrogen receptor negative status [ER-]: Secondary | ICD-10-CM | POA: Diagnosis not present

## 2018-10-01 DIAGNOSIS — Z79899 Other long term (current) drug therapy: Secondary | ICD-10-CM

## 2018-10-01 DIAGNOSIS — J22 Unspecified acute lower respiratory infection: Secondary | ICD-10-CM

## 2018-10-01 DIAGNOSIS — R112 Nausea with vomiting, unspecified: Secondary | ICD-10-CM

## 2018-10-01 DIAGNOSIS — C50411 Malignant neoplasm of upper-outer quadrant of right female breast: Secondary | ICD-10-CM

## 2018-10-01 DIAGNOSIS — Z5111 Encounter for antineoplastic chemotherapy: Secondary | ICD-10-CM | POA: Diagnosis not present

## 2018-10-01 DIAGNOSIS — Z95828 Presence of other vascular implants and grafts: Secondary | ICD-10-CM

## 2018-10-01 DIAGNOSIS — B9729 Other coronavirus as the cause of diseases classified elsewhere: Secondary | ICD-10-CM | POA: Diagnosis not present

## 2018-10-01 DIAGNOSIS — E860 Dehydration: Secondary | ICD-10-CM

## 2018-10-01 DIAGNOSIS — D649 Anemia, unspecified: Secondary | ICD-10-CM

## 2018-10-01 DIAGNOSIS — R110 Nausea: Secondary | ICD-10-CM

## 2018-10-01 LAB — CBC WITH DIFFERENTIAL (CANCER CENTER ONLY)
Abs Immature Granulocytes: 0.01 10*3/uL (ref 0.00–0.07)
Basophils Absolute: 0 10*3/uL (ref 0.0–0.1)
Basophils Relative: 1 %
Eosinophils Absolute: 0.1 10*3/uL (ref 0.0–0.5)
Eosinophils Relative: 4 %
HCT: 28.3 % — ABNORMAL LOW (ref 36.0–46.0)
Hemoglobin: 9.5 g/dL — ABNORMAL LOW (ref 12.0–15.0)
Immature Granulocytes: 0 %
Lymphocytes Relative: 38 %
Lymphs Abs: 1.2 10*3/uL (ref 0.7–4.0)
MCH: 34.1 pg — ABNORMAL HIGH (ref 26.0–34.0)
MCHC: 33.6 g/dL (ref 30.0–36.0)
MCV: 101.4 fL — ABNORMAL HIGH (ref 80.0–100.0)
Monocytes Absolute: 0.2 10*3/uL (ref 0.1–1.0)
Monocytes Relative: 7 %
Neutro Abs: 1.5 10*3/uL — ABNORMAL LOW (ref 1.7–7.7)
Neutrophils Relative %: 50 %
Platelet Count: 138 10*3/uL — ABNORMAL LOW (ref 150–400)
RBC: 2.79 MIL/uL — ABNORMAL LOW (ref 3.87–5.11)
RDW: 17.2 % — ABNORMAL HIGH (ref 11.5–15.5)
WBC Count: 3 10*3/uL — ABNORMAL LOW (ref 4.0–10.5)
nRBC: 0 % (ref 0.0–0.2)

## 2018-10-01 LAB — CMP (CANCER CENTER ONLY)
ALT: 40 U/L (ref 0–44)
AST: 25 U/L (ref 15–41)
Albumin: 3.7 g/dL (ref 3.5–5.0)
Alkaline Phosphatase: 64 U/L (ref 38–126)
Anion gap: 7 (ref 5–15)
BUN: 16 mg/dL (ref 6–20)
CO2: 27 mmol/L (ref 22–32)
Calcium: 8.9 mg/dL (ref 8.9–10.3)
Chloride: 105 mmol/L (ref 98–111)
Creatinine: 0.69 mg/dL (ref 0.44–1.00)
GFR, Est AFR Am: >60 mL/min (ref >60)
GFR, Estimated: >60 mL/min (ref >60)
Glucose, Bld: 86 mg/dL (ref 70–99)
Potassium: 3.5 mmol/L (ref 3.5–5.1)
Sodium: 139 mmol/L (ref 135–145)
Total Bilirubin: 0.4 mg/dL (ref 0.3–1.2)
Total Protein: 6.4 g/dL — ABNORMAL LOW (ref 6.5–8.1)

## 2018-10-01 MED ORDER — DIPHENHYDRAMINE HCL 50 MG/ML IJ SOLN
25.0000 mg | Freq: Once | INTRAMUSCULAR | Status: AC
  Administered 2018-10-01: 13:00:00 25 mg via INTRAVENOUS

## 2018-10-01 MED ORDER — PROMETHAZINE HCL 25 MG/ML IJ SOLN
12.5000 mg | Freq: Once | INTRAMUSCULAR | Status: AC
  Administered 2018-10-01: 13:00:00 12.5 mg via INTRAVENOUS

## 2018-10-01 MED ORDER — SODIUM CHLORIDE 0.9% FLUSH
10.0000 mL | INTRAVENOUS | Status: DC | PRN
  Administered 2018-10-01: 16:00:00 10 mL
  Filled 2018-10-01: qty 10

## 2018-10-01 MED ORDER — SODIUM CHLORIDE 0.9 % IV SOLN
65.0000 mg/m2 | Freq: Once | INTRAVENOUS | Status: AC
  Administered 2018-10-01: 15:00:00 132 mg via INTRAVENOUS
  Filled 2018-10-01: qty 22

## 2018-10-01 MED ORDER — HEPARIN SOD (PORK) LOCK FLUSH 100 UNIT/ML IV SOLN
500.0000 [IU] | Freq: Once | INTRAVENOUS | Status: AC | PRN
  Administered 2018-10-01: 500 [IU]
  Filled 2018-10-01: qty 5

## 2018-10-01 MED ORDER — DIPHENHYDRAMINE HCL 50 MG/ML IJ SOLN
INTRAMUSCULAR | Status: AC
  Filled 2018-10-01: qty 1

## 2018-10-01 MED ORDER — PROMETHAZINE HCL 25 MG/ML IJ SOLN
INTRAMUSCULAR | Status: AC
  Filled 2018-10-01: qty 1

## 2018-10-01 MED ORDER — SODIUM CHLORIDE 0.9 % IV SOLN
Freq: Once | INTRAVENOUS | Status: AC
  Administered 2018-10-01: 14:00:00 via INTRAVENOUS
  Filled 2018-10-01: qty 5

## 2018-10-01 MED ORDER — PALONOSETRON HCL INJECTION 0.25 MG/5ML
INTRAVENOUS | Status: AC
  Filled 2018-10-01: qty 5

## 2018-10-01 MED ORDER — PALONOSETRON HCL INJECTION 0.25 MG/5ML
0.2500 mg | Freq: Once | INTRAVENOUS | Status: AC
  Administered 2018-10-01: 13:00:00 0.25 mg via INTRAVENOUS

## 2018-10-01 MED ORDER — SODIUM CHLORIDE 0.9 % IV SOLN
Freq: Once | INTRAVENOUS | Status: AC
  Administered 2018-10-01: 13:00:00 via INTRAVENOUS
  Filled 2018-10-01: qty 250

## 2018-10-01 MED ORDER — CYANOCOBALAMIN 1000 MCG/ML IJ SOLN
INTRAMUSCULAR | Status: AC
  Filled 2018-10-01: qty 1

## 2018-10-01 MED ORDER — SODIUM CHLORIDE 0.9 % IV SOLN
20.0000 mg | Freq: Once | INTRAVENOUS | Status: AC
  Administered 2018-10-01: 20 mg via INTRAVENOUS
  Filled 2018-10-01: qty 2

## 2018-10-01 MED ORDER — CYANOCOBALAMIN 1000 MCG/ML IJ SOLN
1000.0000 ug | Freq: Once | INTRAMUSCULAR | Status: AC
  Administered 2018-10-01: 1000 ug via INTRAMUSCULAR

## 2018-10-01 NOTE — Progress Notes (Signed)
Patient Care Team: [name], NP as PCP - General (Nurse Practitioner)  DIAGNOSIS:  Encounter Diagnoses  Name Primary?  . Fatigue associated with anemia Yes  . Malignant neoplasm of upper-outer quadrant of right breast in female, estrogen receptor negative ([place])     SUMMARY OF ONCOLOGIC HISTORY:   Malignant neoplasm of upper-outer quadrant of right breast in female, estrogen receptor negative ([place])   06/22/2018 Initial Diagnosis    Screening mammogram detected right breast mass 1.1 cm at 10 o'clock position right breast 12 cm from the nipple, no abnormal lymph nodes, biopsy of the mass revealed IDC with DCIS grade 3, ER 0%, PR 0%, HER-2 -1+ by IHC, Ki-67 40%, T1c N0 stage Ib    06/29/2018 Cancer Staging    Staging form: Breast, AJCC 8th Edition - Clinical stage from 06/29/2018: Stage IB (cT1c, cN0, cM0, G3, ER-, PR-, HER2-) - Signed by Nicholas Lose, MD on 06/29/2018    07/09/2018 -  Neo-Adjuvant Chemotherapy     Neo- Adjuvant chemotherapy with dose dense Adriamycin and Cytoxan x4 followed by Taxol and carboplatin weekly x12     CHIEF COMPLIANT: Cycle 5 Taxol  INTERVAL HISTORY: Holly Wiley is a 43 year old above-mentioned history of right breast cancer currently neoadjuvant chemotherapy today cycle 5 of Taxol.  She received carboplatin and Taxol last week and has felt remarkably fatigued that lasted every day this past week.  She has to rest every few steps otherwise she is winded.  Denies any nausea or vomiting.  She is still in good spirits and wishes to stay the course.  She discussed last week about obtaining an MRI to assess response so far and determining whether to continue with the treatment plan.  We are obtaining an MRI on April 7.  Based on that we can make decisions on further chemo.  She denies any fevers or chills.  REVIEW OF SYSTEMS:   Constitutional: Denies fevers, chills or abnormal weight loss, severe fatigue Eyes: Denies blurriness of vision Ears, nose,  mouth, throat, and face: Denies mucositis or sore throat Respiratory: Denies cough, dyspnea or wheezes Cardiovascular: Denies palpitation, chest discomfort Gastrointestinal:  Denies nausea, heartburn or change in bowel habits Skin: Denies abnormal skin rashes Lymphatics: Denies new lymphadenopathy or easy bruising Neurological:Denies numbness, tingling or new weaknesses Behavioral/Psych: Mood is stable, no new changes  Extremities: No lower extremity edema Breast:  denies any pain or lumps or nodules in either breasts All other systems were reviewed with the patient and are negative.  I have reviewed the past medical history, past surgical history, social history and family history with the patient and they are unchanged from previous note.  ALLERGIES:  is allergic to bee venom; dihydroergotamine; imitrex [sumatriptan]; metoclopramide; and transderm-scop [scopolamine].  MEDICATIONS:  Current Outpatient Medications  Medication Sig Dispense Refill  . dexamethasone (DECADRON) 4 MG tablet Once daily on days 1 to 4 after each treatment 28 tablet 0  . EPINEPHrine (EPIPEN) 0.3 mg/0.3 mL IJ SOAJ injection Inject 0.3 mg into the muscle once.     . lidocaine-prilocaine (EMLA) cream Apply to affected area once 30 g 3  . LORazepam (ATIVAN) 1 MG tablet Take 2 tablets (2 mg total) by mouth every 6 (six) hours as needed for anxiety. Take 1 mg tablet every 6-8hrs as needed for nausea/vomiting. 90 tablet 3  . magic mouthwash w/lidocaine SOLN Take 5 mLs by mouth 3 (three) times daily. 100 mL 0  . OLANZapine zydis (ZYPREXA) 10 MG disintegrating tablet 10  mg on days 1 - 4 weekly after treatment 28 tablet 0  . ondansetron (ZOFRAN) 8 MG tablet Take 1 tablet (8 mg total) by mouth 2 (two) times daily as needed. Start on the third day after chemotherapy. 30 tablet 1  . prochlorperazine (COMPAZINE) 10 MG tablet Take 1 tablet (10 mg total) by mouth every 6 (six) hours as needed (Nausea or vomiting). 30 tablet 1  .  prochlorperazine (COMPAZINE) 25 MG suppository prochlorperazine 25 mg rectal suppository  INSERT 1 SUPPOSITORY RECTALLY TWICE A DAY AS NEEDED FOR NAUSEA    . promethazine (PHENERGAN) 25 MG tablet Take 1 tablet (25 mg total) by mouth every 6 (six) hours as needed for nausea. 30 tablet 3   No current facility-administered medications for this visit.    Facility-Administered Medications Ordered in Other Visits  Medication Dose Route Frequency Provider Last Rate Last Dose  . sodium chloride flush (NS) 0.9 % injection 10 mL  10 mL Intracatheter PRN Nicholas Lose, MD        PHYSICAL EXAMINATION: ECOG PERFORMANCE STATUS: 2 - Symptomatic, <50% confined to bed  Vitals:   10/01/18 1147  BP: 110/75  Pulse: 96  Resp: 16  Temp: 98 F (36.7 C)  SpO2: 99%   Filed Weights   10/01/18 1147  Weight: 199 lb 9 oz (90.5 kg)    GENERAL:alert, no distress and comfortable SKIN: skin color, texture, turgor are normal, no rashes or significant lesions EYES: normal, Conjunctiva are pink and non-injected, sclera clear OROPHARYNX:no exudate, no erythema and lips, buccal mucosa, and tongue normal  NECK: supple, thyroid normal size, non-tender, without nodularity LYMPH:  no palpable lymphadenopathy in the cervical, axillary or inguinal LUNGS: clear to auscultation and percussion with normal breathing effort HEART: regular rate & rhythm and no murmurs and no lower extremity edema ABDOMEN:abdomen soft, non-tender and normal bowel sounds MUSCULOSKELETAL:no cyanosis of digits and no clubbing  NEURO: alert & oriented x 3 with fluent speech, no focal motor/sensory deficits EXTREMITIES: No lower extremity edema   LABORATORY DATA:  I have reviewed the data as listed CMP Latest Ref Rng & Units 10/01/2018 09/24/2018 09/17/2018  Glucose 70 - 99 mg/dL 86 80 92  BUN 6 - 20 mg/dL '16 15 10  ' Creatinine 0.44 - 1.00 mg/dL 0.69 0.69 0.68  Sodium 135 - 145 mmol/L 139 142 140  Potassium 3.5 - 5.1 mmol/L 3.5 3.9 4.0   Chloride 98 - 111 mmol/L 105 105 106  CO2 22 - 32 mmol/L '27 27 27  ' Calcium 8.9 - 10.3 mg/dL 8.9 9.0 9.2  Total Protein 6.5 - 8.1 g/dL 6.4(L) 6.6 6.5  Total Bilirubin 0.3 - 1.2 mg/dL 0.4 0.3 0.4  Alkaline Phos 38 - 126 U/L 64 68 56  AST 15 - 41 U/L 25 23 32  ALT 0 - 44 U/L 40 42 58(H)    Lab Results  Component Value Date   WBC 3.0 (L) 10/01/2018   HGB 9.5 (L) 10/01/2018   HCT 28.3 (L) 10/01/2018   MCV 101.4 (H) 10/01/2018   PLT 138 (L) 10/01/2018   NEUTROABS 1.5 (L) 10/01/2018    ASSESSMENT & PLAN:  Malignant neoplasm of upper-outer quadrant of right breast in female, estrogen receptor negative ([place]) 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%, T1c N0 stage Ib  Recommendation: 1.Neo-Adjuvant chemotherapy with dose dense Adriamycin and Cytoxan x4 followed by Taxol and carboplatinweekly x12 2.Patient prefers bilateral mastectomies plus or minus reconstruction Followed by surveillance --------------------------------------------------------------------------------------------------------------------------------- MRI breast 07/04/2018: Known malignancy right UOQ 1.4 cm, upper central portion right breast 0.7 cm, LIQ left breast 0.7 cm Left breast biopsy 07/14/2018: Fibrocystic changes  Current treatment:Completed 4 cycles of dose dense Adriamycin and Cytoxan, today cycle 5Taxol with carboplatin given every three weeks  Chemo toxicities: 1.Nausea and vomiting: added fosaprepitant and Aloxi to her regimen, and phenergan as premed:Symptoms markedly improved 2.Mouth sores: Improved 3.Severe fatigue: Extreme fatigue, continues to be a major problem 4.Cytopenias:  We reduced the dosage of Taxol but patient received carboplatin last week and hence the blood counts are lower. 5.   Chemotherapy-induced anemia: Monitoring closely. We will check TSH and B12. I recommended giving B12 injections once a week.  Return to clinic weekly for chemo and every other week follow-up with me.    Orders Placed This Encounter  Procedures  . Thyroid Panel With TSH    Standing Status:   Future    Standing Expiration Date:   10/01/2019  . Vitamin B12    Standing Status:   Future    Standing Expiration Date:   11/05/2019   The patient has a good understanding of the overall plan. she agrees with it. she will call with any problems that may develop before the next visit here.   Harriette Ohara, MD 10/01/18

## 2018-10-01 NOTE — Patient Instructions (Signed)
[facility] Discharge Instructions for Patients Receiving Chemotherapy  Today you received the following chemotherapy agents Taxol   To help prevent nausea and vomiting after your treatment, we encourage you to take your nausea medication as directed.    If you develop nausea and vomiting that is not controlled by your nausea medication, call the clinic.   BELOW ARE SYMPTOMS THAT SHOULD BE REPORTED IMMEDIATELY:  *FEVER GREATER THAN 100.5 F  *CHILLS WITH OR WITHOUT FEVER  NAUSEA AND VOMITING THAT IS NOT CONTROLLED WITH YOUR NAUSEA MEDICATION  *UNUSUAL SHORTNESS OF BREATH  *UNUSUAL BRUISING OR BLEEDING  TENDERNESS IN MOUTH AND THROAT WITH OR WITHOUT PRESENCE OF ULCERS  *URINARY PROBLEMS  *BOWEL PROBLEMS  UNUSUAL RASH Items with * indicate a potential emergency and should be followed up as soon as possible.  Feel free to call the clinic should you have any questions or concerns. The clinic phone number is [phone].  Please show the [place] at check-in to the Emergency Department and triage nurse.Coronavirus (COVID-19) Are you at risk?  Are you at risk for the Coronavirus (COVID-19)?  To be considered HIGH RISK for Coronavirus (COVID-19), you have to meet the following criteria:  . Traveled to [place], [place], [place], [place] or [place]; or in the [place] to [place], [place], [place], or [place]; and have fever, cough, and shortness of breath within the last 2 weeks of travel OR . Been in close contact with a person diagnosed with COVID-19 within the last 2 weeks and have fever, cough, and shortness of breath . IF YOU DO NOT MEET THESE CRITERIA, YOU ARE CONSIDERED LOW RISK FOR COVID-19.  What to do if you are HIGH RISK for COVID-19?  Marland Kitchen If you are having a medical emergency, call 911. . Seek medical care right away. Before you go to a doctor's office, urgent care or emergency department, call ahead and tell them about your recent  travel, contact with someone diagnosed with COVID-19, and your symptoms. You should receive instructions from your physician's office regarding next steps of care.  . When you arrive at healthcare provider, tell the healthcare staff immediately you have returned from visiting [place], [place], [place], [place] or [place]; or traveled in the [place] to [place], [place], [place], or [place]; in the last two weeks or you have been in close contact with a person diagnosed with COVID-19 in the last 2 weeks.   . Tell the health care staff about your symptoms: fever, cough and shortness of breath. . After you have been seen by a medical provider, you will be either: o Tested for (COVID-19) and discharged home on quarantine except to seek medical care if symptoms worsen, and asked to  - Stay home and avoid contact with others until you get your results (4-5 days)  - Avoid travel on public transportation if possible (such as bus, train, or airplane) or o Sent to the Emergency Department by EMS for evaluation, COVID-19 testing, and possible admission depending on your condition and test results.  What to do if you are LOW RISK for COVID-19?  Reduce your risk of any infection by using the same precautions used for avoiding the common cold or flu:  Marland Kitchen Wash your hands often with soap and warm water for at least 20 seconds.  If soap and water are not readily available, use an alcohol-based hand sanitizer with at least 60% alcohol.  . If coughing or sneezing,  cover your mouth and nose by coughing or sneezing into the elbow areas of your shirt or coat, into a tissue or into your sleeve (not your hands). . Avoid shaking hands with others and consider head nods or verbal greetings only. . Avoid touching your eyes, nose, or mouth with unwashed hands.  . Avoid close contact with people who are sick. . Avoid places or events with large numbers of people in one location, like concerts or sporting  events. . Carefully consider travel plans you have or are making. . If you are planning any travel outside or inside the [place], visit the CDC's Travelers' Health webpage for the latest health notices. . If you have some symptoms but not all symptoms, continue to monitor at home and seek medical attention if your symptoms worsen. . If you are having a medical emergency, call 911.   [place] / e-Visit: [email or web address]         [facility]: [phone]  [facility]: [phone]                   [facility]: [phone]   Cyanocobalamin, Vitamin B12 injection What is this medicine? CYANOCOBALAMIN (sye an oh koe BAL a min) is a man made form of vitamin B12. Vitamin B12 is used in the growth of healthy blood cells, nerve cells, and proteins in the body. It also helps with the metabolism of fats and carbohydrates. This medicine is used to treat people who can not absorb vitamin B12. This medicine may be used for other purposes; ask your health care provider or pharmacist if you have questions. COMMON BRAND NAME(S): B-12 Compliance Kit, B-12 Injection Kit, Cyomin, LA-12, Nutri-Twelve, Physicians EZ Use B-12, Primabalt What should I tell my health care provider before I take this medicine? They need to know if you have any of these conditions: -kidney disease -Leber's disease -megaloblastic anemia -an unusual or allergic reaction to cyanocobalamin, cobalt, other medicines, foods, dyes, or preservatives -pregnant or trying to get pregnant -breast-feeding How should I use this medicine? This medicine is injected into a muscle or deeply under the skin. It is usually given by a health care professional in a clinic or doctor's office. However, your doctor may teach you how to inject yourself. Follow all instructions. Talk to your pediatrician regarding the  use of this medicine in children. Special care may be needed. Overdosage: If you think you have taken too much of this medicine contact a poison control center or emergency room at once. NOTE: This medicine is only for you. Do not share this medicine with others. What if I miss a dose? If you are given your dose at a clinic or doctor's office, call to reschedule your appointment. If you give your own injections and you miss a dose, take it as soon as you can. If it is almost time for your next dose, take only that dose. Do not take double or extra doses. What may interact with this medicine? -colchicine -heavy alcohol intake This list may not describe all possible interactions. Give your health care provider a list of all the medicines, herbs, non-prescription drugs, or dietary supplements you use. Also tell them if you smoke, drink alcohol, or use illegal drugs. Some items may interact with your medicine. What should I watch for while using this medicine? Visit your doctor or health care professional regularly. You may need blood work done while you  are taking this medicine. You may need to follow a special diet. Talk to your doctor. Limit your alcohol intake and avoid smoking to get the best benefit. What side effects may I notice from receiving this medicine? Side effects that you should report to your doctor or health care professional as soon as possible: -allergic reactions like skin rash, itching or hives, swelling of the face, lips, or tongue -blue tint to skin -chest tightness, pain -difficulty breathing, wheezing -dizziness -red, swollen painful area on the leg Side effects that usually do not require medical attention (report to your doctor or health care professional if they continue or are bothersome): -diarrhea -headache This list may not describe all possible side effects. Call your doctor for medical advice about side effects. You may report side effects to FDA at  1-800-FDA-1088. Where should I keep my medicine? Keep out of the reach of children. Store at room temperature between 15 and 30 degrees C (59 and 85 degrees F). Protect from light. Throw away any unused medicine after the expiration date. NOTE: This sheet is a summary. It may not cover all possible information. If you have questions about this medicine, talk to your doctor, pharmacist, or health care provider.  2019 Elsevier/Gold Standard (2007-09-28 22:10:20)

## 2018-10-01 NOTE — Assessment & Plan Note (Signed)
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%, T1c N0 stage Ib  Recommendation: 1.Neo-Adjuvant chemotherapy with dose dense Adriamycin and Cytoxan x4 followed by Taxol and carboplatinweekly x12 2.Patient prefers bilateral mastectomies plus or minus reconstruction Followed by surveillance --------------------------------------------------------------------------------------------------------------------------------- MRI breast 07/04/2018: Known malignancy right UOQ 1.4 cm, upper central portion right breast 0.7 cm, LIQ left breast 0.7 cm Left breast biopsy 07/14/2018: Fibrocystic changes  Current treatment:Completed 4 cycles of dose dense Adriamycin and Cytoxan, today cycle 5Taxol with carboplatin given every three weeks  Chemo toxicities: 1.Nausea and vomiting: added fosaprepitant and Aloxi to her regimen, and phenergan as premed:Symptoms markedly improved 2.Mouth sores: Improved 3.Severe fatigue: Extreme fatigue, continues to be a major problem 4.Cytopenias:  We reduced the dosage of Taxol but patient received carboplatin last week and hence the blood counts are lower. 5.  Chemotherapy-induced anemia: Monitoring closely. We will check TSH and B12. I recommended giving B12 injections once a week.  Return to clinic weekly for chemo and every other week follow-up with me.

## 2018-10-01 NOTE — Progress Notes (Signed)
RD working remotely.  Contacted patient for nutrition follow up. Reports she feels horrible but it is not related to nutrition. States she eats small, frequent meals and snacks.  Weight is stable. Weight documented as 199.5 pounds.  Albumin 3.7.  Food and Nutrition Related Knowledge Deficit resolved.  Encouraged patient to contact me with questions or concerns. She has my contact information.

## 2018-10-02 ENCOUNTER — Encounter: Payer: Self-pay | Admitting: Adult Health

## 2018-10-02 ENCOUNTER — Other Ambulatory Visit: Payer: Self-pay | Admitting: *Deleted

## 2018-10-02 ENCOUNTER — Other Ambulatory Visit: Payer: Self-pay

## 2018-10-02 ENCOUNTER — Inpatient Hospital Stay: Payer: BLUE CROSS/BLUE SHIELD

## 2018-10-02 ENCOUNTER — Inpatient Hospital Stay (HOSPITAL_BASED_OUTPATIENT_CLINIC_OR_DEPARTMENT_OTHER): Payer: BLUE CROSS/BLUE SHIELD | Admitting: Adult Health

## 2018-10-02 ENCOUNTER — Telehealth: Payer: Self-pay | Admitting: Emergency Medicine

## 2018-10-02 ENCOUNTER — Other Ambulatory Visit: Payer: Self-pay | Admitting: Emergency Medicine

## 2018-10-02 VITALS — BP 134/69 | HR 102 | Temp 98.6°F | Resp 18

## 2018-10-02 DIAGNOSIS — Z171 Estrogen receptor negative status [ER-]: Principal | ICD-10-CM

## 2018-10-02 DIAGNOSIS — R509 Fever, unspecified: Secondary | ICD-10-CM

## 2018-10-02 DIAGNOSIS — B9729 Other coronavirus as the cause of diseases classified elsewhere: Secondary | ICD-10-CM | POA: Diagnosis not present

## 2018-10-02 DIAGNOSIS — C50411 Malignant neoplasm of upper-outer quadrant of right female breast: Secondary | ICD-10-CM | POA: Diagnosis not present

## 2018-10-02 DIAGNOSIS — R110 Nausea: Secondary | ICD-10-CM

## 2018-10-02 DIAGNOSIS — J22 Unspecified acute lower respiratory infection: Secondary | ICD-10-CM

## 2018-10-02 DIAGNOSIS — Z95828 Presence of other vascular implants and grafts: Secondary | ICD-10-CM

## 2018-10-02 DIAGNOSIS — Z79899 Other long term (current) drug therapy: Secondary | ICD-10-CM

## 2018-10-02 DIAGNOSIS — E860 Dehydration: Secondary | ICD-10-CM

## 2018-10-02 DIAGNOSIS — R112 Nausea with vomiting, unspecified: Secondary | ICD-10-CM

## 2018-10-02 LAB — CMP (CANCER CENTER ONLY)
ALT: 36 U/L (ref 0–44)
AST: 17 U/L (ref 15–41)
Albumin: 3.5 g/dL (ref 3.5–5.0)
Alkaline Phosphatase: 59 U/L (ref 38–126)
Anion gap: 8 (ref 5–15)
BUN: 13 mg/dL (ref 6–20)
CO2: 23 mmol/L (ref 22–32)
Calcium: 8.5 mg/dL — ABNORMAL LOW (ref 8.9–10.3)
Chloride: 109 mmol/L (ref 98–111)
Creatinine: 0.7 mg/dL (ref 0.44–1.00)
GFR, Est AFR Am: >60 mL/min (ref >60)
GFR, Estimated: >60 mL/min (ref >60)
Glucose, Bld: 123 mg/dL — ABNORMAL HIGH (ref 70–99)
Potassium: 3.3 mmol/L — ABNORMAL LOW (ref 3.5–5.1)
Sodium: 140 mmol/L (ref 135–145)
Total Bilirubin: 0.4 mg/dL (ref 0.3–1.2)
Total Protein: 6.3 g/dL — ABNORMAL LOW (ref 6.5–8.1)

## 2018-10-02 LAB — CBC WITH DIFFERENTIAL (CANCER CENTER ONLY)
Abs Immature Granulocytes: 0.02 10*3/uL (ref 0.00–0.07)
Basophils Absolute: 0 10*3/uL (ref 0.0–0.1)
Basophils Relative: 0 %
Eosinophils Absolute: 0 10*3/uL (ref 0.0–0.5)
Eosinophils Relative: 0 %
HCT: 27 % — ABNORMAL LOW (ref 36.0–46.0)
Hemoglobin: 9.1 g/dL — ABNORMAL LOW (ref 12.0–15.0)
Immature Granulocytes: 0 %
Lymphocytes Relative: 24 %
Lymphs Abs: 1.2 10*3/uL (ref 0.7–4.0)
MCH: 34.6 pg — ABNORMAL HIGH (ref 26.0–34.0)
MCHC: 33.7 g/dL (ref 30.0–36.0)
MCV: 102.7 fL — ABNORMAL HIGH (ref 80.0–100.0)
Monocytes Absolute: 0.3 10*3/uL (ref 0.1–1.0)
Monocytes Relative: 7 %
Neutro Abs: 3.3 10*3/uL (ref 1.7–7.7)
Neutrophils Relative %: 69 %
Platelet Count: 165 10*3/uL (ref 150–400)
RBC: 2.63 MIL/uL — ABNORMAL LOW (ref 3.87–5.11)
RDW: 17.2 % — ABNORMAL HIGH (ref 11.5–15.5)
WBC Count: 4.9 10*3/uL (ref 4.0–10.5)
nRBC: 0 % (ref 0.0–0.2)

## 2018-10-02 LAB — INFLUENZA PANEL BY PCR (TYPE A & B)
Influenza A By PCR: NEGATIVE
Influenza B By PCR: NEGATIVE

## 2018-10-02 LAB — GROUP A STREP BY PCR: Group A Strep by PCR: NOT DETECTED

## 2018-10-02 MED ORDER — SODIUM CHLORIDE 0.9 % IV SOLN
Freq: Once | INTRAVENOUS | Status: AC
  Administered 2018-10-02: 16:00:00 via INTRAVENOUS
  Filled 2018-10-02: qty 250

## 2018-10-02 MED ORDER — HEPARIN SOD (PORK) LOCK FLUSH 100 UNIT/ML IV SOLN
500.0000 [IU] | Freq: Once | INTRAVENOUS | Status: AC | PRN
  Administered 2018-10-02: 500 [IU]
  Filled 2018-10-02: qty 5

## 2018-10-02 MED ORDER — AMOXICILLIN-POT CLAVULANATE 875-125 MG PO TABS: 1.0000 | ORAL_TABLET | Freq: Two times a day (BID) | ORAL | 0 refills | Status: DC

## 2018-10-02 MED ORDER — SODIUM CHLORIDE 0.9% FLUSH
10.0000 mL | INTRAVENOUS | Status: DC | PRN
  Administered 2018-10-02: 10 mL
  Filled 2018-10-02: qty 10

## 2018-10-02 NOTE — Patient Instructions (Signed)
Person Under Monitoring Name: Holly Wiley  Location: [address] [place] [address]   Record here the list of visitors to your home since you became ill with respiratory symptoms that led you to consult a health provider:  Visitor Name Date Time In Time Out Did this person come within 6 feet of you? Indicate Y or N Relationship to Person Under Monitoring Phone number Comments   ___/____/____ __:__ AM/PM __:__ AM/PM       ___/____/____ __:__ AM/PM __:__ AM/PM       ___/____/____ __:__ AM/PM __:__ AM/PM       ___/____/____ __:__ AM/PM __:__ AM/PM       ___/____/____ __:__ AM/PM __:__ AM/PM       ___/____/____ __:__ AM/PM __:__ AM/PM       ___/____/____ __:__ AM/PM __:__ AM/PM       ___/____/____ __:__ AM/PM __:__ AM/PM       ___/____/____ __:__ AM/PM __:__ AM/PM       ___/____/____ __:__ AM/PM __:__ AM/PM       ___/____/____ __:__ AM/PM __:__ AM/PM       ___/____/____ __:__ AM/PM __:__ AM/PM       ___/____/____ __:__ AM/PM __:__ AM/PM       ___/____/____ __:__ AM/PM __:__ AM/PM       Marice Potter, Division of [place], Communicable Disease Branch   Person Under Monitoring Name: Holly Wiley  Location: [address] [place] [address]   CORONAVIRUS DISEASE 2019 (COVID-19) Guidance for Persons Under Investigation You are being tested for the virus that causes coronavirus disease 2019 (COVID-19). Public health actions are necessary to ensure protection of your health and the health of others, and to prevent further spread of infection. COVID-19 is caused by a virus that can cause symptoms, such as fever, cough, and shortness of breath. The primary transmission from person to person is by coughing or sneezing. On July 30, 2018, the [place] announced a [employer] Emergency of International Concern and on July 31, 2018 the U.S. Department of Health and Human Services declared a public health emergency. If the virus that causesCOVID-19  spreads in the community, it could have severe public health consequences.  As a person under investigation for COVID-19, the [place] advises you to adhere to the following guidance until your test results are reported to you. If your test result is positive, you will receive additional information from your provider and your local health department at that time.   Remain at home until you are cleared by your health provider or public health authorities.   Keep a log of visitors to your home using the form provided. Any visitors to your home must be aware of your isolation status.  If you plan to move to a new address or leave the county, notify the local health department in your county.  Call a doctor or seek care if you have an urgent medical need. Before seeking medical care, call ahead and get instructions from the provider before arriving at the medical office, clinic or hospital. Notify them that you are being tested for the virus that causes COVID-19 so arrangements can be made, as necessary, to prevent transmission to others in the healthcare setting. Next, notify the local health department in your county.  If a medical emergency arises and you need to call 911, inform the first responders that you are being tested for  the virus that causes COVID-19. Next, notify the local health department in your county.  Adhere to all guidance set forth by the [place] for [facility] of patients that is based on guidance from the Center for Disease Control and Prevention with suspected or confirmed COVID-19. It is provided with this guidance for Persons Under Investigation.  Your health and the health of our community are our top priorities. Public Health officials remain available to provide assistance and counseling to you about COVID-19 and compliance with this guidance.  Provider:  ____________________________________________________________ Date: ______/_____/_________  By signing below, you acknowledge that you have read and agree to comply with this Guidance for Persons Under Investigation. ______________________________________________________________ Date: ______/_____/_________  WHO DO I CALL? You can find a list of local health departments here: [email or web address] Health Department: ____________________________________________________________________ Contact Name: ________________________________________________________________________ Telephone: ___________________________________________________________________________  Marice Potter, [place], Communicable Disease Branch COVID-19 Guidance for Persons Under Investigation March 7, 2020This information is directly available on the CDC website: RunningShows.co.za.html    Source:CDC Reference to specific commercial products, manufacturers, companies, or trademarks does not constitute its endorsement or recommendation by the [place], [place], or Centers for [employer] and Prevention.

## 2018-10-02 NOTE — Progress Notes (Signed)
Orders placed by MD Gaye Pollack from flu, strep, and Covid-19 testing.  Specimens given to lab.  Appropriate isolation precautions initiated.  Pt brought in side door of [place], minimal contact with others.  Pt to receive 1L IVF NS over 1 hour.  Currently afebrile, reported 101.1-101.7 temp at home before taking tylenol.  HR 110-115.  Pt advised of self-isolation protocols by NP Mendel Ryder and to start augmentin oral dose at home, VU of d/c instructions and to f/u by phone as needed.  VS rechecked, HR 102 and temp 98.6, NP aware.  Reports feeling a bit better at end of infusion.  Port deaccessed.  Pt escorted to exit, ambulatory w/steady gait, with belongings.  Denies any other questions or concerns at this time.  Urine sample not collected, cancelled per NP Lindsey/MD Gudena, lab aware.  EVS alerted, AD Melanie alerted.  NP Mendel Ryder to alert ID of testing.

## 2018-10-02 NOTE — Telephone Encounter (Signed)
Pt called reporting fever of 101.1 and fatigue and nausea.  Denies V/D or resp symptoms, some SOB at baseline but no increase in this or cough.  Denies CP or rash.  Pt otw to [facility] to see NP Mendel Ryder.  Orders placed, appt requests made.

## 2018-10-02 NOTE — Progress Notes (Signed)
[facility] Follow up:    [name], [place] [address]   DIAGNOSIS: Cancer Staging Malignant neoplasm of upper-outer quadrant of right breast in female, estrogen receptor negative ([place]) Staging form: Breast, AJCC 8th Edition - Clinical stage from 06/29/2018: Stage IB (cT1c, cN0, cM0, G3, ER-, PR-, HER2-) - Signed by Nicholas Lose, MD on 06/29/2018   SUMMARY OF ONCOLOGIC HISTORY:   Malignant neoplasm of upper-outer quadrant of right breast in female, estrogen receptor negative ([place])   06/22/2018 Initial Diagnosis    Screening mammogram detected right breast mass 1.1 cm at 10 o'clock position right breast 12 cm from the nipple, no abnormal lymph nodes, biopsy of the mass revealed IDC with DCIS grade 3, ER 0%, PR 0%, HER-2 -1+ by IHC, Ki-67 40%, T1c N0 stage Ib    06/29/2018 Cancer Staging    Staging form: Breast, AJCC 8th Edition - Clinical stage from 06/29/2018: Stage IB (cT1c, cN0, cM0, G3, ER-, PR-, HER2-) - Signed by Nicholas Lose, MD on 06/29/2018    07/09/2018 -  Neo-Adjuvant Chemotherapy     Neo- Adjuvant chemotherapy with dose dense Adriamycin and Cytoxan x4 followed by Taxol and carboplatin weekly x12     CURRENT THERAPY: Taxol/Carboplatin  INTERVAL HISTORY: Holly Wiley 43 y.o. female returns for urgent evaluation of fever.  Prior to her arrival she says that her dyspnea was at baseline, though I did confront her appearing more short of breath than previous, and then she admitted, that she is more short of breath than she has been.  She has no cough.  No urinary or skin symptoms.  She feels terrible.  She is achy.  No sore throat.  No nasal drainage.  No headache.     Patient Active Problem List   Diagnosis Date Noted  . Genetic testing 08/20/2018  . 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 Dehydration 07/24/2018  . Nausea and vomiting 07/24/2018  . Nausea without vomiting 07/24/2018  . Family history of breast  cancer   . Family history of colon cancer   . Family history of bladder cancer   . Family history of pancreatic cancer   . Port-A-Cath in place 07/16/2018  . Malignant neoplasm of upper-outer quadrant of right breast in female, estrogen receptor negative ([place]) 06/29/2018  . 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 Macromastia 11/19/2013  . Breast mass, right 10/20/2013  . Abnormal Pap smear 01/01/2012    is allergic to bee venom; dihydroergotamine; imitrex [sumatriptan]; metoclopramide; and transderm-scop [scopolamine].  MEDICAL HISTORY: Past Medical History:  Diagnosis Date  . Breast cancer ([place]) 07/2018   right  . Dental crown present   . Family history of bladder cancer   . Family history of breast cancer   . Family history of colon cancer   . Family history of pancreatic cancer   . 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  . Migraines   . PONV (postoperative nausea and vomiting)     SURGICAL HISTORY: Past Surgical History:  Procedure Laterality Date  . BREAST BIOPSY Right 11/08/2013   Procedure: EXCISION OF RIGHT  BREAST MASS;  Surgeon: Adin Hector, MD;  Location: [facility];  Service: General;  Laterality: Right;  . BUNIONECTOMY Left   . CARPAL TUNNEL RELEASE Right 02/26/2017  . CERVICAL CONE BIOPSY  05/2005  . COLONOSCOPY  2014  . PORTACATH PLACEMENT Right 07/08/2018   Procedure:  INSERTION PORT-A-CATH;  Surgeon: Fanny Skates, MD;  Location: [facility];  Service: General;  Laterality: Right;  . SHOULDER ARTHROSCOPY W/ LABRAL REPAIR Right   . SHOULDER ARTHROSCOPY W/ ROTATOR CUFF REPAIR Left   . TENOTOMY FOREARM / WRIST Right 02/26/2017    SOCIAL HISTORY: Social History   Socioeconomic History  . Marital status: Divorced    Spouse name: Not on file  . Number of children: Not on file  . Years of education: Not on file  . Highest education level: Not on file   Occupational History  . Not on file  Social Needs  . Financial resource strain: Not on file  . Food insecurity:    Worry: Not on file    Inability: Not on file  . Transportation needs:    Medical: Not on file    Non-medical: Not on file  Tobacco Use  . Smoking status: Never Smoker  . Smokeless tobacco: Never Used  Substance and Sexual Activity  . Alcohol use: Yes    Comment: occasionally  . Drug use: No  . Sexual activity: Not on file  Lifestyle  . Physical activity:    Days per week: Not on file    Minutes per session: Not on file  . Stress: Not on file  Relationships  . Social connections:    Talks on phone: Not on file    Gets together: Not on file    Attends religious service: Not on file    Active member of club or organization: Not on file    Attends meetings of clubs or organizations: Not on file    Relationship status: Not on file  . Intimate partner violence:    Fear of current or ex partner: Not on file    Emotionally abused: Not on file    Physically abused: Not on file    Forced sexual activity: Not on file  Other Topics Concern  . Not on file  Social History Narrative  . Not on file    FAMILY HISTORY: Family History  Problem Relation Age of Onset  . Breast cancer Mother 80       triple negative, GT neg  . Diabetes Maternal Aunt   . Bladder Cancer Maternal Aunt 69  . Colon cancer Maternal Uncle 37  . Diabetes Maternal Uncle   . Diabetes Maternal Grandmother   . Stroke Paternal Grandfather   . Colon cancer Other 54  . Breast cancer Other        dx >50  . Colon cancer Other        dx>50  . Pancreatic cancer Other     Review of Systems  Constitutional: Positive for chills, fatigue and fever. Negative for appetite change and unexpected weight change.  HENT:   Negative for hearing loss, lump/mass, mouth sores, sore throat, trouble swallowing and voice change.   Eyes: Negative for eye problems and icterus.  Respiratory: Positive for shortness  of breath. Negative for chest tightness and cough.   Cardiovascular: Negative for chest pain, leg swelling and palpitations.  Gastrointestinal: Negative for abdominal distention, abdominal pain, constipation, diarrhea, nausea and vomiting.  Endocrine: Negative for hot flashes.  Skin: Negative for itching and rash.  Hematological: Negative for adenopathy. Does not bruise/bleed easily.  Psychiatric/Behavioral: Negative for depression. The patient is not nervous/anxious.       PHYSICAL EXAMINATION  ECOG PERFORMANCE STATUS: 1 - Symptomatic but completely ambulatory  Vitals:   10/02/18 1519 10/02/18 1648  BP: 134/69  Pulse: (!) 112 (!) 102  Resp: 18   Temp: 98.6 F (37 C) 98.6 F (37 C)  SpO2: 100%     Physical Exam Constitutional:      Appearance: She is ill-appearing. She is not toxic-appearing.  HENT:     Head: Normocephalic.     Nose: No congestion.     Mouth/Throat:     Mouth: Mucous membranes are moist.     Pharynx: Oropharynx is clear. No oropharyngeal exudate or posterior oropharyngeal erythema.  Eyes:     General: No scleral icterus.    Pupils: Pupils are equal, round, and reactive to light.  Neck:     Musculoskeletal: Neck supple.  Cardiovascular:     Rate and Rhythm: Normal rate and regular rhythm.     Pulses: Normal pulses.     Heart sounds: Normal heart sounds.  Pulmonary:     Effort: Pulmonary effort is normal.     Breath sounds: Normal breath sounds.  Abdominal:     General: Abdomen is flat. Bowel sounds are normal. There is no distension.     Palpations: Abdomen is soft.     Tenderness: There is no abdominal tenderness.  Musculoskeletal:        General: No swelling.  Lymphadenopathy:     Cervical: No cervical adenopathy.  Skin:    General: Skin is warm and dry.     Capillary Refill: Capillary refill takes less than 2 seconds.  Neurological:     General: No focal deficit present.     Mental Status: She is alert.  Psychiatric:        Mood and  Affect: Mood normal.        Behavior: Behavior normal.     LABORATORY DATA:  CBC    Component Value Date/Time   WBC 4.9 10/02/2018 1530   WBC 4.3 09/10/2018 1015   RBC 2.63 (L) 10/02/2018 1530   HGB 9.1 (L) 10/02/2018 1530   HGB 14.0 10/01/2005 1330   HCT 27.0 (L) 10/02/2018 1530   HCT 39.9 10/01/2005 1330   PLT 165 10/02/2018 1530   PLT 219 10/01/2005 1330   MCV 102.7 (H) 10/02/2018 1530   MCV 92.3 10/01/2005 1330   MCH 34.6 (H) 10/02/2018 1530   MCHC 33.7 10/02/2018 1530   RDW 17.2 (H) 10/02/2018 1530   RDW 13.0 10/01/2005 1330   LYMPHSABS 1.2 10/02/2018 1530   LYMPHSABS 1.3 10/01/2005 1330   MONOABS 0.3 10/02/2018 1530   MONOABS 0.3 10/01/2005 1330   EOSABS 0.0 10/02/2018 1530   EOSABS 0.0 10/01/2005 1330   BASOSABS 0.0 10/02/2018 1530   BASOSABS 0.0 10/01/2005 1330    CMP     Component Value Date/Time   NA 140 10/02/2018 1530   K 3.3 (L) 10/02/2018 1530   CL 109 10/02/2018 1530   CO2 23 10/02/2018 1530   GLUCOSE 123 (H) 10/02/2018 1530   BUN 13 10/02/2018 1530   CREATININE 0.70 10/02/2018 1530   CALCIUM 8.5 (L) 10/02/2018 1530   PROT 6.3 (L) 10/02/2018 1530   ALBUMIN 3.5 10/02/2018 1530   AST 17 10/02/2018 1530   ALT 36 10/02/2018 1530   ALKPHOS 59 10/02/2018 1530   BILITOT 0.4 10/02/2018 1530   GFRNONAA >60 10/02/2018 1530   GFRAA >60 10/02/2018 1530      ASSESSMENT and PLAN:   Malignant neoplasm of upper-outer quadrant of right breast in female, estrogen receptor negative ([place]) 06/22/2018:Screening mammogram detected right breast mass 1.1 cm at 10 o'clock position right  breast 12 cm from the nipple, no abnormal lymph nodes, biopsy of the mass revealed IDC with DCIS grade 3, ER 0%, PR 0%, HER-2 -1+ by IHC, Ki-67 40%, T1c N0 stage Ib  Recommendation: 1.Neo-Adjuvant chemotherapy with dose dense Adriamycin and Cytoxan x4 followed by Taxol and carboplatinweekly x12 2.Patient prefers bilateral mastectomies plus or minus reconstruction Followed  by surveillance --------------------------------------------------------------------------------------------------------------------------------- MRI breast 07/04/2018: Known malignancy right UOQ 1.4 cm, upper central portion right breast 0.7 cm, LIQ left breast 0.7 cm Left breast biopsy 07/14/2018: Fibrocystic changes  Current treatment:Completed 4 cycles of dose dense Adriamycin and Cytoxan, yesterday received cycle 5Taxol with carboplatin given every three weeks   Jazelle is here with fever of 101.7 at home.  She took tylenol and defervesced.  She is more short of breath than normal, and appears more tachypneic, and her oxygen saturation is normal.  After she and I talked, she admitted that her shortness of breath was worse.  Her lungs are clear.  I reviewed with Dr. Lindi Adie.  I ordered 2 sets of blood cultures, and we also swabbed her for flu, strep, and COVID-19.  I recommended that since she was tested, she needs to be on quarantine until further notice.  Augmentin BID sent into her pharmacy.  We will likely need to move her appointments next week.  I gave her COVID discharge instructions accordingly.  I reviewed red flag signs for her to return to ER.       Orders Placed This Encounter  Procedures  . Rapid strep screen (not at [facility])  . Novel Coronavirus, NAA ([facility])    [facility] lab instead.    Standing Status:   Future    Number of Occurrences:   1    Standing Expiration Date:   10/02/2019  . Novel Coronavirus, NAA (hospital order; send-out to ref lab)  . Group A Strep by PCR  . Influenza panel by PCR (type A & B)    Standing Status:   Future    Number of Occurrences:   1    Standing Expiration Date:   10/02/2019    All questions were answered. The patient knows to call the clinic with any problems, questions or concerns. We can certainly see the patient much sooner if necessary.   This note was electronically signed. Scot Dock, NP 10/02/2018

## 2018-10-02 NOTE — Assessment & Plan Note (Addendum)
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%, T1c N0 stage Ib  Recommendation: 1.Neo-Adjuvant chemotherapy with dose dense Adriamycin and Cytoxan x4 followed by Taxol and carboplatinweekly x12 2.Patient prefers bilateral mastectomies plus or minus reconstruction Followed by surveillance --------------------------------------------------------------------------------------------------------------------------------- MRI breast 07/04/2018: Known malignancy right UOQ 1.4 cm, upper central portion right breast 0.7 cm, LIQ left breast 0.7 cm Left breast biopsy 07/14/2018: Fibrocystic changes  Current treatment:Completed 4 cycles of dose dense Adriamycin and Cytoxan, yesterday received cycle 5Taxol with carboplatin given every three weeks   Holly Wiley is here with fever of 101.7 at home.  She took tylenol and defervesced.  She is more short of breath than normal, and appears more tachypneic, and her oxygen saturation is normal.  After she and I talked, she admitted that her shortness of breath was worse.  Her lungs are clear.  I reviewed with Dr. Lindi Adie.  I ordered 2 sets of blood cultures, and we also swabbed her for flu, strep, and COVID-19.  I recommended that since she was tested, she needs to be on quarantine until further notice.  Augmentin BID sent into her pharmacy.  We will likely need to move her appointments next week.  I gave her COVID discharge instructions accordingly.  I reviewed red flag signs for her to return to ER.

## 2018-10-05 ENCOUNTER — Telehealth: Payer: Self-pay | Admitting: Adult Health

## 2018-10-05 NOTE — Telephone Encounter (Signed)
Called per lindsey was told to cancel 4/9 patient may need to speak with nurse

## 2018-10-05 NOTE — Telephone Encounter (Signed)
Per 4/6 sch msg cancel 4/9

## 2018-10-05 NOTE — Telephone Encounter (Signed)
Called patient and spoke with her mom Danton Clap about her results.  Holly Wiley has remained Febrile and is taking Advil as needed.  She is taking Augmentin BID.  I let her know that the flu and the strep were negative.  No red flags noted by patient.  I let her know I would call with results as I receive them.  Wilber Bihari, NP

## 2018-10-06 ENCOUNTER — Ambulatory Visit (HOSPITAL_COMMUNITY): Payer: BLUE CROSS/BLUE SHIELD

## 2018-10-06 ENCOUNTER — Telehealth: Payer: Self-pay | Admitting: Hematology and Oncology

## 2018-10-06 ENCOUNTER — Encounter: Payer: Self-pay | Admitting: Hematology and Oncology

## 2018-10-06 LAB — NOVEL CORONAVIRUS, NAA (HOSP ORDER, SEND-OUT TO REF LAB; TAT 18-24 HRS): SARS-CoV-2, NAA: NOT DETECTED

## 2018-10-06 NOTE — Telephone Encounter (Signed)
Scheduled appt per 4/7 sch message - pt is aware of appt date and time   

## 2018-10-07 LAB — CULTURE, BLOOD (SINGLE)
Culture: NO GROWTH
Culture: NO GROWTH
Special Requests: ADEQUATE
Special Requests: ADEQUATE

## 2018-10-08 ENCOUNTER — Other Ambulatory Visit: Payer: BLUE CROSS/BLUE SHIELD

## 2018-10-08 ENCOUNTER — Ambulatory Visit: Payer: BLUE CROSS/BLUE SHIELD

## 2018-10-08 ENCOUNTER — Other Ambulatory Visit: Payer: Self-pay

## 2018-10-08 ENCOUNTER — Telehealth: Payer: Self-pay | Admitting: Hematology and Oncology

## 2018-10-08 ENCOUNTER — Ambulatory Visit (HOSPITAL_COMMUNITY)
Admission: RE | Admit: 2018-10-08 | Discharge: 2018-10-08 | Disposition: A | Discharge status: Home / Self Care | Payer: BLUE CROSS/BLUE SHIELD | Source: Ambulatory Visit | Attending: Hematology and Oncology | Admitting: Hematology and Oncology

## 2018-10-08 ENCOUNTER — Ambulatory Visit: Payer: BLUE CROSS/BLUE SHIELD | Admitting: Hematology and Oncology

## 2018-10-08 DIAGNOSIS — Z171 Estrogen receptor negative status [ER-]: Secondary | ICD-10-CM | POA: Diagnosis present

## 2018-10-08 DIAGNOSIS — C50411 Malignant neoplasm of upper-outer quadrant of right female breast: Secondary | ICD-10-CM | POA: Diagnosis present

## 2018-10-08 MED ORDER — GADOBUTROL 1 MMOL/ML IV SOLN
10.0000 mL | Freq: Once | INTRAVENOUS | Status: AC | PRN
  Administered 2018-10-08: 08:00:00 9 mL via INTRAVENOUS

## 2018-10-08 NOTE — Telephone Encounter (Signed)
Cancel all future appointments on schedule per 4/9 schedule message. Per schedule message provider has informed patient.

## 2018-10-08 NOTE — Telephone Encounter (Signed)
Spoke with patient and confirmed that she received her email from me and she already had the app downloaded.

## 2018-10-12 ENCOUNTER — Other Ambulatory Visit: Payer: Self-pay | Admitting: General Surgery

## 2018-10-12 ENCOUNTER — Ambulatory Visit: Payer: BLUE CROSS/BLUE SHIELD | Admitting: Hematology and Oncology

## 2018-10-12 DIAGNOSIS — C50411 Malignant neoplasm of upper-outer quadrant of right female breast: Secondary | ICD-10-CM

## 2018-10-12 DIAGNOSIS — Z171 Estrogen receptor negative status [ER-]: Principal | ICD-10-CM

## 2018-10-15 ENCOUNTER — Other Ambulatory Visit: Payer: BLUE CROSS/BLUE SHIELD

## 2018-10-15 ENCOUNTER — Ambulatory Visit: Payer: BLUE CROSS/BLUE SHIELD | Admitting: Hematology and Oncology

## 2018-10-15 ENCOUNTER — Ambulatory Visit: Payer: BLUE CROSS/BLUE SHIELD

## 2018-10-22 ENCOUNTER — Ambulatory Visit: Payer: BLUE CROSS/BLUE SHIELD

## 2018-10-22 ENCOUNTER — Other Ambulatory Visit: Payer: BLUE CROSS/BLUE SHIELD

## 2018-10-28 NOTE — Pre-Procedure Instructions (Signed)
Holly Wiley  10/28/2018     [facility], Holly Wiley, [place] [address] [place] [address] Phone: [phone] Fax: [phone]  [place], [place] [address] [place] [address] Phone: [phone] Fax: [phone]    Your procedure is scheduled on Friday, May 8th  Report to [facility] Entrance A at 10:30 A.M.  Call this number if you have problems the morning of surgery:  [phone]   Remember:  Do not eat after midnight.i  You may drink clear liquids until 9:30 A.M .  lear liquids allowed are:                    Water, Juice (non-citric and without pulp), Carbonated beverages, Clear Tea, Black Coffee only, Plain Jell-O only, Gatorade and Plain Popsicles only    Take these medicines the morning of surgery with A SIP OF WATER  NONE  If needed - EPINEPHrine (EPIPEN) 0.3 mg/0.3 mL IJ SOAJ injection  Follow your surgeon's instructions on when to stop Aspirin.  If no instructions were given by your surgeon then you will need to call the office to get those instructions.    7 days prior to surgery STOP taking any Aspirin (unless otherwise instructed by your surgeon), Aleve, Naproxen, Ibuprofen, Motrin, Advil, Goody's, BC's, all herbal medications, fish oil, and all vitamins.    Do not wear jewelry, make-up or nail polish.  Do not wear lotions, powders, or perfumes, or deodorant.  Do not shave 48 hours prior to surgery.   Do not bring valuables to the hospital.  [facility] is not responsible for any belongings or valuables.  Contacts, dentures or bridgework may not be worn into surgery.  Leave your suitcase in the car.  After surgery it may be brought to your room.  For patients admitted to the hospital, discharge time will be determined by your treatment team.  Patients discharged the day of surgery will not be allowed to drive home.   Special instructions:  [facility]- Preparing For Surgery  Before  surgery, you can play an important role. Because skin is not sterile, your skin needs to be as free of germs as possible. You can reduce the number of germs on your skin by washing with CHG (chlorahexidine gluconate) Soap before surgery.  CHG is an antiseptic cleaner which kills germs and bonds with the skin to continue killing germs even after washing.    Oral Hygiene is also important to reduce your risk of infection.  Remember - BRUSH YOUR TEETH THE MORNING OF SURGERY WITH YOUR REGULAR TOOTHPASTE  Please do not use if you have an allergy to CHG or antibacterial soaps. If your skin becomes reddened/irritated stop using the CHG.  Do not shave (including legs and underarms) for at least 48 hours prior to first CHG shower. It is OK to shave your face.  Please follow these instructions carefully.   1. Shower the NIGHT BEFORE SURGERY and the MORNING OF SURGERY with CHG.   2. If you chose to wash your hair, wash your hair first as usual with your normal shampoo.  3. After you shampoo, rinse your hair and body thoroughly to remove the shampoo.  4. Use CHG as you would any other liquid soap. You can apply CHG directly to the skin and wash gently with a scrungie or a clean washcloth.   5. Apply the CHG Soap to your body ONLY  FROM THE NECK DOWN.  Do not use on open wounds or open sores. Avoid contact with your eyes, ears, mouth and genitals (private parts). Wash Face and genitals (private parts)  with your normal soap.  6. Wash thoroughly, paying special attention to the area where your surgery will be performed.  7. Thoroughly rinse your body with warm water from the neck down.  8. DO NOT shower/wash with your normal soap after using and rinsing off the CHG Soap.  9. Pat yourself dry with a CLEAN TOWEL.  10. Wear CLEAN PAJAMAS to bed the night before surgery, wear comfortable clothes the morning of surgery  11. Place CLEAN SHEETS on your bed the night of your first shower and DO NOT SLEEP WITH  PETS.  Day of Surgery:  Do not apply any deodorants/lotions.  Please wear clean clothes to the hospital/surgery center.   Remember to brush your teeth WITH YOUR REGULAR TOOTHPASTE.  Please read over the following fact sheets that you were given. Pain Booklet, Coughing and Deep Breathing and Surgical Site Infection Prevention

## 2018-10-29 ENCOUNTER — Other Ambulatory Visit: Payer: BLUE CROSS/BLUE SHIELD

## 2018-10-29 ENCOUNTER — Other Ambulatory Visit: Payer: Self-pay

## 2018-10-29 ENCOUNTER — Encounter (HOSPITAL_COMMUNITY): Payer: Self-pay

## 2018-10-29 ENCOUNTER — Ambulatory Visit: Payer: BLUE CROSS/BLUE SHIELD

## 2018-10-29 ENCOUNTER — Encounter (HOSPITAL_COMMUNITY)
Admission: RE | Admit: 2018-10-29 | Discharge: 2018-10-29 | Disposition: A | Discharge status: Home / Self Care | Payer: BLUE CROSS/BLUE SHIELD | Source: Ambulatory Visit | Attending: General Surgery | Admitting: General Surgery

## 2018-10-29 ENCOUNTER — Ambulatory Visit: Payer: BLUE CROSS/BLUE SHIELD | Admitting: Hematology and Oncology

## 2018-10-29 DIAGNOSIS — Z01812 Encounter for preprocedural laboratory examination: Secondary | ICD-10-CM | POA: Diagnosis not present

## 2018-10-29 LAB — CBC WITH DIFFERENTIAL/PLATELET
Abs Immature Granulocytes: 0.01 10*3/uL (ref 0.00–0.07)
Basophils Absolute: 0 10*3/uL (ref 0.0–0.1)
Basophils Relative: 1 %
Eosinophils Absolute: 0.3 10*3/uL (ref 0.0–0.5)
Eosinophils Relative: 9 %
HCT: 39.1 % (ref 36.0–46.0)
Hemoglobin: 12.7 g/dL (ref 12.0–15.0)
Immature Granulocytes: 0 %
Lymphocytes Relative: 38 %
Lymphs Abs: 1.2 10*3/uL (ref 0.7–4.0)
MCH: 34.4 pg — ABNORMAL HIGH (ref 26.0–34.0)
MCHC: 32.5 g/dL (ref 30.0–36.0)
MCV: 106 fL — ABNORMAL HIGH (ref 80.0–100.0)
Monocytes Absolute: 0.4 10*3/uL (ref 0.1–1.0)
Monocytes Relative: 12 %
Neutro Abs: 1.3 10*3/uL — ABNORMAL LOW (ref 1.7–7.7)
Neutrophils Relative %: 40 %
Platelets: 202 10*3/uL (ref 150–400)
RBC: 3.69 MIL/uL — ABNORMAL LOW (ref 3.87–5.11)
RDW: 13.2 % (ref 11.5–15.5)
WBC: 3.2 10*3/uL — ABNORMAL LOW (ref 4.0–10.5)
nRBC: 0 % (ref 0.0–0.2)

## 2018-10-29 LAB — COMPREHENSIVE METABOLIC PANEL
ALT: 28 U/L (ref 0–44)
AST: 23 U/L (ref 15–41)
Albumin: 3.9 g/dL (ref 3.5–5.0)
Alkaline Phosphatase: 65 U/L (ref 38–126)
Anion gap: 7 (ref 5–15)
BUN: 12 mg/dL (ref 6–20)
CO2: 26 mmol/L (ref 22–32)
Calcium: 9.1 mg/dL (ref 8.9–10.3)
Chloride: 106 mmol/L (ref 98–111)
Creatinine, Ser: 0.65 mg/dL (ref 0.44–1.00)
Glucose, Bld: 96 mg/dL (ref 70–99)
Potassium: 4 mmol/L (ref 3.5–5.1)
Sodium: 139 mmol/L (ref 135–145)
Total Bilirubin: 0.5 mg/dL (ref 0.3–1.2)
Total Protein: 6.3 g/dL — ABNORMAL LOW (ref 6.5–8.1)

## 2018-10-29 NOTE — Progress Notes (Signed)
PCP -  Lucita Lora, NP Cardiologist - denies Oncologist - Dr. Nicholas Lose   Chest x-ray - denies EKG - denies Stress Test - denies  ECHO - 09/08/2018 Cardiac Cath - denies  Sleep Study - denies CPAP - No  Blood Thinner Instructions: N/A Aspirin Instructions: N/A  Anesthesia review: No  Coronavirus Screening  Have you experienced the following symptoms:  Cough yes/no: No Fever (>100.60F)  yes/no: No Runny nose yes/no: No Sore throat yes/no: No Difficulty breathing/shortness of breath  yes/no: No  Have you or a family member traveled in the last 14 days and where? yes/no: No  If the patient indicates "YES" to the above questions, their PAT will be rescheduled to limit the exposure to others and, the surgeon will be notified. THE PATIENT WILL NEED TO BE ASYMPTOMATIC FOR 14 DAYS.   If the patient is not experiencing any of these symptoms, the PAT nurse will instruct them to NOT bring anyone with them to their appointment since they may have these symptoms or traveled as well.   Please remind your patients and families that hospital visitation restrictions are in effect and the importance of the restrictions. '  Patient denies shortness of breath, fever, cough and chest pain at PAT appointment  Patient verbalized understanding of instructions that were given to them at the PAT appointment. Patient was also instructed that they will need to review over the PAT instructions again at home before surgery.
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
## 2018-11-05 ENCOUNTER — Ambulatory Visit: Payer: BLUE CROSS/BLUE SHIELD

## 2018-11-05 ENCOUNTER — Other Ambulatory Visit: Payer: BLUE CROSS/BLUE SHIELD

## 2018-11-05 MED ORDER — CEFAZOLIN SODIUM-DEXTROSE 2-4 GM/100ML-% IV SOLN
2.0000 g | INTRAVENOUS | Status: AC
  Administered 2018-11-06: 2 g via INTRAVENOUS
  Filled 2018-11-05: qty 100

## 2018-11-06 ENCOUNTER — Inpatient Hospital Stay (HOSPITAL_COMMUNITY)
Admission: RE | Admit: 2018-11-06 | Discharge: 2018-11-09 | DRG: 581 | Disposition: A | Discharge status: Home / Self Care | Payer: BLUE CROSS/BLUE SHIELD | Attending: General Surgery | Admitting: General Surgery

## 2018-11-06 ENCOUNTER — Encounter (HOSPITAL_COMMUNITY): Payer: BLUE CROSS/BLUE SHIELD

## 2018-11-06 ENCOUNTER — Other Ambulatory Visit: Payer: Self-pay

## 2018-11-06 ENCOUNTER — Ambulatory Visit (HOSPITAL_COMMUNITY): Payer: BLUE CROSS/BLUE SHIELD | Admitting: Vascular Surgery

## 2018-11-06 ENCOUNTER — Ambulatory Visit (HOSPITAL_COMMUNITY): Payer: BLUE CROSS/BLUE SHIELD | Admitting: Registered Nurse

## 2018-11-06 ENCOUNTER — Encounter (HOSPITAL_COMMUNITY): Payer: Self-pay

## 2018-11-06 ENCOUNTER — Encounter (HOSPITAL_COMMUNITY)
Admission: RE | Admit: 2018-11-06 | Discharge: 2018-11-06 | Disposition: A | Discharge status: Home / Self Care | Payer: BLUE CROSS/BLUE SHIELD | Source: Ambulatory Visit | Attending: General Surgery | Admitting: General Surgery

## 2018-11-06 ENCOUNTER — Other Ambulatory Visit: Payer: Self-pay | Admitting: General Surgery

## 2018-11-06 ENCOUNTER — Encounter (HOSPITAL_COMMUNITY)
Admission: RE | Disposition: A | Discharge status: Home / Self Care | Payer: Self-pay | Source: Home / Self Care | Attending: General Surgery

## 2018-11-06 DIAGNOSIS — C50411 Malignant neoplasm of upper-outer quadrant of right female breast: Secondary | ICD-10-CM

## 2018-11-06 DIAGNOSIS — Z171 Estrogen receptor negative status [ER-]: Secondary | ICD-10-CM

## 2018-11-06 DIAGNOSIS — G588 Other specified mononeuropathies: Secondary | ICD-10-CM | POA: Diagnosis not present

## 2018-11-06 DIAGNOSIS — N62 Hypertrophy of breast: Secondary | ICD-10-CM | POA: Diagnosis present

## 2018-11-06 DIAGNOSIS — Z803 Family history of malignant neoplasm of breast: Secondary | ICD-10-CM

## 2018-11-06 DIAGNOSIS — Z452 Encounter for adjustment and management of vascular access device: Secondary | ICD-10-CM

## 2018-11-06 DIAGNOSIS — K5900 Constipation, unspecified: Secondary | ICD-10-CM | POA: Diagnosis not present

## 2018-11-06 DIAGNOSIS — Z95828 Presence of other vascular implants and grafts: Secondary | ICD-10-CM

## 2018-11-06 DIAGNOSIS — Z9221 Personal history of antineoplastic chemotherapy: Secondary | ICD-10-CM

## 2018-11-06 DIAGNOSIS — G43909 Migraine, unspecified, not intractable, without status migrainosus: Secondary | ICD-10-CM | POA: Diagnosis present

## 2018-11-06 HISTORY — PX: BILATERAL TOTAL MASTECTOMY WITH AXILLARY LYMPH NODE DISSECTION: SHX6364

## 2018-11-06 HISTORY — DX: Malignant neoplasm of upper-outer quadrant of right female breast: C50.411

## 2018-11-06 HISTORY — PX: PORT-A-CATH REMOVAL: SHX5289

## 2018-11-06 LAB — POCT PREGNANCY, URINE: Preg Test, Ur: NEGATIVE

## 2018-11-06 SURGERY — BILATERAL TOTAL MASTECTOMY WITH AXILLARY LYMPH NODE DISSECTION
Anesthesia: General | Site: Chest

## 2018-11-06 MED ORDER — PHENYLEPHRINE HCL (PRESSORS) 10 MG/ML IV SOLN
INTRAVENOUS | Status: AC
  Filled 2018-11-06: qty 1

## 2018-11-06 MED ORDER — LACTATED RINGERS IV SOLN
INTRAVENOUS | Status: DC
  Administered 2018-11-06 (×2): via INTRAVENOUS

## 2018-11-06 MED ORDER — PROMETHAZINE HCL 25 MG/ML IJ SOLN
6.2500 mg | INTRAMUSCULAR | Status: DC | PRN
  Administered 2018-11-06: 6.25 mg via INTRAVENOUS

## 2018-11-06 MED ORDER — SODIUM CHLORIDE (PF) 0.9 % IJ SOLN
INTRAVENOUS | Status: DC | PRN
  Administered 2018-11-06: 5 mL

## 2018-11-06 MED ORDER — DEXAMETHASONE SODIUM PHOSPHATE 10 MG/ML IJ SOLN
INTRAMUSCULAR | Status: DC | PRN
  Administered 2018-11-06: 5 mg via INTRAVENOUS

## 2018-11-06 MED ORDER — EPHEDRINE SULFATE-NACL 50-0.9 MG/10ML-% IV SOSY
PREFILLED_SYRINGE | INTRAVENOUS | Status: DC | PRN
  Administered 2018-11-06 (×2): 10 mg via INTRAVENOUS

## 2018-11-06 MED ORDER — HYDROMORPHONE HCL 1 MG/ML IJ SOLN
1.0000 mg | INTRAMUSCULAR | Status: DC | PRN
  Administered 2018-11-06 – 2018-11-08 (×9): 1 mg via INTRAVENOUS
  Filled 2018-11-06 (×9): qty 1

## 2018-11-06 MED ORDER — TECHNETIUM TC 99M SULFUR COLLOID FILTERED: 1.0000 | Freq: Once | INTRAVENOUS | Status: DC | PRN

## 2018-11-06 MED ORDER — 0.9 % SODIUM CHLORIDE (POUR BTL) OPTIME
TOPICAL | Status: DC | PRN
  Administered 2018-11-06: 13:00:00 1000 mL

## 2018-11-06 MED ORDER — ACETAMINOPHEN 500 MG PO TABS: 1000.0000 mg | ORAL_TABLET | ORAL | Status: DC

## 2018-11-06 MED ORDER — HYDROMORPHONE HCL 1 MG/ML IJ SOLN
INTRAMUSCULAR | Status: AC
  Administered 2018-11-06: 0.5 mg via INTRAVENOUS
  Filled 2018-11-06: qty 1

## 2018-11-06 MED ORDER — METHOCARBAMOL 500 MG PO TABS
ORAL_TABLET | ORAL | Status: AC
  Filled 2018-11-06: qty 1

## 2018-11-06 MED ORDER — FENTANYL CITRATE (PF) 250 MCG/5ML IJ SOLN
INTRAMUSCULAR | Status: AC
  Filled 2018-11-06: qty 5

## 2018-11-06 MED ORDER — METHOCARBAMOL 500 MG PO TABS
500.0000 mg | ORAL_TABLET | Freq: Four times a day (QID) | ORAL | Status: DC | PRN
  Administered 2018-11-06: 500 mg via ORAL
  Filled 2018-11-06: qty 1

## 2018-11-06 MED ORDER — CEFAZOLIN SODIUM-DEXTROSE 2-4 GM/100ML-% IV SOLN
2.0000 g | Freq: Three times a day (TID) | INTRAVENOUS | Status: AC
  Administered 2018-11-06: 2 g via INTRAVENOUS
  Filled 2018-11-06: qty 100

## 2018-11-06 MED ORDER — MEPERIDINE HCL 25 MG/ML IJ SOLN: 6.2500 mg | INTRAMUSCULAR | Status: DC | PRN

## 2018-11-06 MED ORDER — KETOROLAC TROMETHAMINE 30 MG/ML IJ SOLN
INTRAMUSCULAR | Status: AC
  Filled 2018-11-06: qty 1

## 2018-11-06 MED ORDER — ENOXAPARIN SODIUM 40 MG/0.4ML ~~LOC~~ SOLN: 40.0000 mg | SUBCUTANEOUS | Status: DC

## 2018-11-06 MED ORDER — GLYCOPYRROLATE PF 0.2 MG/ML IJ SOSY
PREFILLED_SYRINGE | INTRAMUSCULAR | Status: AC
  Filled 2018-11-06: qty 1

## 2018-11-06 MED ORDER — DIPHENHYDRAMINE HCL 50 MG/ML IJ SOLN
INTRAMUSCULAR | Status: DC | PRN
  Administered 2018-11-06: 12.5 mg via INTRAVENOUS

## 2018-11-06 MED ORDER — ROPIVACAINE HCL 5 MG/ML IJ SOLN
INTRAMUSCULAR | Status: DC | PRN
  Administered 2018-11-06: 15 mL via PERINEURAL

## 2018-11-06 MED ORDER — LACTATED RINGERS IV SOLN
INTRAVENOUS | Status: DC
  Administered 2018-11-07 – 2018-11-09 (×5): via INTRAVENOUS

## 2018-11-06 MED ORDER — KETOROLAC TROMETHAMINE 15 MG/ML IJ SOLN
15.0000 mg | Freq: Once | INTRAMUSCULAR | Status: AC
  Administered 2018-11-06: 15 mg via INTRAVENOUS
  Filled 2018-11-06: qty 1

## 2018-11-06 MED ORDER — ACETAMINOPHEN 10 MG/ML IV SOLN
INTRAVENOUS | Status: DC | PRN
  Administered 2018-11-06: 1000 mg via INTRAVENOUS

## 2018-11-06 MED ORDER — FENTANYL CITRATE (PF) 100 MCG/2ML IJ SOLN
INTRAMUSCULAR | Status: AC
  Administered 2018-11-06: 100 ug
  Filled 2018-11-06: qty 2

## 2018-11-06 MED ORDER — DEXAMETHASONE SODIUM PHOSPHATE 10 MG/ML IJ SOLN
INTRAMUSCULAR | Status: AC
  Filled 2018-11-06: qty 2

## 2018-11-06 MED ORDER — HYDROMORPHONE HCL 1 MG/ML IJ SOLN
0.2500 mg | INTRAMUSCULAR | Status: DC | PRN
  Administered 2018-11-06 (×4): 0.5 mg via INTRAVENOUS

## 2018-11-06 MED ORDER — PROPOFOL 10 MG/ML IV BOLUS
INTRAVENOUS | Status: AC
  Filled 2018-11-06: qty 20

## 2018-11-06 MED ORDER — TRAMADOL HCL 50 MG PO TABS: 50.0000 mg | ORAL_TABLET | Freq: Four times a day (QID) | ORAL | Status: DC | PRN

## 2018-11-06 MED ORDER — ACETAMINOPHEN 500 MG PO TABS
1000.0000 mg | ORAL_TABLET | Freq: Four times a day (QID) | ORAL | Status: DC
  Administered 2018-11-06 – 2018-11-09 (×10): 1000 mg via ORAL
  Filled 2018-11-06 (×11): qty 2

## 2018-11-06 MED ORDER — ARTIFICIAL TEARS OPHTHALMIC OINT
TOPICAL_OINTMENT | OPHTHALMIC | Status: AC
  Filled 2018-11-06: qty 3.5

## 2018-11-06 MED ORDER — ONDANSETRON HCL 4 MG/2ML IJ SOLN
4.0000 mg | Freq: Four times a day (QID) | INTRAMUSCULAR | Status: DC | PRN
  Administered 2018-11-06 – 2018-11-08 (×5): 4 mg via INTRAVENOUS
  Filled 2018-11-06 (×5): qty 2

## 2018-11-06 MED ORDER — ALBUMIN HUMAN 5 % IV SOLN
INTRAVENOUS | Status: DC | PRN
  Administered 2018-11-06: 16:00:00 via INTRAVENOUS

## 2018-11-06 MED ORDER — DIPHENHYDRAMINE HCL 50 MG/ML IJ SOLN
INTRAMUSCULAR | Status: AC
  Filled 2018-11-06: qty 1

## 2018-11-06 MED ORDER — METHYLENE BLUE 0.5 % INJ SOLN
INTRAVENOUS | Status: AC
  Filled 2018-11-06: qty 10

## 2018-11-06 MED ORDER — SENNA 8.6 MG PO TABS
1.0000 | ORAL_TABLET | Freq: Two times a day (BID) | ORAL | Status: DC
  Administered 2018-11-06 – 2018-11-09 (×6): 8.6 mg via ORAL
  Filled 2018-11-06 (×6): qty 1

## 2018-11-06 MED ORDER — PANTOPRAZOLE SODIUM 40 MG IV SOLR
40.0000 mg | Freq: Every day | INTRAVENOUS | Status: DC
  Administered 2018-11-06: 40 mg via INTRAVENOUS
  Filled 2018-11-06: qty 40

## 2018-11-06 MED ORDER — GABAPENTIN 300 MG PO CAPS: 300.0000 mg | ORAL_CAPSULE | ORAL | Status: DC

## 2018-11-06 MED ORDER — CELECOXIB 200 MG PO CAPS
ORAL_CAPSULE | ORAL | Status: AC
  Administered 2018-11-06: 200 mg via ORAL
  Filled 2018-11-06: qty 1

## 2018-11-06 MED ORDER — CELECOXIB 200 MG PO CAPS
200.0000 mg | ORAL_CAPSULE | ORAL | Status: AC
  Administered 2018-11-06: 200 mg via ORAL

## 2018-11-06 MED ORDER — SODIUM CHLORIDE 0.9 % IV SOLN
INTRAVENOUS | Status: DC | PRN
  Administered 2018-11-06: 30 ug/min via INTRAVENOUS

## 2018-11-06 MED ORDER — EPHEDRINE 5 MG/ML INJ
INTRAVENOUS | Status: AC
  Filled 2018-11-06: qty 10

## 2018-11-06 MED ORDER — LIDOCAINE 2% (20 MG/ML) 5 ML SYRINGE
INTRAMUSCULAR | Status: DC | PRN
  Administered 2018-11-06: 60 mg via INTRAVENOUS

## 2018-11-06 MED ORDER — PROPOFOL 500 MG/50ML IV EMUL
INTRAVENOUS | Status: DC | PRN
  Administered 2018-11-06: 25 ug/kg/min via INTRAVENOUS

## 2018-11-06 MED ORDER — CELECOXIB 200 MG PO CAPS
200.0000 mg | ORAL_CAPSULE | Freq: Two times a day (BID) | ORAL | Status: DC
  Administered 2018-11-06 – 2018-11-07 (×3): 200 mg via ORAL
  Filled 2018-11-06 (×3): qty 1

## 2018-11-06 MED ORDER — GABAPENTIN 300 MG PO CAPS
300.0000 mg | ORAL_CAPSULE | ORAL | Status: AC
  Administered 2018-11-06: 300 mg via ORAL

## 2018-11-06 MED ORDER — ACETAMINOPHEN 500 MG PO TABS
1000.0000 mg | ORAL_TABLET | ORAL | Status: AC
  Administered 2018-11-06: 1000 mg via ORAL

## 2018-11-06 MED ORDER — MIDAZOLAM HCL 5 MG/5ML IJ SOLN
INTRAMUSCULAR | Status: DC | PRN
  Administered 2018-11-06: 2 mg via INTRAVENOUS

## 2018-11-06 MED ORDER — MIDAZOLAM HCL 2 MG/2ML IJ SOLN
INTRAMUSCULAR | Status: AC
  Filled 2018-11-06: qty 2

## 2018-11-06 MED ORDER — OXYCODONE HCL 5 MG/5ML PO SOLN: 5.0000 mg | Freq: Once | ORAL | Status: DC | PRN

## 2018-11-06 MED ORDER — CHLORHEXIDINE GLUCONATE CLOTH 2 % EX PADS: 6.0000 | MEDICATED_PAD | Freq: Once | CUTANEOUS | Status: DC

## 2018-11-06 MED ORDER — SUCCINYLCHOLINE CHLORIDE 200 MG/10ML IV SOSY
PREFILLED_SYRINGE | INTRAVENOUS | Status: AC
  Filled 2018-11-06: qty 10

## 2018-11-06 MED ORDER — ACETAMINOPHEN 10 MG/ML IV SOLN
INTRAVENOUS | Status: AC
  Filled 2018-11-06: qty 100

## 2018-11-06 MED ORDER — FENTANYL CITRATE (PF) 100 MCG/2ML IJ SOLN
INTRAMUSCULAR | Status: DC | PRN
  Administered 2018-11-06: 50 ug via INTRAVENOUS
  Administered 2018-11-06: 100 ug via INTRAVENOUS
  Administered 2018-11-06 (×2): 50 ug via INTRAVENOUS

## 2018-11-06 MED ORDER — ETOMIDATE 2 MG/ML IV SOLN
INTRAVENOUS | Status: AC
  Filled 2018-11-06: qty 20

## 2018-11-06 MED ORDER — OXYCODONE HCL 5 MG PO TABS: 5.0000 mg | ORAL_TABLET | Freq: Once | ORAL | Status: DC | PRN

## 2018-11-06 MED ORDER — OXYCODONE HCL 5 MG PO TABS
5.0000 mg | ORAL_TABLET | ORAL | Status: DC | PRN
  Administered 2018-11-07 – 2018-11-09 (×5): 5 mg via ORAL
  Filled 2018-11-06 (×5): qty 1

## 2018-11-06 MED ORDER — PROPOFOL 10 MG/ML IV BOLUS
INTRAVENOUS | Status: DC | PRN
  Administered 2018-11-06: 150 mg via INTRAVENOUS

## 2018-11-06 MED ORDER — KETOROLAC TROMETHAMINE 30 MG/ML IJ SOLN
INTRAMUSCULAR | Status: DC | PRN
  Administered 2018-11-06: 30 mg via INTRAVENOUS

## 2018-11-06 MED ORDER — METHOCARBAMOL 500 MG PO TABS
500.0000 mg | ORAL_TABLET | Freq: Three times a day (TID) | ORAL | Status: DC
  Administered 2018-11-06 – 2018-11-09 (×8): 500 mg via ORAL
  Filled 2018-11-06 (×8): qty 1

## 2018-11-06 MED ORDER — SUCCINYLCHOLINE CHLORIDE 20 MG/ML IJ SOLN
INTRAMUSCULAR | Status: DC | PRN
  Administered 2018-11-06: 120 mg via INTRAVENOUS

## 2018-11-06 MED ORDER — ONDANSETRON HCL 4 MG/2ML IJ SOLN
INTRAMUSCULAR | Status: AC
  Filled 2018-11-06: qty 4

## 2018-11-06 MED ORDER — MIDAZOLAM HCL 2 MG/2ML IJ SOLN
INTRAMUSCULAR | Status: AC
  Administered 2018-11-06: 2 mg
  Filled 2018-11-06: qty 2

## 2018-11-06 MED ORDER — CELECOXIB 200 MG PO CAPS: 200.0000 mg | ORAL_CAPSULE | ORAL | Status: DC

## 2018-11-06 MED ORDER — HYDROCODONE-ACETAMINOPHEN 5-325 MG PO TABS
1.0000 | ORAL_TABLET | ORAL | Status: DC | PRN
  Filled 2018-11-06: qty 2

## 2018-11-06 MED ORDER — LIDOCAINE 2% (20 MG/ML) 5 ML SYRINGE
INTRAMUSCULAR | Status: AC
  Filled 2018-11-06: qty 10

## 2018-11-06 MED ORDER — GABAPENTIN 300 MG PO CAPS
300.0000 mg | ORAL_CAPSULE | Freq: Two times a day (BID) | ORAL | Status: DC
  Administered 2018-11-06 – 2018-11-09 (×6): 300 mg via ORAL
  Filled 2018-11-06 (×6): qty 1

## 2018-11-06 MED ORDER — ACETAMINOPHEN 500 MG PO TABS
ORAL_TABLET | ORAL | Status: AC
  Administered 2018-11-06: 1000 mg via ORAL
  Filled 2018-11-06: qty 2

## 2018-11-06 MED ORDER — PROMETHAZINE HCL 25 MG/ML IJ SOLN
INTRAMUSCULAR | Status: AC
  Administered 2018-11-06: 6.25 mg via INTRAVENOUS
  Filled 2018-11-06: qty 1

## 2018-11-06 MED ORDER — ONDANSETRON HCL 4 MG/2ML IJ SOLN
INTRAMUSCULAR | Status: DC | PRN
  Administered 2018-11-06: 4 mg via INTRAVENOUS

## 2018-11-06 MED ORDER — GABAPENTIN 300 MG PO CAPS
ORAL_CAPSULE | ORAL | Status: AC
  Administered 2018-11-06: 11:00:00 300 mg via ORAL
  Filled 2018-11-06: qty 1

## 2018-11-06 MED ORDER — ONDANSETRON 4 MG PO TBDP: 4.0000 mg | ORAL_TABLET | Freq: Four times a day (QID) | ORAL | Status: DC | PRN

## 2018-11-06 SURGICAL SUPPLY — 47 items
ADH SKN CLS APL DERMABOND .7 (GAUZE/BANDAGES/DRESSINGS) ×2
APPLIER CLIP 9.375 MED OPEN (MISCELLANEOUS) ×4
APR CLP MED 9.3 20 MLT OPN (MISCELLANEOUS) ×2
BINDER BREAST LRG (GAUZE/BANDAGES/DRESSINGS) IMPLANT
BINDER BREAST XLRG (GAUZE/BANDAGES/DRESSINGS) ×2 IMPLANT
BIOPATCH RED 1 DISK 7.0 (GAUZE/BANDAGES/DRESSINGS) ×4 IMPLANT
BIOPATCH RED 1IN DISK 7.0MM (GAUZE/BANDAGES/DRESSINGS) ×4
CANISTER SUCT 3000ML PPV (MISCELLANEOUS) ×4 IMPLANT
CHLORAPREP W/TINT 26ML (MISCELLANEOUS) ×6 IMPLANT
CLIP APPLIE 9.375 MED OPEN (MISCELLANEOUS) ×2 IMPLANT
COVER SURGICAL LIGHT HANDLE (MISCELLANEOUS) ×4 IMPLANT
COVER WAND RF STERILE (DRAPES) ×2 IMPLANT
DERMABOND ADVANCED (GAUZE/BANDAGES/DRESSINGS) ×2
DERMABOND ADVANCED .7 DNX12 (GAUZE/BANDAGES/DRESSINGS) ×2 IMPLANT
DRAIN CHANNEL 19F RND (DRAIN) ×8 IMPLANT
DRAPE LAPAROSCOPIC ABDOMINAL (DRAPES) ×4 IMPLANT
DRSG PAD ABDOMINAL 8X10 ST (GAUZE/BANDAGES/DRESSINGS) ×8 IMPLANT
DRSG TEGADERM 4X4.75 (GAUZE/BANDAGES/DRESSINGS) ×12 IMPLANT
ELECT BLADE 4.0 EZ CLEAN MEGAD (MISCELLANEOUS) ×4
ELECT REM PT RETURN 9FT ADLT (ELECTROSURGICAL) ×4
ELECTRODE BLDE 4.0 EZ CLN MEGD (MISCELLANEOUS) ×2 IMPLANT
ELECTRODE REM PT RTRN 9FT ADLT (ELECTROSURGICAL) ×2 IMPLANT
EVACUATOR SILICONE 100CC (DRAIN) ×8 IMPLANT
GAUZE SPONGE 4X4 12PLY STRL (GAUZE/BANDAGES/DRESSINGS) ×4 IMPLANT
GLOVE EUDERMIC 7 POWDERFREE (GLOVE) ×4 IMPLANT
GOWN STRL REUS W/ TWL LRG LVL3 (GOWN DISPOSABLE) ×4 IMPLANT
GOWN STRL REUS W/ TWL XL LVL3 (GOWN DISPOSABLE) ×2 IMPLANT
GOWN STRL REUS W/TWL LRG LVL3 (GOWN DISPOSABLE) ×8
GOWN STRL REUS W/TWL XL LVL3 (GOWN DISPOSABLE) ×4
ILLUMINATOR WAVEGUIDE N/F (MISCELLANEOUS) IMPLANT
KIT BASIN OR (CUSTOM PROCEDURE TRAY) ×4 IMPLANT
KIT TURNOVER KIT B (KITS) ×4 IMPLANT
LIGHT WAVEGUIDE WIDE FLAT (MISCELLANEOUS) IMPLANT
NS IRRIG 1000ML POUR BTL (IV SOLUTION) ×4 IMPLANT
PACK GENERAL/GYN (CUSTOM PROCEDURE TRAY) ×4 IMPLANT
PAD ABD 8X10 STRL (GAUZE/BANDAGES/DRESSINGS) ×4 IMPLANT
PAD ARMBOARD 7.5X6 YLW CONV (MISCELLANEOUS) ×4 IMPLANT
PENCIL SMOKE EVACUATOR (MISCELLANEOUS) ×4 IMPLANT
STAPLER VISISTAT 35W (STAPLE) ×2 IMPLANT
SUT ETHILON 3 0 FSL (SUTURE) ×12 IMPLANT
SUT MNCRL AB 4-0 PS2 18 (SUTURE) ×8 IMPLANT
SUT SILK 2 0 PERMA HAND 18 BK (SUTURE) ×4 IMPLANT
SUT SILK 2 0 SH (SUTURE) ×2 IMPLANT
SUT VIC AB 0 CT2 27 (SUTURE) ×4 IMPLANT
SUT VIC AB 3-0 SH 18 (SUTURE) ×8 IMPLANT
TOWEL OR 17X24 6PK STRL BLUE (TOWEL DISPOSABLE) ×4 IMPLANT
TOWEL OR 17X26 10 PK STRL BLUE (TOWEL DISPOSABLE) ×2 IMPLANT

## 2018-11-06 NOTE — Progress Notes (Signed)
Gave report to [facility], RN to provide direct patient care.

## 2018-11-06 NOTE — Anesthesia Preprocedure Evaluation (Signed)
Anesthesia Evaluation  Patient identified by MRN, date of birth, ID band Patient awake    Reviewed: Allergy & Precautions, NPO status , Patient's Chart, lab work & pertinent test results  History of Anesthesia Complications (+) PONV  Airway Mallampati: I  TM Distance: >3 FB Neck ROM: Full    Dental  (+) Teeth Intact, Dental Advisory Given   Pulmonary    breath sounds clear to auscultation       Cardiovascular negative cardio ROS   Rhythm:Regular Rate:Normal     Neuro/Psych  Headaches,    GI/Hepatic negative GI ROS, Neg liver ROS,   Endo/Other  negative endocrine ROS  Renal/GU negative Renal ROS     Musculoskeletal negative musculoskeletal ROS (+)   Abdominal Normal abdominal exam  (+) + obese,   Peds  Hematology negative hematology ROS (+)   Anesthesia Other Findings Breast Cancer  Reproductive/Obstetrics                             Anesthesia Physical  Anesthesia Plan  ASA: III  Anesthesia Plan: General   Post-op Pain Management:  Regional for Post-op pain   Induction: Intravenous  PONV Risk Score and Plan: 4 or greater and Ondansetron, Dexamethasone, Midazolam and Scopolamine patch - Pre-op  Airway Management Planned: Oral ETT  Additional Equipment: None  Intra-op Plan:   Post-operative Plan: Extubation in OR  Informed Consent: I have reviewed the patients History and Physical, chart, labs and discussed the procedure including the risks, benefits and alternatives for the proposed anesthesia with the patient or authorized representative who has indicated his/her understanding and acceptance.     Dental advisory given  Plan Discussed with: CRNA  Anesthesia Plan Comments:         Anesthesia Quick Evaluation

## 2018-11-06 NOTE — OR Nursing (Signed)
Port-a-cath removed by Dr. Dalbert Batman at [phone].

## 2018-11-06 NOTE — Progress Notes (Signed)
Dr. Iran Planas contacted, stated no consent needed from her because she is assisting.

## 2018-11-06 NOTE — Anesthesia Procedure Notes (Signed)
Anesthesia Regional Block: Pectoralis block   Pre-Anesthetic Checklist: ,, timeout performed, Correct Patient, Correct Site, Correct Laterality, Correct Procedure, Correct Position, site marked, Risks and benefits discussed,  Surgical consent,  Pre-op evaluation,  At surgeon's request and post-op pain management  Laterality: Right  Prep: chloraprep       Needles:  Injection technique: Single-shot  Needle Type: Stimiplex     Needle Length: 9cm  Needle Gauge: 21     Additional Needles:   Procedures:,,,, ultrasound used (permanent image in chart),,,,  Narrative:  Start time: 11/06/2018 12:28 PM End time: 11/06/2018 12:32 PM Injection made incrementally with aspirations every 5 mL.  Performed by: Personally  Anesthesiologist: Lynda Rainwater, MD

## 2018-11-06 NOTE — Anesthesia Procedure Notes (Signed)
Procedure Name: Intubation Date/Time: 11/06/2018 12:57 PM Performed by: Trinna Post., CRNA Pre-anesthesia Checklist: Patient identified, Emergency Drugs available, Suction available, Patient being monitored and Timeout performed Patient Re-evaluated:Patient Re-evaluated prior to induction Oxygen Delivery Method: Circle system utilized Preoxygenation: Pre-oxygenation with 100% oxygen Induction Type: IV induction, Rapid sequence and Cricoid Pressure applied Laryngoscope Size: Mac and 3 Grade View: Grade I Tube type: Oral Tube size: 7.0 mm Number of attempts: 1 Airway Equipment and Method: Stylet Placement Confirmation: ETT inserted through vocal cords under direct vision,  positive ETCO2 and breath sounds checked- equal and bilateral Secured at: 23 cm Tube secured with: Tape Dental Injury: Teeth and Oropharynx as per pre-operative assessment

## 2018-11-06 NOTE — Op Note (Addendum)
Patient Name:           Holly Wiley   Date of Surgery:        11/06/2018  Pre op Diagnosis:      Invasive cancer right breast, triple negative breast cancer                                      Status post neoadjuvant chemotherapy                                       Port-A-Cath in place                                       History neoadjuvant chemotherapy                                       Macromastia  Post op Diagnosis:    Same  Procedure:                 Remove Port-A-Cath                                      Inject blue dye right breast                                      Right total mastectomy, skin reduction pattern                                      Right axillary deep sentinel lymph node biopsy                                      Left prophylactic total mastectomy, skin reduction pattern  Surgeon:                     Edsel Petrin. Dalbert Batman, M.D., FACS  Assistant:                    Irene Limbo, MD   Indication for Assistant: Macromastia, complex skin reduction protocol, plastic surgical skin revision  Operative Indications:   This is a very pleasant 43 year old female who is brought to the operating room for surgical management of her right breast cancer. 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 Dr. Lindi Adie is her oncologist. Billy Fischer, NP is her primary care provider in [place]. Dr. Iran Planas is her plastic surgeon.     She originally presented in January when she thought she felt a small mass in the upper outer quadrant of her right breast. Imaging studies showed a 1.1 cm mass in the right breast 10 o'clock position, 12 cm from the nipple. Axillary ultrasound was negative. Biopsy of the breast mass revealed a grade 3, triple negative breast cancer. Invasive  ductal carcinoma. Ki-67 40%. MRI showed a second area in the right breast which was not biopsied because she requested mastectomy. MRI showed an  area of distortion in the left breast which was biopsied showed fibrocystic changes and was thought to be concordant. She has macromastia. She wants bilateral mastectomies. Dr. Iran Planas has seen her. Genetic testing is negative for breast related deleterious mutations.      Comorbidities include BMI 31.       Family history reveals mother had bilateral mastectomies by me with Port-A-Cath and chemotherapy and has come with the patient a couple of times. A maternal aunt and a second cousin had breast cancer. Again genetic testing is negative for deleterious mutation for breast or ovarian cancer       She received most of her chemotherapy but not all. She had severe side effects. MRI was done which shows near complete response and no adenopathy. I have discussed her care with Dr. Lindi Adie     The working plan is to proceed with Port-A-Cath removal, right skin reduction mastectomy with right sentinel lymph node biopsy, left prophylactic skin reduction mastectomies, plastic surgery to try to reduce the expected lateral chest wall redundancy and avoid ischemic skin flaps.  Delayed reconstruction is being contemplated by the patient. Immediate reconstruction is not felt to be wise considering the macromastia. I discussed the indications, details, techniques, and numerous risk of the surgery with her. She is aware of the risk of bleeding, infection, skin flap necrosis, return to OR for complications, skin redundancy, shoulder disability, chronic pain, and other unforeseen problems. She understands these issues well. All questions are answered. She agrees with this plan  Operative Findings:       The skin incisions were a large triangular anchor type incisions which included excision of the nipple and areolar complexes.  This resulted in a transverse closure on each side with a superiorly placed vertical incision as well.  On the right side upper outer quadrant I got close to the skin a couple of times  and simply excised and ellipsed out the skin and dermis and then closed that site primarily to avoid ischemic change.  In the right axilla I removed 4 or 5 sentinel lymph nodes and sent those as a separate specimen.  Port-A-Cath excision was uneventful.  Procedure in Detail:         Following the induction of general endotracheal anesthesia a surgical timeout and intravenous antibiotics were given.  Following alcohol prep I injected 5 cc of blue dye into the right breast subareolar area.  I massaged the right breast for a few minutes.  We then prepped and draped the neck and entire anterior and lateral chest walls bilaterally.      The Port-A-Cath site was identified in the right infraclavicular area.  A transverse incision was made and dissection carried down to the port.  All 3 Prolene sutures were cut and removed.  The port and catheter were removed intact.  There was no bleeding or infection.  Subcutaneous tissue was closed with 3-0 Vicryl sutures and the skin closed with a running subcuticular 4-0 Monocryl and Dermabond.     Dr. Iran Planas then planned and drew  large triangular incisions to include the nipple and areolar complexes.     The left-sided incision was made first.  The breast were quite large and this was extensive incision making requiring multiple repositionings of the breast.  Skin flaps were raised medially to the parasternal area, superiorly to the  infraclavicular area, laterally to the anterior border of the latissimus dorsi muscle and inferiorly to the anterior rectus sheath.  The breast tissue and overlying triangular skin envelope was dissected off of the pectoralis major and minor muscles.  The lateral skin of the left mastectomy specimen was marked with a silk suture.  The left mastectomy was sent as a separate specimen to the lab.  Hemostasis was excellent and achieved with electrocautery.  The wound was irrigated with saline.  Two 15 French Blake drains were placed, one up into the  axilla and one across the skin flaps.  These were brought out through separate stab incisions inferiorly and sutured to the skin with nylon sutures and connected to suction bulbs.  Dr. Iran Planas de-epithelialized the lower flap and then closed the wound transversely in layers .  and there was a superiorly placed incision as well which joined the transverse incision.  These were closed in layers with 3-0 Vicryl and 4-0 Monocryl and Tegaderm.     Attention was turned to the right side where a mirror image large triangular incision was made.  Technique was similar.  Skin flaps were raised medially, superiorly, inferiorly, and laterally in similar fashion.  We dissected up into the axilla and used the neoprobe to identify sentinel nodes.  I found 4 or 5 sentinel nodes which were very hot and somewhat blue.  Hemostasis in the axilla required some control with suture ligature and metal clips.  The right breast was then dissected off of the pectoralis major and minor muscles.  The lateral skin of the right breast was marked with a silk suture.  The breast specimen and the sentinel nodes were sent to the pathology lab with the appropriate history attached.  Right mastectomy wound was irrigated.  Hemostasis was excellent.  2 drains were placed on the right side as well.  Dr. Iran Planas closed the reduction mammoplasty incision in similar fashion as the left side.    Dry bandages and a breast binder were placed.  The patient tolerated the procedure well and was taken to PACU in stable condition.  EBL 200 cc.  Counts correct.  Complications none.    Addendum: I logged onto the PMP aware website and reviewed her prescription medication history     Desiray Orchard M. Dalbert Batman, M.D., FACS General and Minimally Invasive Surgery Breast and Colorectal Surgery  11/06/2018 4:11 PM

## 2018-11-06 NOTE — Interval H&P Note (Signed)
History and Physical Interval Note:  11/06/2018 10:54 AM  Holly Wiley  has presented today for surgery, with the diagnosis of RIGHT BREAST CANCER.  The various methods of treatment have been discussed with the patient and family. After consideration of risks, benefits and other options for treatment, the patient has consented to  Procedure(s): BILATERAL TOTAL MASTECTOMY,right axillary deep sentinel node biopsy, remove port a cath (N/A) as a surgical intervention.  The patient's history has been reviewed, patient examined, no change in status, stable for surgery.  I have reviewed the patient's chart and labs.  Questions were answered to the patient's satisfaction.     Adin Hector

## 2018-11-06 NOTE — Op Note (Signed)
  Addition to Operative Note  I assisted in bilateral mastectomies with markings for skin reduction pattern mastectomies, retraction, and closure wounds.   Please see Dr. Darrel Hoover Operative Note for main procedure.  Patient was marked for skin reduction mastectomy with most superior portion nipple areola marked on breast meridian. Vertical limbs marked by breast displacement and set at11cm length. In supine position, the lateral limbs and inframammary fold for resection marked. A few centimeters of lower pole skin preserved as inferiorly based dermal pedicle. Skin de epithelialized in this area.  During closure, laterally the mastectomy flap over posterior axillary line was advanced anteriorly and the subcutaneous tissue and superficial fascia was secured to serratus and pectoralis muscle with 0-vicryl. The inferiorly based dermal pedicle was secured to chest wall with interrupted 0-vicryl. Skin closure completedwith 3-0 vicryl in dermis and fascial layer, 4-0 monocryl subcuticular and tissue adhesive. Tegaderms applied bilateral, followed by dry dressing and breast binder.  Irene Limbo, MD [facility] Plastic & Reconstructive Surgery [phone], pin [phone]

## 2018-11-07 LAB — CBC
HCT: 29.6 % — ABNORMAL LOW (ref 36.0–46.0)
Hemoglobin: 10 g/dL — ABNORMAL LOW (ref 12.0–15.0)
MCH: 35 pg — ABNORMAL HIGH (ref 26.0–34.0)
MCHC: 33.8 g/dL (ref 30.0–36.0)
MCV: 103.5 fL — ABNORMAL HIGH (ref 80.0–100.0)
Platelets: 137 10*3/uL — ABNORMAL LOW (ref 150–400)
RBC: 2.86 MIL/uL — ABNORMAL LOW (ref 3.87–5.11)
RDW: 12.2 % (ref 11.5–15.5)
WBC: 6.7 10*3/uL (ref 4.0–10.5)
nRBC: 0 % (ref 0.0–0.2)

## 2018-11-07 LAB — BASIC METABOLIC PANEL
Anion gap: 12 (ref 5–15)
BUN: 10 mg/dL (ref 6–20)
CO2: 22 mmol/L (ref 22–32)
Calcium: 8.8 mg/dL — ABNORMAL LOW (ref 8.9–10.3)
Chloride: 104 mmol/L (ref 98–111)
Creatinine, Ser: 0.72 mg/dL (ref 0.44–1.00)
GFR calc Af Amer: >60 mL/min (ref >60)
GFR calc non Af Amer: >60 mL/min (ref >60)
Glucose, Bld: 173 mg/dL — ABNORMAL HIGH (ref 70–99)
Potassium: 3.8 mmol/L (ref 3.5–5.1)
Sodium: 138 mmol/L (ref 135–145)

## 2018-11-07 MED ORDER — PANTOPRAZOLE SODIUM 40 MG PO TBEC
40.0000 mg | DELAYED_RELEASE_TABLET | Freq: Every day | ORAL | Status: DC
  Administered 2018-11-07 – 2018-11-09 (×3): 40 mg via ORAL
  Filled 2018-11-07 (×3): qty 1

## 2018-11-07 MED ORDER — TRAMADOL HCL 50 MG PO TABS
50.0000 mg | ORAL_TABLET | Freq: Four times a day (QID) | ORAL | Status: DC | PRN
  Administered 2018-11-07: 50 mg via ORAL
  Administered 2018-11-08 – 2018-11-09 (×5): 100 mg via ORAL
  Filled 2018-11-07 (×4): qty 2
  Filled 2018-11-07: qty 1
  Filled 2018-11-07: qty 2

## 2018-11-07 NOTE — Plan of Care (Signed)

## 2018-11-07 NOTE — Progress Notes (Addendum)
1 Day Post-Op  Subjective: Had a lot of pain last night.  Pain meds adjusted by on-call physician and she feels better now.  Ambulating in hall.  She does not feel ready to go home today and wants to get her pain under better control.  No nausea or vomiting. Output left-sided drains 185 cc.  Output right-sided drains 224 cc. Both sides a little bit bloody. Hemoglobin 10.0.  Was 12.7 preop.  Considering hydration and intraoperative blood loss this seems appropriate.  I estimate blood loss 250 cc.   Objective: Vital signs in last 24 hours: Temp:  [97.2 F (36.2 C)-99.1 F (37.3 C)] 97.7 F (36.5 C) (05/09 0132) Pulse Rate:  [65-91] 65 (05/09 0132) Resp:  [11-18] 17 (05/09 0132) BP: (97-124)/(59-79) 124/61 (05/09 0132) SpO2:  [99 %-100 %] 99 % (05/09 0132) Weight:  [89.7 kg] 89.7 kg (05/08 1122) Last BM Date: 11/06/18  Intake/Output from previous day: 05/08 0701 - 05/09 0700 In: 1813.6 [I.V.:1563.6; IV Piggyback:250] Out: 709 [Urine:200; Drains:409; Blood:100] Intake/Output this shift: No intake/output data recorded.  General appearance: Alert.  Cooperative.  Mild distress.  Ambulating independently.  Skin warm and dry Resp: clear to auscultation bilaterally Chest: Bilateral skin reduction mastectomy scars look good.  Tegaderms in place.  Tissues are quite soft.  Soft tissue redundancy on both sides superiorly, a little bit more on the right but I do not detect any significant hematoma or fluid either in the skin flaps or the axilla.  Some pain in right axilla and a little bit of numbness.  Lab Results:  Results for orders placed or performed during the hospital encounter of 11/06/18 (from the past 24 hour(s))  Pregnancy, urine POC     Status: None   Collection Time: 11/06/18 11:40 AM  Result Value Ref Range   Preg Test, Ur NEGATIVE NEGATIVE  Basic metabolic panel     Status: Abnormal   Collection Time: 11/07/18  2:47 AM  Result Value Ref Range   Sodium 138 135 - 145 mmol/L    Potassium 3.8 3.5 - 5.1 mmol/L   Chloride 104 98 - 111 mmol/L   CO2 22 22 - 32 mmol/L   Glucose, Bld 173 (H) 70 - 99 mg/dL   BUN 10 6 - 20 mg/dL   Creatinine, Ser 0.72 0.44 - 1.00 mg/dL   Calcium 8.8 (L) 8.9 - 10.3 mg/dL   GFR calc non Af Amer >60 >60 mL/min   GFR calc Af Amer >60 >60 mL/min   Anion gap 12 5 - 15  CBC     Status: Abnormal   Collection Time: 11/07/18  2:47 AM  Result Value Ref Range   WBC 6.7 4.0 - 10.5 K/uL   RBC 2.86 (L) 3.87 - 5.11 MIL/uL   Hemoglobin 10.0 (L) 12.0 - 15.0 g/dL   HCT 29.6 (L) 36.0 - 46.0 %   MCV 103.5 (H) 80.0 - 100.0 fL   MCH 35.0 (H) 26.0 - 34.0 pg   MCHC 33.8 30.0 - 36.0 g/dL   RDW 12.2 11.5 - 15.5 %   Platelets 137 (L) 150 - 400 K/uL   nRBC 0.0 0.0 - 0.2 %     Studies/Results: Nm Sentinel Node Inj-no Rpt (breast)  Result Date: 11/06/2018 Sulfur colloid was injected by the nuclear medicine technologist for melanoma sentinel node.    Marland Kitchen acetaminophen  1,000 mg Oral Q6H  . celecoxib  200 mg Oral BID  . gabapentin  300 mg Oral BID  . methocarbamol  500 mg Oral TID  . pantoprazole  40 mg Oral Daily  . senna  1 tablet Oral BID     Assessment/Plan: s/p Procedure(s): BILATERAL TOTAL MASTECTOMY WITH AXILLARY LYMPH NODE DISSECTION Removal Port-A-Cath  POD #1.  Removal of Port-A-Cath.  Bilateral mastectomy.  Right axillary sentinel node biopsy, skin reducing mastectomy incisions Stable Drainage a little bit bloody but volumes do not seem out of the ordinary No hematoma Needs to stay in hospital for pain control Mobilize Check CBC tomorrow Reduce IV fluids  @PROBHOSP @  LOS: 0 days    Adin Hector 11/07/2018  . .prob

## 2018-11-08 DIAGNOSIS — G43909 Migraine, unspecified, not intractable, without status migrainosus: Secondary | ICD-10-CM | POA: Diagnosis present

## 2018-11-08 DIAGNOSIS — Z171 Estrogen receptor negative status [ER-]: Secondary | ICD-10-CM | POA: Diagnosis not present

## 2018-11-08 DIAGNOSIS — Z9221 Personal history of antineoplastic chemotherapy: Secondary | ICD-10-CM | POA: Diagnosis not present

## 2018-11-08 DIAGNOSIS — C50411 Malignant neoplasm of upper-outer quadrant of right female breast: Secondary | ICD-10-CM | POA: Diagnosis present

## 2018-11-08 DIAGNOSIS — K5900 Constipation, unspecified: Secondary | ICD-10-CM | POA: Diagnosis not present

## 2018-11-08 DIAGNOSIS — Z803 Family history of malignant neoplasm of breast: Secondary | ICD-10-CM | POA: Diagnosis not present

## 2018-11-08 DIAGNOSIS — Z452 Encounter for adjustment and management of vascular access device: Secondary | ICD-10-CM | POA: Diagnosis not present

## 2018-11-08 DIAGNOSIS — N62 Hypertrophy of breast: Secondary | ICD-10-CM | POA: Diagnosis present

## 2018-11-08 DIAGNOSIS — G588 Other specified mononeuropathies: Secondary | ICD-10-CM | POA: Diagnosis not present

## 2018-11-08 LAB — CBC
HCT: 25.7 % — ABNORMAL LOW (ref 36.0–46.0)
Hemoglobin: 8.4 g/dL — ABNORMAL LOW (ref 12.0–15.0)
MCH: 34.3 pg — ABNORMAL HIGH (ref 26.0–34.0)
MCHC: 32.7 g/dL (ref 30.0–36.0)
MCV: 104.9 fL — ABNORMAL HIGH (ref 80.0–100.0)
Platelets: 123 10*3/uL — ABNORMAL LOW (ref 150–400)
RBC: 2.45 MIL/uL — ABNORMAL LOW (ref 3.87–5.11)
RDW: 12.5 % (ref 11.5–15.5)
WBC: 4.6 10*3/uL (ref 4.0–10.5)
nRBC: 0 % (ref 0.0–0.2)

## 2018-11-08 MED ORDER — KETOROLAC TROMETHAMINE 60 MG/2ML IM SOLN
60.0000 mg | Freq: Two times a day (BID) | INTRAMUSCULAR | Status: DC
  Administered 2018-11-08 – 2018-11-09 (×3): 60 mg via INTRAMUSCULAR
  Filled 2018-11-08 (×4): qty 2

## 2018-11-08 NOTE — Progress Notes (Signed)
2 Days Post-Op  Subjective: Tearful and having a lot of pain this morning, mostly right side laterally. Says this sometimes feels like a hot poker, comes and goes.  Right lateral chest wall and sometimes down to the elbow.  All JP drains functioning.  Now thin serosanguineous but still moderate output. Hemoglobin has drifted down to 8.4.  Using Dilaudid, sometimes oxycodone, on gabapentin and Celebrex.  Objective: Vital signs in last 24 hours: Temp:  [97.6 F (36.4 C)-98.3 F (36.8 C)] 97.9 F (36.6 C) (05/10 0441) Pulse Rate:  [68-88] 75 (05/10 0441) Resp:  [16-18] 16 (05/10 0441) BP: (111-118)/(49-57) 115/55 (05/10 0441) SpO2:  [98 %-99 %] 99 % (05/10 0441) Last BM Date: 11/06/18  Intake/Output from previous day: 05/09 0701 - 05/10 0700 In: 3999 [P.O.:1200; I.V.:2729] Out: 330 [Drains:330] Intake/Output this shift: No intake/output data recorded.    PE: General appearance: Alert.  Cooperative.    Tearful and moderate distress.  Ambulating independently.  Skin warm and dry Resp: clear to auscultation bilaterally Chest: Bilateral skin reduction mastectomy scars look good.  Tegaderms in place.  Tissues are quite soft.  Soft tissue redundancy on both sides superiorly, a little bit more on the right but I do not detect any significant hematoma or fluid either in the skin flaps or the axilla.  Some pain in right axilla and a little bit of numbness. Palpation of right lateral chest wall was hypersensitive.  Skin flaps viable.  No hematoma in axilla.   Lab Results:  Results for orders placed or performed during the hospital encounter of 11/06/18 (from the past 24 hour(s))  CBC     Status: Abnormal   Collection Time: 11/08/18  2:37 AM  Result Value Ref Range   WBC 4.6 4.0 - 10.5 K/uL   RBC 2.45 (L) 3.87 - 5.11 MIL/uL   Hemoglobin 8.4 (L) 12.0 - 15.0 g/dL   HCT 25.7 (L) 36.0 - 46.0 %   MCV 104.9 (H) 80.0 - 100.0 fL   MCH 34.3 (H) 26.0 - 34.0 pg   MCHC 32.7 30.0 - 36.0 g/dL   RDW 12.5 11.5 - 15.5 %   Platelets 123 (L) 150 - 400 K/uL   nRBC 0.0 0.0 - 0.2 %     Studies/Results: No results found.  Marland Kitchen acetaminophen  1,000 mg Oral Q6H  . gabapentin  300 mg Oral BID  . ketorolac  60 mg Intramuscular BID  . methocarbamol  500 mg Oral TID  . pantoprazole  40 mg Oral Daily  . senna  1 tablet Oral BID     Assessment/Plan: s/p Procedure(s): BILATERAL TOTAL MASTECTOMY WITH AXILLARY LYMPH NODE DISSECTION Removal Port-A-Cath  POD #2.  Removal of Port-A-Cath.  Bilateral mastectomy.  Right axillary sentinel node biopsy, skin reducing mastectomy incisions Stable Pain is a big problem.  This seems neuropathic.  No obvious ischemic changes and no significant hematoma. No evidence of active bleeding. I told her there may be intercosto- brachial cutaneous nerve injury Drainage a little bit bloody but volumes do not seem out of the ordinary  I will discuss wound management with Dr. Iran Planas.   Complex wound closure.  Needs to stay in hospital for pain control.  Discontinue Celebrex.  Add Toradol 60 mg.  Twice daily.  Tylenol around-the-clock.  Dilaudid and oxycodone as needed to continue [facility] Check CBC tomorrow  Pathology should be out Monday or Tuesday.  @PROBHOSP @  LOS: 0 days    Holly Wiley 11/08/2018  . .prob

## 2018-11-08 NOTE — Plan of Care (Signed)
  Problem: Education: Goal: Knowledge of General Education information will improve Description: Including pain rating scale, medication(s)/side effects and non-pharmacologic comfort measures Outcome: Progressing   Problem: Clinical Measurements: Goal: Respiratory complications will improve Outcome: Progressing   Problem: Coping: Goal: Level of anxiety will decrease Outcome: Progressing   

## 2018-11-08 NOTE — Progress Notes (Signed)
  Plastic Surgery  POD# 2 bilateral mastectomies  BP 112/73 (BP Location: Left Arm)   Pulse 76   Temp 97.9 F (36.6 C) (Oral)   Resp 16   Ht 5\' 7"  (1.702 m)   Wt 89.7 kg   SpO2 98%   BMI 30.98 kg/m   JP R 105/90 L 40/95  Notes numbness right upper arm feels like" hot poker" on this side Tolerating diet and ambulating  PE: tegaderms in place incisions dry intact, skin flaps largely viable with only area concern right T junction few mm darker color Expected redundancy soft tissue flaps, soft no hematoma Drains serosanguinous  A/P  Exam chest and soft tissue flaps normal for this time post operative, reviewed skin will contract over several weeks to months. Exquisite pain that it intermittent over right lateral chest and RUE- agree with Dr. 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 In additional mastectomy flaps plicated to chest wall over anterior axillary line.Counseled this will improve over time.  Ok to remove Tegaderms at post op visit. Ok from my perspective to shower once cleared by Dr. Dalbert Batman. Patient does not have scheduled visit in my office- please call if any concerns or questions and we will arrange additional visit with me.  Irene Limbo, MD [facility] [phone], pin [phone]

## 2018-11-08 NOTE — Plan of Care (Signed)

## 2018-11-09 ENCOUNTER — Encounter (HOSPITAL_COMMUNITY): Payer: Self-pay | Admitting: General Surgery

## 2018-11-09 LAB — CBC
HCT: 26.4 % — ABNORMAL LOW (ref 36.0–46.0)
Hemoglobin: 8.7 g/dL — ABNORMAL LOW (ref 12.0–15.0)
MCH: 34.8 pg — ABNORMAL HIGH (ref 26.0–34.0)
MCHC: 33 g/dL (ref 30.0–36.0)
MCV: 105.6 fL — ABNORMAL HIGH (ref 80.0–100.0)
Platelets: 133 10*3/uL — ABNORMAL LOW (ref 150–400)
RBC: 2.5 MIL/uL — ABNORMAL LOW (ref 3.87–5.11)
RDW: 12.2 % (ref 11.5–15.5)
WBC: 3.5 10*3/uL — ABNORMAL LOW (ref 4.0–10.5)
nRBC: 0 % (ref 0.0–0.2)

## 2018-11-09 MED ORDER — GABAPENTIN 300 MG PO CAPS: 300.0000 mg | ORAL_CAPSULE | Freq: Two times a day (BID) | ORAL | 2 refills | Status: DC

## 2018-11-09 MED ORDER — TRAMADOL HCL 50 MG PO TABS: 50.0000 mg | ORAL_TABLET | Freq: Four times a day (QID) | ORAL | 1 refills | Status: DC | PRN

## 2018-11-09 MED ORDER — POLYETHYLENE GLYCOL 3350 17 G PO PACK
17.0000 g | PACK | Freq: Once | ORAL | Status: AC
  Administered 2018-11-09: 17 g via ORAL
  Filled 2018-11-09: qty 1

## 2018-11-09 MED ORDER — ONDANSETRON 4 MG PO TBDP: 4.0000 mg | ORAL_TABLET | Freq: Four times a day (QID) | ORAL | 1 refills | Status: DC | PRN

## 2018-11-09 MED ORDER — OXYCODONE HCL 5 MG PO TABS: 5.0000 mg | ORAL_TABLET | ORAL | 0 refills | Status: DC | PRN

## 2018-11-09 NOTE — Plan of Care (Signed)

## 2018-11-09 NOTE — Transfer of Care (Signed)
Immediate Anesthesia Transfer of Care Note  Patient: Holly Wiley  Procedure(s) Performed: BILATERAL TOTAL MASTECTOMY WITH AXILLARY LYMPH NODE DISSECTION (N/A Breast) Removal Port-A-Cath (N/A Chest)  Patient Location: PACU  Anesthesia Type:General  Level of Consciousness: awake, alert  and oriented  Airway & Oxygen Therapy: Patient Spontanous Breathing and Patient connected to nasal cannula oxygen  Post-op Assessment: Report given to RN and Post -op Vital signs reviewed and stable  Post vital signs: Reviewed and stable  Last Vitals:  Vitals Value Taken Time  BP    Temp    Pulse    Resp    SpO2      Last Pain:  Vitals:   11/09/18 0329  TempSrc: Oral  PainSc:       Patients Stated Pain Goal: 3 (15/94/58 5929)  Complications: No apparent anesthesia complications

## 2018-11-09 NOTE — Discharge Summary (Signed)
Patient ID: Holly Wiley [id number] 43 y.o. 1975/08/20  Admit date: 11/06/2018  Discharge date and time: 11/09/2018  Admitting Physician: Adin Hector  Discharge Physician: Adin Hector  Admission Diagnoses: RIGHT BREAST CANCER  Discharge Diagnoses: Right breast cancer, triple negative                                         Status post neoadjuvant chemotherapy                                          Port-A-Cath in place                                          Macromastia                                          Postop neuropathy  Operations: Procedure(s): Inject blue dye right breast Right total mastectomy, skin reduction pattern Right axillary deep sentinel lymph node biopsy Left prophylactic total mastectomy, skin reduction pattern Removal Port-A-Cath  Admission Condition: good  Discharged Condition: good  Indication for Admission: This is a very pleasant 43 year old female who is brought to the operating room for surgical management of her right breast cancer. 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 Dr. Lindi Adie is her oncologist. Billy Fischer, NP is her primary care provider in [place]. Dr. Iran Planas is her plastic surgeon. She originally presented in January when she thought she felt a small mass in the upper outer quadrant of her right breast. Imaging studies showed a 1.1 cm mass in the right breast 10 o'clock position, 12 cm from the nipple. Axillary ultrasound was negative. Biopsy of the breast mass revealed a grade 3, triple negative breast cancer.Invasive ductal carcinoma. Ki-67 40%. MRI showed a second area in the right breast which was not biopsied because she requested mastectomy. MRI showed an area of distortion in the left breast which was biopsied showed fibrocystic changes and was thought to be concordant. She has macromastia. She wants bilateral mastectomies. Dr. Iran Planas has seen  her. Genetic testing is negative for breast related deleterious mutations. Comorbidities include BMI 31.       Family history reveals mother had bilateral mastectomies by me with Port-A-Cath and chemotherapy and has come with the patient a couple of times. A maternal aunt and a second cousin had breast cancer. Again genetic testing is negative for deleterious mutation for breast or ovarian cancer  She receivedmost of her chemotherapy but not all. She had severe side effects. MRI was done which shows near complete response and no adenopathy. I have discussed her care with Dr. Lindi Adie The working plan is to proceed with Port-A-Cath removal, right skin reduction mastectomy with right sentinel lymph node biopsy, left prophylactic skin reduction mastectomies, plastic surgery to try to reduce the expected lateral chest wall redundancy and avoid ischemic skin flaps.  Delayed reconstruction is being contemplated by the patient. Immediate reconstruction is not felt to be wise considering the macromastia. I discussed the indications, details, techniques, and numerous  risk of the surgery with her. She is aware of the risk of bleeding, infection, skin flap necrosis, return to OR for complications, skin redundancy, shoulder disability, chronic pain, and other unforeseen problems. She understands these issues well. All questions are answered. She agrees with this plan   Hospital Course: On the day of admission the patient was taken to the operating room and underwent bilateral mastectomies with skin reduction pattern, right axillary sentinel node biopsy, removal of Port-A-Cath.  Dr. Rebekah Chesterfield assisted and performed the wound closure in a skin reduction pattern, tacking the lateral skin to the chest wall and serratus to avoid skin redundancy and that went well.      Pathology is pending at the time of discharge      The patient had a lot of problem with pain, more on the right than the left.   This seems to be more near neuropathic pain than anything else, some of this was in the right lateral chest wall very tender and hypersensitive there.  Also some pain and tingling down the right arm to the elbow suggesting irritation of the intercostal brachial cutaneous nerve.  This required 3 days to come under control and required oxycodone, Dilaudid, tramadol, and Neurontin.  She stated that she pretty much weaned off of Dilaudid for the 24 hours prior to discharge but still required the other medications.      Her wounds all look good throughout her hospital course.  A little bit of skin redundancy superiorly as expected.  Slight skin color change laterally on the right but seemed viable.  Drainage was bloody the first 24 hours but this became very serosanguineous and she never developed a hematoma or any active bleeding.  Her hemoglobin did drift down from 12 preop to 8.7 postop.      She felt ready to go home on postop day 2 but it took that long to bring her pain under control.  At that point she was tolerating diet.  Still constipated.  Ambulating in the hall.  Moving her arms around fairly well.  Wounds look good as above.  She was instructed in drain and wound care.  She had great insight due to her background as a Equities trader.      She was given prescriptions for oxycodone, tramadol, Neurontin, and Zofran.  She was given a dose of MiraLAX prior to discharge and she says she has plenty of that at home to take.  She may also use some ibuprofen.  She was advised to take either Tums or omeprazole to counteract the gastric irritability.  I have asked her to return to see me in the office in 5 to 8 days at which time we will take the Tegaderms off and look at the wounds.  Routine drain management.    I will call the pathology report to her when it is available.  Diet and activities discussed.     She will be staying with her mother and father.  Consults: None  Significant Diagnostic Studies:  Surgical pathology.  Blood work.  Treatments: surgery: Detailed above  Disposition: Home  Patient Instructions:  Allergies as of 11/09/2018      Reactions   Bee Venom Anaphylaxis   Dihydroergotamine Anaphylaxis   Imitrex [sumatriptan] Other (See Comments)   SEIZURE-LIKE ACTIVITY   Metoclopramide Other (See Comments)   TACHYCARDIA   Transderm-scop [scopolamine] Other (See Comments)   AGITATION      Medication List    STOP taking these medications  EpiPen 0.3 mg/0.3 mL Soaj injection Generic drug:  EPINEPHrine     TAKE these medications   gabapentin 300 MG capsule Commonly known as:  NEURONTIN Take 1 capsule (300 mg total) by mouth 2 (two) times daily.   ondansetron 4 MG disintegrating tablet Commonly known as:  ZOFRAN-ODT Take 1 tablet (4 mg total) by mouth every 6 (six) hours as needed for nausea.   oxyCODONE 5 MG immediate release tablet Commonly known as:  Oxy IR/ROXICODONE Take 1 tablet (5 mg total) by mouth every 4 (four) hours as needed for moderate pain.   traMADol 50 MG tablet Commonly known as:  ULTRAM Take 1-2 tablets (50-100 mg total) by mouth every 6 (six) hours as needed for moderate pain.       Activity: activity as tolerated Diet: regular diet Wound Care: as directed  Follow-up:  With Dr. Dalbert Batman in 1 week    Addendum: I have logged onto the PMP aware website and reviewed her prescription medication history.  Signed: Edsel Petrin. Dalbert Batman, M.D., FACS General and minimally invasive surgery Breast and Colorectal Surgery  11/09/2018, 7:25 AM

## 2018-11-09 NOTE — Discharge Instructions (Signed)
[facility] °[phone] ° °MASTECTOMY: POST OP INSTRUCTIONS ° °Always review your discharge instruction sheet given to you by the facility where your surgery was performed. °IF YOU HAVE DISABILITY OR FAMILY LEAVE FORMS, YOU MUST BRING THEM TO THE OFFICE FOR PROCESSING.   °DO NOT GIVE THEM TO YOUR DOCTOR. °A prescription for pain medication may be given to you upon discharge.  Take your pain medication as prescribed, if needed.  If narcotic pain medicine is not needed, then you may take acetaminophen (Tylenol) or ibuprofen (Advil) as needed. °1. Take your usually prescribed medications unless otherwise directed. °2. If you need a refill on your pain medication, please contact your pharmacy.  They will contact our office to request authorization.  Prescriptions will not be filled after 5pm or on week-ends. °3. You should follow a light diet the first few days after arrival home, such as soup and crackers, etc.  Resume your normal diet the day after surgery. °4. Most patients will experience some swelling and bruising on the chest and underarm.  Ice packs will help.  Swelling and bruising can take several days to resolve.  °5. It is common to experience some constipation if taking pain medication after surgery.  Increasing fluid intake and taking a stool softener (such as Colace) will usually help or prevent this problem from occurring.  A mild laxative (Milk of Magnesia or Miralax) should be taken according to package instructions if there are no bowel movements after 48 hours. °6. Unless discharge instructions indicate otherwise, leave your bandage dry and in place until your next appointment in 3-5 days.  You may take a limited sponge bath.  No tube baths or showers until the drains are removed.  You may have steri-strips (small skin tapes) in place directly over the incision.  These strips should be left on the skin for 7-10 days.  If your surgeon used skin glue on the incision, you may  shower in 24 hours.  The glue will flake off over the next 2-3 weeks.  Any sutures or staples will be removed at the office during your follow-up visit. °7. DRAINS:  If you have drains in place, it is important to keep a list of the amount of drainage produced each day in your drains.  Before leaving the hospital, you should be instructed on drain care.  Call our office if you have any questions about your drains. °8. ACTIVITIES:  You may resume regular (light) daily activities beginning the next day--such as daily self-care, walking, climbing stairs--gradually increasing activities as tolerated.  You may have sexual intercourse when it is comfortable.  Refrain from any heavy lifting or straining until approved by your doctor. °a. You may drive when you are no longer taking prescription pain medication, you can comfortably wear a seatbelt, and you can safely maneuver your car and apply brakes. °b. RETURN TO WORK:  __________________________________________________________ °9. You should see your doctor in the office for a follow-up appointment approximately 3-5 days after your surgery.  Your doctor’s nurse will typically make your follow-up appointment when she calls you with your pathology report.  Expect your pathology report 2-3 business days after your surgery.  You may call to check if you do not hear from us after three days.   °10. OTHER INSTRUCTIONS: ______________________________________________________________________________________________ ____________________________________________________________________________________________ °WHEN TO CALL YOUR DOCTOR: °1. Fever over 101.0 °2. Nausea and/or vomiting °3. Extreme swelling or bruising °4. Continued bleeding from incision. °5. Increased pain, redness, or drainage from the incision. °  The clinic staff is available to answer your questions during regular business hours.  Please dont hesitate to call and ask to speak to one of the nurses for clinical  concerns.  If you have a medical emergency, go to the nearest emergency room or call 911.  A surgeon from [facility] is always on call at the hospital. [address] [place], [place], [place]  [address] ? [address], [place], [place]   [address] [phone] ? [phone] ? FAX [phone] Web site: [email or web address]          Prescriptions for oxycodone, tramadol, Neurontin, and Zofran have been sent to your pharmacy You may use ibuprofen as needed. Take an antacid such as Tums or omeprazole to avoid gastritis and reflux  Take MiraLAX for constipation, 2 or 3 times a day if needed Stay well-hydrated  See basic pain control advice below.           Managing Your Pain After Surgery Without Opioids    Thank you for participating in our program to help patients manage their pain after surgery without opioids. This is part of our effort to provide you with the best care possible, without exposing you or your family to the risk that opioids pose.  What pain can I expect after surgery? You can expect to have some pain after surgery. This is normal. The pain is typically worse the day after surgery, and quickly begins to get better. Many studies have found that many patients are able to manage their pain after surgery with Over-the-Counter (OTC) medications such as Tylenol and Motrin. If you have a condition that does not allow you to take Tylenol or Motrin, notify your surgical team.  How will I manage my pain? The best strategy for controlling your pain after surgery is around the clock pain control with Tylenol (acetaminophen) and Motrin (ibuprofen or Advil). Alternating these medications with each other allows you to maximize your pain control. In addition to Tylenol and Motrin, you can use heating pads or ice packs on your incisions to help reduce your pain.  How will I alternate your regular strength over-the-counter pain medication? You will take  a dose of pain medication every three hours. ; Start by taking 650 mg of Tylenol (2 pills of 325 mg) ; 3 hours later take 600 mg of Motrin (3 pills of 200 mg) ; 3 hours after taking the Motrin take 650 mg of Tylenol ; 3 hours after that take 600 mg of Motrin.   - 1 -  See example - if your first dose of Tylenol is at 12:00 PM   12:00 PM Tylenol 650 mg (2 pills of 325 mg)  3:00 PM Motrin 600 mg (3 pills of 200 mg)  6:00 PM Tylenol 650 mg (2 pills of 325 mg)  9:00 PM Motrin 600 mg (3 pills of 200 mg)  Continue alternating every 3 hours   We recommend that you follow this schedule around-the-clock for at least 3 days after surgery, or until you feel that it is no longer needed. Use the table on the last page of this handout to keep track of the medications you are taking. Important: Do not take more than 3000mg  of Tylenol or 3200mg  of Motrin in a 24-hour period. Do not take ibuprofen/Motrin if you have a history of bleeding stomach ulcers, severe kidney disease, &/or actively taking a blood thinner  What if I still have pain? If you have pain that  is not controlled with the over-the-counter pain medications (Tylenol and Motrin or Advil) you might have what we call breakthrough pain. You will receive a prescription for a small amount of an opioid pain medication such as Oxycodone, Tramadol, or Tylenol with Codeine. Use these opioid pills in the first 24 hours after surgery if you have breakthrough pain. Do not take more than 1 pill every 4-6 hours.  If you still have uncontrolled pain after using all opioid pills, don't hesitate to call our staff using the number provided. We will help make sure you are managing your pain in the best way possible, and if necessary, we can provide a prescription for additional pain medication.   Day 1    Time  Name of Medication Number of pills taken  Amount of Acetaminophen  Pain Level   Comments  AM PM       AM PM       AM PM       AM PM         AM PM       AM PM       AM PM       AM PM       Total Daily amount of Acetaminophen Do not take more than  3,000 mg per day      Day 2    Time  Name of Medication Number of pills taken  Amount of Acetaminophen  Pain Level   Comments  AM PM       AM PM       AM PM       AM PM       AM PM       AM PM       AM PM       AM PM       Total Daily amount of Acetaminophen Do not take more than  3,000 mg per day      Day 3    Time  Name of Medication Number of pills taken  Amount of Acetaminophen  Pain Level   Comments  AM PM       AM PM       AM PM       AM PM          AM PM       AM PM       AM PM       AM PM       Total Daily amount of Acetaminophen Do not take more than  3,000 mg per day      Day 4    Time  Name of Medication Number of pills taken  Amount of Acetaminophen  Pain Level   Comments  AM PM       AM PM       AM PM       AM PM       AM PM       AM PM       AM PM       AM PM       Total Daily amount of Acetaminophen Do not take more than  3,000 mg per day      Day 5    Time  Name of Medication Number of pills taken  Amount of Acetaminophen  Pain Level   Comments  AM PM       AM PM  AM PM       AM PM       AM PM       AM PM       AM PM       AM PM       Total Daily amount of Acetaminophen Do not take more than  3,000 mg per day       Day 6    Time  Name of Medication Number of pills taken  Amount of Acetaminophen  Pain Level  Comments  AM PM       AM PM       AM PM       AM PM       AM PM       AM PM       AM PM       AM PM       Total Daily amount of Acetaminophen Do not take more than  3,000 mg per day      Day 7    Time  Name of Medication Number of pills taken  Amount of Acetaminophen  Pain Level   Comments  AM PM       AM PM       AM PM       AM PM       AM PM       AM PM       AM PM       AM PM       Total Daily amount of Acetaminophen Do not take more than  3,000 mg per  day        For additional information about how and where to safely dispose of unused opioid medications - [employer]  Disclaimer: This document contains information and/or instructional materials adapted from [place] for the typical patient with your condition. It does not replace medical advice from your health care provider because your experience may differ from that of the typical patient. Talk to your health care provider if you have any questions about this document, your condition or your treatment plan. Adapted from [place]

## 2018-11-09 NOTE — Progress Notes (Signed)
PIV removed, AVS printed, no further questions. No respiratory distress. Pain is tolerable. No further complains from pt.

## 2018-11-10 NOTE — Assessment & Plan Note (Signed)
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 No indication for radiation.  Return to clinic in 3 months for survivorship care plan visit.

## 2018-11-14 NOTE — Progress Notes (Signed)
HEMATOLOGY-ONCOLOGY DOXIMITY VISIT PROGRESS NOTE  I connected with Holly Wiley on 11/16/2018 at  9:30 AM EDT by Doximity video conference and verified that I am speaking with the correct person using two identifiers.  I discussed the limitations, risks, security and privacy concerns of performing an evaluation and management service by Doximity and the availability of in person appointments.  I also discussed with the patient that there may be a patient responsible charge related to this service. The patient expressed understanding and agreed to proceed.  Patient's Location: Home Physician Location: Clinic  CHIEF COMPLIANT: Follow-up s/p bilateral mastectomies to review pathology  INTERVAL HISTORY: Holly Wiley is a 43 y.o. [place] with above-mentioned history of right breast cancer who underwent neoadjuvant chemotherapy and bilateral mastectomies on 11/06/18. Pathology confirmed no residual carcinoma and three lymph nodes negative for carcinoma. She presents over Doximity today to review the pathology reports.     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, Ki-67 40%, T1c N0 stage Ib    06/29/2018 Cancer Staging    Staging form: Breast, AJCC 8th Edition - Clinical stage from 06/29/2018: Stage IB (cT1c, cN0, cM0, G3, ER-, PR-, HER2-) - Signed by Nicholas Lose, MD on 06/29/2018    07/09/2018 - 10/01/2018 Neo-Adjuvant Chemotherapy    Neo- Adjuvant chemotherapy with dose dense Adriamycin and Cytoxan x4 followed by Taxol and carboplatin weekly x5 (cycles 6-12 canceled due to extreme fatigue)    11/06/2018 Surgery    Left mastectomy: Complete response, 0/3 lymph nodes negative      REVIEW OF SYSTEMS:   Constitutional: Denies fevers, chills or abnormal weight loss Eyes: Denies blurriness of vision Ears, nose, mouth, throat, and face: Denies mucositis or sore throat Respiratory: Denies cough, dyspnea or wheezes Cardiovascular: Denies palpitation, chest discomfort Gastrointestinal:  Denies nausea, heartburn or change in bowel habits Skin: Denies abnormal skin rashes Lymphatics: Denies new lymphadenopathy or easy bruising Neurological:Axillary pain from surgery very severe Behavioral/Psych: Mood is stable, no new changes  Extremities: No lower extremity edema Breast: denies any pain or lumps or nodules in either breasts All other systems were reviewed with the patient and are negative.  Observations/Objective:  There were no vitals filed for this visit. There is no height or weight on file to calculate BMI.   I have reviewed the data as listed CMP Latest Ref Rng & Units 11/07/2018 10/29/2018 10/02/2018  Glucose 70 - 99 mg/dL 173(H) 96 123(H)  BUN 6 - 20 mg/dL '10 12 13  ' Creatinine 0.44 - 1.00 mg/dL 0.72 0.65 0.70  Sodium 135 - 145 mmol/L 138 139 140  Potassium 3.5 - 5.1 mmol/L 3.8 4.0 3.3(L)  Chloride 98 - 111 mmol/L 104 106 109  CO2 22 - 32 mmol/L '22 26 23  ' Calcium 8.9 - 10.3 mg/dL 8.8(L) 9.1 8.5(L)  Total Protein 6.5 - 8.1 g/dL - 6.3(L) 6.3(L)  Total Bilirubin 0.3 - 1.2 mg/dL - 0.5 0.4  Alkaline Phos 38 - 126 U/L - 65 59  AST 15 - 41 U/L - 23 17  ALT 0 - 44 U/L - 28 36    Lab Results  Component Value Date   WBC 3.5 (L) 11/09/2018  HGB 8.7 (L) 11/09/2018   HCT 26.4 (L) 11/09/2018   MCV 105.6 (H) 11/09/2018   PLT 133 (L) 11/09/2018   NEUTROABS 1.3 (L) 10/29/2018      Assessment Plan:  Malignant neoplasm of upper-outer quadrant of right breast in female, estrogen receptor negative ([place]) 06/22/2018:Screening mammogram detected right breast mass 1.1 cm at 10 o'clock position right breast 12 cm from the nipple, no abnormal lymph nodes, biopsy of the  mass revealed IDC with DCIS grade 3, ER 0%, PR 0%, 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 No indication for radiation.  Severe intractable Axillary Pain: on Gabapentin. Nerve Block is being considered. Patient also takes tramadol. Trying to not take Oxycodone.(causes Nausea)  Return to clinic in 3 months for survivorship care plan visit.  I discussed the assessment and treatment plan with the patient. The patient was provided an opportunity to ask questions and all were answered. The patient agreed with the plan and demonstrated an understanding of the instructions. The patient was advised to call back or seek an in-person evaluation if the symptoms worsen or if the condition fails to improve as anticipated.    Rulon Eisenmenger, MD 11/16/2018   I, Molly Dorshimer, am acting as scribe for Nicholas Lose, MD.  I have reviewed the above documentation for accuracy and completeness, and I agree with the above.

## 2018-11-16 ENCOUNTER — Inpatient Hospital Stay: Payer: BLUE CROSS/BLUE SHIELD | Attending: Hematology and Oncology | Admitting: Hematology and Oncology

## 2018-11-16 DIAGNOSIS — Z171 Estrogen receptor negative status [ER-]: Secondary | ICD-10-CM | POA: Diagnosis not present

## 2018-11-16 DIAGNOSIS — Z9221 Personal history of antineoplastic chemotherapy: Secondary | ICD-10-CM

## 2018-11-16 DIAGNOSIS — C50411 Malignant neoplasm of upper-outer quadrant of right female breast: Secondary | ICD-10-CM | POA: Diagnosis not present

## 2018-11-16 DIAGNOSIS — Z9012 Acquired absence of left breast and nipple: Secondary | ICD-10-CM | POA: Diagnosis not present

## 2018-11-18 ENCOUNTER — Telehealth: Payer: Self-pay | Admitting: Hematology and Oncology

## 2018-11-18 NOTE — Telephone Encounter (Signed)
Called regarding schedule °

## 2018-11-19 ENCOUNTER — Encounter: Payer: Self-pay | Admitting: *Deleted

## 2018-11-24 NOTE — Anesthesia Postprocedure Evaluation (Signed)
Anesthesia Post Note  Patient: Holly Wiley  Procedure(s) Performed: BILATERAL TOTAL MASTECTOMY WITH AXILLARY LYMPH NODE DISSECTION (N/A Breast) Removal Port-A-Cath (N/A Chest)     Patient location during evaluation: PACU Anesthesia Type: General Level of consciousness: awake and alert Pain management: pain level controlled Vital Signs Assessment: post-procedure vital signs reviewed and stable Respiratory status: spontaneous breathing, nonlabored ventilation and respiratory function stable Cardiovascular status: blood pressure returned to baseline and stable Postop Assessment: no apparent nausea or vomiting Anesthetic complications: no    Last Vitals:  Vitals:   11/08/18 1935 11/09/18 0329  BP: (!) 119/56 120/61  Pulse: 75 84  Resp: 15 16  Temp: 36.6 C 36.7 C  SpO2: 100% 95%    Last Pain:  Vitals:   11/09/18 1015  TempSrc:   PainSc: [place]

## 2018-11-25 ENCOUNTER — Other Ambulatory Visit: Payer: Self-pay

## 2018-11-25 ENCOUNTER — Inpatient Hospital Stay (HOSPITAL_COMMUNITY)
Admission: EM | Admit: 2018-11-25 | Discharge: 2018-12-02 | DRG: 858 | Disposition: A | Discharge status: Home Health | Payer: BLUE CROSS/BLUE SHIELD | Attending: General Surgery | Admitting: General Surgery

## 2018-11-25 ENCOUNTER — Encounter (HOSPITAL_COMMUNITY): Payer: Self-pay | Admitting: Emergency Medicine

## 2018-11-25 DIAGNOSIS — C50411 Malignant neoplasm of upper-outer quadrant of right female breast: Secondary | ICD-10-CM | POA: Diagnosis present

## 2018-11-25 DIAGNOSIS — Z9221 Personal history of antineoplastic chemotherapy: Secondary | ICD-10-CM

## 2018-11-25 DIAGNOSIS — B9561 Methicillin susceptible Staphylococcus aureus infection as the cause of diseases classified elsewhere: Secondary | ICD-10-CM | POA: Diagnosis present

## 2018-11-25 DIAGNOSIS — Z79899 Other long term (current) drug therapy: Secondary | ICD-10-CM

## 2018-11-25 DIAGNOSIS — Z171 Estrogen receptor negative status [ER-]: Secondary | ICD-10-CM

## 2018-11-25 DIAGNOSIS — Z9013 Acquired absence of bilateral breasts and nipples: Secondary | ICD-10-CM

## 2018-11-25 DIAGNOSIS — K5900 Constipation, unspecified: Secondary | ICD-10-CM | POA: Diagnosis present

## 2018-11-25 DIAGNOSIS — F419 Anxiety disorder, unspecified: Secondary | ICD-10-CM | POA: Diagnosis present

## 2018-11-25 DIAGNOSIS — R112 Nausea with vomiting, unspecified: Secondary | ICD-10-CM | POA: Diagnosis present

## 2018-11-25 DIAGNOSIS — T40605A Adverse effect of unspecified narcotics, initial encounter: Secondary | ICD-10-CM | POA: Diagnosis not present

## 2018-11-25 DIAGNOSIS — Y92239 Unspecified place in hospital as the place of occurrence of the external cause: Secondary | ICD-10-CM | POA: Diagnosis not present

## 2018-11-25 DIAGNOSIS — T8141XA Infection following a procedure, superficial incisional surgical site, initial encounter: Secondary | ICD-10-CM | POA: Diagnosis not present

## 2018-11-25 DIAGNOSIS — Z800 Family history of malignant neoplasm of digestive organs: Secondary | ICD-10-CM

## 2018-11-25 DIAGNOSIS — E876 Hypokalemia: Secondary | ICD-10-CM | POA: Diagnosis present

## 2018-11-25 DIAGNOSIS — Z8052 Family history of malignant neoplasm of bladder: Secondary | ICD-10-CM

## 2018-11-25 DIAGNOSIS — Z833 Family history of diabetes mellitus: Secondary | ICD-10-CM

## 2018-11-25 DIAGNOSIS — Z91030 Bee allergy status: Secondary | ICD-10-CM

## 2018-11-25 DIAGNOSIS — Z95828 Presence of other vascular implants and grafts: Secondary | ICD-10-CM

## 2018-11-25 DIAGNOSIS — R630 Anorexia: Secondary | ICD-10-CM | POA: Diagnosis present

## 2018-11-25 DIAGNOSIS — Y838 Other surgical procedures as the cause of abnormal reaction of the patient, or of later complication, without mention of misadventure at the time of the procedure: Secondary | ICD-10-CM | POA: Diagnosis present

## 2018-11-25 DIAGNOSIS — T8149XA Infection following a procedure, other surgical site, initial encounter: Secondary | ICD-10-CM

## 2018-11-25 DIAGNOSIS — N62 Hypertrophy of breast: Secondary | ICD-10-CM | POA: Diagnosis present

## 2018-11-25 DIAGNOSIS — N61 Mastitis without abscess: Secondary | ICD-10-CM | POA: Diagnosis present

## 2018-11-25 DIAGNOSIS — Z79891 Long term (current) use of opiate analgesic: Secondary | ICD-10-CM

## 2018-11-25 DIAGNOSIS — Z803 Family history of malignant neoplasm of breast: Secondary | ICD-10-CM

## 2018-11-25 DIAGNOSIS — Z823 Family history of stroke: Secondary | ICD-10-CM

## 2018-11-25 DIAGNOSIS — Z5181 Encounter for therapeutic drug level monitoring: Secondary | ICD-10-CM

## 2018-11-25 DIAGNOSIS — G5691 Unspecified mononeuropathy of right upper limb: Secondary | ICD-10-CM | POA: Diagnosis present

## 2018-11-25 DIAGNOSIS — Z888 Allergy status to other drugs, medicaments and biological substances status: Secondary | ICD-10-CM

## 2018-11-25 DIAGNOSIS — Z20828 Contact with and (suspected) exposure to other viral communicable diseases: Secondary | ICD-10-CM | POA: Diagnosis present

## 2018-11-25 DIAGNOSIS — C50919 Malignant neoplasm of unspecified site of unspecified female breast: Secondary | ICD-10-CM | POA: Diagnosis present

## 2018-11-25 DIAGNOSIS — R509 Fever, unspecified: Secondary | ICD-10-CM

## 2018-11-25 LAB — URINALYSIS, ROUTINE W REFLEX MICROSCOPIC
Bacteria, UA: NONE SEEN
Bilirubin Urine: NEGATIVE
Glucose, UA: NEGATIVE mg/dL
Hgb urine dipstick: NEGATIVE
Ketones, ur: NEGATIVE mg/dL
Nitrite: NEGATIVE
Protein, ur: NEGATIVE mg/dL
Specific Gravity, Urine: 1.013 (ref 1.005–1.030)
pH: 6 (ref 5.0–8.0)

## 2018-11-25 LAB — COMPREHENSIVE METABOLIC PANEL
ALT: 25 U/L (ref 0–44)
AST: 22 U/L (ref 15–41)
Albumin: 3.8 g/dL (ref 3.5–5.0)
Alkaline Phosphatase: 67 U/L (ref 38–126)
Anion gap: 9 (ref 5–15)
BUN: 13 mg/dL (ref 6–20)
CO2: 26 mmol/L (ref 22–32)
Calcium: 9.5 mg/dL (ref 8.9–10.3)
Chloride: 102 mmol/L (ref 98–111)
Creatinine, Ser: 0.74 mg/dL (ref 0.44–1.00)
GFR calc Af Amer: >60 mL/min (ref >60)
GFR calc non Af Amer: >60 mL/min (ref >60)
Glucose, Bld: 115 mg/dL — ABNORMAL HIGH (ref 70–99)
Potassium: 3.8 mmol/L (ref 3.5–5.1)
Sodium: 137 mmol/L (ref 135–145)
Total Bilirubin: 0.4 mg/dL (ref 0.3–1.2)
Total Protein: 6.9 g/dL (ref 6.5–8.1)

## 2018-11-25 LAB — CBC WITH DIFFERENTIAL/PLATELET
Abs Immature Granulocytes: 0.02 10*3/uL (ref 0.00–0.07)
Basophils Absolute: 0 10*3/uL (ref 0.0–0.1)
Basophils Relative: 1 %
Eosinophils Absolute: 0.3 10*3/uL (ref 0.0–0.5)
Eosinophils Relative: 3 %
HCT: 35.4 % — ABNORMAL LOW (ref 36.0–46.0)
Hemoglobin: 12 g/dL (ref 12.0–15.0)
Immature Granulocytes: 0 %
Lymphocytes Relative: 13 %
Lymphs Abs: 1.1 10*3/uL (ref 0.7–4.0)
MCH: 34 pg (ref 26.0–34.0)
MCHC: 33.9 g/dL (ref 30.0–36.0)
MCV: 100.3 fL — ABNORMAL HIGH (ref 80.0–100.0)
Monocytes Absolute: 0.7 10*3/uL (ref 0.1–1.0)
Monocytes Relative: 8 %
Neutro Abs: 6.2 10*3/uL (ref 1.7–7.7)
Neutrophils Relative %: 75 %
Platelets: 260 10*3/uL (ref 150–400)
RBC: 3.53 MIL/uL — ABNORMAL LOW (ref 3.87–5.11)
RDW: 11.9 % (ref 11.5–15.5)
WBC: 8.4 10*3/uL (ref 4.0–10.5)
nRBC: 0 % (ref 0.0–0.2)

## 2018-11-25 LAB — SARS CORONAVIRUS 2 BY RT PCR (HOSPITAL ORDER, PERFORMED IN ~~LOC~~ HOSPITAL LAB): SARS Coronavirus 2: NEGATIVE

## 2018-11-25 LAB — LACTIC ACID, PLASMA: Lactic Acid, Venous: 1 mmol/L (ref 0.5–1.9)

## 2018-11-25 MED ORDER — SODIUM CHLORIDE 0.9 % IV BOLUS
1000.0000 mL | Freq: Once | INTRAVENOUS | Status: AC
  Administered 2018-11-25: 22:00:00 1000 mL via INTRAVENOUS

## 2018-11-25 MED ORDER — VANCOMYCIN HCL 10 G IV SOLR
1750.0000 mg | Freq: Once | INTRAVENOUS | Status: AC
  Administered 2018-11-26: 01:00:00 1750 mg via INTRAVENOUS
  Filled 2018-11-25: qty 1750

## 2018-11-25 MED ORDER — SODIUM CHLORIDE 0.9 % IV SOLN
2.0000 g | Freq: Once | INTRAVENOUS | Status: AC
  Administered 2018-11-25: 22:00:00 2 g via INTRAVENOUS
  Filled 2018-11-25: qty 2

## 2018-11-25 MED ORDER — HYDROMORPHONE HCL 1 MG/ML IJ SOLN
1.0000 mg | Freq: Once | INTRAMUSCULAR | Status: AC
  Administered 2018-11-25: 22:00:00 1 mg via INTRAVENOUS
  Filled 2018-11-25: qty 1

## 2018-11-25 MED ORDER — ACETAMINOPHEN 325 MG PO TABS
650.0000 mg | ORAL_TABLET | Freq: Once | ORAL | Status: AC
  Administered 2018-11-25: 650 mg via ORAL
  Filled 2018-11-25: qty 2

## 2018-11-25 MED ORDER — METRONIDAZOLE IN NACL 5-0.79 MG/ML-% IV SOLN
500.0000 mg | Freq: Once | INTRAVENOUS | Status: AC
  Administered 2018-11-25: 23:00:00 500 mg via INTRAVENOUS
  Filled 2018-11-25: qty 100

## 2018-11-25 MED ORDER — VANCOMYCIN HCL IN DEXTROSE 1-5 GM/200ML-% IV SOLN: 1000.0000 mg | Freq: Once | INTRAVENOUS | Status: DC

## 2018-11-25 NOTE — ED Provider Notes (Signed)
[facility] EMERGENCY DEPARTMENT Provider Note   CSN: [id number] Arrival date & time: 11/25/18  2042  History   Chief Complaint Chief Complaint  Patient presents with  . Fever    HPI Holly Wiley is a 43 y.o. female with a hx of breast cancer s/p bilateral total mastectomy w/ R axillary lymph node dissection w/ JP drain placement x 4 --11/06/18 who presents to the ED with complaints of fever that began this evening. Patient states she has been having her typical post op pain since discharge from the hospital and having fairly typical drain output. She notes that yesterday one of the drains to the R breast started to have a foul odor w/ thickening of drain output. She started to note some swelling in the area to lateral R breast. Sxs progressively worsened. Today started to developed pain to the swelling site that is severe without alleviating/aggravating factors and this evening spiked a fever w/ temp max of 102, took 1000 mg of tylenol @ w/ improvement in temperature. She called her general surgeon's office and was ultimately instructed to come to the ER for further evaluation. Dr. Dalbert Batman performed her surgery, has follow up appointment scheduled for tomorrow AM. Patient denies URI sxs, cough, dyspnea, pain to chest other than to swollen area, abdominal pain, N/V/D, or dysuria.  Patient underwent adjunctive chemotherapy prior to operation, this was completed 4 weeks prior to surgery.   HPI  Past Medical History:  Diagnosis Date  . Breast cancer ([place]) 07/2018   right  . Dental crown present   . Family history of bladder cancer   . Family history of breast cancer   . Family history of colon cancer   . Family history of pancreatic cancer   . 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  . Migraines   . PONV (postoperative nausea and vomiting)     Patient Active Problem List   Diagnosis Date Noted  . Breast cancer of upper-outer  quadrant of right female breast ([place]) 11/06/2018  . Genetic testing 08/20/2018  . 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 Dehydration 07/24/2018  . Nausea and vomiting 07/24/2018  . Nausea without vomiting 07/24/2018  . Family history of breast cancer   . Family history of colon cancer   . Family history of bladder cancer   . Family history of pancreatic cancer   . Port-A-Cath in place 07/16/2018  . Malignant neoplasm of upper-outer quadrant of right breast in female, estrogen receptor negative ([place]) 06/29/2018  . 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 Macromastia 11/19/2013  . Breast mass, right 10/20/2013  . Abnormal Pap smear 01/01/2012    Past Surgical History:  Procedure Laterality Date  . BILATERAL TOTAL MASTECTOMY WITH AXILLARY LYMPH NODE DISSECTION N/A 11/06/2018   Procedure: BILATERAL TOTAL MASTECTOMY WITH AXILLARY LYMPH NODE DISSECTION;  Surgeon: Fanny Skates, MD;  Location: [place];  Service: General;  Laterality: N/A;  . BREAST BIOPSY Right 11/08/2013   Procedure: EXCISION OF RIGHT  BREAST MASS;  Surgeon: Adin Hector, MD;  Location: [place];  Service: General;  Laterality: Right;  . BUNIONECTOMY Left   . CARPAL TUNNEL RELEASE Right 02/26/2017  . CERVICAL CONE BIOPSY  05/2005  . COLONOSCOPY  2014  . PORT-A-CATH REMOVAL N/A 11/06/2018   Procedure: Removal Port-A-Cath;  Surgeon: Fanny Skates, MD;  Location: [place];  Service: General;  Laterality: N/A;  . PORTACATH PLACEMENT Right 07/08/2018  Procedure: INSERTION PORT-A-CATH;  Surgeon: Fanny Skates, MD;  Location: [facility];  Service: General;  Laterality: Right;  . SHOULDER ARTHROSCOPY W/ LABRAL REPAIR Right   . SHOULDER ARTHROSCOPY W/ ROTATOR CUFF REPAIR Left   . TENOTOMY FOREARM / WRIST Right 02/26/2017     OB History   No obstetric history on file.    Home Medications    Prior to Admission medications   Medication Sig Start Date  End Date Taking? Authorizing Provider  gabapentin (NEURONTIN) 300 MG capsule Take 1 capsule (300 mg total) by mouth 2 (two) times daily. 11/09/18   Fanny Skates, MD  ondansetron (ZOFRAN-ODT) 4 MG disintegrating tablet Take 1 tablet (4 mg total) by mouth every 6 (six) hours as needed for nausea. 11/09/18   Fanny Skates, MD  oxyCODONE (OXY IR/ROXICODONE) 5 MG immediate release tablet Take 1 tablet (5 mg total) by mouth every 4 (four) hours as needed for moderate pain. 11/09/18   Fanny Skates, MD  traMADol (ULTRAM) 50 MG tablet Take 1-2 tablets (50-100 mg total) by mouth every 6 (six) hours as needed for moderate pain. 11/09/18   Fanny Skates, MD    Family History Family History  Problem Relation Age of Onset  . Breast cancer Mother 79       triple negative, GT neg  . Diabetes Maternal Aunt   . Bladder Cancer Maternal Aunt 69  . Colon cancer Maternal Uncle 32  . Diabetes Maternal Uncle   . Diabetes Maternal Grandmother   . Stroke Paternal Grandfather   . Colon cancer Other 53  . Breast cancer Other        dx >50  . Colon cancer Other        dx>50  . Pancreatic cancer Other     Social History Social History   Tobacco Use  . Smoking status: Never Smoker  . Smokeless tobacco: Never Used  Substance Use Topics  . Alcohol use: Yes    Comment: occasionally  . Drug use: No     Allergies   Bee venom; Dihydroergotamine; Imitrex [sumatriptan]; Metoclopramide; and Transderm-scop [scopolamine]   Review of Systems Review of Systems  Constitutional: Positive for chills and fever.  HENT: Negative for congestion, ear pain and sore throat.   Respiratory: Negative for cough and shortness of breath.   Cardiovascular: Negative for chest pain.  Gastrointestinal: Negative for abdominal pain, diarrhea, nausea and vomiting.  Genitourinary: Negative for dysuria.  Skin:       Area of pain & swelling to R lateral chest wall.   All other systems reviewed and are negative.  Physical  Exam Updated Vital Signs BP 115/72 (BP Location: Left Arm)   Pulse (!) 104   Temp 100 F (37.8 C) (Oral) Comment: Simultaneous filing. User may not have seen previous data. Comment (Src): Simultaneous filing. User may not have seen previous data.  Resp 18   Ht 5\' 7"  (1.702 m)   Wt 89.7 kg   SpO2 96%   BMI 30.98 kg/m   Physical Exam Vitals signs and nursing note reviewed. Exam conducted with a chaperone present.  Constitutional:      General: She is not in acute distress.    Appearance: She is well-developed. She is not toxic-appearing.  HENT:     Head: Normocephalic and atraumatic.  Eyes:     General:        Right eye: No discharge.        Left eye: No discharge.  Conjunctiva/sclera: Conjunctivae normal.  Neck:     Musculoskeletal: Neck supple.  Cardiovascular:     Rate and Rhythm: Regular rhythm. Tachycardia present.  Pulmonary:     Effort: Pulmonary effort is normal. No respiratory distress.     Breath sounds: Normal breath sounds. No wheezing, rhonchi or rales.  Chest:       Comments: S/p bilateral mastectomies.  4 JP drains in place- 2 per breast. Drains overall appear to have clear yellow to serosanguinous drainage. One of the R breast drains does have a foul odor coming from it, no significant difference in appearance of the fluid. Surgical scars w/ minimal erythema & scar tissue. No significant purulent drainage.  Abdominal:     General: There is no distension.     Palpations: Abdomen is soft.     Tenderness: There is no abdominal tenderness.  Skin:    General: Skin is warm and dry.     Findings: No rash.  Neurological:     Mental Status: She is alert.     Comments: Clear speech.   Psychiatric:        Behavior: Behavior normal.    ED Treatments / Results  Labs (all labs ordered are listed, but only abnormal results are displayed) Labs Reviewed  COMPREHENSIVE METABOLIC PANEL - Abnormal; Notable for the following components:      Result Value    Glucose, Bld 115 (*)    All other components within normal limits  CBC WITH DIFFERENTIAL/PLATELET - Abnormal; Notable for the following components:   RBC 3.53 (*)    HCT 35.4 (*)    MCV 100.3 (*)    All other components within normal limits  URINALYSIS, ROUTINE W REFLEX MICROSCOPIC - Abnormal; Notable for the following components:   Leukocytes,Ua TRACE (*)    All other components within normal limits  SARS CORONAVIRUS 2 (HOSPITAL ORDER, PERFORMED IN [facility])  CULTURE, BLOOD (ROUTINE X 2)  CULTURE, BLOOD (ROUTINE X 2)  AEROBIC/ANAEROBIC CULTURE (SURGICAL/DEEP WOUND)  LACTIC ACID, PLASMA  LACTIC ACID, PLASMA    EKG EKG Interpretation  Date/Time:  Wednesday Nov 25 2018 22:01:30 EDT Ventricular Rate:  104 PR Interval:    QRS Duration: 90 QT Interval:  357 QTC Calculation: 470 R Axis:   83 Text Interpretation:  Sinus tachycardia Low voltage, precordial leads Confirmed by Dene Gentry [phone]) on 11/25/2018 10:09:55 PM   Radiology No results found.  Procedures Procedures (including critical care time)  Medications Ordered in ED Medications - No data to display  Initial Impression / Assessment and Plan / ED Course  I have reviewed the triage vital signs and the nursing notes.  Pertinent labs & imaging results that were available during my care of the patient were reviewed by me and considered in my medical decision making (see chart for details).   Patient presents to the ED w/ fever, increased pain/swelling to R chest wall just lateral to the breast, and foul odor from JP drain s/p bilateral mastectomies & R sided lymph node dissection 05/08. On exam there is swelling w/ tenderness to palpation to the R chest wall just lateral to the breast. Drain fluid does not appear overly abnormal. Odor noted to one of the R sided drains. On arrival patient oral temp is 100 after 1000 mg of tylenol, she is tachycardic, suspect infection related to mastectomy/lymph node  dissection to R breast- initiated code sepsis w/ IV abx. 1L of fluids ordered, will hold off on 30 cc/kg bolus  at this time as patient is not hypotensive, continue to monitor.   22:56: CONSULT: Discussed w/ general surgeon on call Dr. Dema Severin- recommends CT chest w/ contrast to better assess. Relays if patient requires admission she will be admitted to general surgery service.   Lactic acid: WNL CBC: No leukocytosis. CMP: Glucose 115, no significant abnormalities. Renal function preserved.  UA: Trace leuks- no urinary sxs, doubt UTI.  Covid negative.  EKG w/ sinus tach CT chest pending.   Patient has been evaluated by Dr. Dema Severin in the ER- he has informed patient and nursing staff patient is to be admitted. Progress note has been started, pending bed request.   00:15: Care transitioned to [facility] PA-C at change of shift to follow up on patient disposition.   Final Clinical Impressions(s) / ED Diagnoses   Final diagnoses:  None    ED Discharge Orders    None       Amaryllis Dyke, PA-C 11/26/18 0016    Valarie Merino, MD 12/01/18 1453

## 2018-11-25 NOTE — Progress Notes (Signed)
CC: Possible right chest wall abscess in setting of recent mastectomy  HPI: Holly Wiley is an 43 y.o. 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 Dalbert Batman, with skin reduction pattern (Dr. Iran Planas) 11/06/2018. She has 2 JP drains on each side. She was admitted for 3 days after surgery for pain control issues and neuropathic pain.   She returned to ED today with complaints of temp at home of 102F and significant increase in pain in right chest wall. Describes this as constant and sharp, nonradiating. Nothing makes it better/worse. She reports foul odor from right JP drains but no change in color or character. She denies any other symptoms including nausea/vomiting/weakness.   Past Medical History:  Diagnosis Date  . Breast cancer ([place]) 07/2018   right  . Dental crown present   . Family history of bladder cancer   . Family history of breast cancer   . Family history of colon cancer   . Family history of pancreatic cancer   . 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  . Migraines   . PONV (postoperative nausea and vomiting)     Past Surgical History:  Procedure Laterality Date  . BILATERAL TOTAL MASTECTOMY WITH AXILLARY LYMPH NODE DISSECTION N/A 11/06/2018   Procedure: BILATERAL TOTAL MASTECTOMY WITH AXILLARY LYMPH NODE DISSECTION;  Surgeon: Fanny Skates, MD;  Location: [facility];  Service: General;  Laterality: N/A;  . BREAST BIOPSY Right 11/08/2013   Procedure: EXCISION OF RIGHT  BREAST MASS;  Surgeon: Adin Hector, MD;  Location: [facility];  Service: General;  Laterality: Right;  . BUNIONECTOMY Left   . CARPAL TUNNEL RELEASE Right 02/26/2017  . CERVICAL CONE BIOPSY  05/2005  . COLONOSCOPY  2014  . PORT-A-CATH REMOVAL N/A 11/06/2018   Procedure: Removal Port-A-Cath;  Surgeon: Fanny Skates, MD;  Location: [facility];  Service: General;   Laterality: N/A;  . PORTACATH PLACEMENT Right 07/08/2018   Procedure: INSERTION PORT-A-CATH;  Surgeon: Fanny Skates, MD;  Location: [facility];  Service: General;  Laterality: Right;  . SHOULDER ARTHROSCOPY W/ LABRAL REPAIR Right   . SHOULDER ARTHROSCOPY W/ ROTATOR CUFF REPAIR Left   . TENOTOMY FOREARM / WRIST Right 02/26/2017    Family History  Problem Relation Age of Onset  . Breast cancer Mother 2       triple negative, GT neg  . Diabetes Maternal Aunt   . Bladder Cancer Maternal Aunt 69  . Colon cancer Maternal Uncle 62  . Diabetes Maternal Uncle   . Diabetes Maternal Grandmother   . Stroke Paternal Grandfather   . Colon cancer Other 21  . Breast cancer Other        dx >50  . Colon cancer Other        dx>50  . Pancreatic cancer Other     Social:  reports that she has never smoked. She has never used smokeless tobacco. She reports current alcohol use. She reports that she does not use drugs.  Allergies:  Allergies  Allergen Reactions  . Bee Venom Anaphylaxis  . Dihydroergotamine Anaphylaxis  . Imitrex [Sumatriptan] Other (See Comments)    SEIZURE-LIKE ACTIVITY  . Metoclopramide Other (See Comments)    TACHYCARDIA  . Transderm-Scop [Scopolamine] Other (See Comments)    AGITATION    Medications: I have reviewed the patient's current medications.  Results for orders  placed or performed during the hospital encounter of 11/25/18 (from the past 48 hour(s))  SARS Coronavirus 2 (CEPHEID - Performed in [facility] hospital lab), Hosp Order     Status: None   Collection Time: 11/25/18  9:22 PM  Result Value Ref Range   SARS Coronavirus 2 NEGATIVE NEGATIVE    Comment: (NOTE) If result is NEGATIVE SARS-CoV-2 target nucleic acids are NOT DETECTED. The SARS-CoV-2 RNA is generally detectable in upper and lower  respiratory specimens during the acute phase of infection. The lowest  concentration of SARS-CoV-2 viral copies this assay can detect is 250  copies  / mL. A negative result does not preclude SARS-CoV-2 infection  and should not be used as the sole basis for treatment or other  patient management decisions.  A negative result may occur with  improper specimen collection / handling, submission of specimen other  than nasopharyngeal swab, presence of viral mutation(s) within the  areas targeted by this assay, and inadequate number of viral copies  (<250 copies / mL). A negative result must be combined with clinical  observations, patient history, and epidemiological information. If result is POSITIVE SARS-CoV-2 target nucleic acids are DETECTED. The SARS-CoV-2 RNA is generally detectable in upper and lower  respiratory specimens dur ing the acute phase of infection.  Positive  results are indicative of active infection with SARS-CoV-2.  Clinical  correlation with patient history and other diagnostic information is  necessary to determine patient infection status.  Positive results do  not rule out bacterial infection or co-infection with other viruses. If result is PRESUMPTIVE POSTIVE SARS-CoV-2 nucleic acids MAY BE PRESENT.   A presumptive positive result was obtained on the submitted specimen  and confirmed on repeat testing.  While 2019 novel coronavirus  (SARS-CoV-2) nucleic acids may be present in the submitted sample  additional confirmatory testing may be necessary for epidemiological  and / or clinical management purposes  to differentiate between  SARS-CoV-2 and other Sarbecovirus currently known to infect humans.  If clinically indicated additional testing with an alternate test  methodology [phone]) is advised. The SARS-CoV-2 RNA is generally  detectable in upper and lower respiratory sp ecimens during the acute  phase of infection. The expected result is Negative. Fact Sheet for Patients:  StrictlyIdeas.no Fact Sheet for Healthcare Providers: BankingDealers.co.za This test  is not yet approved or cleared by the [place] FDA and has been authorized for detection and/or diagnosis of SARS-CoV-2 by FDA under an Emergency Use Authorization (EUA).  This EUA will remain in effect (meaning this test can be used) for the duration of the COVID-19 declaration under Section 564(b)(1) of the Act, 21 U.S.C. section 360bbb-3(b)(1), unless the authorization is terminated or revoked sooner. Performed at [facility], [place] [address] [place], [place] [address]   Lactic acid, plasma     Status: None   Collection Time: 11/25/18  9:56 PM  Result Value Ref Range   Lactic Acid, Venous 1.0 0.5 - 1.9 mmol/L    Comment: Performed at [facility] [address] [place], [place] [address]  Comprehensive metabolic panel     Status: Abnormal   Collection Time: 11/25/18  9:56 PM  Result Value Ref Range   Sodium 137 135 - 145 mmol/L   Potassium 3.8 3.5 - 5.1 mmol/L   Chloride 102 98 - 111 mmol/L   CO2 26 22 - 32 mmol/L   Glucose, Bld 115 (H) 70 - 99 mg/dL   BUN 13 6 -  20 mg/dL   Creatinine, Ser 0.74 0.44 - 1.00 mg/dL   Calcium 9.5 8.9 - 10.3 mg/dL   Total Protein 6.9 6.5 - 8.1 g/dL   Albumin 3.8 3.5 - 5.0 g/dL   AST 22 15 - 41 U/L   ALT 25 0 - 44 U/L   Alkaline Phosphatase 67 38 - 126 U/L   Total Bilirubin 0.4 0.3 - 1.2 mg/dL   GFR calc non Af Amer >60 >60 mL/min   GFR calc Af Amer >60 >60 mL/min   Anion gap 9 5 - 15    Comment: Performed at [facility], [place] [address] [place], [place] [address]  CBC WITH DIFFERENTIAL     Status: Abnormal   Collection Time: 11/25/18  9:56 PM  Result Value Ref Range   WBC 8.4 4.0 - 10.5 K/uL   RBC 3.53 (L) 3.87 - 5.11 MIL/uL   Hemoglobin 12.0 12.0 - 15.0 g/dL   HCT 35.4 (L) 36.0 - 46.0 %   MCV 100.3 (H) 80.0 - 100.0 fL   MCH 34.0 26.0 - 34.0 pg   MCHC 33.9 30.0 - 36.0 g/dL   RDW 11.9 11.5 - 15.5 %   Platelets 260 150 - 400 K/uL   nRBC 0.0 0.0 - 0.2 %   Neutrophils Relative % 75 %   Neutro Abs 6.2 1.7 - 7.7  K/uL   Lymphocytes Relative 13 %   Lymphs Abs 1.1 0.7 - 4.0 K/uL   Monocytes Relative 8 %   Monocytes Absolute 0.7 0.1 - 1.0 K/uL   Eosinophils Relative 3 %   Eosinophils Absolute 0.3 0.0 - 0.5 K/uL   Basophils Relative 1 %   Basophils Absolute 0.0 0.0 - 0.1 K/uL   Immature Granulocytes 0 %   Abs Immature Granulocytes 0.02 0.00 - 0.07 K/uL    Comment: Performed at [facility] [address] [place] [address] [place], [place] [address]  Urinalysis, Routine w reflex microscopic     Status: Abnormal   Collection Time: 11/25/18 10:45 PM  Result Value Ref Range   Color, Urine YELLOW YELLOW   APPearance CLEAR CLEAR   Specific Gravity, Urine 1.013 1.005 - 1.030   pH 6.0 5.0 - 8.0   Glucose, UA NEGATIVE NEGATIVE mg/dL   Hgb urine dipstick NEGATIVE NEGATIVE   Bilirubin Urine NEGATIVE NEGATIVE   Ketones, ur NEGATIVE NEGATIVE mg/dL   Protein, ur NEGATIVE NEGATIVE mg/dL   Nitrite NEGATIVE NEGATIVE   Leukocytes,Ua TRACE (A) NEGATIVE   RBC / HPF 0-5 0 - 5 RBC/hpf   WBC, UA 0-5 0 - 5 WBC/hpf   Bacteria, UA NONE SEEN NONE SEEN   Squamous Epithelial / LPF 0-5 0 - 5    Comment: Performed at [facility], [place] [address] [place], [place] [address]    No results found.  ROS - all of the below systems have been reviewed with the patient and positives are indicated with bold text General: chills, fever or night sweats Eyes: blurry vision or double vision ENT: epistaxis or sore throat Allergy/Immunology: itchy/watery eyes or nasal congestion Hematologic/Lymphatic: bleeding problems, blood clots or swollen lymph nodes Endocrine: temperature intolerance or unexpected weight changes Breast: new or changing breast lumps or nipple discharge Resp: cough, shortness of breath, or wheezing CV: chest pain or dyspnea on exertion GI: as per HPI GU: dysuria, trouble voiding, or hematuria MSK: joint pain or joint stiffness Neuro: TIA or stroke symptoms Derm: pruritus and skin lesion changes Psych: anxiety  and  depression  PE Blood pressure 110/75, pulse (!) 105, temperature 99.3 F (37.4 C), temperature source Oral, resp. rate 14, height 5\' 7"  (1.702 m), weight 89.7 kg, SpO2 95 %. Constitutional: NAD; conversant Eyes: Moist conjunctiva Lungs: Normal respiratory effort CV: RRR; GI: Abd soft, NT/ND Skin: Some blanching erythema along right chest wall; incisions intact and without drainage. JPs in place bilaterally with thin serous output. **A chaperone, Mallory Heath, was present for the entire encounter  Results for orders placed or performed during the hospital encounter of 11/25/18 (from the past 48 hour(s))  SARS Coronavirus 2 (CEPHEID - Performed in [facility] hospital lab), Hosp Order     Status: None   Collection Time: 11/25/18  9:22 PM  Result Value Ref Range   SARS Coronavirus 2 NEGATIVE NEGATIVE    Comment: (NOTE) If result is NEGATIVE SARS-CoV-2 target nucleic acids are NOT DETECTED. The SARS-CoV-2 RNA is generally detectable in upper and lower  respiratory specimens during the acute phase of infection. The lowest  concentration of SARS-CoV-2 viral copies this assay can detect is 250  copies / mL. A negative result does not preclude SARS-CoV-2 infection  and should not be used as the sole basis for treatment or other  patient management decisions.  A negative result may occur with  improper specimen collection / handling, submission of specimen other  than nasopharyngeal swab, presence of viral mutation(s) within the  areas targeted by this assay, and inadequate number of viral copies  (<250 copies / mL). A negative result must be combined with clinical  observations, patient history, and epidemiological information. If result is POSITIVE SARS-CoV-2 target nucleic acids are DETECTED. The SARS-CoV-2 RNA is generally detectable in upper and lower  respiratory specimens dur ing the acute phase of infection.  Positive  results are indicative of active infection with  SARS-CoV-2.  Clinical  correlation with patient history and other diagnostic information is  necessary to determine patient infection status.  Positive results do  not rule out bacterial infection or co-infection with other viruses. If result is PRESUMPTIVE POSTIVE SARS-CoV-2 nucleic acids MAY BE PRESENT.   A presumptive positive result was obtained on the submitted specimen  and confirmed on repeat testing.  While 2019 novel coronavirus  (SARS-CoV-2) nucleic acids may be present in the submitted sample  additional confirmatory testing may be necessary for epidemiological  and / or clinical management purposes  to differentiate between  SARS-CoV-2 and other Sarbecovirus currently known to infect humans.  If clinically indicated additional testing with an alternate test  methodology [phone]) is advised. The SARS-CoV-2 RNA is generally  detectable in upper and lower respiratory sp ecimens during the acute  phase of infection. The expected result is Negative. Fact Sheet for Patients:  StrictlyIdeas.no Fact Sheet for Healthcare Providers: BankingDealers.co.za This test is not yet approved or cleared by the [place] FDA and has been authorized for detection and/or diagnosis of SARS-CoV-2 by FDA under an Emergency Use Authorization (EUA).  This EUA will remain in effect (meaning this test can be used) for the duration of the COVID-19 declaration under Section 564(b)(1) of the Act, 21 U.S.C. section 360bbb-3(b)(1), unless the authorization is terminated or revoked sooner. Performed at [facility], [place] [address], [place], [place] [address]   Lactic acid, plasma     Status: None   Collection Time: 11/25/18  9:56 PM  Result Value Ref Range   Lactic Acid, Venous 1.0 0.5 - 1.9 mmol/L    Comment: Performed at  [facility], [place] [address] [place], [place] [address]  Comprehensive metabolic panel     Status: Abnormal    Collection Time: 11/25/18  9:56 PM  Result Value Ref Range   Sodium 137 135 - 145 mmol/L   Potassium 3.8 3.5 - 5.1 mmol/L   Chloride 102 98 - 111 mmol/L   CO2 26 22 - 32 mmol/L   Glucose, Bld 115 (H) 70 - 99 mg/dL   BUN 13 6 - 20 mg/dL   Creatinine, Ser 0.74 0.44 - 1.00 mg/dL   Calcium 9.5 8.9 - 10.3 mg/dL   Total Protein 6.9 6.5 - 8.1 g/dL   Albumin 3.8 3.5 - 5.0 g/dL   AST 22 15 - 41 U/L   ALT 25 0 - 44 U/L   Alkaline Phosphatase 67 38 - 126 U/L   Total Bilirubin 0.4 0.3 - 1.2 mg/dL   GFR calc non Af Amer >60 >60 mL/min   GFR calc Af Amer >60 >60 mL/min   Anion gap 9 5 - 15    Comment: Performed at [facility] [address] [place], [place] [address]  CBC WITH DIFFERENTIAL     Status: Abnormal   Collection Time: 11/25/18  9:56 PM  Result Value Ref Range   WBC 8.4 4.0 - 10.5 K/uL   RBC 3.53 (L) 3.87 - 5.11 MIL/uL   Hemoglobin 12.0 12.0 - 15.0 g/dL   HCT 35.4 (L) 36.0 - 46.0 %   MCV 100.3 (H) 80.0 - 100.0 fL   MCH 34.0 26.0 - 34.0 pg   MCHC 33.9 30.0 - 36.0 g/dL   RDW 11.9 11.5 - 15.5 %   Platelets 260 150 - 400 K/uL   nRBC 0.0 0.0 - 0.2 %   Neutrophils Relative % 75 %   Neutro Abs 6.2 1.7 - 7.7 K/uL   Lymphocytes Relative 13 %   Lymphs Abs 1.1 0.7 - 4.0 K/uL   Monocytes Relative 8 %   Monocytes Absolute 0.7 0.1 - 1.0 K/uL   Eosinophils Relative 3 %   Eosinophils Absolute 0.3 0.0 - 0.5 K/uL   Basophils Relative 1 %   Basophils Absolute 0.0 0.0 - 0.1 K/uL   Immature Granulocytes 0 %   Abs Immature Granulocytes 0.02 0.00 - 0.07 K/uL    Comment: Performed at [facility] [address] [place], [place] [address]  Urinalysis, Routine w reflex microscopic     Status: Abnormal   Collection Time: 11/25/18 10:45 PM  Result Value Ref Range   Color, Urine YELLOW YELLOW   APPearance CLEAR CLEAR   Specific Gravity, Urine 1.013 1.005 - 1.030   pH 6.0 5.0 - 8.0   Glucose, UA NEGATIVE NEGATIVE mg/dL   Hgb urine dipstick NEGATIVE NEGATIVE   Bilirubin Urine  NEGATIVE NEGATIVE   Ketones, ur NEGATIVE NEGATIVE mg/dL   Protein, ur NEGATIVE NEGATIVE mg/dL   Nitrite NEGATIVE NEGATIVE   Leukocytes,Ua TRACE (A) NEGATIVE   RBC / HPF 0-5 0 - 5 RBC/hpf   WBC, UA 0-5 0 - 5 WBC/hpf   Bacteria, UA NONE SEEN NONE SEEN   Squamous Epithelial / LPF 0-5 0 - 5    Comment: Performed at [facility], [place] [address], [place], [place] [address]    No results found.   A/P: Holly Wiley is an 43 y.o. female s/p bilateral TM and R SLNBx 5/8 here with right chest wall pain, cellulitis, possible abscess vs hematoma on right  side  -Given her systemic signs, significant pain associated with this a this occurring rather acutely, will admit and start IV abx -MIVF -NPO -We will discuss her case further with Dr. Dalbert Batman today as to next steps be it with IR for aspiration vs continue course of abx  Sharon Mt. Dema Severin, M.D. [facility]

## 2018-11-25 NOTE — ED Triage Notes (Signed)
Pt here with complaints of a high fever. Pt recently had a double mastectomy done. Pt had jp drains placed. Drains now have a foul cloudy odor to them indicating possible infection. Pt also had a 102 temp at home. Pt took 1000 mg tylenol. Temp now 100.0. pt complains of severe right sided pain that is new.

## 2018-11-26 ENCOUNTER — Emergency Department (HOSPITAL_COMMUNITY): Payer: BLUE CROSS/BLUE SHIELD

## 2018-11-26 ENCOUNTER — Inpatient Hospital Stay (HOSPITAL_COMMUNITY): Payer: BLUE CROSS/BLUE SHIELD

## 2018-11-26 DIAGNOSIS — Z800 Family history of malignant neoplasm of digestive organs: Secondary | ICD-10-CM | POA: Diagnosis not present

## 2018-11-26 DIAGNOSIS — B9561 Methicillin susceptible Staphylococcus aureus infection as the cause of diseases classified elsewhere: Secondary | ICD-10-CM | POA: Diagnosis present

## 2018-11-26 DIAGNOSIS — T8140XA Infection following a procedure, unspecified, initial encounter: Secondary | ICD-10-CM | POA: Diagnosis not present

## 2018-11-26 DIAGNOSIS — B9689 Other specified bacterial agents as the cause of diseases classified elsewhere: Secondary | ICD-10-CM | POA: Diagnosis not present

## 2018-11-26 DIAGNOSIS — Z91030 Bee allergy status: Secondary | ICD-10-CM

## 2018-11-26 DIAGNOSIS — Z9221 Personal history of antineoplastic chemotherapy: Secondary | ICD-10-CM | POA: Diagnosis not present

## 2018-11-26 DIAGNOSIS — L089 Local infection of the skin and subcutaneous tissue, unspecified: Secondary | ICD-10-CM | POA: Diagnosis not present

## 2018-11-26 DIAGNOSIS — Z8052 Family history of malignant neoplasm of bladder: Secondary | ICD-10-CM | POA: Diagnosis not present

## 2018-11-26 DIAGNOSIS — C50919 Malignant neoplasm of unspecified site of unspecified female breast: Secondary | ICD-10-CM | POA: Diagnosis present

## 2018-11-26 DIAGNOSIS — N611 Abscess of the breast and nipple: Secondary | ICD-10-CM | POA: Diagnosis not present

## 2018-11-26 DIAGNOSIS — Z888 Allergy status to other drugs, medicaments and biological substances status: Secondary | ICD-10-CM | POA: Diagnosis not present

## 2018-11-26 DIAGNOSIS — T8149XA Infection following a procedure, other surgical site, initial encounter: Secondary | ICD-10-CM | POA: Diagnosis not present

## 2018-11-26 DIAGNOSIS — Z9011 Acquired absence of right breast and nipple: Secondary | ICD-10-CM | POA: Diagnosis not present

## 2018-11-26 DIAGNOSIS — C50411 Malignant neoplasm of upper-outer quadrant of right female breast: Secondary | ICD-10-CM | POA: Diagnosis present

## 2018-11-26 DIAGNOSIS — E876 Hypokalemia: Secondary | ICD-10-CM | POA: Diagnosis present

## 2018-11-26 DIAGNOSIS — N61 Mastitis without abscess: Secondary | ICD-10-CM | POA: Diagnosis present

## 2018-11-26 DIAGNOSIS — K5900 Constipation, unspecified: Secondary | ICD-10-CM | POA: Diagnosis present

## 2018-11-26 DIAGNOSIS — Y92239 Unspecified place in hospital as the place of occurrence of the external cause: Secondary | ICD-10-CM | POA: Diagnosis not present

## 2018-11-26 DIAGNOSIS — C50911 Malignant neoplasm of unspecified site of right female breast: Secondary | ICD-10-CM | POA: Diagnosis not present

## 2018-11-26 DIAGNOSIS — R6883 Chills (without fever): Secondary | ICD-10-CM | POA: Diagnosis not present

## 2018-11-26 DIAGNOSIS — Z803 Family history of malignant neoplasm of breast: Secondary | ICD-10-CM | POA: Diagnosis not present

## 2018-11-26 DIAGNOSIS — Z171 Estrogen receptor negative status [ER-]: Secondary | ICD-10-CM | POA: Diagnosis not present

## 2018-11-26 DIAGNOSIS — Y838 Other surgical procedures as the cause of abnormal reaction of the patient, or of later complication, without mention of misadventure at the time of the procedure: Secondary | ICD-10-CM | POA: Diagnosis present

## 2018-11-26 DIAGNOSIS — R630 Anorexia: Secondary | ICD-10-CM | POA: Diagnosis present

## 2018-11-26 DIAGNOSIS — Z833 Family history of diabetes mellitus: Secondary | ICD-10-CM | POA: Diagnosis not present

## 2018-11-26 DIAGNOSIS — N62 Hypertrophy of breast: Secondary | ICD-10-CM | POA: Diagnosis present

## 2018-11-26 DIAGNOSIS — G5691 Unspecified mononeuropathy of right upper limb: Secondary | ICD-10-CM | POA: Diagnosis present

## 2018-11-26 DIAGNOSIS — R112 Nausea with vomiting, unspecified: Secondary | ICD-10-CM | POA: Diagnosis not present

## 2018-11-26 DIAGNOSIS — F419 Anxiety disorder, unspecified: Secondary | ICD-10-CM | POA: Diagnosis present

## 2018-11-26 DIAGNOSIS — T40605A Adverse effect of unspecified narcotics, initial encounter: Secondary | ICD-10-CM | POA: Diagnosis not present

## 2018-11-26 DIAGNOSIS — Z9013 Acquired absence of bilateral breasts and nipples: Secondary | ICD-10-CM

## 2018-11-26 DIAGNOSIS — R0789 Other chest pain: Secondary | ICD-10-CM | POA: Diagnosis not present

## 2018-11-26 DIAGNOSIS — Z5181 Encounter for therapeutic drug level monitoring: Secondary | ICD-10-CM | POA: Diagnosis not present

## 2018-11-26 DIAGNOSIS — T8141XA Infection following a procedure, superficial incisional surgical site, initial encounter: Secondary | ICD-10-CM | POA: Diagnosis present

## 2018-11-26 DIAGNOSIS — Z20828 Contact with and (suspected) exposure to other viral communicable diseases: Secondary | ICD-10-CM | POA: Diagnosis present

## 2018-11-26 DIAGNOSIS — Z823 Family history of stroke: Secondary | ICD-10-CM | POA: Diagnosis not present

## 2018-11-26 HISTORY — DX: Infection following a procedure, other surgical site, initial encounter: T81.49XA

## 2018-11-26 LAB — CBC WITH DIFFERENTIAL/PLATELET
Abs Immature Granulocytes: 0.12 10*3/uL — ABNORMAL HIGH (ref 0.00–0.07)
Basophils Absolute: 0.1 10*3/uL (ref 0.0–0.1)
Basophils Relative: 1 %
Eosinophils Absolute: 0.1 10*3/uL (ref 0.0–0.5)
Eosinophils Relative: 1 %
HCT: 27.4 % — ABNORMAL LOW (ref 36.0–46.0)
Hemoglobin: 9.5 g/dL — ABNORMAL LOW (ref 12.0–15.0)
Immature Granulocytes: 1 %
Lymphocytes Relative: 15 %
Lymphs Abs: 1.5 10*3/uL (ref 0.7–4.0)
MCH: 34.4 pg — ABNORMAL HIGH (ref 26.0–34.0)
MCHC: 34.7 g/dL (ref 30.0–36.0)
MCV: 99.3 fL (ref 80.0–100.0)
Monocytes Absolute: 1 10*3/uL (ref 0.1–1.0)
Monocytes Relative: 11 %
Neutro Abs: 7 10*3/uL (ref 1.7–7.7)
Neutrophils Relative %: 71 %
Platelets: 230 10*3/uL (ref 150–400)
RBC: 2.76 MIL/uL — ABNORMAL LOW (ref 3.87–5.11)
RDW: 12 % (ref 11.5–15.5)
WBC: 9.8 10*3/uL (ref 4.0–10.5)
nRBC: 0 % (ref 0.0–0.2)

## 2018-11-26 LAB — PROTIME-INR
INR: 1.1 (ref 0.8–1.2)
Prothrombin Time: 14.2 seconds (ref 11.4–15.2)

## 2018-11-26 LAB — HIV ANTIBODY (ROUTINE TESTING W REFLEX): HIV Screen 4th Generation wRfx: NONREACTIVE

## 2018-11-26 MED ORDER — TRAMADOL HCL 50 MG PO TABS
50.0000 mg | ORAL_TABLET | Freq: Four times a day (QID) | ORAL | Status: DC | PRN
  Administered 2018-11-26 – 2018-12-02 (×10): 100 mg via ORAL
  Filled 2018-11-26 (×10): qty 2

## 2018-11-26 MED ORDER — VANCOMYCIN HCL IN DEXTROSE 1-5 GM/200ML-% IV SOLN
1000.0000 mg | Freq: Once | INTRAVENOUS | Status: DC
  Filled 2018-11-26: qty 200

## 2018-11-26 MED ORDER — VANCOMYCIN HCL 10 G IV SOLR
1250.0000 mg | Freq: Two times a day (BID) | INTRAVENOUS | Status: DC
  Administered 2018-11-26 – 2018-11-29 (×7): 1250 mg via INTRAVENOUS
  Filled 2018-11-26 (×10): qty 1250

## 2018-11-26 MED ORDER — HYDRALAZINE HCL 20 MG/ML IJ SOLN: 10.0000 mg | INTRAMUSCULAR | Status: DC | PRN

## 2018-11-26 MED ORDER — ONDANSETRON HCL 4 MG/2ML IJ SOLN
4.0000 mg | Freq: Once | INTRAMUSCULAR | Status: AC
  Administered 2018-11-26: 01:00:00 4 mg via INTRAVENOUS
  Filled 2018-11-26: qty 2

## 2018-11-26 MED ORDER — MIDAZOLAM HCL 2 MG/2ML IJ SOLN
INTRAMUSCULAR | Status: AC | PRN
  Administered 2018-11-26: 1 mg via INTRAVENOUS

## 2018-11-26 MED ORDER — ACETAMINOPHEN 500 MG PO TABS
500.0000 mg | ORAL_TABLET | Freq: Once | ORAL | Status: AC
  Administered 2018-11-26: 17:00:00 500 mg via ORAL
  Filled 2018-11-26: qty 1

## 2018-11-26 MED ORDER — FENTANYL CITRATE (PF) 100 MCG/2ML IJ SOLN
INTRAMUSCULAR | Status: AC
  Filled 2018-11-26: qty 2

## 2018-11-26 MED ORDER — PIPERACILLIN-TAZOBACTAM 3.375 G IVPB
3.3750 g | Freq: Three times a day (TID) | INTRAVENOUS | Status: DC
  Administered 2018-11-26 (×2): 3.375 g via INTRAVENOUS
  Filled 2018-11-26 (×3): qty 50

## 2018-11-26 MED ORDER — HYDROMORPHONE HCL 1 MG/ML IJ SOLN
0.5000 mg | INTRAMUSCULAR | Status: DC | PRN
  Administered 2018-11-27 (×4): 2 mg via INTRAVENOUS
  Administered 2018-11-28: 03:00:00 1 mg via INTRAVENOUS
  Administered 2018-11-28: 01:00:00 2 mg via INTRAVENOUS
  Administered 2018-11-28: 22:00:00 1 mg via INTRAVENOUS
  Administered 2018-11-28 – 2018-11-29 (×5): 2 mg via INTRAVENOUS
  Administered 2018-11-29: 1 mg via INTRAVENOUS
  Administered 2018-11-29 – 2018-11-30 (×10): 2 mg via INTRAVENOUS
  Administered 2018-12-01 (×5): 1 mg via INTRAVENOUS
  Administered 2018-12-01: 2 mg via INTRAVENOUS
  Administered 2018-12-01 – 2018-12-02 (×2): 1 mg via INTRAVENOUS
  Filled 2018-11-26: qty 2
  Filled 2018-11-26: qty 1
  Filled 2018-11-26 (×2): qty 2
  Filled 2018-11-26 (×2): qty 1
  Filled 2018-11-26: qty 2
  Filled 2018-11-26: qty 1
  Filled 2018-11-26: qty 2
  Filled 2018-11-26 (×2): qty 1
  Filled 2018-11-26 (×2): qty 2
  Filled 2018-11-26 (×2): qty 1
  Filled 2018-11-26: qty 2
  Filled 2018-11-26: qty 1
  Filled 2018-11-26 (×2): qty 2
  Filled 2018-11-26 (×3): qty 1
  Filled 2018-11-26: qty 2
  Filled 2018-11-26 (×2): qty 1
  Filled 2018-11-26 (×10): qty 2

## 2018-11-26 MED ORDER — POTASSIUM CHLORIDE 2 MEQ/ML IV SOLN
INTRAVENOUS | Status: DC
  Administered 2018-11-26 – 2018-11-27 (×2): via INTRAVENOUS
  Filled 2018-11-26 (×7): qty 1000

## 2018-11-26 MED ORDER — MIDAZOLAM HCL 2 MG/2ML IJ SOLN
INTRAMUSCULAR | Status: AC
  Filled 2018-11-26: qty 2

## 2018-11-26 MED ORDER — SIMETHICONE 80 MG PO CHEW: 40.0000 mg | CHEWABLE_TABLET | Freq: Four times a day (QID) | ORAL | Status: DC | PRN

## 2018-11-26 MED ORDER — IOHEXOL 300 MG/ML  SOLN
75.0000 mL | Freq: Once | INTRAMUSCULAR | Status: AC | PRN
  Administered 2018-11-26: 75 mL via INTRAVENOUS

## 2018-11-26 MED ORDER — ONDANSETRON HCL 4 MG/2ML IJ SOLN
4.0000 mg | Freq: Once | INTRAMUSCULAR | Status: AC
  Administered 2018-11-26: 04:00:00 4 mg via INTRAVENOUS

## 2018-11-26 MED ORDER — DOCUSATE SODIUM 100 MG PO CAPS
200.0000 mg | ORAL_CAPSULE | Freq: Two times a day (BID) | ORAL | Status: DC
  Administered 2018-11-26: 22:00:00 200 mg via ORAL
  Filled 2018-11-26: qty 2

## 2018-11-26 MED ORDER — LIDOCAINE HCL (PF) 1 % IJ SOLN
INTRAMUSCULAR | Status: AC
  Filled 2018-11-26: qty 30

## 2018-11-26 MED ORDER — FENTANYL CITRATE (PF) 100 MCG/2ML IJ SOLN
INTRAMUSCULAR | Status: AC | PRN
  Administered 2018-11-26: 50 ug via INTRAVENOUS

## 2018-11-26 MED ORDER — SODIUM CHLORIDE 0.9 % IV SOLN
2.0000 g | Freq: Three times a day (TID) | INTRAVENOUS | Status: DC
  Administered 2018-11-26 – 2018-11-29 (×10): 2 g via INTRAVENOUS
  Filled 2018-11-26 (×14): qty 2

## 2018-11-26 MED ORDER — LACTATED RINGERS IV SOLN
INTRAVENOUS | Status: DC
  Administered 2018-11-26 – 2018-11-27 (×2): via INTRAVENOUS

## 2018-11-26 MED ORDER — ENOXAPARIN SODIUM 40 MG/0.4ML ~~LOC~~ SOLN
40.0000 mg | SUBCUTANEOUS | Status: DC
  Administered 2018-11-27: 06:00:00 40 mg via SUBCUTANEOUS
  Filled 2018-11-26 (×2): qty 0.4

## 2018-11-26 MED ORDER — GABAPENTIN 600 MG PO TABS
900.0000 mg | ORAL_TABLET | Freq: Four times a day (QID) | ORAL | Status: DC
  Administered 2018-11-26 (×3): 900 mg via ORAL
  Administered 2018-11-27: 10:00:00 via ORAL
  Administered 2018-11-27 – 2018-12-02 (×18): 900 mg via ORAL
  Filled 2018-11-26 (×23): qty 2

## 2018-11-26 MED ORDER — ONDANSETRON HCL 4 MG/2ML IJ SOLN
4.0000 mg | Freq: Four times a day (QID) | INTRAMUSCULAR | Status: DC | PRN
  Administered 2018-11-26 – 2018-12-01 (×11): 4 mg via INTRAVENOUS
  Filled 2018-11-26 (×10): qty 2

## 2018-11-26 MED ORDER — ONDANSETRON 4 MG PO TBDP: 4.0000 mg | ORAL_TABLET | Freq: Four times a day (QID) | ORAL | Status: DC | PRN

## 2018-11-26 MED ORDER — DIPHENHYDRAMINE HCL 50 MG/ML IJ SOLN
25.0000 mg | Freq: Once | INTRAMUSCULAR | Status: DC
  Filled 2018-11-26: qty 1

## 2018-11-26 MED ORDER — OXYCODONE HCL 5 MG PO TABS
5.0000 mg | ORAL_TABLET | ORAL | Status: DC | PRN
  Administered 2018-11-26 – 2018-11-27 (×4): 2.5 mg via ORAL
  Administered 2018-11-29 – 2018-12-02 (×8): 5 mg via ORAL
  Filled 2018-11-26 (×12): qty 1

## 2018-11-26 MED ORDER — METHOCARBAMOL 500 MG PO TABS
500.0000 mg | ORAL_TABLET | Freq: Four times a day (QID) | ORAL | Status: DC | PRN
  Administered 2018-11-26 – 2018-12-02 (×7): 500 mg via ORAL
  Filled 2018-11-26 (×9): qty 1

## 2018-11-26 MED ORDER — DIPHENHYDRAMINE HCL 12.5 MG/5ML PO ELIX: 12.5000 mg | ORAL_SOLUTION | Freq: Four times a day (QID) | ORAL | Status: DC | PRN

## 2018-11-26 MED ORDER — DIPHENHYDRAMINE HCL 50 MG/ML IJ SOLN
12.5000 mg | Freq: Four times a day (QID) | INTRAMUSCULAR | Status: DC | PRN
  Administered 2018-11-26 – 2018-12-01 (×7): 12.5 mg via INTRAVENOUS
  Filled 2018-11-26 (×8): qty 1

## 2018-11-26 NOTE — ED Notes (Signed)
Pt complaining of redness and itching to L lower arm proximal to IV site, Vanc was just started; pt denies ever having vanc before; line discontinued and flushed; Claiborne Billings PA to evaluate

## 2018-11-26 NOTE — Progress Notes (Signed)
Called in the pharmacy for Zosyn at 0327am, said will send it, followed up at 0412,said system down, called again at 0603,said will send it now ,so still waiting for the zosyn .

## 2018-11-26 NOTE — Consult Note (Signed)
Chief Complaint: Patient was seen in consultation today for right chest wall fluid collection/aspiration.  Referring Physician(s): Fanny Skates  Supervising Physician: Daryll Brod  Patient Status: [facility] - In-pt  History of Present Illness: Holly Wiley is a 43 y.o. female with a past medical history of migraines, seizure 2014, and breast cancer s/p bilateral total mastectomy (with right axillary lymph note dissection and JP drain placement x4 11/06/2018) who is currently undergoing systemic chemotherapy. She presented to [facility] ED 11/25/2018 with complaint of fever and pain at one of her drains. She called her general surgeon (Dr. Dalbert Batman) who recommended patient immediately go to ED. In ED, CT chest revealed a right anterior chest wall fluid collection. Dr. Dalbert Batman (general surgery) was consulted who recommended ID consult and IR consult for possible aspiration of fluid collection.  CT chest 11/26/2018: 1. Bilateral mastectomies with surgical drains present bilaterally. Hazy inflammatory changes in the subcutaneous fat of the right anterior chest wall with a 2.2 x 5.4 cm complex fluid collection in the surgical bed of the right mastectomy which may reflect a liquified hematoma versus abscess.  IR consulted by Dr. Dalbert Batman for possible image-guided right chest wall fluid collection aspiration and drainage. Patient awake and alert laying in bed. Complains of right chest wall pain (at site of fluid collection), rated 10/10 at this time. Denies fever, chills, chest pain, dyspnea, abdominal pain, or headache.   Past Medical History:  Diagnosis Date  . Breast cancer ([place]) 07/2018   right  . Dental crown present   . Family history of bladder cancer   . Family history of breast cancer   . Family history of colon cancer   . Family history of pancreatic cancer   . 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  . Migraines   . PONV (postoperative nausea and  vomiting)     Past Surgical History:  Procedure Laterality Date  . BILATERAL TOTAL MASTECTOMY WITH AXILLARY LYMPH NODE DISSECTION N/A 11/06/2018   Procedure: BILATERAL TOTAL MASTECTOMY WITH AXILLARY LYMPH NODE DISSECTION;  Surgeon: Fanny Skates, MD;  Location: [place];  Service: General;  Laterality: N/A;  . BREAST BIOPSY Right 11/08/2013   Procedure: EXCISION OF RIGHT  BREAST MASS;  Surgeon: Adin Hector, MD;  Location: [place];  Service: General;  Laterality: Right;  . BUNIONECTOMY Left   . CARPAL TUNNEL RELEASE Right 02/26/2017  . CERVICAL CONE BIOPSY  05/2005  . COLONOSCOPY  2014  . PORT-A-CATH REMOVAL N/A 11/06/2018   Procedure: Removal Port-A-Cath;  Surgeon: Fanny Skates, MD;  Location: [place];  Service: General;  Laterality: N/A;  . PORTACATH PLACEMENT Right 07/08/2018   Procedure: INSERTION PORT-A-CATH;  Surgeon: Fanny Skates, MD;  Location: [place];  Service: General;  Laterality: Right;  . SHOULDER ARTHROSCOPY W/ LABRAL REPAIR Right   . SHOULDER ARTHROSCOPY W/ ROTATOR CUFF REPAIR Left   . TENOTOMY FOREARM / WRIST Right 02/26/2017    Allergies: Bee venom; Dihydroergotamine; Imitrex [sumatriptan]; Metoclopramide; and Transderm-scop [scopolamine]  Medications: Prior to Admission medications   Medication Sig Start Date End Date Taking? Authorizing Provider  gabapentin (NEURONTIN) 600 MG tablet Take 900 mg by mouth 4 (four) times daily. 11/16/18  Yes [provider]  Multiple Vitamin (MULTIVITAMIN WITH MINERALS) TABS tablet Take 1 tablet by mouth daily.   Yes [provider]  ondansetron (ZOFRAN-ODT) 4 MG disintegrating tablet Take 1 tablet (4 mg total) by mouth every 6 (six) hours as needed for  nausea. 11/09/18  Yes Fanny Skates, MD  oxyCODONE (OXY IR/ROXICODONE) 5 MG immediate release tablet Take 1 tablet (5 mg total) by mouth every 4 (four) hours as needed for moderate pain. 11/09/18  Yes Fanny Skates, MD  traMADol  (ULTRAM) 50 MG tablet Take 1-2 tablets (50-100 mg total) by mouth every 6 (six) hours as needed for moderate pain. 11/09/18  Yes Fanny Skates, MD  gabapentin (NEURONTIN) 300 MG capsule Take 1 capsule (300 mg total) by mouth 2 (two) times daily. Patient not taking: Reported on 11/25/2018 11/09/18   Fanny Skates, MD     Family History  Problem Relation Age of Onset  . Breast cancer Mother 75       triple negative, GT neg  . Diabetes Maternal Aunt   . Bladder Cancer Maternal Aunt 69  . Colon cancer Maternal Uncle 22  . Diabetes Maternal Uncle   . Diabetes Maternal Grandmother   . Stroke Paternal Grandfather   . Colon cancer Other 23  . Breast cancer Other        dx >50  . Colon cancer Other        dx>50  . Pancreatic cancer Other     Social History   Socioeconomic History  . Marital status: Divorced    Spouse name: Not on file  . Number of children: Not on file  . Years of education: Not on file  . Highest education level: Not on file  Occupational History  . Not on file  Social Needs  . Financial resource strain: Not on file  . Food insecurity:    Worry: Not on file    Inability: Not on file  . Transportation needs:    Medical: Not on file    Non-medical: Not on file  Tobacco Use  . Smoking status: Never Smoker  . Smokeless tobacco: Never Used  Substance and Sexual Activity  . Alcohol use: Yes    Comment: occasionally  . Drug use: No  . Sexual activity: Not Currently  Lifestyle  . Physical activity:    Days per week: Not on file    Minutes per session: Not on file  . Stress: Not on file  Relationships  . Social connections:    Talks on phone: Not on file    Gets together: Not on file    Attends religious service: Not on file    Active member of club or organization: Not on file    Attends meetings of clubs or organizations: Not on file    Relationship status: Not on file  Other Topics Concern  . Not on file  Social History Narrative  . Not on file      Review of Systems: A 12 point ROS discussed and pertinent positives are indicated in the HPI above.  All other systems are negative.  Review of Systems  Constitutional: Negative for chills and fever.  Respiratory: Negative for shortness of breath and wheezing.   Cardiovascular: Negative for palpitations.       Positive for right chest wall (at site of fluid collection) pain.  Gastrointestinal: Negative for abdominal pain.  Neurological: Negative for headaches.  Psychiatric/Behavioral: Negative for behavioral problems and confusion.    Vital Signs: BP 117/78 (BP Location: Left Arm)   Pulse 98   Temp 98.9 F (37.2 C) (Oral)   Resp 20   Ht 5\' 7"  (1.702 m)   Wt 197 lb 12.8 oz (89.7 kg)   SpO2 93%   BMI 30.98 kg/m  Physical Exam Vitals signs and nursing note reviewed.  Constitutional:      General: She is not in acute distress.    Appearance: Normal appearance.  Cardiovascular:     Rate and Rhythm: Normal rate and regular rhythm.     Heart sounds: Normal heart sounds. No murmur.  Pulmonary:     Effort: Pulmonary effort is normal. No respiratory distress.     Breath sounds: Normal breath sounds. No wheezing.  Skin:    General: Skin is warm and dry.  Neurological:     Mental Status: She is alert and oriented to person, place, and time.  Psychiatric:        Mood and Affect: Mood normal.        Behavior: Behavior normal.        Thought Content: Thought content normal.        Judgment: Judgment normal.      MD Evaluation Airway: WNL Heart: WNL Abdomen: WNL Chest/ Lungs: WNL ASA  Classification: 4 Mallampati/Airway Score: One   Imaging: Ct Chest W Contrast  Result Date: 11/26/2018 CLINICAL DATA:  Status post double mastectomy with axillary lymph node dissection and jp drains w/ foul odor, new swelling to lateral chest wall, febrile. EXAM: CT CHEST WITH CONTRAST TECHNIQUE: Multidetector CT imaging of the chest was performed during intravenous contrast  administration. CONTRAST:  46mL OMNIPAQUE IOHEXOL 300 MG/ML  SOLN COMPARISON:  None. FINDINGS: Cardiovascular: No significant vascular findings. Normal heart size. Small pericardial effusion. Mediastinum/Nodes: No enlarged mediastinal, hilar, or axillary lymph nodes. Thyroid gland, trachea, and esophagus demonstrate no significant findings. Lungs/Pleura: Lungs are clear. No pleural effusion or pneumothorax. Upper Abdomen: No acute abnormality. Musculoskeletal: No acute osseous abnormality. No aggressive osseous lesion. Other: Bilateral mastectomies. Surgical drains are present in the surgical bed of the right and left mastectomy. Hazy inflammatory changes in the subcutaneous fat of the right anterior chest wall with a 2.2 x 5.4 cm complex fluid collection in the surgical bed of the right mastectomy which may reflect a liquified hematoma versus abscess. IMPRESSION: 1. Bilateral mastectomies with surgical drains present bilaterally. Hazy inflammatory changes in the subcutaneous fat of the right anterior chest wall with a 2.2 x 5.4 cm complex fluid collection in the surgical bed of the right mastectomy which may reflect a liquified hematoma versus abscess. Electronically Signed   By: Kathreen Devoid   On: 11/26/2018 00:45   Nm Sentinel Node Inj-no Rpt (breast)  Result Date: 11/06/2018 Sulfur colloid was injected by the nuclear medicine technologist for melanoma sentinel node.    Labs:  CBC: Recent Labs    11/08/18 0237 11/09/18 0349 11/25/18 2156 11/26/18 0357  WBC 4.6 3.5* 8.4 9.8  HGB 8.4* 8.7* 12.0 9.5*  HCT 25.7* 26.4* 35.4* 27.4*  PLT 123* 133* 260 230    COAGS: Recent Labs    11/26/18 1004  INR 1.1    BMP: Recent Labs    10/02/18 1530 10/29/18 0901 11/07/18 0247 11/25/18 2156  NA 140 139 138 137  K 3.3* 4.0 3.8 3.8  CL 109 106 104 102  CO2 23 26 22 26   GLUCOSE 123* 96 173* 115*  BUN 13 12 10 13   CALCIUM 8.5* 9.1 8.8* 9.5  CREATININE 0.70 0.65 0.72 0.74  GFRNONAA >60 NOT  CALCULATED >60 >60  GFRAA >60 NOT CALCULATED >60 >60    LIVER FUNCTION TESTS: Recent Labs    10/01/18 1115 10/02/18 1530 10/29/18 0901 11/25/18 2156  BILITOT 0.4 0.4 0.5 0.4  AST  25 17 23 22   ALT 40 36 28 25  ALKPHOS 64 59 65 67  PROT 6.4* 6.3* 6.3* 6.9  ALBUMIN 3.7 3.5 3.9 3.8     Assessment and Plan:  Right chest wall fluid collection. Plan for image-guided right chest wall fluid collection aspiration and drainage today with Dr. Annamaria Boots. Patient is NPO. Afebrile and WBCs WNL. She does not take blood thinners. INR 1.1 today.  Risks and benefits discussed with the patient including, but not limited to bleeding, infection, damage to adjacent structures or low yield requiring additional tests. All of the patient's questions were answered, patient is agreeable to proceed. Consent signed and in chart.   Thank you for this interesting consult.  I greatly enjoyed meeting Holly Wiley and look forward to participating in their care.  A copy of this report was sent to the requesting provider on this date.  Electronically Signed: Earley Abide, PA-C 11/26/2018, 11:01 AM   I spent a total of 40 Minutes in face to face in clinical consultation, greater than 50% of which was counseling/coordinating care for right chest wall fluid collection/aspiration.

## 2018-11-26 NOTE — Progress Notes (Signed)
Around 0300 am Nurse pt c/o feeling nausea and also feeling severe pain but zofran was not due at, offered Oxy IR 5mg , pt said cant't take po at this time, pleased call Dr. and asked for IV pain med which she got before in ED. Paged Surgeon ,around [phone] called me back around 0345am said no more IV narcotics, just give IV zofran and give other po pain meds as oreded ; same time received ordered to hold Vanco and only give Zosyn. Followed as Surgeon ordered. But due to system downtime, it took for while, pharmacy was not able to verify the meds. So Zofran IV given around 0400,  And offered oxy IR 5mg , pt was crying for severe pain 10 out of 10, and saying "still  can not swallow the pill , please call MD for IV meds, why he/she cannot prescribe IV meds when I am in severe pain" paged surgeon again around [phone], called me back at 0426 when I was in rm with pt. Mentioned Surgeon as pt requested, but surgeon said " no more IV narcotics in any case, give Zofran then  give oxy IR, muscle relaxer, benadryl what is already ordered." and cut the call. Pt got very upset, informed Charge nurse Estill Bamberg if she can help with the situation. But still it took while for Charge nurse to go in pt's rm, so I went in rm and asked how is her nausea, if she can have oxy for pain since she does not have any option at this time, so she said ok she will take oxy 5mg  whether it will go down or not, same time she asked can she have 1/2 tap of oxy, told her that I may need to get new order for it, then she said she will take whole pill than.same Meanwhile, RN Jemi called me to came out of the rm for now, then told pt I will be  came back in a min. But stocked out there  for while. Later on around 0445 charge nurse said she will give her oxy IR so handed the oxy IR to charge Nurse. Will continue to monitor closely.

## 2018-11-26 NOTE — Progress Notes (Signed)
Patient arrived in the unit at 0140am from ED escorted by ED staff, alert and oriented x4, c/o feeling moderate pain on her right lateral side of the breast, seemed pt was tolerating the pain, told pt that system is down as soon as system back probably around 0230 and will bring prn meds as ordered. Took VTs, temp 99.1, BP 110/68(80), P 98, Sat o2 98, RR 20, pt was situated in the bed comfortably, all safety and comfort measures were in placed such as call bell and personal belongings, bed alarm was on, and bed was locked. Pt said that she is ok right now if she needs any thing will pressed call bell or call in Nurse's phone. Afterthen, nurse got out of the rm to see other pts.

## 2018-11-26 NOTE — Procedures (Signed)
Breast mastitis, chest wall fld collection  S/p [place] ASP RIGHT BREAST CHEST WALL FLD  10CC THIN BLOOD TINGED FLD ASPIRATED  CX SENT  NO COMP

## 2018-11-26 NOTE — Progress Notes (Signed)
Patient requested to speak with CN.    Patient explained to this RN that she had been asking for pain medication since she arrived to the floor, patient stated that she does not take the full dose of oxycodone 5 mg due to it makes her nausea, that she only takes 2.5 mg. This RN gave 2.5 mg oxycodone to patient, patient took an extremely small about water with the medication since she is NPO  RN address all of patients concerns with her care, RN provided support and assurance.  Patient appeared to be resting comfortable

## 2018-11-26 NOTE — Progress Notes (Addendum)
Pharmacy Antibiotic Note  Holly Wiley is a 43 y.o. female admitted on 11/25/2018 with cellulitis.  Plan: Dc zosyn Cefepime 2 g q8 Vanc 1250 q12h Monitor renal fx cx vanc lvls prn OR plan  Height: 5\' 7"  (170.2 cm) Weight: 197 lb 12.8 oz (89.7 kg) IBW/kg (Calculated) : 61.6  Temp (24hrs), Avg:99.3 F (37.4 C), Min:98 F (36.7 C), Max:100.6 F (38.1 C)  Recent Labs  Lab 11/25/18 2156 11/26/18 0357  WBC 8.4 9.8  CREATININE 0.74  --   LATICACIDVEN 1.0  --     Estimated Creatinine Clearance: 105.3 mL/min (by C-G formula based on SCr of 0.74 mg/dL).    Allergies  Allergen Reactions  . Bee Venom Anaphylaxis  . Dihydroergotamine Anaphylaxis  . Imitrex [Sumatriptan] Other (See Comments)    SEIZURE-LIKE ACTIVITY  . Metoclopramide Other (See Comments)    TACHYCARDIA  . Transderm-Scop [Scopolamine] Other (See Comments)    AGITATION   Vancomycin 1250 mg IV Q 12 hrs. Goal AUC 400-550. Expected AUC: 457 SCr used: 0.74  Levester Fresh, PharmD, BCPS, BCCCP Clinical Pharmacist [phone]  Please check AMION for all [place] numbers  11/26/2018 9:14 AM

## 2018-11-26 NOTE — Consult Note (Signed)
[facility]    Date of Admission:  11/25/2018     Total days of antibiotics 1               Reason for Consult: Mastitis  Referring Provider: Dalbert Batman  Primary Care Provider: [name], NP   Assessment/Plan:  Holly Wiley is a 43 y/o female with recent diagnosis of triple negative right breast cancer s/p bilateral mastectomies on 5/8 with JP drain placement now admitted with post surgical soft tissue infection.   Post surgical soft tissue infection / Mastitis - Continue with broad spectrum antibiotics, however will narrow to vancomycin and cefepime. Monitor cultures for further narrowing as able. She has received vancomycin and piperacillin-tazobactam prior to aspiration today which may affect culture growth.  Continue wound care per general surgery.   Triple negative breast cancer s/p bilateral mastectomy - Followed by Dr. Lindi Adie and currently in surveillance.   Therapeutic Drug Monitoring - Monitor renal function for nephrotoxicity while on vancomycin.   Principal Problem:   Postoperative wound infection Active Problems:   Breast cancer ([place])   . diphenhydrAMINE  25 mg Intravenous Once  . docusate sodium  200 mg Oral BID  . enoxaparin (LOVENOX) injection  40 mg Subcutaneous Q24H  . fentaNYL      . gabapentin  900 mg Oral QID  . lidocaine (PF)      . midazolam         HPI: Holly Wiley is a 43 y.o. female with history of triple negative right breast cancer s/p bilateral total mastectomy with right axillary lymph node dissection and JP drain placement x 4 on 11/06/18 admitted with at home fevers of 102 and significant increase in pain in the right chest wall. JP drains continued to drain with odor present on the right side and no changes in color or character of the drainage. Code sepsis was initiated and blood cultures were drawn.  CT imaging of the chest with hazy inflammatory changes in the right subcutaenous fat of the right anterior  chest wall with 2.2 x 5.4 cm complex fluid collection possibly reflecting hematoma or abscess.   JP drains removed at bedside and she was started on broad spectrum antibiotics with vancomycin and piperacillin-tazobactam. Ir completed aspiration obtaining 10 cc of thin blood tinged fluid which was sent for culture.   Wound specimen obtained on 5/27 with gram stain showing gram negative rods, gram positive cocci, and gram positive rods. Specimen was reincubated for better growth. Blood cultures remain pending. Lab work reviewed with no leukocytosis at present.   Holly Wiley had been progressing well until around Sunday when she began to have increasing pain on the right side of her chest. There was worsening edema over the course of the week and she developed a fever around 5 pm yesterday. Severity of her symptoms felt like she was going into sepsis and concerned about dying.  Pain is currently improved and rated 4-5/10 when sitting still and increases with movement. Pain medication has helped to ease her pain. She has not had any other symptoms or recent antibiotics. She notes the culture from yesterday may have been taken out of the JP drain.    Review of Systems: Review of Systems  Constitutional: Positive for malaise/fatigue. Negative for chills and fever.  Respiratory: Negative for cough, shortness of breath and wheezing.   Cardiovascular: Positive for chest pain (Chest wall pain).  Gastrointestinal: Negative for abdominal pain, constipation, diarrhea, nausea  and vomiting.  Genitourinary: Negative for frequency and urgency.  Skin: Negative for rash.     Past Medical History:  Diagnosis Date  . Breast cancer ([place]) 07/2018   right  . Dental crown present   . Family history of bladder cancer   . Family history of breast cancer   . Family history of colon cancer   . Family history of pancreatic cancer   . 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  .  Migraines   . PONV (postoperative nausea and vomiting)     Social History   Tobacco Use  . Smoking status: Never Smoker  . Smokeless tobacco: Never Used  Substance Use Topics  . Alcohol use: Yes    Comment: occasionally  . Drug use: No    Family History  Problem Relation Age of Onset  . Breast cancer Mother 45       triple negative, GT neg  . Diabetes Maternal Aunt   . Bladder Cancer Maternal Aunt 69  . Colon cancer Maternal Uncle 52  . Diabetes Maternal Uncle   . Diabetes Maternal Grandmother   . Stroke Paternal Grandfather   . Colon cancer Other 82  . Breast cancer Other        dx >50  . Colon cancer Other        dx>50  . Pancreatic cancer Other     Allergies  Allergen Reactions  . Bee Venom Anaphylaxis  . Dihydroergotamine Anaphylaxis  . Imitrex [Sumatriptan] Other (See Comments)    SEIZURE-LIKE ACTIVITY  . Metoclopramide Other (See Comments)    TACHYCARDIA  . Transderm-Scop [Scopolamine] Other (See Comments)    AGITATION    OBJECTIVE: Blood pressure 101/61, pulse 95, temperature 98.9 F (37.2 C), temperature source Oral, resp. rate 17, height 5\' 7"  (1.702 m), weight 89.7 kg, SpO2 96 %.  Physical Exam Constitutional:      General: She is not in acute distress.    Appearance: She is well-developed.     Comments: Lying in bed with head of bed elevated; pleasant.   Cardiovascular:     Rate and Rhythm: Normal rate and regular rhythm.     Heart sounds: Normal heart sounds.  Pulmonary:     Effort: Pulmonary effort is normal.     Breath sounds: Normal breath sounds.     Comments: Chest wall appears without significant redness or discoloration.  Skin:    General: Skin is warm and dry.  Neurological:     Mental Status: She is alert and oriented to person, place, and time.  Psychiatric:        Behavior: Behavior normal.        Thought Content: Thought content normal.        Judgment: Judgment normal.     Lab Results Lab Results  Component Value Date    WBC 9.8 11/26/2018   HGB 9.5 (L) 11/26/2018   HCT 27.4 (L) 11/26/2018   MCV 99.3 11/26/2018   PLT 230 11/26/2018    Lab Results  Component Value Date   CREATININE 0.74 11/25/2018   BUN 13 11/25/2018   NA 137 11/25/2018   K 3.8 11/25/2018   CL 102 11/25/2018   CO2 26 11/25/2018    Lab Results  Component Value Date   ALT 25 11/25/2018   AST 22 11/25/2018   ALKPHOS 67 11/25/2018   BILITOT 0.4 11/25/2018     Microbiology: Recent Results (from the past 240 hour(s))  SARS  Coronavirus 2 (CEPHEID - Performed in [facility] hospital lab), Hosp Order     Status: None   Collection Time: 11/25/18  9:22 PM  Result Value Ref Range Status   SARS Coronavirus 2 NEGATIVE NEGATIVE Final    Comment: (NOTE) If result is NEGATIVE SARS-CoV-2 target nucleic acids are NOT DETECTED. The SARS-CoV-2 RNA is generally detectable in upper and lower  respiratory specimens during the acute phase of infection. The lowest  concentration of SARS-CoV-2 viral copies this assay can detect is 250  copies / mL. A negative result does not preclude SARS-CoV-2 infection  and should not be used as the sole basis for treatment or other  patient management decisions.  A negative result may occur with  improper specimen collection / handling, submission of specimen other  than nasopharyngeal swab, presence of viral mutation(s) within the  areas targeted by this assay, and inadequate number of viral copies  (<250 copies / mL). A negative result must be combined with clinical  observations, patient history, and epidemiological information. If result is POSITIVE SARS-CoV-2 target nucleic acids are DETECTED. The SARS-CoV-2 RNA is generally detectable in upper and lower  respiratory specimens dur ing the acute phase of infection.  Positive  results are indicative of active infection with SARS-CoV-2.  Clinical  correlation with patient history and other diagnostic information is  necessary to determine patient  infection status.  Positive results do  not rule out bacterial infection or co-infection with other viruses. If result is PRESUMPTIVE POSTIVE SARS-CoV-2 nucleic acids MAY BE PRESENT.   A presumptive positive result was obtained on the submitted specimen  and confirmed on repeat testing.  While 2019 novel coronavirus  (SARS-CoV-2) nucleic acids may be present in the submitted sample  additional confirmatory testing may be necessary for epidemiological  and / or clinical management purposes  to differentiate between  SARS-CoV-2 and other Sarbecovirus currently known to infect humans.  If clinically indicated additional testing with an alternate test  methodology [phone]) is advised. The SARS-CoV-2 RNA is generally  detectable in upper and lower respiratory sp ecimens during the acute  phase of infection. The expected result is Negative. Fact Sheet for Patients:  StrictlyIdeas.no Fact Sheet for Healthcare Providers: BankingDealers.co.za This test is not yet approved or cleared by the [place] FDA and has been authorized for detection and/or diagnosis of SARS-CoV-2 by FDA under an Emergency Use Authorization (EUA).  This EUA will remain in effect (meaning this test can be used) for the duration of the COVID-19 declaration under Section 564(b)(1) of the Act, 21 U.S.C. section 360bbb-3(b)(1), unless the authorization is terminated or revoked sooner. Performed at [facility], [place] [address], [place], [place] [address]   Aerobic/Anaerobic Culture (surgical/deep wound)     Status: None (Preliminary result)   Collection Time: 11/25/18 10:47 PM  Result Value Ref Range Status   Specimen Description WOUND  Final   Special Requests DRAIN IS MALODOROUS  Final   Gram Stain   Final    MODERATE WBC PRESENT, PREDOMINANTLY PMN ABUNDANT GRAM NEGATIVE RODS MODERATE GRAM POSITIVE COCCI FEW GRAM POSITIVE RODS    Culture   Final    CULTURE  REINCUBATED FOR BETTER GROWTH Performed at [facility], [place] [address] [place], [place] [address]    Report Status PENDING  Incomplete     Terri Piedra, [place] for [place] [phone] Pager  11/26/2018  12:33 PM

## 2018-11-26 NOTE — Progress Notes (Signed)
Just received Zosyn from pharmacy and handed to RN Jemi cause miss Jemi was in the rm with pt and she said she will administered the Zosyn.

## 2018-11-26 NOTE — Progress Notes (Signed)
Pharmacy Antibiotic Note  Holly Wiley is a 43 y.o. female admitted on 11/25/2018 with cellulitis.Pharmacy has been consulted for vancomycin and zosyn dosing.  Plan: Zosyn 3.375g IV q8h (4 hour infusion).  Vancomycin 1750 started in ED F/u allergy to vancomycin  Height: 5\' 7"  (170.2 cm) Weight: 197 lb 12.8 oz (89.7 kg) IBW/kg (Calculated) : 61.6  Temp (24hrs), Avg:99.7 F (37.6 C), Min:99.3 F (37.4 C), Max:100 F (37.8 C)  Recent Labs  Lab 11/25/18 2156  WBC 8.4  CREATININE 0.74  LATICACIDVEN 1.0    Estimated Creatinine Clearance: 105.3 mL/min (by C-G formula based on SCr of 0.74 mg/dL).    Allergies  Allergen Reactions  . Bee Venom Anaphylaxis  . Dihydroergotamine Anaphylaxis  . Imitrex [Sumatriptan] Other (See Comments)    SEIZURE-LIKE ACTIVITY  . Metoclopramide Other (See Comments)    TACHYCARDIA  . Transderm-Scop [Scopolamine] Other (See Comments)    AGITATION    Thank you for allowing pharmacy to be a part of this patient's care.  Excell Seltzer Poteet 11/26/2018 2:12 AM

## 2018-11-26 NOTE — Progress Notes (Signed)
Subjective: Events noted.  ER and admission records reviewed.  Presentation discussed with Dr. Dema Severin.  Lab and CT scan reviewed  She noted fever to 102 yesterday and swelling and tenderness right lateral chest wall but this is only been going on for about 24 hours according to her.  She says the right side drains smell bad.  No complaints on the left.  CT scan shows a little bit of soft tissue thickening and stranding on the right although I am not sure there is any drainable abscess.  This morning she is awake and alert and stable with a temp of 100.6.  Complains of pain on the right side.  We will continue gabapentin, tramadol and add Dilaudid as needed.  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  She saw a Suella Broad last week and he begin therapies.  Labs this morning show hemoglobin 9.5.  WBC 9800.  Interestingly, differential is normal.  C met yesterday unremarkable    Objective: Vital signs in last 24 hours: Temp:  [99.1 F (37.3 C)-100.6 F (38.1 C)] 100.6 F (38.1 C) (05/28 0405) Pulse Rate:  [99-115] 102 (05/28 0405) Resp:  [14-18] 18 (05/28 0405) BP: (100-121)/(56-75) 107/66 (05/28 0405) SpO2:  [95 %-100 %] 100 % (05/28 0405) Weight:  [89.7 kg] 89.7 kg (05/27 2055)    Intake/Output from previous day: 05/27 0701 - 05/28 0700 In: 0  Out: 53.5 [Urine:1; Drains:52.5] Intake/Output this shift: No intake/output data recorded.   PE: General appearance: Alert.  Oriented.  Cooperative.  Mild to moderate distress.  Does not look toxic, however Resp: clear to auscultation bilaterally Chest wall: no tenderness, Bilateral complex mastectomy incisions are intact without skin necrosis or drainage.  There is mild erythema and some thickening right lateral although no fluctuance or purulent drainage.  I removed all 4 drains to get all foreign bodies out of her.  Port site right infraclavicular area looks fine.  Left  mastectomy wound shows no signs of infection. Cardio: Borderline regular tachycardia at 102.  Lab Results:  Recent Labs    11/25/18 2156 11/26/18 0357  WBC 8.4 9.8  HGB 12.0 9.5*  HCT 35.4* 27.4*  PLT 260 230   BMET Recent Labs    11/25/18 2156  NA 137  K 3.8  CL 102  CO2 26  GLUCOSE 115*  BUN 13  CREATININE 0.74  CALCIUM 9.5   PT/INR No results for input(s): LABPROT, INR in the last 72 hours. ABG No results for input(s): PHART, HCO3 in the last 72 hours.  Invalid input(s): PCO2, PO2  Studies/Results: Ct Chest W Contrast  Result Date: 11/26/2018 CLINICAL DATA:  Status post double mastectomy with axillary lymph node dissection and jp drains w/ foul odor, new swelling to lateral chest wall, febrile. EXAM: CT CHEST WITH CONTRAST TECHNIQUE: Multidetector CT imaging of the chest was performed during intravenous contrast administration. CONTRAST:  80m OMNIPAQUE IOHEXOL 300 MG/ML  SOLN COMPARISON:  None. FINDINGS: Cardiovascular: No significant vascular findings. Normal heart size. Small pericardial effusion. Mediastinum/Nodes: No enlarged mediastinal, hilar, or axillary lymph nodes. Thyroid gland, trachea, and esophagus demonstrate no significant findings. Lungs/Pleura: Lungs are clear. No pleural effusion or pneumothorax. Upper Abdomen: No acute abnormality. Musculoskeletal: No acute osseous abnormality. No aggressive osseous lesion. Other: Bilateral mastectomies. Surgical drains are present in the surgical bed of the right and left mastectomy. Hazy inflammatory changes in the subcutaneous fat of the right anterior chest wall with a 2.2 x 5.4  cm complex fluid collection in the surgical bed of the right mastectomy which may reflect a liquified hematoma versus abscess. IMPRESSION: 1. Bilateral mastectomies with surgical drains present bilaterally. Hazy inflammatory changes in the subcutaneous fat of the right anterior chest wall with a 2.2 x 5.4 cm complex fluid collection in the  surgical bed of the right mastectomy which may reflect a liquified hematoma versus abscess. Electronically Signed   By: Kathreen Devoid   On: 11/26/2018 00:45    Anti-infectives: Anti-infectives (From admission, onward)   Start     Dose/Rate Route Frequency Ordered Stop   11/26/18 0230  vancomycin (VANCOCIN) IVPB 1000 mg/200 mL premix     1,000 mg 200 mL/hr over 60 Minutes Intravenous  Once 11/26/18 0223     11/26/18 0100  piperacillin-tazobactam (ZOSYN) IVPB 3.375 g     3.375 g 12.5 mL/hr over 240 Minutes Intravenous Every 8 hours 11/26/18 0059     11/25/18 2230  vancomycin (VANCOCIN) 1,750 mg in sodium chloride 0.9 % 500 mL IVPB     1,750 mg 250 mL/hr over 120 Minutes Intravenous  Once 11/25/18 2126 11/26/18 0250   11/25/18 2130  ceFEPIme (MAXIPIME) 2 g in sodium chloride 0.9 % 100 mL IVPB     2 g 200 mL/hr over 30 Minutes Intravenous  Once 11/25/18 2121 11/25/18 2243   11/25/18 2130  metroNIDAZOLE (FLAGYL) IVPB 500 mg     500 mg 100 mL/hr over 60 Minutes Intravenous  Once 11/25/18 2121 11/26/18 0038   11/25/18 2130  vancomycin (VANCOCIN) IVPB 1000 mg/200 mL premix  Status:  Discontinued     1,000 mg 200 mL/hr over 60 Minutes Intravenous  Once 11/25/18 2121 11/25/18 2126      Assessment/Plan:    Postop soft tissue infection right chest wall.  Complex cellulitis versus developing abscess. -Drains removed to eliminate foreign body participation in source control -Continue vancomycin and Zosyn. -Await blood cultures -ID consult.  Discussed with Dr. Lita Mains who will see -Sent to IR today for aspiration if any fluid available -If she does not respond, the wound may have to be opened in the OR but that does not seem to be necessary at this point  Right arm neuropathy and neuritis.  Suspect this is related to her right axillary sentinel node biopsy.  Continue gabapentin  Right breast cancer, triple negative -She had a complete pathologic response from her neoadjuvant  chemotherapy Status post neoadjuvant chemotherapy -Followed by Dr. Verdie Drown    LOS: 0 days    Adin Hector 11/26/2018

## 2018-11-27 DIAGNOSIS — T8140XA Infection following a procedure, unspecified, initial encounter: Secondary | ICD-10-CM

## 2018-11-27 DIAGNOSIS — R0789 Other chest pain: Secondary | ICD-10-CM

## 2018-11-27 LAB — CBC
HCT: 29.8 % — ABNORMAL LOW (ref 36.0–46.0)
Hemoglobin: 9.9 g/dL — ABNORMAL LOW (ref 12.0–15.0)
MCH: 33.8 pg (ref 26.0–34.0)
MCHC: 33.2 g/dL (ref 30.0–36.0)
MCV: 101.7 fL — ABNORMAL HIGH (ref 80.0–100.0)
Platelets: 165 10*3/uL (ref 150–400)
RBC: 2.93 MIL/uL — ABNORMAL LOW (ref 3.87–5.11)
RDW: 11.9 % (ref 11.5–15.5)
WBC: 8.5 10*3/uL (ref 4.0–10.5)
nRBC: 0 % (ref 0.0–0.2)

## 2018-11-27 LAB — BASIC METABOLIC PANEL
Anion gap: 8 (ref 5–15)
BUN: 6 mg/dL (ref 6–20)
CO2: 24 mmol/L (ref 22–32)
Calcium: 8.4 mg/dL — ABNORMAL LOW (ref 8.9–10.3)
Chloride: 103 mmol/L (ref 98–111)
Creatinine, Ser: 0.67 mg/dL (ref 0.44–1.00)
GFR calc Af Amer: >60 mL/min (ref >60)
GFR calc non Af Amer: >60 mL/min (ref >60)
Glucose, Bld: 172 mg/dL — ABNORMAL HIGH (ref 70–99)
Potassium: 3.3 mmol/L — ABNORMAL LOW (ref 3.5–5.1)
Sodium: 135 mmol/L (ref 135–145)

## 2018-11-27 MED ORDER — PROMETHAZINE HCL 25 MG/ML IJ SOLN
12.5000 mg | Freq: Four times a day (QID) | INTRAMUSCULAR | Status: DC | PRN
  Administered 2018-11-27 – 2018-12-01 (×7): 12.5 mg via INTRAVENOUS
  Filled 2018-11-27 (×7): qty 1

## 2018-11-27 MED ORDER — SODIUM CHLORIDE 0.9% FLUSH
10.0000 mL | INTRAVENOUS | Status: DC | PRN
  Administered 2018-11-27 – 2018-11-29 (×2): 10 mL
  Filled 2018-11-27 (×2): qty 40

## 2018-11-27 MED ORDER — POTASSIUM CHLORIDE 2 MEQ/ML IV SOLN
INTRAVENOUS | Status: DC
  Administered 2018-11-27: 09:00:00 via INTRAVENOUS
  Filled 2018-11-27 (×5): qty 1000

## 2018-11-27 MED ORDER — CHLORHEXIDINE GLUCONATE CLOTH 2 % EX PADS
6.0000 | MEDICATED_PAD | Freq: Once | CUTANEOUS | Status: AC
  Administered 2018-11-28: 06:00:00 6 via TOPICAL

## 2018-11-27 MED ORDER — ACETAMINOPHEN 325 MG PO TABS
650.0000 mg | ORAL_TABLET | Freq: Four times a day (QID) | ORAL | Status: DC | PRN
  Administered 2018-11-27 – 2018-12-02 (×6): 650 mg via ORAL
  Filled 2018-11-27 (×6): qty 2

## 2018-11-27 MED ORDER — CHLORHEXIDINE GLUCONATE CLOTH 2 % EX PADS
6.0000 | MEDICATED_PAD | Freq: Once | CUTANEOUS | Status: AC
  Administered 2018-11-27: 23:00:00 6 via TOPICAL

## 2018-11-27 MED ORDER — SENNOSIDES-DOCUSATE SODIUM 8.6-50 MG PO TABS
1.0000 | ORAL_TABLET | Freq: Two times a day (BID) | ORAL | Status: DC
  Administered 2018-11-27 – 2018-12-02 (×10): 1 via ORAL
  Filled 2018-11-27 (×11): qty 1

## 2018-11-27 MED ORDER — SODIUM CHLORIDE 0.9% FLUSH
10.0000 mL | Freq: Two times a day (BID) | INTRAVENOUS | Status: DC
  Administered 2018-11-27 – 2018-11-30 (×2): 10 mL

## 2018-11-27 MED ORDER — POLYETHYLENE GLYCOL 3350 17 G PO PACK
17.0000 g | PACK | Freq: Two times a day (BID) | ORAL | Status: DC | PRN
  Administered 2018-11-27 – 2018-12-02 (×4): 17 g via ORAL
  Filled 2018-11-27 (×4): qty 1

## 2018-11-27 NOTE — Progress Notes (Signed)
Message sent to Dr. Dalbert Batman through answering service to inform MD that patient's temp is 102.6.  Call back to Christain [place], Therapist, sports.

## 2018-11-27 NOTE — Progress Notes (Addendum)
Subjective:  I appreciate input from Dr. Johnnye Sima ID service and from Dr. Reesa Chew in interventional radiology  Patient is stable but complains of malaise and an episode of nausea and vomiting.  No bowel movement.  Voiding without difficulty.  Ambulating in the halls.  Still complains of pain and tenderness right lateral chest wall. Aspiration of indurated area just yielded a little bit of bloody fluid but no purulence.  Cultures pending  Temp 99 8.  Heart rate 92.  Respiratory rate 16. WBC 8500.  Potassium 3.3.  Creatinine 0.67.  Glucose 172  Objective: Vital signs in last 24 hours: Temp:  [98 F (36.7 C)-100.8 F (38.2 C)] 99.8 F (37.7 C) (05/29 0433) Pulse Rate:  [89-101] 92 (05/29 0433) Resp:  [7-20] 16 (05/29 0433) BP: (97-172)/(58-103) 108/58 (05/29 0433) SpO2:  [93 %-100 %] 96 % (05/29 0433) Last BM Date: 11/25/18  Intake/Output from previous day: 05/28 0701 - 05/29 0700 In: 3182.4 [P.O.:840; I.V.:1721.1; IV Piggyback:621.4] Out: -  Intake/Output this shift: Total I/O In: 2342.4 [I.V.:1721.1; IV Piggyback:621.4] Out: -     PE: General appearance: Alert.  Oriented.  Cooperative.  Mild to moderate distress.  Does not look toxic, however.  I found her ambulating in the hall.   Resp: clear to auscultation bilaterally Chest wall: , Bilateral complex mastectomy incisions are intact without skin necrosis or drainage.  There is mild erythema and some thickening and tender induration right lateral although no fluctuance or purulent drainage. Port site right infraclavicular area looks fine.  Left mastectomy wound shows no signs of infection. Cardio: Regular rate and rhythm.  About 92   Lab Results:  Recent Labs    11/26/18 0357 11/27/18 0450  WBC 9.8 8.5  HGB 9.5* 9.9*  HCT 27.4* 29.8*  PLT 230 165   BMET Recent Labs    11/25/18 2156 11/27/18 0450  NA 137 135  K 3.8 3.3*  CL 102 103  CO2 26 24  GLUCOSE 115* 172*  BUN 13 6  CREATININE 0.74 0.67  CALCIUM  9.5 8.4*   PT/INR Recent Labs    11/26/18 1004  LABPROT 14.2  INR 1.1   ABG No results for input(s): PHART, HCO3 in the last 72 hours.  Invalid input(s): PCO2, PO2  Studies/Results: Ct Chest W Contrast  Result Date: 11/26/2018 CLINICAL DATA:  Status post double mastectomy with axillary lymph node dissection and jp drains w/ foul odor, new swelling to lateral chest wall, febrile. EXAM: CT CHEST WITH CONTRAST TECHNIQUE: Multidetector CT imaging of the chest was performed during intravenous contrast administration. CONTRAST:  38mL OMNIPAQUE IOHEXOL 300 MG/ML  SOLN COMPARISON:  None. FINDINGS: Cardiovascular: No significant vascular findings. Normal heart size. Small pericardial effusion. Mediastinum/Nodes: No enlarged mediastinal, hilar, or axillary lymph nodes. Thyroid gland, trachea, and esophagus demonstrate no significant findings. Lungs/Pleura: Lungs are clear. No pleural effusion or pneumothorax. Upper Abdomen: No acute abnormality. Musculoskeletal: No acute osseous abnormality. No aggressive osseous lesion. Other: Bilateral mastectomies. Surgical drains are present in the surgical bed of the right and left mastectomy. Hazy inflammatory changes in the subcutaneous fat of the right anterior chest wall with a 2.2 x 5.4 cm complex fluid collection in the surgical bed of the right mastectomy which may reflect a liquified hematoma versus abscess. IMPRESSION: 1. Bilateral mastectomies with surgical drains present bilaterally. Hazy inflammatory changes in the subcutaneous fat of the right anterior chest wall with a 2.2 x 5.4 cm complex fluid collection in the surgical bed of the right mastectomy which  may reflect a liquified hematoma versus abscess. Electronically Signed   By: Kathreen Devoid   On: 11/26/2018 00:45   US Guided Needle Placement  Result Date: 11/26/2018 INDICATION: Right mastectomy site chest wall fluid collection, concern for mastitis and abscess EXAM: ULTRASOUND ASPIRATION LEFT CHEST  WALL FLUID COLLECTION MEDICATIONS: The patient is currently admitted to the hospital and receiving intravenous antibiotics. The antibiotics were administered within an appropriate time frame prior to the initiation of the procedure. ANESTHESIA/SEDATION: Fentanyl 50 mcg IV; Versed 1.0 mg IV Moderate Sedation Time:  11 MINUTES The patient was continuously monitored during the procedure by the interventional radiology nurse under my direct supervision. COMPLICATIONS: None immediate. PROCEDURE: Informed written consent was obtained from the patient after a thorough discussion of the procedural risks, benefits and alternatives. All questions were addressed. Maximal Sterile Barrier Technique was utilized including caps, mask, sterile gowns, sterile gloves, sterile drape, hand hygiene and skin antiseptic. A timeout was performed prior to the initiation of the procedure. Previous imaging reviewed. Preliminary ultrasound performed of the right mastectomy site. There is a minimally complex hypoechoic elongated fluid collection along the deep chest wall which correlates with the CT finding. Under sterile conditions and local anesthesia, a 17 gauge access needle was advanced percutaneously into the fluid collection. Needle position confirmed with ultrasound. Syringe aspiration yielded only 10 cc thin bloody serosanguineous fluid. No purulence component. Sample sent culture. Needle removed. IMPRESSION: Successful ultrasound right chest wall mastectomy site fluid collection aspiration. Sample sent for culture. Electronically Signed   By: Jerilynn Mages.  Shick M.D.   On: 11/26/2018 12:07    Anti-infectives: Anti-infectives (From admission, onward)   Start     Dose/Rate Route Frequency Ordered Stop   11/26/18 1400  ceFEPIme (MAXIPIME) 2 g in sodium chloride 0.9 % 100 mL IVPB     2 g 200 mL/hr over 30 Minutes Intravenous Every 8 hours 11/26/18 1328     11/26/18 1000  vancomycin (VANCOCIN) 1,250 mg in sodium chloride 0.9 % 250 mL IVPB      1,250 mg 166.7 mL/hr over 90 Minutes Intravenous Every 12 hours 11/26/18 0913     11/26/18 0230  vancomycin (VANCOCIN) IVPB 1000 mg/200 mL premix  Status:  Discontinued     1,000 mg 200 mL/hr over 60 Minutes Intravenous  Once 11/26/18 0223 11/26/18 0910   11/26/18 0100  piperacillin-tazobactam (ZOSYN) IVPB 3.375 g  Status:  Discontinued     3.375 g 12.5 mL/hr over 240 Minutes Intravenous Every 8 hours 11/26/18 0059 11/26/18 1326   11/25/18 2230  vancomycin (VANCOCIN) 1,750 mg in sodium chloride 0.9 % 500 mL IVPB     1,750 mg 250 mL/hr over 120 Minutes Intravenous  Once 11/25/18 2126 11/26/18 0250   11/25/18 2130  ceFEPIme (MAXIPIME) 2 g in sodium chloride 0.9 % 100 mL IVPB     2 g 200 mL/hr over 30 Minutes Intravenous  Once 11/25/18 2121 11/25/18 2243   11/25/18 2130  metroNIDAZOLE (FLAGYL) IVPB 500 mg     500 mg 100 mL/hr over 60 Minutes Intravenous  Once 11/25/18 2121 11/26/18 0038   11/25/18 2130  vancomycin (VANCOCIN) IVPB 1000 mg/200 mL premix  Status:  Discontinued     1,000 mg 200 mL/hr over 60 Minutes Intravenous  Once 11/25/18 2121 11/25/18 2126      Assessment/Plan:  Postop soft tissue infection right chest wall.  Complex cellulitis versus developing abscess. -Clinically and radiographically this seems to be cellulitis and induration and there is no drainable abscess -  Drains removed 5/28 to eliminate foreign body participation in source control -She is not septic or toxic but symptomatically not improved. -Continue vancomycin and maxipime. -Check labs and renal function tomorrow -Await blood cultures -ID following -IR evaluation and aspiration did not reveal purulent fluid. -If she does not respond, the wound may have to be opened in the OR but that does not seem to be necessary at this point. -I will see her Saturday and Sunday- -nausea and vomiting likely secondary to narcotic.  Using tramadol instead  Right arm neuropathy and neuritis.  Suspect this is related  to her right axillary sentinel node biopsy.  Continue gabapentin.  She says this pain is less than the right chest wall pain.  Right breast cancer, triple negative -She had a complete pathologic response from her neoadjuvant chemotherapy Status post neoadjuvant chemotherapy -Followed by Dr. Lindi Adie  Constipation.  Add MiraLAX and Senokot Hypokalemia.  IV adjusted  Macromastia     LOS: 1 day    Adin Hector 11/27/2018

## 2018-11-27 NOTE — H&P (Signed)
CC: Possible right chest wall abscess in setting of recent mastectomy  HPI: Holly Wiley is an 43 y.o. 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 Dalbert Batman, with skin reduction pattern (Dr. Iran Planas) 11/06/2018. She has 2 JP drains on each side. She was admitted for 3 days after surgery for pain control issues and neuropathic pain.   She returned to ED today with complaints of temp at home of 102F and significant increase in pain in right chest wall. Describes this as constant and sharp, nonradiating. Nothing makes it better/worse. She reports foul odor from right JP drains but no change in color or character. She denies any other symptoms including nausea/vomiting/weakness.       Past Medical History:  Diagnosis Date  . Breast cancer ([place]) 07/2018   right  . Dental crown present   . Family history of bladder cancer   . Family history of breast cancer   . Family history of colon cancer   . Family history of pancreatic cancer   . 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  . Migraines   . PONV (postoperative nausea and vomiting)          Past Surgical History:  Procedure Laterality Date  . BILATERAL TOTAL MASTECTOMY WITH AXILLARY LYMPH NODE DISSECTION N/A 11/06/2018   Procedure: BILATERAL TOTAL MASTECTOMY WITH AXILLARY LYMPH NODE DISSECTION;  Surgeon: Fanny Skates, MD;  Location: [facility];  Service: General;  Laterality: N/A;  . BREAST BIOPSY Right 11/08/2013   Procedure: EXCISION OF RIGHT  BREAST MASS;  Surgeon: Adin Hector, MD;  Location: [facility];  Service: General;  Laterality: Right;  . BUNIONECTOMY Left   . CARPAL TUNNEL RELEASE Right 02/26/2017  . CERVICAL CONE BIOPSY  05/2005  . COLONOSCOPY  2014  . PORT-A-CATH REMOVAL N/A 11/06/2018   Procedure: Removal Port-A-Cath;  Surgeon: Fanny Skates, MD;   Location: [facility];  Service: General;  Laterality: N/A;  . PORTACATH PLACEMENT Right 07/08/2018   Procedure: INSERTION PORT-A-CATH;  Surgeon: Fanny Skates, MD;  Location: [facility];  Service: General;  Laterality: Right;  . SHOULDER ARTHROSCOPY W/ LABRAL REPAIR Right   . SHOULDER ARTHROSCOPY W/ ROTATOR CUFF REPAIR Left   . TENOTOMY FOREARM / WRIST Right 02/26/2017         Family History  Problem Relation Age of Onset  . Breast cancer Mother 31       triple negative, GT neg  . Diabetes Maternal Aunt   . Bladder Cancer Maternal Aunt 69  . Colon cancer Maternal Uncle 74  . Diabetes Maternal Uncle   . Diabetes Maternal Grandmother   . Stroke Paternal Grandfather   . Colon cancer Other 59  . Breast cancer Other        dx >50  . Colon cancer Other        dx>50  . Pancreatic cancer Other     Social:  reports that she has never smoked. She has never used smokeless tobacco. She reports current alcohol use. She reports that she does not use drugs.  Allergies:       Allergies  Allergen Reactions  . Bee Venom Anaphylaxis  . Dihydroergotamine Anaphylaxis  . Imitrex [Sumatriptan] Other (See Comments)    SEIZURE-LIKE ACTIVITY  . Metoclopramide Other (See Comments)    TACHYCARDIA  . Transderm-Scop [Scopolamine] Other (See Comments)  AGITATION    Medications: I have reviewed the patient's current medications.  LabResultsLast48Hours        Results for orders placed or performed during the hospital encounter of 11/25/18 (from the past 48 hour(s))  SARS Coronavirus 2 (CEPHEID - Performed in [facility] hospital lab), Hosp Order     Status: None   Collection Time: 11/25/18  9:22 PM  Result Value Ref Range   SARS Coronavirus 2 NEGATIVE NEGATIVE    Comment: (NOTE) If result is NEGATIVE SARS-CoV-2 target nucleic acids are NOT DETECTED. The SARS-CoV-2 RNA is generally detectable in upper and lower  respiratory specimens during the  acute phase of infection. The lowest  concentration of SARS-CoV-2 viral copies this assay can detect is 250  copies / mL. A negative result does not preclude SARS-CoV-2 infection  and should not be used as the sole basis for treatment or other  patient management decisions.  A negative result may occur with  improper specimen collection / handling, submission of specimen other  than nasopharyngeal swab, presence of viral mutation(s) within the  areas targeted by this assay, and inadequate number of viral copies  (<250 copies / mL). A negative result must be combined with clinical  observations, patient history, and epidemiological information. If result is POSITIVE SARS-CoV-2 target nucleic acids are DETECTED. The SARS-CoV-2 RNA is generally detectable in upper and lower  respiratory specimens dur ing the acute phase of infection.  Positive  results are indicative of active infection with SARS-CoV-2.  Clinical  correlation with patient history and other diagnostic information is  necessary to determine patient infection status.  Positive results do  not rule out bacterial infection or co-infection with other viruses. If result is PRESUMPTIVE POSTIVE SARS-CoV-2 nucleic acids MAY BE PRESENT.   A presumptive positive result was obtained on the submitted specimen  and confirmed on repeat testing.  While 2019 novel coronavirus  (SARS-CoV-2) nucleic acids may be present in the submitted sample  additional confirmatory testing may be necessary for epidemiological  and / or clinical management purposes  to differentiate between  SARS-CoV-2 and other Sarbecovirus currently known to infect humans.  If clinically indicated additional testing with an alternate test  methodology [phone]) is advised. The SARS-CoV-2 RNA is generally  detectable in upper and lower respiratory sp ecimens during the acute  phase of infection. The expected result is Negative. Fact Sheet for Patients:   StrictlyIdeas.no Fact Sheet for Healthcare Providers: BankingDealers.co.za This test is not yet approved or cleared by the [place] FDA and has been authorized for detection and/or diagnosis of SARS-CoV-2 by FDA under an Emergency Use Authorization (EUA).  This EUA will remain in effect (meaning this test can be used) for the duration of the COVID-19 declaration under Section 564(b)(1) of the Act, 21 U.S.C. section 360bbb-3(b)(1), unless the authorization is terminated or revoked sooner. Performed at [facility], [place] [address], [place], [place] [address]   Lactic acid, plasma     Status: None   Collection Time: 11/25/18  9:56 PM  Result Value Ref Range   Lactic Acid, Venous 1.0 0.5 - 1.9 mmol/L    Comment: Performed at [facility] [address] [place], [place] [address]  Comprehensive metabolic panel     Status: Abnormal   Collection Time: 11/25/18  9:56 PM  Result Value Ref Range   Sodium 137 135 - 145 mmol/L   Potassium 3.8 3.5 - 5.1 mmol/L   Chloride 102 98 - 111  mmol/L   CO2 26 22 - 32 mmol/L   Glucose, Bld 115 (H) 70 - 99 mg/dL   BUN 13 6 - 20 mg/dL   Creatinine, Ser 0.74 0.44 - 1.00 mg/dL   Calcium 9.5 8.9 - 10.3 mg/dL   Total Protein 6.9 6.5 - 8.1 g/dL   Albumin 3.8 3.5 - 5.0 g/dL   AST 22 15 - 41 U/L   ALT 25 0 - 44 U/L   Alkaline Phosphatase 67 38 - 126 U/L   Total Bilirubin 0.4 0.3 - 1.2 mg/dL   GFR calc non Af Amer >60 >60 mL/min   GFR calc Af Amer >60 >60 mL/min   Anion gap 9 5 - 15    Comment: Performed at [facility] [address] [place], [place] [address]  CBC WITH DIFFERENTIAL     Status: Abnormal   Collection Time: 11/25/18  9:56 PM  Result Value Ref Range   WBC 8.4 4.0 - 10.5 K/uL   RBC 3.53 (L) 3.87 - 5.11 MIL/uL   Hemoglobin 12.0 12.0 - 15.0 g/dL   HCT 35.4 (L) 36.0 - 46.0 %   MCV 100.3 (H) 80.0 - 100.0 fL   MCH 34.0 26.0 - 34.0 pg    MCHC 33.9 30.0 - 36.0 g/dL   RDW 11.9 11.5 - 15.5 %   Platelets 260 150 - 400 K/uL   nRBC 0.0 0.0 - 0.2 %   Neutrophils Relative % 75 %   Neutro Abs 6.2 1.7 - 7.7 K/uL   Lymphocytes Relative 13 %   Lymphs Abs 1.1 0.7 - 4.0 K/uL   Monocytes Relative 8 %   Monocytes Absolute 0.7 0.1 - 1.0 K/uL   Eosinophils Relative 3 %   Eosinophils Absolute 0.3 0.0 - 0.5 K/uL   Basophils Relative 1 %   Basophils Absolute 0.0 0.0 - 0.1 K/uL   Immature Granulocytes 0 %   Abs Immature Granulocytes 0.02 0.00 - 0.07 K/uL    Comment: Performed at [facility] [address], [place], [place] [address]  Urinalysis, Routine w reflex microscopic     Status: Abnormal   Collection Time: 11/25/18 10:45 PM  Result Value Ref Range   Color, Urine YELLOW YELLOW   APPearance CLEAR CLEAR   Specific Gravity, Urine 1.013 1.005 - 1.030   pH 6.0 5.0 - 8.0   Glucose, UA NEGATIVE NEGATIVE mg/dL   Hgb urine dipstick NEGATIVE NEGATIVE   Bilirubin Urine NEGATIVE NEGATIVE   Ketones, ur NEGATIVE NEGATIVE mg/dL   Protein, ur NEGATIVE NEGATIVE mg/dL   Nitrite NEGATIVE NEGATIVE   Leukocytes,Ua TRACE (A) NEGATIVE   RBC / HPF 0-5 0 - 5 RBC/hpf   WBC, UA 0-5 0 - 5 WBC/hpf   Bacteria, UA NONE SEEN NONE SEEN   Squamous Epithelial / LPF 0-5 0 - 5    Comment: Performed at [facility], [place] [address], [place], [place] [address]      ImagingResults(Last48hours)  No results found.    ROS - all of the below systems have been reviewed with the patient and positives are indicated with bold text General: chills, fever or night sweats Eyes: blurry vision or double vision ENT: epistaxis or sore throat Allergy/Immunology: itchy/watery eyes or nasal congestion Hematologic/Lymphatic: bleeding problems, blood clots or swollen lymph nodes Endocrine: temperature intolerance or unexpected weight changes Breast: new or changing breast lumps or nipple discharge Resp: cough, shortness  of breath, or wheezing CV: chest pain or dyspnea on  exertion GI: as per HPI GU: dysuria, trouble voiding, or hematuria MSK: joint pain or joint stiffness Neuro: TIA or stroke symptoms Derm: pruritus and skin lesion changes Psych: anxiety and depression  PE Blood pressure 110/75, pulse (!) 105, temperature 99.3 F (37.4 C), temperature source Oral, resp. rate 14, height 5\' 7"  (1.702 m), weight 89.7 kg, SpO2 95 %. Constitutional: NAD; conversant Eyes: Moist conjunctiva Lungs: Normal respiratory effort CV: RRR; GI: Abd soft, NT/ND Skin: Some blanching erythema along right chest wall; incisions intact and without drainage. JPs in place bilaterally with thin serous output. **A chaperone, Holly Wiley, was present for the entire encounter  LabResultsLast48Hours        Results for orders placed or performed during the hospital encounter of 11/25/18 (from the past 48 hour(s))  SARS Coronavirus 2 (CEPHEID - Performed in [facility] hospital lab), Hosp Order     Status: None   Collection Time: 11/25/18  9:22 PM  Result Value Ref Range   SARS Coronavirus 2 NEGATIVE NEGATIVE    Comment: (NOTE) If result is NEGATIVE SARS-CoV-2 target nucleic acids are NOT DETECTED. The SARS-CoV-2 RNA is generally detectable in upper and lower  respiratory specimens during the acute phase of infection. The lowest  concentration of SARS-CoV-2 viral copies this assay can detect is 250  copies / mL. A negative result does not preclude SARS-CoV-2 infection  and should not be used as the sole basis for treatment or other  patient management decisions.  A negative result may occur with  improper specimen collection / handling, submission of specimen other  than nasopharyngeal swab, presence of viral mutation(s) within the  areas targeted by this assay, and inadequate number of viral copies  (<250 copies / mL). A negative result must be combined with clinical  observations, patient history, and  epidemiological information. If result is POSITIVE SARS-CoV-2 target nucleic acids are DETECTED. The SARS-CoV-2 RNA is generally detectable in upper and lower  respiratory specimens dur ing the acute phase of infection.  Positive  results are indicative of active infection with SARS-CoV-2.  Clinical  correlation with patient history and other diagnostic information is  necessary to determine patient infection status.  Positive results do  not rule out bacterial infection or co-infection with other viruses. If result is PRESUMPTIVE POSTIVE SARS-CoV-2 nucleic acids MAY BE PRESENT.   A presumptive positive result was obtained on the submitted specimen  and confirmed on repeat testing.  While 2019 novel coronavirus  (SARS-CoV-2) nucleic acids may be present in the submitted sample  additional confirmatory testing may be necessary for epidemiological  and / or clinical management purposes  to differentiate between  SARS-CoV-2 and other Sarbecovirus currently known to infect humans.  If clinically indicated additional testing with an alternate test  methodology [phone]) is advised. The SARS-CoV-2 RNA is generally  detectable in upper and lower respiratory sp ecimens during the acute  phase of infection. The expected result is Negative. Fact Sheet for Patients:  StrictlyIdeas.no Fact Sheet for Healthcare Providers: BankingDealers.co.za This test is not yet approved or cleared by the [place] FDA and has been authorized for detection and/or diagnosis of SARS-CoV-2 by FDA under an Emergency Use Authorization (EUA).  This EUA will remain in effect (meaning this test can be used) for the duration of the COVID-19 declaration under Section 564(b)(1) of the Act, 21 U.S.C. section 360bbb-3(b)(1), unless the authorization is terminated or revoked sooner. Performed at [facility], [place] [address], [place], [place] [address]  Lactic acid,  plasma     Status: None   Collection Time: 11/25/18  9:56 PM  Result Value Ref Range   Lactic Acid, Venous 1.0 0.5 - 1.9 mmol/L    Comment: Performed at [facility] [address], [place], [place] [address]  Comprehensive metabolic panel     Status: Abnormal   Collection Time: 11/25/18  9:56 PM  Result Value Ref Range   Sodium 137 135 - 145 mmol/L   Potassium 3.8 3.5 - 5.1 mmol/L   Chloride 102 98 - 111 mmol/L   CO2 26 22 - 32 mmol/L   Glucose, Bld 115 (H) 70 - 99 mg/dL   BUN 13 6 - 20 mg/dL   Creatinine, Ser 0.74 0.44 - 1.00 mg/dL   Calcium 9.5 8.9 - 10.3 mg/dL   Total Protein 6.9 6.5 - 8.1 g/dL   Albumin 3.8 3.5 - 5.0 g/dL   AST 22 15 - 41 U/L   ALT 25 0 - 44 U/L   Alkaline Phosphatase 67 38 - 126 U/L   Total Bilirubin 0.4 0.3 - 1.2 mg/dL   GFR calc non Af Amer >60 >60 mL/min   GFR calc Af Amer >60 >60 mL/min   Anion gap 9 5 - 15    Comment: Performed at [facility] [address], [place], [place] [address]  CBC WITH DIFFERENTIAL     Status: Abnormal   Collection Time: 11/25/18  9:56 PM  Result Value Ref Range   WBC 8.4 4.0 - 10.5 K/uL   RBC 3.53 (L) 3.87 - 5.11 MIL/uL   Hemoglobin 12.0 12.0 - 15.0 g/dL   HCT 35.4 (L) 36.0 - 46.0 %   MCV 100.3 (H) 80.0 - 100.0 fL   MCH 34.0 26.0 - 34.0 pg   MCHC 33.9 30.0 - 36.0 g/dL   RDW 11.9 11.5 - 15.5 %   Platelets 260 150 - 400 K/uL   nRBC 0.0 0.0 - 0.2 %   Neutrophils Relative % 75 %   Neutro Abs 6.2 1.7 - 7.7 K/uL   Lymphocytes Relative 13 %   Lymphs Abs 1.1 0.7 - 4.0 K/uL   Monocytes Relative 8 %   Monocytes Absolute 0.7 0.1 - 1.0 K/uL   Eosinophils Relative 3 %   Eosinophils Absolute 0.3 0.0 - 0.5 K/uL   Basophils Relative 1 %   Basophils Absolute 0.0 0.0 - 0.1 K/uL   Immature Granulocytes 0 %   Abs Immature Granulocytes 0.02 0.00 - 0.07 K/uL    Comment: Performed at [facility] [address] [place], [place] [address]  Urinalysis, Routine w  reflex microscopic     Status: Abnormal   Collection Time: 11/25/18 10:45 PM  Result Value Ref Range   Color, Urine YELLOW YELLOW   APPearance CLEAR CLEAR   Specific Gravity, Urine 1.013 1.005 - 1.030   pH 6.0 5.0 - 8.0   Glucose, UA NEGATIVE NEGATIVE mg/dL   Hgb urine dipstick NEGATIVE NEGATIVE   Bilirubin Urine NEGATIVE NEGATIVE   Ketones, ur NEGATIVE NEGATIVE mg/dL   Protein, ur NEGATIVE NEGATIVE mg/dL   Nitrite NEGATIVE NEGATIVE   Leukocytes,Ua TRACE (A) NEGATIVE   RBC / HPF 0-5 0 - 5 RBC/hpf   WBC, UA 0-5 0 - 5 WBC/hpf   Bacteria, UA NONE SEEN NONE SEEN   Squamous Epithelial / LPF 0-5 0 - 5    Comment: Performed at [facility], [place] [address] [place], [place]  [address]      ImagingResults(Last48hours)  No results found.     A/P: AMANDAJO GONDER is an 43 y.o. female s/p bilateral TM and R SLNBx 5/8 here with right chest wall pain, cellulitis, possible abscess vs hematoma on right side  -Given her systemic signs, significant pain associated with this a this occurring rather acutely, will admit and start IV abx -MIVF -NPO -We will discuss her case further with Dr. Dalbert Batman today as to next steps be it with IR for aspiration vs continue course of abx  Holly Wiley, M.D. [facility]

## 2018-11-27 NOTE — Progress Notes (Signed)
[facility]  Date of Admission:  11/25/2018     Total days of antibiotics 2         ASSESSMENT/PLAN  Holly Wiley has a postsurgical soft tissue infection/mastitis following bilateral mastectomies on 5/8 with JP drain removal yesterday.  Interventional radiology aspirated 10 cc of fluid with culture pending.  Previous cultures obtained remain intubated for further growth. Blood cultures without growth in <24 hours.   Postop soft tissue infection/mastitis -having increased pain and feeling malaise today.  Awaiting culture results to narrow antibiotics as able.  Continue broad-spectrum coverage with vancomycin and cefepime for now.  Wound care per general surgery.  Therapeutic drug monitoring - renal function stable with no evidence of renal toxicity.  Continue to monitor renal function while on vancomycin with pharmacy dosing for area under the curve.   Principal Problem:   Postoperative wound infection Active Problems:   Breast cancer ([place])   . diphenhydrAMINE  25 mg Intravenous Once  . enoxaparin (LOVENOX) injection  40 mg Subcutaneous Q24H  . gabapentin  900 mg Oral QID  . senna-docusate  1 tablet Oral BID  . sodium chloride flush  10-40 mL Intracatheter Q12H    SUBJECTIVE:  Mildly elevated temperature 100.8 over the last 24 hours with no leukocytosis.  Not feeling well today describing a general malaise and increased tenderness on the right side of her chest.  Allergies  Allergen Reactions  . Bee Venom Anaphylaxis  . Dihydroergotamine Anaphylaxis  . Imitrex [Sumatriptan] Other (See Comments)    SEIZURE-LIKE ACTIVITY  . Metoclopramide Other (See Comments)    TACHYCARDIA  . Transderm-Scop [Scopolamine] Other (See Comments)    AGITATION     Review of Systems: Review of Systems  Constitutional: Positive for malaise/fatigue. Negative for chills and fever.  Respiratory: Negative for cough, shortness of breath and wheezing.   Cardiovascular:  Positive for chest pain (Chest wall). Negative for leg swelling.  Gastrointestinal: Negative for abdominal pain, constipation, diarrhea, nausea and vomiting.    OBJECTIVE: Vitals:   11/26/18 1634 11/26/18 2031 11/27/18 0001 11/27/18 0433  BP: 113/63 111/71 107/63 (!) 108/58  Pulse: 96 95 (!) 101 92  Resp: 17 16 17 16   Temp: (!) 100.8 F (38.2 C) (!) 100.5 F (38.1 C) 98.9 F (37.2 C) 99.8 F (37.7 C)  TempSrc: Oral Oral Oral Oral  SpO2: 95% 98% 94% 96%  Weight:      Height:       Body mass index is 30.98 kg/m.  Physical Exam Constitutional:      General: Holly Wiley is not in acute distress.    Appearance: Holly Wiley is well-developed. Holly Wiley is ill-appearing.     Comments: Lying in bed with head of bed elevated; pleasant.   Cardiovascular:     Rate and Rhythm: Normal rate and regular rhythm.     Heart sounds: Normal heart sounds.  Pulmonary:     Effort: Pulmonary effort is normal.     Breath sounds: Normal breath sounds.  Skin:    General: Skin is warm and dry.  Neurological:     Mental Status: Holly Wiley is alert and oriented to person, place, and time.  Psychiatric:        Mood and Affect: Mood normal.     Lab Results Lab Results  Component Value Date   WBC 8.5 11/27/2018   HGB 9.9 (L) 11/27/2018   HCT 29.8 (L) 11/27/2018   MCV 101.7 (H) 11/27/2018   PLT 165 11/27/2018  Lab Results  Component Value Date   CREATININE 0.67 11/27/2018   BUN 6 11/27/2018   NA 135 11/27/2018   K 3.3 (L) 11/27/2018   CL 103 11/27/2018   CO2 24 11/27/2018    Lab Results  Component Value Date   ALT 25 11/25/2018   AST 22 11/25/2018   ALKPHOS 67 11/25/2018   BILITOT 0.4 11/25/2018     Microbiology: Recent Results (from the past 240 hour(s))  SARS Coronavirus 2 (CEPHEID - Performed in [facility] hospital lab), Hosp Order     Status: None   Collection Time: 11/25/18  9:22 PM  Result Value Ref Range Status   SARS Coronavirus 2 NEGATIVE NEGATIVE Final    Comment: (NOTE) If result is  NEGATIVE SARS-CoV-2 target nucleic acids are NOT DETECTED. The SARS-CoV-2 RNA is generally detectable in upper and lower  respiratory specimens during the acute phase of infection. The lowest  concentration of SARS-CoV-2 viral copies this assay can detect is 250  copies / mL. A negative result does not preclude SARS-CoV-2 infection  and should not be used as the sole basis for treatment or other  patient management decisions.  A negative result may occur with  improper specimen collection / handling, submission of specimen other  than nasopharyngeal swab, presence of viral mutation(s) within the  areas targeted by this assay, and inadequate number of viral copies  (<250 copies / mL). A negative result must be combined with clinical  observations, patient history, and epidemiological information. If result is POSITIVE SARS-CoV-2 target nucleic acids are DETECTED. The SARS-CoV-2 RNA is generally detectable in upper and lower  respiratory specimens dur ing the acute phase of infection.  Positive  results are indicative of active infection with SARS-CoV-2.  Clinical  correlation with patient history and other diagnostic information is  necessary to determine patient infection status.  Positive results do  not rule out bacterial infection or co-infection with other viruses. If result is PRESUMPTIVE POSTIVE SARS-CoV-2 nucleic acids MAY BE PRESENT.   A presumptive positive result was obtained on the submitted specimen  and confirmed on repeat testing.  While 2019 novel coronavirus  (SARS-CoV-2) nucleic acids may be present in the submitted sample  additional confirmatory testing may be necessary for epidemiological  and / or clinical management purposes  to differentiate between  SARS-CoV-2 and other Sarbecovirus currently known to infect humans.  If clinically indicated additional testing with an alternate test  methodology [phone]) is advised. The SARS-CoV-2 RNA is generally  detectable  in upper and lower respiratory sp ecimens during the acute  phase of infection. The expected result is Negative. Fact Sheet for Patients:  StrictlyIdeas.no Fact Sheet for Healthcare Providers: BankingDealers.co.za This test is not yet approved or cleared by the [place] FDA and has been authorized for detection and/or diagnosis of SARS-CoV-2 by FDA under an Emergency Use Authorization (EUA).  This EUA will remain in effect (meaning this test can be used) for the duration of the COVID-19 declaration under Section 564(b)(1) of the Act, 21 U.S.C. section 360bbb-3(b)(1), unless the authorization is terminated or revoked sooner. Performed at [facility], [place] [address], [place], [place] [address]   Blood Culture (routine x 2)     Status: None (Preliminary result)   Collection Time: 11/25/18  9:34 PM  Result Value Ref Range Status   Specimen Description BLOOD LEFT HAND  Final   Special Requests   Final    BOTTLES DRAWN AEROBIC AND ANAEROBIC Blood Culture  results may not be optimal due to an excessive volume of blood received in culture bottles   Culture   Final    NO GROWTH < 24 HOURS Performed at [facility] [address], [place], [place] [address]    Report Status PENDING  Incomplete  Blood Culture (routine x 2)     Status: None (Preliminary result)   Collection Time: 11/25/18  9:54 PM  Result Value Ref Range Status   Specimen Description BLOOD LEFT HAND  Final   Special Requests   Final    BOTTLES DRAWN AEROBIC ONLY Blood Culture results may not be optimal due to an excessive volume of blood received in culture bottles   Culture   Final    NO GROWTH < 24 HOURS Performed at [facility], [place] [address], [place], [place] [address]    Report Status PENDING  Incomplete  Aerobic/Anaerobic Culture (surgical/deep wound)     Status: None (Preliminary result)   Collection Time: 11/25/18 10:47 PM  Result Value Ref  Range Status   Specimen Description WOUND  Final   Special Requests DRAIN IS MALODOROUS  Final   Gram Stain   Final    MODERATE WBC PRESENT, PREDOMINANTLY PMN ABUNDANT GRAM NEGATIVE RODS MODERATE GRAM POSITIVE COCCI FEW GRAM POSITIVE RODS    Culture   Final    CULTURE REINCUBATED FOR BETTER GROWTH Performed at [facility], [place] [address] [place], [place] [address]    Report Status PENDING  Incomplete  Aerobic/Anaerobic Culture (surgical/deep wound)     Status: None (Preliminary result)   Collection Time: 11/26/18 12:04 PM  Result Value Ref Range Status   Specimen Description BREAST  Final   Special Requests RIGHT  Final   Gram Stain   Final    RARE WBC PRESENT, PREDOMINANTLY PMN NO ORGANISMS SEEN Performed at [facility], [place] [address], [place],  [address]    Culture PENDING  Incomplete   Report Status PENDING  Incomplete     Terri Piedra, NP [facility] [phone] Pager  11/27/2018  11:18 AM

## 2018-11-28 ENCOUNTER — Inpatient Hospital Stay (HOSPITAL_COMMUNITY): Payer: BLUE CROSS/BLUE SHIELD | Admitting: Certified Registered Nurse Anesthetist

## 2018-11-28 ENCOUNTER — Encounter (HOSPITAL_COMMUNITY)
Admission: EM | Disposition: A | Discharge status: Home Health | Payer: Self-pay | Source: Home / Self Care | Attending: General Surgery

## 2018-11-28 ENCOUNTER — Encounter (HOSPITAL_COMMUNITY): Payer: Self-pay

## 2018-11-28 DIAGNOSIS — Z9011 Acquired absence of right breast and nipple: Secondary | ICD-10-CM

## 2018-11-28 DIAGNOSIS — T8149XA Infection following a procedure, other surgical site, initial encounter: Secondary | ICD-10-CM | POA: Diagnosis present

## 2018-11-28 DIAGNOSIS — B9561 Methicillin susceptible Staphylococcus aureus infection as the cause of diseases classified elsewhere: Secondary | ICD-10-CM

## 2018-11-28 HISTORY — PX: IRRIGATION AND DEBRIDEMENT ABSCESS: SHX5252

## 2018-11-28 HISTORY — DX: Infection following a procedure, other surgical site, initial encounter: T81.49XA

## 2018-11-28 LAB — BASIC METABOLIC PANEL
Anion gap: 12 (ref 5–15)
BUN: <5 mg/dL — ABNORMAL LOW (ref 6–20)
CO2: 26 mmol/L (ref 22–32)
Calcium: 9.2 mg/dL (ref 8.9–10.3)
Chloride: 100 mmol/L (ref 98–111)
Creatinine, Ser: 0.66 mg/dL (ref 0.44–1.00)
GFR calc Af Amer: >60 mL/min (ref >60)
GFR calc non Af Amer: >60 mL/min (ref >60)
Glucose, Bld: 91 mg/dL (ref 70–99)
Potassium: 4 mmol/L (ref 3.5–5.1)
Sodium: 138 mmol/L (ref 135–145)

## 2018-11-28 LAB — CBC
HCT: 31.6 % — ABNORMAL LOW (ref 36.0–46.0)
Hemoglobin: 10.2 g/dL — ABNORMAL LOW (ref 12.0–15.0)
MCH: 32.8 pg (ref 26.0–34.0)
MCHC: 32.3 g/dL (ref 30.0–36.0)
MCV: 101.6 fL — ABNORMAL HIGH (ref 80.0–100.0)
Platelets: 194 10*3/uL (ref 150–400)
RBC: 3.11 MIL/uL — ABNORMAL LOW (ref 3.87–5.11)
RDW: 11.8 % (ref 11.5–15.5)
WBC: 6.8 10*3/uL (ref 4.0–10.5)
nRBC: 0 % (ref 0.0–0.2)

## 2018-11-28 LAB — SURGICAL PCR SCREEN
MRSA, PCR: NEGATIVE
Staphylococcus aureus: POSITIVE — AB

## 2018-11-28 SURGERY — IRRIGATION AND DEBRIDEMENT ABSCESS
Anesthesia: General | Laterality: Right

## 2018-11-28 MED ORDER — PHENYLEPHRINE 40 MCG/ML (10ML) SYRINGE FOR IV PUSH (FOR BLOOD PRESSURE SUPPORT)
PREFILLED_SYRINGE | INTRAVENOUS | Status: AC
  Filled 2018-11-28: qty 10

## 2018-11-28 MED ORDER — MEPERIDINE HCL 25 MG/ML IJ SOLN: 6.2500 mg | INTRAMUSCULAR | Status: DC | PRN

## 2018-11-28 MED ORDER — OXYCODONE HCL 5 MG/5ML PO SOLN: 5.0000 mg | Freq: Once | ORAL | Status: AC | PRN

## 2018-11-28 MED ORDER — PHENYLEPHRINE HCL (PRESSORS) 10 MG/ML IV SOLN
INTRAVENOUS | Status: DC | PRN
  Administered 2018-11-28: 80 ug via INTRAVENOUS

## 2018-11-28 MED ORDER — PROPOFOL 10 MG/ML IV BOLUS
INTRAVENOUS | Status: DC | PRN
  Administered 2018-11-28: 200 mg via INTRAVENOUS

## 2018-11-28 MED ORDER — ENOXAPARIN SODIUM 40 MG/0.4ML ~~LOC~~ SOLN: 40.0000 mg | SUBCUTANEOUS | Status: DC

## 2018-11-28 MED ORDER — PROPOFOL 10 MG/ML IV BOLUS
INTRAVENOUS | Status: AC
  Filled 2018-11-28: qty 20

## 2018-11-28 MED ORDER — DEXAMETHASONE SODIUM PHOSPHATE 10 MG/ML IJ SOLN
INTRAMUSCULAR | Status: DC | PRN
  Administered 2018-11-28: 10 mg via INTRAVENOUS

## 2018-11-28 MED ORDER — OXYCODONE HCL 5 MG PO TABS
5.0000 mg | ORAL_TABLET | Freq: Once | ORAL | Status: AC | PRN
  Administered 2018-11-28: 12:00:00 5 mg via ORAL

## 2018-11-28 MED ORDER — FENTANYL CITRATE (PF) 250 MCG/5ML IJ SOLN
INTRAMUSCULAR | Status: AC
  Filled 2018-11-28: qty 5

## 2018-11-28 MED ORDER — LIDOCAINE 2% (20 MG/ML) 5 ML SYRINGE
INTRAMUSCULAR | Status: DC | PRN
  Administered 2018-11-28: 100 mg via INTRAVENOUS

## 2018-11-28 MED ORDER — FENTANYL CITRATE (PF) 250 MCG/5ML IJ SOLN
INTRAMUSCULAR | Status: DC | PRN
  Administered 2018-11-28 (×2): 50 ug via INTRAVENOUS

## 2018-11-28 MED ORDER — HYDROMORPHONE HCL 1 MG/ML IJ SOLN
0.2500 mg | INTRAMUSCULAR | Status: DC | PRN
  Administered 2018-11-28 (×4): 0.5 mg via INTRAVENOUS

## 2018-11-28 MED ORDER — MIDAZOLAM HCL 2 MG/2ML IJ SOLN
INTRAMUSCULAR | Status: DC | PRN
  Administered 2018-11-28: 2 mg via INTRAVENOUS

## 2018-11-28 MED ORDER — METHOCARBAMOL 500 MG PO TABS: 500.0000 mg | ORAL_TABLET | Freq: Four times a day (QID) | ORAL | Status: DC | PRN

## 2018-11-28 MED ORDER — ONDANSETRON HCL 4 MG/2ML IJ SOLN
INTRAMUSCULAR | Status: DC | PRN
  Administered 2018-11-28: 4 mg via INTRAVENOUS

## 2018-11-28 MED ORDER — DIPHENHYDRAMINE HCL 50 MG/ML IJ SOLN
INTRAMUSCULAR | Status: DC | PRN
  Administered 2018-11-28: 12.5 mg via INTRAVENOUS

## 2018-11-28 MED ORDER — HYDROMORPHONE HCL 1 MG/ML IJ SOLN
INTRAMUSCULAR | Status: AC
  Filled 2018-11-28: qty 1

## 2018-11-28 MED ORDER — LACTATED RINGERS IV SOLN
INTRAVENOUS | Status: DC | PRN
  Administered 2018-11-28: 09:00:00 via INTRAVENOUS

## 2018-11-28 MED ORDER — ONDANSETRON HCL 4 MG/2ML IJ SOLN
INTRAMUSCULAR | Status: AC
  Filled 2018-11-28: qty 2

## 2018-11-28 MED ORDER — MIDAZOLAM HCL 2 MG/2ML IJ SOLN
INTRAMUSCULAR | Status: AC
  Filled 2018-11-28: qty 2

## 2018-11-28 MED ORDER — PROMETHAZINE HCL 25 MG/ML IJ SOLN: 6.2500 mg | INTRAMUSCULAR | Status: DC | PRN

## 2018-11-28 MED ORDER — 0.9 % SODIUM CHLORIDE (POUR BTL) OPTIME
TOPICAL | Status: DC | PRN
  Administered 2018-11-28: 10:00:00 1000 mL

## 2018-11-28 MED ORDER — OXYCODONE HCL 5 MG PO TABS
ORAL_TABLET | ORAL | Status: AC
  Filled 2018-11-28: qty 1

## 2018-11-28 MED ORDER — LACTATED RINGERS IV SOLN
INTRAVENOUS | Status: DC
  Administered 2018-11-28: 14:00:00 via INTRAVENOUS
  Administered 2018-11-29: 23:00:00 100 mL/h via INTRAVENOUS

## 2018-11-28 MED ORDER — DEXAMETHASONE SODIUM PHOSPHATE 10 MG/ML IJ SOLN
INTRAMUSCULAR | Status: AC
  Filled 2018-11-28: qty 1

## 2018-11-28 MED ORDER — PROPOFOL 500 MG/50ML IV EMUL
INTRAVENOUS | Status: DC | PRN
  Administered 2018-11-28: 25 ug/kg/min via INTRAVENOUS

## 2018-11-28 MED ORDER — SCOPOLAMINE 1 MG/3DAYS TD PT72
MEDICATED_PATCH | TRANSDERMAL | Status: AC
  Filled 2018-11-28: qty 1

## 2018-11-28 MED ORDER — LIDOCAINE 2% (20 MG/ML) 5 ML SYRINGE
INTRAMUSCULAR | Status: AC
  Filled 2018-11-28: qty 5

## 2018-11-28 MED ORDER — DIPHENHYDRAMINE HCL 50 MG/ML IJ SOLN
INTRAMUSCULAR | Status: AC
  Filled 2018-11-28: qty 1

## 2018-11-28 SURGICAL SUPPLY — 22 items
BINDER BREAST XLRG (GAUZE/BANDAGES/DRESSINGS) ×2 IMPLANT
BNDG GAUZE ELAST 4 BULKY (GAUZE/BANDAGES/DRESSINGS) ×4 IMPLANT
CANISTER SUCT 3000ML PPV (MISCELLANEOUS) ×3 IMPLANT
CHLORAPREP W/TINT 26ML (MISCELLANEOUS) ×2 IMPLANT
COVER SURGICAL LIGHT HANDLE (MISCELLANEOUS) ×3 IMPLANT
COVER WAND RF STERILE (DRAPES) ×3 IMPLANT
DRAPE LAPAROSCOPIC ABDOMINAL (DRAPES) ×3 IMPLANT
ELECT REM PT RETURN 9FT ADLT (ELECTROSURGICAL) ×3
ELECTRODE REM PT RTRN 9FT ADLT (ELECTROSURGICAL) ×1 IMPLANT
GAUZE SPONGE 4X4 12PLY STRL (GAUZE/BANDAGES/DRESSINGS) ×3 IMPLANT
GLOVE EUDERMIC 7 POWDERFREE (GLOVE) ×3 IMPLANT
GOWN STRL REUS W/ TWL XL LVL3 (GOWN DISPOSABLE) ×1 IMPLANT
GOWN STRL REUS W/TWL XL LVL3 (GOWN DISPOSABLE) ×6
KIT BASIN OR (CUSTOM PROCEDURE TRAY) ×3 IMPLANT
KIT TURNOVER KIT B (KITS) ×3 IMPLANT
NS IRRIG 1000ML POUR BTL (IV SOLUTION) ×3 IMPLANT
PACK GENERAL/GYN (CUSTOM PROCEDURE TRAY) ×3 IMPLANT
PAD ABD 8X10 STRL (GAUZE/BANDAGES/DRESSINGS) ×4 IMPLANT
PAD ARMBOARD 7.5X6 YLW CONV (MISCELLANEOUS) ×4 IMPLANT
SWAB COLLECTION DEVICE MRSA (MISCELLANEOUS) ×3 IMPLANT
SWAB CULTURE ESWAB REG 1ML (MISCELLANEOUS) ×3 IMPLANT
TOWEL OR 17X26 10 PK STRL BLUE (TOWEL DISPOSABLE) ×3 IMPLANT

## 2018-11-28 NOTE — H&P (View-Only) (Signed)
Subjective: Awake and alert.  Stable cooperative with reasonably good spirits. Continues to have pain and some swelling and tenderness right lateral chest wall Continues with nausea Fever back to 102.6 Blood cultures remain negative Cultures of fluid taken from drain on admission is polymicrobial, and include Staphylococcus aureus and stenotrophomonas maltophilia Cultures from wound aspiration and radiology show no growth at 1 day MRSA screen negative Morning labs pending   Objective: Vital signs in last 24 hours: Temp:  [98.5 F (36.9 C)-102.6 F (39.2 C)] 98.7 F (37.1 C) (05/30 0431) Pulse Rate:  [93-107] 93 (05/30 0431) Resp:  [17-18] 18 (05/30 0431) BP: (103-130)/(49-88) 103/67 (05/30 0431) SpO2:  [93 %-97 %] 96 % (05/30 0431) Last BM Date: 11/25/18  Intake/Output from previous day: 05/29 0701 - 05/30 0700 In: 300 [I.V.:200; IV Piggyback:100] Out: 400 [Urine:400] Intake/Output this shift: Total I/O In: 300 [I.V.:200; IV Piggyback:100] Out: 400 [Urine:400]  General appearance: Alert.  Mental status normal.  Good insight.  Does not look toxic, just fatigue Resp: clear to auscultation bilaterally Chest wall: no tenderness, Right right lateral chest wall shows continued swelling, mild erythema, tenderness.  This appears to be under the lateral skin flap.  Skin remains viable without necrosis.  No odor or drainage.  Not fluctuant.  Medial aspect of right mastectomy wound looks good.  Left mastectomy wound looks good. GI: soft, non-tender; bowel sounds normal; no masses,  no organomegaly  Lab Results:  Recent Labs    11/26/18 0357 11/27/18 0450  WBC 9.8 8.5  HGB 9.5* 9.9*  HCT 27.4* 29.8*  PLT 230 165   BMET Recent Labs    11/25/18 2156 11/27/18 0450  NA 137 135  K 3.8 3.3*  CL 102 103  CO2 26 24  GLUCOSE 115* 172*  BUN 13 6  CREATININE 0.74 0.67  CALCIUM 9.5 8.4*   PT/INR Recent Labs    11/26/18 1004  LABPROT 14.2  INR 1.1   ABG No results for  input(s): PHART, HCO3 in the last 72 hours.  Invalid input(s): PCO2, PO2  Studies/Results: US Guided Needle Placement  Result Date: 11/26/2018 INDICATION: Right mastectomy site chest wall fluid collection, concern for mastitis and abscess EXAM: ULTRASOUND ASPIRATION LEFT CHEST WALL FLUID COLLECTION MEDICATIONS: The patient is currently admitted to the hospital and receiving intravenous antibiotics. The antibiotics were administered within an appropriate time frame prior to the initiation of the procedure. ANESTHESIA/SEDATION: Fentanyl 50 mcg IV; Versed 1.0 mg IV Moderate Sedation Time:  11 MINUTES The patient was continuously monitored during the procedure by the interventional radiology nurse under my direct supervision. COMPLICATIONS: None immediate. PROCEDURE: Informed written consent was obtained from the patient after a thorough discussion of the procedural risks, benefits and alternatives. All questions were addressed. Maximal Sterile Barrier Technique was utilized including caps, mask, sterile gowns, sterile gloves, sterile drape, hand hygiene and skin antiseptic. A timeout was performed prior to the initiation of the procedure. Previous imaging reviewed. Preliminary ultrasound performed of the right mastectomy site. There is a minimally complex hypoechoic elongated fluid collection along the deep chest wall which correlates with the CT finding. Under sterile conditions and local anesthesia, a 17 gauge access needle was advanced percutaneously into the fluid collection. Needle position confirmed with ultrasound. Syringe aspiration yielded only 10 cc thin bloody serosanguineous fluid. No purulence component. Sample sent culture. Needle removed. IMPRESSION: Successful ultrasound right chest wall mastectomy site fluid collection aspiration. Sample sent for culture. Electronically Signed   By: Jerilynn Mages.  Shick M.D.  On: 11/26/2018 12:07    Anti-infectives: Anti-infectives (From admission, onward)   Start      Dose/Rate Route Frequency Ordered Stop   11/26/18 1400  ceFEPIme (MAXIPIME) 2 g in sodium chloride 0.9 % 100 mL IVPB     2 g 200 mL/hr over 30 Minutes Intravenous Every 8 hours 11/26/18 1328     11/26/18 1000  vancomycin (VANCOCIN) 1,250 mg in sodium chloride 0.9 % 250 mL IVPB     1,250 mg 166.7 mL/hr over 90 Minutes Intravenous Every 12 hours 11/26/18 0913     11/26/18 0230  vancomycin (VANCOCIN) IVPB 1000 mg/200 mL premix  Status:  Discontinued     1,000 mg 200 mL/hr over 60 Minutes Intravenous  Once 11/26/18 0223 11/26/18 0910   11/26/18 0100  piperacillin-tazobactam (ZOSYN) IVPB 3.375 g  Status:  Discontinued     3.375 g 12.5 mL/hr over 240 Minutes Intravenous Every 8 hours 11/26/18 0059 11/26/18 1326   11/25/18 2230  vancomycin (VANCOCIN) 1,750 mg in sodium chloride 0.9 % 500 mL IVPB     1,750 mg 250 mL/hr over 120 Minutes Intravenous  Once 11/25/18 2126 11/26/18 0250   11/25/18 2130  ceFEPIme (MAXIPIME) 2 g in sodium chloride 0.9 % 100 mL IVPB     2 g 200 mL/hr over 30 Minutes Intravenous  Once 11/25/18 2121 11/25/18 2243   11/25/18 2130  metroNIDAZOLE (FLAGYL) IVPB 500 mg     500 mg 100 mL/hr over 60 Minutes Intravenous  Once 11/25/18 2121 11/26/18 0038   11/25/18 2130  vancomycin (VANCOCIN) IVPB 1000 mg/200 mL premix  Status:  Discontinued     1,000 mg 200 mL/hr over 60 Minutes Intravenous  Once 11/25/18 2121 11/25/18 2126      Assessment/Plan: s/p Procedure(s): Exploration and Drainage of Right Mastectomy wound.   Postop soft tissue infection right chest wall.Complex cellulitis versus developing abscess. -Clinically and radiographically this seems to be cellulitis and induration and there is no drainable abscess -Drains removed 5/28 to eliminate foreign body participation in source control -She is not septic or toxic but symptomatically not improved. -With recurrence of high fever and continued evidence of cellulitis and induration, I think we have to assume that we  do not have source control.  This may be some type of localized synergistic infection that needs debridement. -Plan wound exploration in OR under general anesthesia today.  I plan to open up the mastectomy incisions inframammary and probably vertical and opened the skin flap to completely expose this area we will be packed open -I discussed the indications, details, techniques, and risk of the surgery with the patient in detail.  She has excellent insight and understanding and agrees with this plan. -Continue vancomycin and maxipime.  Adjust pending cultures.  ID following   Right arm neuropathy and neuritis.Suspect this is related to her right axillary sentinel node biopsy. Continue gabapentin.  She says this pain is less than the right chest wall pain. -She did have D5W injection therapy by Dr. Nelva Bush 1 week prior to the onset of this infection in the right upper arm.  Not sure whether this has any relationship  to the infection  Right breast cancer, triple negative -She had a complete pathologic response from her neoadjuvant chemotherapy Status post neoadjuvant chemotherapy -Followed by Dr.Gudena  Constipation.  Add MiraLAX and Senokot Hypokalemia.  IV adjusted.  Labs pending  Macromastia   LOS: 2 days    Holly Wiley 11/28/2018

## 2018-11-28 NOTE — Op Note (Signed)
Patient Name:           Holly Wiley   Date of Surgery:        11/28/2018  Pre op Diagnosis:      Infection right mastectomy wound  Post op Diagnosis:    Infection right mastectomy wound  Procedure:                 Exploration, debridement, and drainage of infection right mastectomy wound  Surgeon:                     Edsel Petrin. Dalbert Batman, M.D., FACS  Assistant:                      OR staff  Operative Indications:   This is a 43 year old female 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  She initially underwent Port-A-Cath insertion and neoadjuvant chemotherapy.  Fortunately she had complete pathologic response.  On Nov 06, 2018 she underwent bilateral mastectomy with skin reduction pattern and right axillary sentinel node biopsy.  She had significant neuropathic pain in the right arm which eventually came under some control.  She required some evaluation by Dr. Suella Broad, physiatrist at [facility].  About 9 days ago she underwent some injection therapy of the right upper inner arm with D5W..  She presented to the hospital on May 27 with pain fever and swelling of the lateral aspect of the right mastectomy wound.  This looked like a cellulitis.  She was admitted and placed on broad-spectrum antibiotics.  Infectious disease was consulted.  Imaging studies did not show a well-defined abscess.  Needle aspiration in radiology revealed only 10 cc of bloody fluid.  We initially treated this as if it was a cellulitis but she began spiking fevers yesterday afternoon to 102 and she continued to have significant pain and induration and tenderness in the right lateral aspect of the wound.  We brought her to the operating room for exploration of the wound under general anesthesia.  Operative Findings:       We opened the lateral aspect of the inframammary incision and part of the vertical aspect of the mastectomy incision.  The soft tissues were viable and a little bit edematous.  When I got to  the plane between the subcutaneous fat and the pectoralis muscle in the axilla there was cloudy fluid throughout.  I opened the space up completely and obtained cultures.  Procedure in Detail:          Following the induction of general LMA anesthesia the patient's right chest wall and axilla were prepped and draped in a sterile fashion.  Intravenous antibiotics were given..  Surgical timeout was performed.  Dissection was performed with a knife and with electrocautery.  I opened up the lateral half of the transverse inframammary incision and some of the vertical incision is well.  This allowed me to go directly down through the soft tissues.  Initially I did not encounter any fluid only to some edema.  When I got to the deeper space found a cloudy fluid and the tissue plane between the subcutaneous tissue and the muscle and axilla was opened with cloudy fluid.  Not much odor.  I did take cultures.  The wound was copiously irrigated.  I spent a good amount of time obtaining hemostasis.  The wound was packed with saline moistened Kerlix, covered with ABDs and a breast binder.  The patient tolerated the procedure  well was taken to PACU in stable condition.  EBL 30 to 40 cc.  Counts correct.  Complications none.     Edsel Petrin. Dalbert Batman, M.D., FACS General and Minimally Invasive Surgery Breast and Colorectal Surgery  11/28/2018 9:57 AM

## 2018-11-28 NOTE — Progress Notes (Signed)
S?P fall Per pt was  vomiting and tried to reach for the trash can leaned on the Lamp stand and the stand slide away and she fell on her knees.    Pt assisted back to bed no apparent injury noted  . Marland Kitchen  Family members already aware as  Mother/ sister was on phone with pt when this happened. [facility] surgery MD  On call notified : Dr Donne Hazel Vs done  Stable  New order for phenergan for nausea given no apparent injury noted. RN will continue to monitor pt.

## 2018-11-28 NOTE — Anesthesia Postprocedure Evaluation (Signed)
Anesthesia Post Note  Patient: Holly Wiley  Procedure(s) Performed: Exploration and Drainage of Right Mastectomy wound. (Right )     Patient location during evaluation: PACU Anesthesia Type: General Level of consciousness: sedated and patient cooperative Pain management: pain level controlled Vital Signs Assessment: post-procedure vital signs reviewed and stable Respiratory status: spontaneous breathing Cardiovascular status: stable Anesthetic complications: no    Last Vitals:  Vitals:   11/28/18 1346 11/28/18 1836  BP: 118/78 135/81  Pulse: 90 98  Resp: 17   Temp: 36.9 C 37.1 C  SpO2: 93% 95%    Last Pain:  Vitals:   11/28/18 1836  TempSrc: Oral  PainSc:                  Nolon Nations

## 2018-11-28 NOTE — Progress Notes (Signed)
Subjective: Awake and alert.  Stable cooperative with reasonably good spirits. Continues to have pain and some swelling and tenderness right lateral chest wall Continues with nausea Fever back to 102.6 Blood cultures remain negative Cultures of fluid taken from drain on admission is polymicrobial, and include Staphylococcus aureus and stenotrophomonas maltophilia Cultures from wound aspiration and radiology show no growth at 1 day MRSA screen negative Morning labs pending   Objective: Vital signs in last 24 hours: Temp:  [98.5 F (36.9 C)-102.6 F (39.2 C)] 98.7 F (37.1 C) (05/30 0431) Pulse Rate:  [93-107] 93 (05/30 0431) Resp:  [17-18] 18 (05/30 0431) BP: (103-130)/(49-88) 103/67 (05/30 0431) SpO2:  [93 %-97 %] 96 % (05/30 0431) Last BM Date: 11/25/18  Intake/Output from previous day: 05/29 0701 - 05/30 0700 In: 300 [I.V.:200; IV Piggyback:100] Out: 400 [Urine:400] Intake/Output this shift: Total I/O In: 300 [I.V.:200; IV Piggyback:100] Out: 400 [Urine:400]  General appearance: Alert.  Mental status normal.  Good insight.  Does not look toxic, just fatigue Resp: clear to auscultation bilaterally Chest wall: no tenderness, Right right lateral chest wall shows continued swelling, mild erythema, tenderness.  This appears to be under the lateral skin flap.  Skin remains viable without necrosis.  No odor or drainage.  Not fluctuant.  Medial aspect of right mastectomy wound looks good.  Left mastectomy wound looks good. GI: soft, non-tender; bowel sounds normal; no masses,  no organomegaly  Lab Results:  Recent Labs    11/26/18 0357 11/27/18 0450  WBC 9.8 8.5  HGB 9.5* 9.9*  HCT 27.4* 29.8*  PLT 230 165   BMET Recent Labs    11/25/18 2156 11/27/18 0450  NA 137 135  K 3.8 3.3*  CL 102 103  CO2 26 24  GLUCOSE 115* 172*  BUN 13 6  CREATININE 0.74 0.67  CALCIUM 9.5 8.4*   PT/INR Recent Labs    11/26/18 1004  LABPROT 14.2  INR 1.1   ABG No results for  input(s): PHART, HCO3 in the last 72 hours.  Invalid input(s): PCO2, PO2  Studies/Results: US Guided Needle Placement  Result Date: 11/26/2018 INDICATION: Right mastectomy site chest wall fluid collection, concern for mastitis and abscess EXAM: ULTRASOUND ASPIRATION LEFT CHEST WALL FLUID COLLECTION MEDICATIONS: The patient is currently admitted to the hospital and receiving intravenous antibiotics. The antibiotics were administered within an appropriate time frame prior to the initiation of the procedure. ANESTHESIA/SEDATION: Fentanyl 50 mcg IV; Versed 1.0 mg IV Moderate Sedation Time:  11 MINUTES The patient was continuously monitored during the procedure by the interventional radiology nurse under my direct supervision. COMPLICATIONS: None immediate. PROCEDURE: Informed written consent was obtained from the patient after a thorough discussion of the procedural risks, benefits and alternatives. All questions were addressed. Maximal Sterile Barrier Technique was utilized including caps, mask, sterile gowns, sterile gloves, sterile drape, hand hygiene and skin antiseptic. A timeout was performed prior to the initiation of the procedure. Previous imaging reviewed. Preliminary ultrasound performed of the right mastectomy site. There is a minimally complex hypoechoic elongated fluid collection along the deep chest wall which correlates with the CT finding. Under sterile conditions and local anesthesia, a 17 gauge access needle was advanced percutaneously into the fluid collection. Needle position confirmed with ultrasound. Syringe aspiration yielded only 10 cc thin bloody serosanguineous fluid. No purulence component. Sample sent culture. Needle removed. IMPRESSION: Successful ultrasound right chest wall mastectomy site fluid collection aspiration. Sample sent for culture. Electronically Signed   By: Jerilynn Mages.  Shick M.D.  On: 11/26/2018 12:07    Anti-infectives: Anti-infectives (From admission, onward)   Start      Dose/Rate Route Frequency Ordered Stop   11/26/18 1400  ceFEPIme (MAXIPIME) 2 g in sodium chloride 0.9 % 100 mL IVPB     2 g 200 mL/hr over 30 Minutes Intravenous Every 8 hours 11/26/18 1328     11/26/18 1000  vancomycin (VANCOCIN) 1,250 mg in sodium chloride 0.9 % 250 mL IVPB     1,250 mg 166.7 mL/hr over 90 Minutes Intravenous Every 12 hours 11/26/18 0913     11/26/18 0230  vancomycin (VANCOCIN) IVPB 1000 mg/200 mL premix  Status:  Discontinued     1,000 mg 200 mL/hr over 60 Minutes Intravenous  Once 11/26/18 0223 11/26/18 0910   11/26/18 0100  piperacillin-tazobactam (ZOSYN) IVPB 3.375 g  Status:  Discontinued     3.375 g 12.5 mL/hr over 240 Minutes Intravenous Every 8 hours 11/26/18 0059 11/26/18 1326   11/25/18 2230  vancomycin (VANCOCIN) 1,750 mg in sodium chloride 0.9 % 500 mL IVPB     1,750 mg 250 mL/hr over 120 Minutes Intravenous  Once 11/25/18 2126 11/26/18 0250   11/25/18 2130  ceFEPIme (MAXIPIME) 2 g in sodium chloride 0.9 % 100 mL IVPB     2 g 200 mL/hr over 30 Minutes Intravenous  Once 11/25/18 2121 11/25/18 2243   11/25/18 2130  metroNIDAZOLE (FLAGYL) IVPB 500 mg     500 mg 100 mL/hr over 60 Minutes Intravenous  Once 11/25/18 2121 11/26/18 0038   11/25/18 2130  vancomycin (VANCOCIN) IVPB 1000 mg/200 mL premix  Status:  Discontinued     1,000 mg 200 mL/hr over 60 Minutes Intravenous  Once 11/25/18 2121 11/25/18 2126      Assessment/Plan: s/p Procedure(s): Exploration and Drainage of Right Mastectomy wound.   Postop soft tissue infection right chest wall.Complex cellulitis versus developing abscess. -Clinically and radiographically this seems to be cellulitis and induration and there is no drainable abscess -Drains removed 5/28 to eliminate foreign body participation in source control -She is not septic or toxic but symptomatically not improved. -With recurrence of high fever and continued evidence of cellulitis and induration, I think we have to assume that we  do not have source control.  This may be some type of localized synergistic infection that needs debridement. -Plan wound exploration in OR under general anesthesia today.  I plan to open up the mastectomy incisions inframammary and probably vertical and opened the skin flap to completely expose this area we will be packed open -I discussed the indications, details, techniques, and risk of the surgery with the patient in detail.  She has excellent insight and understanding and agrees with this plan. -Continue vancomycin and maxipime.  Adjust pending cultures.  ID following   Right arm neuropathy and neuritis.Suspect this is related to her right axillary sentinel node biopsy. Continue gabapentin.  She says this pain is less than the right chest wall pain. -She did have D5W injection therapy by Dr. Nelva Bush 1 week prior to the onset of this infection in the right upper arm.  Not sure whether this has any relationship  to the infection  Right breast cancer, triple negative -She had a complete pathologic response from her neoadjuvant chemotherapy Status post neoadjuvant chemotherapy -Followed by Dr.Gudena  Constipation.  Add MiraLAX and Senokot Hypokalemia.  IV adjusted.  Labs pending  Macromastia   LOS: 2 days    Adin Hector 11/28/2018

## 2018-11-28 NOTE — Progress Notes (Signed)
Pt awake, alert and oriented x 4 no change in neuro status DR  Dalbert Batman in this am to see and explained procedure to pt.  surge pt pt scheduled for 0900 Lovenox  not given per MD . Notify MD that pt was negative for MRSA PCR and positive for staph Aures . No treatment needed per pharmacy . Pt medicated for pain to rt axilla post rt Mastectomy  with swelling.  Pt  applied cold packs alternates with warm pack  with little relief noticed..   No apparent injury from last night fall.  Remained NPO concernt on pt chart.   Pre-opp and fall education and safety re enforced . Call bell within pt reach. Verbalized some relief of pain and nausea. RN will continue to monitor.

## 2018-11-28 NOTE — Transfer of Care (Signed)
Immediate Anesthesia Transfer of Care Note  Patient: Holly Wiley  Procedure(s) Performed: Exploration and Drainage of Right Mastectomy wound. (Right )  Patient Location: PACU  Anesthesia Type:General  Level of Consciousness: awake and oriented  Airway & Oxygen Therapy: Patient Spontanous Breathing  Post-op Assessment: Report given to RN and Post -op Vital signs reviewed and stable  Post vital signs: Reviewed and stable  Last Vitals:  Vitals Value Taken Time  BP 121/59 11/28/2018 10:08 AM  Temp    Pulse 92 11/28/2018 10:08 AM  Resp 10 11/28/2018 10:08 AM  SpO2 97 % 11/28/2018 10:08 AM  Vitals shown include unvalidated device data.  Last Pain:  Vitals:   11/28/18 0821  TempSrc:   PainSc: 5       Patients Stated Pain Goal: 3 (10/93/23 5573)  Complications: No apparent anesthesia complications

## 2018-11-28 NOTE — Progress Notes (Signed)
INFECTIOUS DISEASE PROGRESS NOTE  ID: Holly Wiley is a 43 y.o. female with  Principal Problem:   Postoperative wound infection Active Problems:   Malignant neoplasm of upper-outer quadrant of right breast in female, estrogen receptor negative ([place])   Port-A-Cath in place   Nausea and vomiting   Breast cancer ([place])   Wound infection after surgery  Subjective: Continued pain (seen in PACU).  Had some wound leakage  Abtx:  Anti-infectives (From admission, onward)   Start     Dose/Rate Route Frequency Ordered Stop   11/26/18 1400  [MAR Hold]  ceFEPIme (MAXIPIME) 2 g in sodium chloride 0.9 % 100 mL IVPB     (MAR Hold since Sat 11/28/2018 at 0851. Reason: Transfer to a Procedural area.)   2 g 200 mL/hr over 30 Minutes Intravenous Every 8 hours 11/26/18 1328     11/26/18 1000  [MAR Hold]  vancomycin (VANCOCIN) 1,250 mg in sodium chloride 0.9 % 250 mL IVPB     (MAR Hold since Sat 11/28/2018 at 0851. Reason: Transfer to a Procedural area.)   1,250 mg 166.7 mL/hr over 90 Minutes Intravenous Every 12 hours 11/26/18 0913     11/26/18 0230  vancomycin (VANCOCIN) IVPB 1000 mg/200 mL premix  Status:  Discontinued     1,000 mg 200 mL/hr over 60 Minutes Intravenous  Once 11/26/18 0223 11/26/18 0910   11/26/18 0100  piperacillin-tazobactam (ZOSYN) IVPB 3.375 g  Status:  Discontinued     3.375 g 12.5 mL/hr over 240 Minutes Intravenous Every 8 hours 11/26/18 0059 11/26/18 1326   11/25/18 2230  vancomycin (VANCOCIN) 1,750 mg in sodium chloride 0.9 % 500 mL IVPB     1,750 mg 250 mL/hr over 120 Minutes Intravenous  Once 11/25/18 2126 11/26/18 0250   11/25/18 2130  ceFEPIme (MAXIPIME) 2 g in sodium chloride 0.9 % 100 mL IVPB     2 g 200 mL/hr over 30 Minutes Intravenous  Once 11/25/18 2121 11/25/18 2243   11/25/18 2130  metroNIDAZOLE (FLAGYL) IVPB 500 mg     500 mg 100 mL/hr over 60 Minutes Intravenous  Once 11/25/18 2121 11/26/18 0038   11/25/18 2130  vancomycin (VANCOCIN) IVPB 1000 mg/200 mL  premix  Status:  Discontinued     1,000 mg 200 mL/hr over 60 Minutes Intravenous  Once 11/25/18 2121 11/25/18 2126      Medications:  Scheduled: . [MAR Hold] diphenhydrAMINE  25 mg Intravenous Once  . [MAR Hold] enoxaparin (LOVENOX) injection  40 mg Subcutaneous Q24H  . [MAR Hold] gabapentin  900 mg Oral QID  . HYDROmorphone      . HYDROmorphone      . oxyCODONE      . [MAR Hold] senna-docusate  1 tablet Oral BID  . [MAR Hold] sodium chloride flush  10-40 mL Intracatheter Q12H    Objective: Vital signs in last 24 hours: Temp:  [98.2 F (36.8 C)-102.6 F (39.2 C)] 98.2 F (36.8 C) (05/30 1010) Pulse Rate:  [82-107] 88 (05/30 1300) Resp:  [10-18] 13 (05/30 1225) BP: (103-151)/(49-88) 151/78 (05/30 1300) SpO2:  [91 %-98 %] 94 % (05/30 1300)   General appearance: alert, cooperative and no distress Chest wall: wound dressed, binder on.   Lab Results Recent Labs    11/27/18 0450 11/28/18 0550  WBC 8.5 6.8  HGB 9.9* 10.2*  HCT 29.8* 31.6*  NA 135 138  K 3.3* 4.0  CL 103 100  CO2 24 26  BUN 6 <5*  CREATININE 0.67 0.66  Liver Panel Recent Labs    11/25/18 2156  PROT 6.9  ALBUMIN 3.8  AST 22  ALT 25  ALKPHOS 67  BILITOT 0.4   Sedimentation Rate No results for input(s): ESRSEDRATE in the last 72 hours. C-Reactive Protein No results for input(s): CRP in the last 72 hours.  Microbiology: Recent Results (from the past 240 hour(s))  SARS Coronavirus 2 (CEPHEID - Performed in [facility] hospital lab), Hosp Order     Status: None   Collection Time: 11/25/18  9:22 PM  Result Value Ref Range Status   SARS Coronavirus 2 NEGATIVE NEGATIVE Final    Comment: (NOTE) If result is NEGATIVE SARS-CoV-2 target nucleic acids are NOT DETECTED. The SARS-CoV-2 RNA is generally detectable in upper and lower  respiratory specimens during the acute phase of infection. The lowest  concentration of SARS-CoV-2 viral copies this assay can detect is 250  copies / mL. A negative  result does not preclude SARS-CoV-2 infection  and should not be used as the sole basis for treatment or other  patient management decisions.  A negative result may occur with  improper specimen collection / handling, submission of specimen other  than nasopharyngeal swab, presence of viral mutation(s) within the  areas targeted by this assay, and inadequate number of viral copies  (<250 copies / mL). A negative result must be combined with clinical  observations, patient history, and epidemiological information. If result is POSITIVE SARS-CoV-2 target nucleic acids are DETECTED. The SARS-CoV-2 RNA is generally detectable in upper and lower  respiratory specimens dur ing the acute phase of infection.  Positive  results are indicative of active infection with SARS-CoV-2.  Clinical  correlation with patient history and other diagnostic information is  necessary to determine patient infection status.  Positive results do  not rule out bacterial infection or co-infection with other viruses. If result is PRESUMPTIVE POSTIVE SARS-CoV-2 nucleic acids MAY BE PRESENT.   A presumptive positive result was obtained on the submitted specimen  and confirmed on repeat testing.  While 2019 novel coronavirus  (SARS-CoV-2) nucleic acids may be present in the submitted sample  additional confirmatory testing may be necessary for epidemiological  and / or clinical management purposes  to differentiate between  SARS-CoV-2 and other Sarbecovirus currently known to infect humans.  If clinically indicated additional testing with an alternate test  methodology [phone]) is advised. The SARS-CoV-2 RNA is generally  detectable in upper and lower respiratory sp ecimens during the acute  phase of infection. The expected result is Negative. Fact Sheet for Patients:  StrictlyIdeas.no Fact Sheet for Healthcare Providers: BankingDealers.co.za This test is not yet  approved or cleared by the [place] FDA and has been authorized for detection and/or diagnosis of SARS-CoV-2 by FDA under an Emergency Use Authorization (EUA).  This EUA will remain in effect (meaning this test can be used) for the duration of the COVID-19 declaration under Section 564(b)(1) of the Act, 21 U.S.C. section 360bbb-3(b)(1), unless the authorization is terminated or revoked sooner. Performed at [facility], [place] [address], [place], [place] [address]   Blood Culture (routine x 2)     Status: None (Preliminary result)   Collection Time: 11/25/18  9:34 PM  Result Value Ref Range Status   Specimen Description BLOOD LEFT HAND  Final   Special Requests   Final    BOTTLES DRAWN AEROBIC AND ANAEROBIC Blood Culture results may not be optimal due to an excessive volume of blood received in culture bottles  Culture   Final    NO GROWTH 2 DAYS Performed at [facility], [place] [address] [place], [place] [address]    Report Status PENDING  Incomplete  Blood Culture (routine x 2)     Status: None (Preliminary result)   Collection Time: 11/25/18  9:54 PM  Result Value Ref Range Status   Specimen Description BLOOD LEFT HAND  Final   Special Requests   Final    BOTTLES DRAWN AEROBIC ONLY Blood Culture results may not be optimal due to an excessive volume of blood received in culture bottles   Culture   Final    NO GROWTH 2 DAYS Performed at [facility], [place] [address] [place], [place] [address]    Report Status PENDING  Incomplete  Aerobic/Anaerobic Culture (surgical/deep wound)     Status: None (Preliminary result)   Collection Time: 11/25/18 10:47 PM  Result Value Ref Range Status   Specimen Description WOUND  Final   Special Requests DRAIN IS MALODOROUS  Final   Gram Stain   Final    MODERATE WBC PRESENT, PREDOMINANTLY PMN ABUNDANT GRAM NEGATIVE RODS MODERATE GRAM POSITIVE COCCI FEW GRAM POSITIVE RODS Performed at [facility], [place] [address] [place], [place] [address]    Culture   Final    ABUNDANT STAPHYLOCOCCUS AUREUS ABUNDANT STENOTROPHOMONAS MALTOPHILIA NO ANAEROBES ISOLATED; CULTURE IN PROGRESS FOR 5 DAYS    Report Status PENDING  Incomplete   Organism ID, Bacteria STAPHYLOCOCCUS AUREUS  Final   Organism ID, Bacteria STENOTROPHOMONAS MALTOPHILIA  Final      Susceptibility   Staphylococcus aureus - MIC*    CIPROFLOXACIN <=0.5 SENSITIVE Sensitive     ERYTHROMYCIN >=8 RESISTANT Resistant     GENTAMICIN <=0.5 SENSITIVE Sensitive     OXACILLIN <=0.25 SENSITIVE Sensitive     TETRACYCLINE <=1 SENSITIVE Sensitive     VANCOMYCIN 1 SENSITIVE Sensitive     TRIMETH/SULFA <=10 SENSITIVE Sensitive     CLINDAMYCIN RESISTANT Resistant     RIFAMPIN <=0.5 SENSITIVE Sensitive     Inducible Clindamycin POSITIVE Resistant     * ABUNDANT STAPHYLOCOCCUS AUREUS   Stenotrophomonas maltophilia - MIC*    LEVOFLOXACIN 1 SENSITIVE Sensitive     TRIMETH/SULFA <=20 SENSITIVE Sensitive     * ABUNDANT STENOTROPHOMONAS MALTOPHILIA  Aerobic/Anaerobic Culture (surgical/deep wound)     Status: None (Preliminary result)   Collection Time: 11/26/18 12:04 PM  Result Value Ref Range Status   Specimen Description BREAST  Final   Special Requests RIGHT  Final   Gram Stain   Final    RARE WBC PRESENT, PREDOMINANTLY PMN NO ORGANISMS SEEN    Culture   Final    NO GROWTH 2 DAYS Performed at [facility], [address] [place], [place] [address]    Report Status PENDING  Incomplete  Surgical PCR screen     Status: Abnormal   Collection Time: 11/28/18  1:24 AM  Result Value Ref Range Status   MRSA, PCR NEGATIVE NEGATIVE Final   Staphylococcus aureus POSITIVE (A) NEGATIVE Final    Comment: (NOTE) The Xpert SA Assay (FDA approved for NASAL specimens in patients 90 years of age and older), is one component of a comprehensive surveillance program. It is not intended to diagnose infection nor to guide or monitor treatment. Performed at [facility], [place] [address], [place], [place] [address]     Studies/Results: No results found.   Assessment/Plan: R mastectomy with wound  infection Repeat debridement 5-30 Bulb Cx- steno, MSSA Op Cx 5-28, 5-30 pending .  Total days of antibiotics: 3 cefepime/vanco  Will continue her current anbx while we await sensi of steno I discussed with her surgeon and with pt that Cx from bulb may be contaminated, inaccurate.  Await her deep Op Cx.            Bobby Rumpf MD, FACP Infectious Diseases (pager) [phone] [email or web address] 11/28/2018, 1:18 PM  LOS: 2 days

## 2018-11-28 NOTE — Anesthesia Procedure Notes (Signed)
Procedure Name: LMA Insertion Date/Time: 11/28/2018 9:23 AM Performed by: Clearnce Sorrel, CRNA Pre-anesthesia Checklist: Patient identified, Emergency Drugs available, Suction available, Patient being monitored and Timeout performed Patient Re-evaluated:Patient Re-evaluated prior to induction Oxygen Delivery Method: Circle system utilized Preoxygenation: Pre-oxygenation with 100% oxygen Induction Type: IV induction LMA: LMA inserted LMA Size: 4.0 Number of attempts: 1 Placement Confirmation: positive ETCO2 and breath sounds checked- equal and bilateral Tube secured with: Tape Dental Injury: Teeth and Oropharynx as per pre-operative assessment

## 2018-11-28 NOTE — Anesthesia Preprocedure Evaluation (Signed)
Anesthesia Evaluation  Patient identified by MRN, date of birth, ID band Patient awake    Reviewed: Allergy & Precautions, NPO status , Patient's Chart, lab work & pertinent test results  History of Anesthesia Complications (+) PONV and history of anesthetic complications  Airway Mallampati: I  TM Distance: >3 FB Neck ROM: Full    Dental  (+) Teeth Intact, Dental Advisory Given   Pulmonary    breath sounds clear to auscultation       Cardiovascular negative cardio ROS   Rhythm:Regular Rate:Normal     Neuro/Psych  Headaches,    GI/Hepatic negative GI ROS, Neg liver ROS,   Endo/Other  negative endocrine ROS  Renal/GU negative Renal ROS     Musculoskeletal negative musculoskeletal ROS (+)   Abdominal Normal abdominal exam  (+) + obese,   Peds  Hematology negative hematology ROS (+)   Anesthesia Other Findings Breast Cancer  Reproductive/Obstetrics                             Anesthesia Physical  Anesthesia Plan  ASA: III  Anesthesia Plan: General   Post-op Pain Management:  Regional for Post-op pain   Induction: Intravenous  PONV Risk Score and Plan: 4 or greater and Ondansetron, Dexamethasone, Midazolam and Scopolamine patch - Pre-op  Airway Management Planned: LMA  Additional Equipment: None  Intra-op Plan:   Post-operative Plan: Extubation in OR  Informed Consent: I have reviewed the patients History and Physical, chart, labs and discussed the procedure including the risks, benefits and alternatives for the proposed anesthesia with the patient or authorized representative who has indicated his/her understanding and acceptance.     Dental advisory given  Plan Discussed with: CRNA  Anesthesia Plan Comments:         Anesthesia Quick Evaluation

## 2018-11-29 ENCOUNTER — Encounter (HOSPITAL_COMMUNITY)
Admission: EM | Disposition: A | Discharge status: Home Health | Payer: Self-pay | Source: Home / Self Care | Attending: General Surgery

## 2018-11-29 ENCOUNTER — Inpatient Hospital Stay (HOSPITAL_COMMUNITY): Payer: BLUE CROSS/BLUE SHIELD | Admitting: Anesthesiology

## 2018-11-29 ENCOUNTER — Inpatient Hospital Stay: Payer: Self-pay

## 2018-11-29 ENCOUNTER — Encounter (HOSPITAL_COMMUNITY): Payer: Self-pay | Admitting: General Surgery

## 2018-11-29 DIAGNOSIS — R6883 Chills (without fever): Secondary | ICD-10-CM

## 2018-11-29 HISTORY — PX: DRESSING CHANGE UNDER ANESTHESIA: SHX5237

## 2018-11-29 LAB — BASIC METABOLIC PANEL
Anion gap: 11 (ref 5–15)
BUN: 6 mg/dL (ref 6–20)
CO2: 24 mmol/L (ref 22–32)
Calcium: 8.3 mg/dL — ABNORMAL LOW (ref 8.9–10.3)
Chloride: 103 mmol/L (ref 98–111)
Creatinine, Ser: 0.62 mg/dL (ref 0.44–1.00)
GFR calc Af Amer: >60 mL/min (ref >60)
GFR calc non Af Amer: >60 mL/min (ref >60)
Glucose, Bld: 110 mg/dL — ABNORMAL HIGH (ref 70–99)
Potassium: 3.1 mmol/L — ABNORMAL LOW (ref 3.5–5.1)
Sodium: 138 mmol/L (ref 135–145)

## 2018-11-29 LAB — CBC
HCT: 27.8 % — ABNORMAL LOW (ref 36.0–46.0)
Hemoglobin: 8.9 g/dL — ABNORMAL LOW (ref 12.0–15.0)
MCH: 33 pg (ref 26.0–34.0)
MCHC: 32 g/dL (ref 30.0–36.0)
MCV: 103 fL — ABNORMAL HIGH (ref 80.0–100.0)
Platelets: 164 10*3/uL (ref 150–400)
RBC: 2.7 MIL/uL — ABNORMAL LOW (ref 3.87–5.11)
RDW: 11.5 % (ref 11.5–15.5)
WBC: 6.1 10*3/uL (ref 4.0–10.5)
nRBC: 0 % (ref 0.0–0.2)

## 2018-11-29 SURGERY — REPLACEMENT, DRESSING, WITH ANESTHESIA
Anesthesia: Monitor Anesthesia Care | Site: Chest | Laterality: Right

## 2018-11-29 MED ORDER — LACTATED RINGERS IV SOLN
INTRAVENOUS | Status: DC | PRN
  Administered 2018-11-29: 07:00:00 via INTRAVENOUS

## 2018-11-29 MED ORDER — PROPOFOL 10 MG/ML IV BOLUS
INTRAVENOUS | Status: AC
  Filled 2018-11-29: qty 20

## 2018-11-29 MED ORDER — PROPOFOL 10 MG/ML IV BOLUS
INTRAVENOUS | Status: DC | PRN
  Administered 2018-11-29: 50 mg via INTRAVENOUS

## 2018-11-29 MED ORDER — PROPOFOL 500 MG/50ML IV EMUL
INTRAVENOUS | Status: DC | PRN
  Administered 2018-11-29: 100 ug/kg/min via INTRAVENOUS

## 2018-11-29 MED ORDER — MIDAZOLAM HCL 2 MG/2ML IJ SOLN
INTRAMUSCULAR | Status: AC
  Filled 2018-11-29: qty 2

## 2018-11-29 MED ORDER — 0.9 % SODIUM CHLORIDE (POUR BTL) OPTIME
TOPICAL | Status: DC | PRN
  Administered 2018-11-29: 08:00:00 1000 mL

## 2018-11-29 MED ORDER — MIDAZOLAM HCL 5 MG/5ML IJ SOLN
INTRAMUSCULAR | Status: DC | PRN
  Administered 2018-11-29: 2 mg via INTRAVENOUS

## 2018-11-29 MED ORDER — ENOXAPARIN SODIUM 40 MG/0.4ML ~~LOC~~ SOLN
40.0000 mg | SUBCUTANEOUS | Status: DC
  Administered 2018-11-30 – 2018-12-02 (×3): 40 mg via SUBCUTANEOUS
  Filled 2018-11-29 (×3): qty 0.4

## 2018-11-29 MED ORDER — ONDANSETRON HCL 4 MG/2ML IJ SOLN
INTRAMUSCULAR | Status: DC | PRN
  Administered 2018-11-29: 4 mg via INTRAVENOUS

## 2018-11-29 MED ORDER — LIDOCAINE 2% (20 MG/ML) 5 ML SYRINGE
INTRAMUSCULAR | Status: DC | PRN
  Administered 2018-11-29: 40 mg via INTRAVENOUS

## 2018-11-29 MED ORDER — CHLORHEXIDINE GLUCONATE CLOTH 2 % EX PADS: 6.0000 | MEDICATED_PAD | Freq: Once | CUTANEOUS | Status: DC

## 2018-11-29 MED ORDER — HYDROMORPHONE HCL 1 MG/ML IJ SOLN: 0.2500 mg | INTRAMUSCULAR | Status: DC | PRN

## 2018-11-29 MED ORDER — FENTANYL CITRATE (PF) 100 MCG/2ML IJ SOLN
INTRAMUSCULAR | Status: DC | PRN
  Administered 2018-11-29 (×2): 50 ug via INTRAVENOUS
  Administered 2018-11-29: 100 ug via INTRAVENOUS
  Administered 2018-11-29: 50 ug via INTRAVENOUS

## 2018-11-29 MED ORDER — DIPHENHYDRAMINE HCL 50 MG/ML IJ SOLN
INTRAMUSCULAR | Status: DC | PRN
  Administered 2018-11-29: 12.5 mg via INTRAVENOUS

## 2018-11-29 MED ORDER — PROMETHAZINE HCL 25 MG/ML IJ SOLN: 6.2500 mg | INTRAMUSCULAR | Status: DC | PRN

## 2018-11-29 MED ORDER — OXYCODONE HCL 5 MG PO TABS: 5.0000 mg | ORAL_TABLET | Freq: Once | ORAL | Status: DC | PRN

## 2018-11-29 MED ORDER — FENTANYL CITRATE (PF) 250 MCG/5ML IJ SOLN
INTRAMUSCULAR | Status: AC
  Filled 2018-11-29: qty 5

## 2018-11-29 MED ORDER — OXYCODONE HCL 5 MG/5ML PO SOLN: 5.0000 mg | Freq: Once | ORAL | Status: DC | PRN

## 2018-11-29 SURGICAL SUPPLY — 15 items
BINDER BREAST XLRG (GAUZE/BANDAGES/DRESSINGS) ×2 IMPLANT
BNDG GAUZE ELAST 4 BULKY (GAUZE/BANDAGES/DRESSINGS) ×2 IMPLANT
COVER SURGICAL LIGHT HANDLE (MISCELLANEOUS) ×3 IMPLANT
DRAPE LAPAROSCOPIC ABDOMINAL (DRAPES) ×5 IMPLANT
GAUZE SPONGE 4X4 12PLY STRL (GAUZE/BANDAGES/DRESSINGS) ×6 IMPLANT
GLOVE EUDERMIC 7 POWDERFREE (GLOVE) ×3 IMPLANT
GOWN STRL REUS W/ TWL XL LVL3 (GOWN DISPOSABLE) ×1 IMPLANT
GOWN STRL REUS W/TWL XL LVL3 (GOWN DISPOSABLE) ×6
KIT BASIN OR (CUSTOM PROCEDURE TRAY) ×5 IMPLANT
NS IRRIG 1000ML POUR BTL (IV SOLUTION) ×3 IMPLANT
PACK GENERAL/GYN (CUSTOM PROCEDURE TRAY) ×3 IMPLANT
PAD ABD 8X10 STRL (GAUZE/BANDAGES/DRESSINGS) ×6 IMPLANT
PAD ARMBOARD 7.5X6 YLW CONV (MISCELLANEOUS) ×4 IMPLANT
TAPE CLOTH SURG 6X10 WHT LF (GAUZE/BANDAGES/DRESSINGS) ×2 IMPLANT
TOWEL OR 17X26 10 PK STRL BLUE (TOWEL DISPOSABLE) ×3 IMPLANT

## 2018-11-29 NOTE — Interval H&P Note (Signed)
History and Physical Interval Note:  11/29/2018 5:01 AM  Marily Memos  has presented today for surgery, with the diagnosis of right mastectomy wound.  The various methods of treatment have been discussed with the patient and family. After consideration of risks, benefits and other options for treatment, the patient has consented to  Procedure(s): DRESSING CHANGE UNDER ANESTHESIA (Right) as a surgical intervention.  The patient's history has been reviewed, patient examined, no change in status, stable for surgery.  I have reviewed the patient's chart and labs.  Questions were answered to the patient's satisfaction.     Adin Hector

## 2018-11-29 NOTE — Progress Notes (Signed)
Lab reported to nurs that tissue culture gram stain showed nothing but the culture gram stain is growing a rare staphococcy.  MD for [facility] notified via test through [place] [place].  Will continue to monitor.

## 2018-11-29 NOTE — Anesthesia Preprocedure Evaluation (Addendum)
Anesthesia Evaluation  Patient identified by MRN, date of birth, ID band Patient awake    Reviewed: Allergy & Precautions, NPO status , Patient's Chart, lab work & pertinent test results  History of Anesthesia Complications (+) PONV and history of anesthetic complications  Airway Mallampati: I  TM Distance: >3 FB Neck ROM: Full    Dental no notable dental hx.    Pulmonary neg pulmonary ROS,    Pulmonary exam normal breath sounds clear to auscultation       Cardiovascular negative cardio ROS Normal cardiovascular exam Rhythm:Regular Rate:Normal  ECG: ST, rate 104   Neuro/Psych  Headaches, Seizures -, Well Controlled,  head injury/post-concussive syndrome negative psych ROS   GI/Hepatic negative GI ROS, Neg liver ROS,   Endo/Other  negative endocrine ROS  Renal/GU negative Renal ROS     Musculoskeletal negative musculoskeletal ROS (+)   Abdominal (+) + obese,   Peds  Hematology  (+) anemia ,   Anesthesia Other Findings Right mastectomy wound  Reproductive/Obstetrics                            Anesthesia Physical Anesthesia Plan  ASA: II  Anesthesia Plan: MAC   Post-op Pain Management:    Induction: Intravenous  PONV Risk Score and Plan: 3 and Midazolam, Dexamethasone, Ondansetron, Treatment may vary due to age or medical condition and Propofol infusion  Airway Management Planned: Simple Face Mask  Additional Equipment:   Intra-op Plan:   Post-operative Plan:   Informed Consent: I have reviewed the patients History and Physical, chart, labs and discussed the procedure including the risks, benefits and alternatives for the proposed anesthesia with the patient or authorized representative who has indicated his/her understanding and acceptance.     Dental advisory given  Plan Discussed with: CRNA  Anesthesia Plan Comments:         Anesthesia Quick Evaluation

## 2018-11-29 NOTE — Op Note (Signed)
Patient Name:           Holly Wiley   Date of Surgery:        11/29/2018  Pre op Diagnosis:      Infection right mastectomy wound  Post op Diagnosis:    Infection right mastectomy wound  Procedure:                 Right mastectomy wound exploration, dressing change  Surgeon:                     Edsel Petrin. Dalbert Batman, M.D., FACS  Assistant:                      OR staff  Operative Indications:   This is a 43 year old female 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  She initially underwent Port-A-Cath insertion and neoadjuvant chemotherapy.  Fortunately she had complete pathologic response.  On Nov 06, 2018 she underwent bilateral mastectomy with skin reduction pattern and right axillary sentinel node biopsy.  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  She required some evaluation by Dr. Suella Broad, physiatrist at [facility].  About 9 days ago she underwent some injection therapy of the right upper inner arm with D5W..  She presented to the hospital on May 27 with pain fever and swelling of the lateral aspect of the right mastectomy wound.  This looked like a cellulitis.  She was admitted and placed on broad-spectrum antibiotics.  Infectious disease was consulted.  Imaging studies did not show a well-defined abscess.  Needle aspiration in radiology revealed only 10 cc of bloody fluid.  We initially treated this as if it was a cellulitis but she began spiking fevers again on 5/29 to 102 and she continued to have significant pain and induration and tenderness in the right lateral aspect of the wound.  We brought her to the operating room yesterday and opened the wound, finding purulent fluid in the space between the pectoralis muscle and subcutaneous tissue but without fasciitis, without odor, and without other necrosis.  The wound was packed we brought her back to the operating room today for her first dressing change under  anesthesia  Operative Findings:       Dressing change went well.  The wound was completely clean.  There was no odor.  There was no purulence.  There was no necrosis.  Procedure in Detail:          The patient was brought to the operating room.  She was monitored and sedated by the anesthesia department.  The Kerlix packing was removed.  The right chest wall was prepped and draped in a sterile fashion.  Surgical timeout was performed.  We explored all of the spaces, anterior to the pectoralis major, down toward the axilla.  These areas were washed out and were hemostatic.  It was packed loosely with saline moistened Kerlix then covered with 4 x 4's ABDs and a elastic binder.  She tolerated the procedure well and was taken to PACU in stable condition.  There was essentially no blood loss.     Edsel Petrin. Dalbert Batman, M.D., FACS General and Minimally Invasive Surgery Breast and Colorectal Surgery  11/29/2018 8:21 AM

## 2018-11-29 NOTE — Anesthesia Procedure Notes (Signed)
Procedure Name: MAC Date/Time: 11/29/2018 7:45 AM Performed by: Renato Shin, CRNA Pre-anesthesia Checklist: Patient identified, Emergency Drugs available, Suction available and Patient being monitored Patient Re-evaluated:Patient Re-evaluated prior to induction Oxygen Delivery Method: Nasal cannula Preoxygenation: Pre-oxygenation with 100% oxygen Induction Type: IV induction Placement Confirmation: positive ETCO2 and breath sounds checked- equal and bilateral Dental Injury: Teeth and Oropharynx as per pre-operative assessment

## 2018-11-29 NOTE — Progress Notes (Signed)
Pharmacy Antibiotic Note  Holly Wiley is a 43 y.o. female admitted on 11/25/2018 with  chest wall abscess s/p mastectomy .  Plan: D4 abx Vanc 1750mg  x1, then 1250 q12h, SCr stable   Per ID the 5/27 DrainCx may be contaminated (Staph,Steno), Await her deep OperativeCx from 5/30, Cefepime d/c  Height: 5\' 7"  (170.2 cm) Weight: 197 lb 12.8 oz (89.7 kg) IBW/kg (Calculated) : 61.6  Temp (24hrs), Avg:98.2 F (36.8 C), Min:97.2 F (36.2 C), Max:98.7 F (37.1 C)  Recent Labs  Lab 11/25/18 2156 11/26/18 0357 11/27/18 0450 11/28/18 0550 11/29/18 0435  WBC 8.4 9.8 8.5 6.8 6.1  CREATININE 0.74  --  0.67 0.66 0.62  LATICACIDVEN 1.0  --   --   --   --     Estimated Creatinine Clearance: 105.3 mL/min (by C-G formula based on SCr of 0.62 mg/dL).    Cefepime 5/27>> 5/30 Flagyl 5/27 x 1 Zosyn 5/28x1 Vanc 5/28>>  5/30 OperativeCx: rare Staph aureus - await sens 5/30 Surg PCR: neg/pos 5/28 Breast AbscessCx: no organism seen gram stain 5/27 Wound drainage: Staph aureus - MSSA                      Stenotrophamonas- LVQ,Septra sens 5/27 covid neg 5/27 BCx: ngtd  Minda Ditto PharmD [phone] Clinical Pharmacist  Please check AMION for all [place] numbers  11/29/2018 3:13 PM

## 2018-11-29 NOTE — Anesthesia Postprocedure Evaluation (Signed)
Anesthesia Post Note  Patient: Holly Wiley  Procedure(s) Performed: DRESSING CHANGE UNDER ANESTHESIA (Right Chest)     Patient location during evaluation: PACU Anesthesia Type: MAC Level of consciousness: awake and alert Pain management: pain level controlled Vital Signs Assessment: post-procedure vital signs reviewed and stable Respiratory status: spontaneous breathing, nonlabored ventilation, respiratory function stable and patient connected to nasal cannula oxygen Cardiovascular status: stable and blood pressure returned to baseline Postop Assessment: no apparent nausea or vomiting Anesthetic complications: no    Last Vitals:  Vitals:   11/29/18 0905 11/29/18 1247  BP: 111/69 105/69  Pulse: 74 82  Resp: 12 18  Temp:  37.1 C  SpO2: 100% 99%    Last Pain:  Vitals:   11/29/18 1458  TempSrc:   PainSc: 10-Worst pain ever                 Ryan P Ellender

## 2018-11-29 NOTE — Transfer of Care (Signed)
Immediate Anesthesia Transfer of Care Note  Patient: Holly Wiley  Procedure(s) Performed: DRESSING CHANGE UNDER ANESTHESIA (Right Chest)  Patient Location: PACU  Anesthesia Type:MAC  Level of Consciousness: awake, drowsy and patient cooperative  Airway & Oxygen Therapy: Patient Spontanous Breathing and Patient connected to nasal cannula oxygen  Post-op Assessment: Report given to RN and Post -op Vital signs reviewed and stable  Post vital signs: Reviewed and stable  Last Vitals:  Vitals Value Taken Time  BP 94/44 11/29/2018  8:32 AM  Temp    Pulse 76 11/29/2018  8:32 AM  Resp 11 11/29/2018  8:32 AM  SpO2 97 % 11/29/2018  8:32 AM  Vitals shown include unvalidated device data.  Last Pain:  Vitals:   11/29/18 0520  TempSrc:   PainSc: 7       Patients Stated Pain Goal: 3 (76/22/63 3354)  Complications: No apparent anesthesia complications

## 2018-11-29 NOTE — Progress Notes (Signed)
INFECTIOUS DISEASE PROGRESS NOTE  ID: Holly Wiley is a 43 y.o. female with  Principal Problem:   Postoperative wound infection Active Problems:   Malignant neoplasm of upper-outer quadrant of right breast in female, estrogen receptor negative ([place])   Port-A-Cath in place   Nausea and vomiting   Breast cancer ([place])   Wound infection after surgery  Subjective: continued complaints of pain.  Also episodes of chills but no fever.   Abtx:  Anti-infectives (From admission, onward)   Start     Dose/Rate Route Frequency Ordered Stop   11/26/18 1400  ceFEPIme (MAXIPIME) 2 g in sodium chloride 0.9 % 100 mL IVPB     2 g 200 mL/hr over 30 Minutes Intravenous Every 8 hours 11/26/18 1328     11/26/18 1000  vancomycin (VANCOCIN) 1,250 mg in sodium chloride 0.9 % 250 mL IVPB     1,250 mg 166.7 mL/hr over 90 Minutes Intravenous Every 12 hours 11/26/18 0913     11/26/18 0230  vancomycin (VANCOCIN) IVPB 1000 mg/200 mL premix  Status:  Discontinued     1,000 mg 200 mL/hr over 60 Minutes Intravenous  Once 11/26/18 0223 11/26/18 0910   11/26/18 0100  piperacillin-tazobactam (ZOSYN) IVPB 3.375 g  Status:  Discontinued     3.375 g 12.5 mL/hr over 240 Minutes Intravenous Every 8 hours 11/26/18 0059 11/26/18 1326   11/25/18 2230  vancomycin (VANCOCIN) 1,750 mg in sodium chloride 0.9 % 500 mL IVPB     1,750 mg 250 mL/hr over 120 Minutes Intravenous  Once 11/25/18 2126 11/26/18 0250   11/25/18 2130  ceFEPIme (MAXIPIME) 2 g in sodium chloride 0.9 % 100 mL IVPB     2 g 200 mL/hr over 30 Minutes Intravenous  Once 11/25/18 2121 11/25/18 2243   11/25/18 2130  metroNIDAZOLE (FLAGYL) IVPB 500 mg     500 mg 100 mL/hr over 60 Minutes Intravenous  Once 11/25/18 2121 11/26/18 0038   11/25/18 2130  vancomycin (VANCOCIN) IVPB 1000 mg/200 mL premix  Status:  Discontinued     1,000 mg 200 mL/hr over 60 Minutes Intravenous  Once 11/25/18 2121 11/25/18 2126      Medications:  Scheduled: . diphenhydrAMINE   25 mg Intravenous Once  . [START ON 11/30/2018] enoxaparin (LOVENOX) injection  40 mg Subcutaneous Q24H  . gabapentin  900 mg Oral QID  . senna-docusate  1 tablet Oral BID  . sodium chloride flush  10-40 mL Intracatheter Q12H    Objective: Vital signs in last 24 hours: Temp:  [97.2 F (36.2 C)-98.7 F (37.1 C)] 98.7 F (37.1 C) (05/31 1247) Pulse Rate:  [74-101] 82 (05/31 1247) Resp:  [12-18] 18 (05/31 1247) BP: (97-135)/(58-91) 105/69 (05/31 1247) SpO2:  [95 %-100 %] 99 % (05/31 1247)   General appearance: alert, cooperative and no distress Chest wall: wrapped in binder  Lab Results Recent Labs    11/28/18 0550 11/29/18 0435  WBC 6.8 6.1  HGB 10.2* 8.9*  HCT 31.6* 27.8*  NA 138 138  K 4.0 3.1*  CL 100 103  CO2 26 24  BUN <5* 6  CREATININE 0.66 0.62   Liver Panel No results for input(s): PROT, ALBUMIN, AST, ALT, ALKPHOS, BILITOT, BILIDIR, IBILI in the last 72 hours. Sedimentation Rate No results for input(s): ESRSEDRATE in the last 72 hours. C-Reactive Protein No results for input(s): CRP in the last 72 hours.  Microbiology: Recent Results (from the past 240 hour(s))  SARS Coronavirus 2 (CEPHEID - Performed in [facility] hospital lab), Hosp Order     Status: None   Collection Time: 11/25/18  9:22 PM  Result Value Ref Range Status   SARS Coronavirus 2 NEGATIVE NEGATIVE Final    Comment: (NOTE) If result is NEGATIVE SARS-CoV-2 target nucleic acids are NOT DETECTED. The SARS-CoV-2 RNA is generally detectable in upper and lower  respiratory specimens during the acute phase of infection. The lowest  concentration of SARS-CoV-2 viral copies this assay can detect is 250  copies / mL. A negative result does not preclude SARS-CoV-2 infection  and should not be used as the sole basis for treatment or other  patient management decisions.  A negative result may occur with  improper specimen collection / handling, submission of specimen other  than nasopharyngeal swab,  presence of viral mutation(s) within the  areas targeted by this assay, and inadequate number of viral copies  (<250 copies / mL). A negative result must be combined with clinical  observations, patient history, and epidemiological information. If result is POSITIVE SARS-CoV-2 target nucleic acids are DETECTED. The SARS-CoV-2 RNA is generally detectable in upper and lower  respiratory specimens dur ing the acute phase of infection.  Positive  results are indicative of active infection with SARS-CoV-2.  Clinical  correlation with patient history and other diagnostic information is  necessary to determine patient infection status.  Positive results do  not rule out bacterial infection or co-infection with other viruses. If result is PRESUMPTIVE POSTIVE SARS-CoV-2 nucleic acids MAY BE PRESENT.   A presumptive positive result was obtained on the submitted specimen  and confirmed on repeat testing.  While 2019 novel coronavirus  (SARS-CoV-2) nucleic acids may be present in the submitted sample  additional confirmatory testing may be necessary for epidemiological  and / or clinical management purposes  to differentiate between  SARS-CoV-2 and other Sarbecovirus currently known to infect humans.  If clinically indicated additional testing with an alternate test  methodology [phone]) is advised. The SARS-CoV-2 RNA is generally  detectable in upper and lower respiratory sp ecimens during the acute  phase of infection. The expected result is Negative. Fact Sheet for Patients:  StrictlyIdeas.no Fact Sheet for Healthcare Providers: BankingDealers.co.za This test is not yet approved or cleared by the [place] FDA and has been authorized for detection and/or diagnosis of SARS-CoV-2 by FDA under an Emergency Use Authorization (EUA).  This EUA will remain in effect (meaning this test can be used) for the duration of the COVID-19 declaration under  Section 564(b)(1) of the Act, 21 U.S.C. section 360bbb-3(b)(1), unless the authorization is terminated or revoked sooner. Performed at [facility], [place] [address] [place], [place] [address]   Blood Culture (routine x 2)     Status: None (Preliminary result)   Collection Time: 11/25/18  9:34 PM  Result Value Ref Range Status   Specimen Description BLOOD LEFT HAND  Final   Special Requests   Final    BOTTLES DRAWN AEROBIC AND ANAEROBIC Blood Culture results may not be optimal due to an excessive volume of blood received in culture bottles   Culture   Final    NO GROWTH 3 DAYS Performed at [facility], [place] [address], [place], [place] [address]    Report Status PENDING  Incomplete  Blood Culture (routine x 2)     Status: None (Preliminary result)   Collection Time: 11/25/18  9:54 PM  Result Value Ref Range Status   Specimen Description BLOOD LEFT HAND  Final  Special Requests   Final    BOTTLES DRAWN AEROBIC ONLY Blood Culture results may not be optimal due to an excessive volume of blood received in culture bottles   Culture   Final    NO GROWTH 3 DAYS Performed at [facility], [place] [address] [place], [place] [address]    Report Status PENDING  Incomplete  Aerobic/Anaerobic Culture (surgical/deep wound)     Status: None (Preliminary result)   Collection Time: 11/25/18 10:47 PM  Result Value Ref Range Status   Specimen Description WOUND  Final   Special Requests DRAIN IS MALODOROUS  Final   Gram Stain   Final    MODERATE WBC PRESENT, PREDOMINANTLY PMN ABUNDANT GRAM NEGATIVE RODS MODERATE GRAM POSITIVE COCCI FEW GRAM POSITIVE RODS Performed at [facility], [place] [address] [place], [place] [address]    Culture   Final    ABUNDANT STAPHYLOCOCCUS AUREUS ABUNDANT STENOTROPHOMONAS MALTOPHILIA NO ANAEROBES ISOLATED; CULTURE IN PROGRESS FOR 5 DAYS    Report Status PENDING  Incomplete   Organism ID, Bacteria STAPHYLOCOCCUS AUREUS  Final   Organism ID,  Bacteria STENOTROPHOMONAS MALTOPHILIA  Final      Susceptibility   Staphylococcus aureus - MIC*    CIPROFLOXACIN <=0.5 SENSITIVE Sensitive     ERYTHROMYCIN >=8 RESISTANT Resistant     GENTAMICIN <=0.5 SENSITIVE Sensitive     OXACILLIN <=0.25 SENSITIVE Sensitive     TETRACYCLINE <=1 SENSITIVE Sensitive     VANCOMYCIN 1 SENSITIVE Sensitive     TRIMETH/SULFA <=10 SENSITIVE Sensitive     CLINDAMYCIN RESISTANT Resistant     RIFAMPIN <=0.5 SENSITIVE Sensitive     Inducible Clindamycin POSITIVE Resistant     * ABUNDANT STAPHYLOCOCCUS AUREUS   Stenotrophomonas maltophilia - MIC*    LEVOFLOXACIN 1 SENSITIVE Sensitive     TRIMETH/SULFA <=20 SENSITIVE Sensitive     * ABUNDANT STENOTROPHOMONAS MALTOPHILIA  Aerobic/Anaerobic Culture (surgical/deep wound)     Status: None (Preliminary result)   Collection Time: 11/26/18 12:04 PM  Result Value Ref Range Status   Specimen Description BREAST  Final   Special Requests RIGHT  Final   Gram Stain   Final    RARE WBC PRESENT, PREDOMINANTLY PMN NO ORGANISMS SEEN    Culture   Final    NO GROWTH 3 DAYS NO ANAEROBES ISOLATED; CULTURE IN PROGRESS FOR 5 DAYS Performed at [facility], [address], [place], [place] [address]    Report Status PENDING  Incomplete  Surgical PCR screen     Status: Abnormal   Collection Time: 11/28/18  1:24 AM  Result Value Ref Range Status   MRSA, PCR NEGATIVE NEGATIVE Final   Staphylococcus aureus POSITIVE (A) NEGATIVE Final    Comment: (NOTE) The Xpert SA Assay (FDA approved for NASAL specimens in patients 43 years of age and older), is one component of a comprehensive surveillance program. It is not intended to diagnose infection nor to guide or monitor treatment. Performed at [facility], [place] [address] [place], [place] [address]   Aerobic/Anaerobic Culture (surgical/deep wound)     Status: None (Preliminary result)   Collection Time: 11/28/18  9:40 AM  Result Value Ref Range Status   Specimen  Description WOUND RIGHT MASTECTOMY SPEC A  Final   Special Requests PATIENT ON FOLLOWING CEFEPIME VANC  Final   Gram Stain   Final    NO WBC SEEN RARE GRAM POSITIVE COCCI IN PAIRS IN CLUSTERS Performed at [facility],  [address] [place], [place] [address]    Culture FEW STAPHYLOCOCCUS AUREUS  Final   Report Status PENDING  Incomplete  Aerobic/Anaerobic Culture (surgical/deep wound)     Status: None (Preliminary result)   Collection Time: 11/28/18  9:44 AM  Result Value Ref Range Status   Specimen Description TISSUE RIGHT MASTECTOMY SPEC B  Final   Special Requests PATIENT ON FOLLOWING CEFEPIME VANC  Final   Gram Stain   Final    RARE WBC PRESENT,BOTH PMN AND MONONUCLEAR NO ORGANISMS SEEN    Culture   Final    RARE STAPHYLOCOCCUS AUREUS CRITICAL RESULT CALLED TO, READ BACK BY AND VERIFIED WITH: A. GODDARD RN, AT 1009 11/29/18 BY D. VANHOOK  REGARDING CULTURE GROWTH CULTURE REINCUBATED FOR BETTER GROWTH Performed at [facility], [place] [address] [place], [place] [address]    Report Status PENDING  Incomplete    Studies/Results: [place] Ekg Site Rite  Result Date: 11/29/2018 If Site Rite image not attached, placement could not be confirmed due to current cardiac rhythm.    Assessment/Plan: R mastectomy with wound infection Repeat debridement 5-30 Bulb Cx- steno, MSSA Op Cx 5-28, 5-30 pending .  Total days of antibiotics: 3 cefepime/vanco        Will stop cefepime Await her Op Cx (growing Staph, sensi pending) Cr stable on vanco She is getting PIC today.    Bobby Rumpf MD, FACP Infectious Diseases (pager) [phone] [email or web address] 11/29/2018, 2:15 PM  LOS: 3 days

## 2018-11-29 NOTE — Plan of Care (Signed)

## 2018-11-30 ENCOUNTER — Encounter (HOSPITAL_COMMUNITY): Payer: Self-pay | Admitting: General Surgery

## 2018-11-30 DIAGNOSIS — N611 Abscess of the breast and nipple: Secondary | ICD-10-CM

## 2018-11-30 DIAGNOSIS — B9689 Other specified bacterial agents as the cause of diseases classified elsewhere: Secondary | ICD-10-CM

## 2018-11-30 DIAGNOSIS — Z95828 Presence of other vascular implants and grafts: Secondary | ICD-10-CM

## 2018-11-30 LAB — CULTURE, BLOOD (ROUTINE X 2)
Culture: NO GROWTH
Culture: NO GROWTH

## 2018-11-30 MED ORDER — LORAZEPAM 2 MG/ML IJ SOLN
0.5000 mg | Freq: Two times a day (BID) | INTRAMUSCULAR | Status: DC | PRN
  Administered 2018-12-01 (×2): 0.5 mg via INTRAVENOUS
  Filled 2018-11-30 (×2): qty 1

## 2018-11-30 MED ORDER — MUPIROCIN 2 % EX OINT
TOPICAL_OINTMENT | Freq: Two times a day (BID) | CUTANEOUS | Status: DC
  Administered 2018-11-30 – 2018-12-02 (×4): via NASAL
  Filled 2018-11-30: qty 22

## 2018-11-30 MED ORDER — MUPIROCIN 2 % EX OINT: TOPICAL_OINTMENT | Freq: Two times a day (BID) | CUTANEOUS | Status: DC

## 2018-11-30 MED ORDER — SULFAMETHOXAZOLE-TRIMETHOPRIM 800-160 MG PO TABS
1.0000 | ORAL_TABLET | Freq: Two times a day (BID) | ORAL | Status: DC
  Administered 2018-11-30 – 2018-12-02 (×5): 1 via ORAL
  Filled 2018-11-30 (×5): qty 1

## 2018-11-30 MED ORDER — KETOROLAC TROMETHAMINE 10 MG PO TABS
10.0000 mg | ORAL_TABLET | Freq: Three times a day (TID) | ORAL | Status: AC
  Administered 2018-11-30 – 2018-12-01 (×6): 10 mg via ORAL
  Filled 2018-11-30 (×6): qty 1

## 2018-11-30 MED ORDER — SODIUM CHLORIDE 0.9% FLUSH: 10.0000 mL | INTRAVENOUS | Status: DC | PRN

## 2018-11-30 MED ORDER — HYDROMORPHONE HCL 1 MG/ML IJ SOLN
2.0000 mg | Freq: Two times a day (BID) | INTRAMUSCULAR | Status: DC | PRN
  Administered 2018-11-30 – 2018-12-02 (×4): 2 mg via INTRAVENOUS
  Filled 2018-11-30 (×3): qty 2

## 2018-11-30 MED ORDER — LORAZEPAM 2 MG/ML IJ SOLN
0.5000 mg | Freq: Once | INTRAMUSCULAR | Status: AC
  Administered 2018-11-30: 0.5 mg via INTRAVENOUS
  Filled 2018-11-30: qty 1

## 2018-11-30 MED ORDER — POTASSIUM CHLORIDE CRYS ER 20 MEQ PO TBCR
40.0000 meq | EXTENDED_RELEASE_TABLET | Freq: Two times a day (BID) | ORAL | Status: DC
  Administered 2018-11-30: 40 meq via ORAL
  Filled 2018-11-30: qty 2

## 2018-11-30 MED ORDER — POTASSIUM CHLORIDE 20 MEQ PO PACK
40.0000 meq | PACK | Freq: Two times a day (BID) | ORAL | Status: DC
  Administered 2018-11-30 – 2018-12-01 (×2): 40 meq via ORAL
  Filled 2018-11-30 (×2): qty 2

## 2018-11-30 NOTE — Progress Notes (Signed)
Surgery:  Right mastectomy wound dressing changed this hour. 6 N. nursing staff present to observe Holly Riles, RN,WOC also present  Wound is clean.  Repacked with saline moistened Kerlix  Because of recess anterior to pectoral muscle she is not a good candidate for negative pressure dressing at this time  Plan: Saline and Kerlix wet-to-dry dressing           Twice daily            Nursing staff will be able to do this            Will initiate HHN  Vancomycin was apparently discontinued and she is started on Bactrim DS twice daily   Holly Wiley, M.D., [facility] General and Minimally invasive Surgery Breast and Colorectal Surgery Office:   [phone] Pager:   [phone]

## 2018-11-30 NOTE — Consult Note (Signed)
[place] Nurse wound consult note Reason for Consult: To meet surgeon for right breast dressing change. Per primary RN, Precious Bard, MD is requesting to perform dressing change at 11:30/. Val Riles, RN, MSN, CWOCN, CNS-BC, pager [phone]

## 2018-11-30 NOTE — Progress Notes (Signed)
[facility] Infectious Disease  Date of Admission:  11/25/2018     Total days of antibiotics 6         ASSESSMENT/PLAN  Holly Wiley has a postsurgical soft tissue infection/mastitis status post right mastectomy wound exploration/dressing change.  Initial cultures with Staphylococcus aureus and stenotrophomonas with concern for sampling from JP drain.  Aspiration with no growth to date.  Surgical specimens from 11/28/2018 with staph aureus and sensitivities pending.  Postsurgical soft tissue infection with staph aureus - POD 1 and doing well with dressing change at bedside planned. Cultures as noted positive for staphylococcus aureus with sensitivities pending.  Discontinue vancomycin.  Start Bactrim which will cover MSSA, MRSA, and stenotrophomonas.  Continue wound care per Dr. Dalbert Batman. Will plan for treatment with 2 weeks of Bactrim from surgical date of 11/28/18 with end date of 12/13/18.   Therapeutic drug monitoring -renal function appears stable with no evidence of nephrotoxicity.  Pharmacy continues to monitor area under curve and adjust medication as appropriate.   -Principal Problem:   Postoperative wound infection Active Problems:   Malignant neoplasm of upper-outer quadrant of right breast in female, estrogen receptor negative ([place])   Port-A-Cath in place   Nausea and vomiting   Breast cancer ([place])   Wound infection after surgery   . diphenhydrAMINE  25 mg Intravenous Once  . enoxaparin (LOVENOX) injection  40 mg Subcutaneous Q24H  . gabapentin  900 mg Oral QID  . ketorolac  10 mg Oral Q8H  . potassium chloride  40 mEq Oral BID  . senna-docusate  1 tablet Oral BID  . sodium chloride flush  10-40 mL Intracatheter Q12H    SUBJECTIVE:  Mildly elevated temperature of 100.2 overnight with no acute events.  Dressing change planned at bedside today.  Continues to have chest pain.  Allergies  Allergen Reactions  . Bee Venom Anaphylaxis  . Dihydroergotamine  Anaphylaxis  . Imitrex [Sumatriptan] Other (See Comments)    SEIZURE-LIKE ACTIVITY  . Metoclopramide Other (See Comments)    TACHYCARDIA  . Transderm-Scop [Scopolamine] Other (See Comments)    AGITATION     Review of Systems: Review of Systems  Constitutional: Negative for chills, fever and weight loss.  Respiratory: Negative for cough, shortness of breath and wheezing.   Cardiovascular: Negative for chest pain and leg swelling.  Gastrointestinal: Negative for abdominal pain, constipation, diarrhea, nausea and vomiting.  Skin: Negative for rash.      OBJECTIVE: Vitals:   11/29/18 2024 11/29/18 2307 11/30/18 0328 11/30/18 1004  BP: 110/72 108/68 102/62 110/72  Pulse: 92 92 93 96  Resp: 16 16 16    Temp: 99.1 F (37.3 C) 100.2 F (37.9 C) 98.6 F (37 C) 99.2 F (37.3 C)  TempSrc: Oral Oral Oral Oral  SpO2: 97% 99% 94% 91%  Weight:      Height:       Body mass index is 30.98 kg/m.  Physical Exam Constitutional:      General: She is not in acute distress.    Appearance: She is well-developed.  Cardiovascular:     Rate and Rhythm: Normal rate and regular rhythm.     Heart sounds: Normal heart sounds.     Comments: PICC line in left upper arm with dressing that is clean and dry. No evidence of infection.  Pulmonary:     Effort: Pulmonary effort is normal.     Breath sounds: Normal breath sounds.  Skin:    General: Skin is warm and  dry.  Neurological:     Mental Status: She is alert and oriented to person, place, and time.     Lab Results Lab Results  Component Value Date   WBC 6.1 11/29/2018   HGB 8.9 (L) 11/29/2018   HCT 27.8 (L) 11/29/2018   MCV 103.0 (H) 11/29/2018   PLT 164 11/29/2018    Lab Results  Component Value Date   CREATININE 0.62 11/29/2018   BUN 6 11/29/2018   NA 138 11/29/2018   K 3.1 (L) 11/29/2018   CL 103 11/29/2018   CO2 24 11/29/2018    Lab Results  Component Value Date   ALT 25 11/25/2018   AST 22 11/25/2018   ALKPHOS 67  11/25/2018   BILITOT 0.4 11/25/2018     Microbiology: Recent Results (from the past 240 hour(s))  SARS Coronavirus 2 (CEPHEID - Performed in [place] hospital lab), Hosp Order     Status: None   Collection Time: 11/25/18  9:22 PM  Result Value Ref Range Status   SARS Coronavirus 2 NEGATIVE NEGATIVE Final    Comment: (NOTE) If result is NEGATIVE SARS-CoV-2 target nucleic acids are NOT DETECTED. The SARS-CoV-2 RNA is generally detectable in upper and lower  respiratory specimens during the acute phase of infection. The lowest  concentration of SARS-CoV-2 viral copies this assay can detect is 250  copies / mL. A negative result does not preclude SARS-CoV-2 infection  and should not be used as the sole basis for treatment or other  patient management decisions.  A negative result may occur with  improper specimen collection / handling, submission of specimen other  than nasopharyngeal swab, presence of viral mutation(s) within the  areas targeted by this assay, and inadequate number of viral copies  (<250 copies / mL). A negative result must be combined with clinical  observations, patient history, and epidemiological information. If result is POSITIVE SARS-CoV-2 target nucleic acids are DETECTED. The SARS-CoV-2 RNA is generally detectable in upper and lower  respiratory specimens dur ing the acute phase of infection.  Positive  results are indicative of active infection with SARS-CoV-2.  Clinical  correlation with patient history and other diagnostic information is  necessary to determine patient infection status.  Positive results do  not rule out bacterial infection or co-infection with other viruses. If result is PRESUMPTIVE POSTIVE SARS-CoV-2 nucleic acids MAY BE PRESENT.   A presumptive positive result was obtained on the submitted specimen  and confirmed on repeat testing.  While 2019 novel coronavirus  (SARS-CoV-2) nucleic acids may be present in the submitted sample   additional confirmatory testing may be necessary for epidemiological  and / or clinical management purposes  to differentiate between  SARS-CoV-2 and other Sarbecovirus currently known to infect humans.  If clinically indicated additional testing with an alternate test  methodology [id number]) is advised. The SARS-CoV-2 RNA is generally  detectable in upper and lower respiratory sp ecimens during the acute  phase of infection. The expected result is Negative. Fact Sheet for Patients:  StrictlyIdeas.no Fact Sheet for Healthcare Providers: BankingDealers.co.za This test is not yet approved or cleared by the [place] FDA and has been authorized for detection and/or diagnosis of SARS-CoV-2 by FDA under an Emergency Use Authorization (EUA).  This EUA will remain in effect (meaning this test can be used) for the duration of the COVID-19 declaration under Section 564(b)(1) of the Act, 21 U.S.C. section 360bbb-3(b)(1), unless the authorization is terminated or revoked sooner. Performed at [facility],  [address] [place], [place] [address]   Blood Culture (routine x 2)     Status: None   Collection Time: 11/25/18  9:34 PM  Result Value Ref Range Status   Specimen Description BLOOD LEFT HAND  Final   Special Requests   Final    BOTTLES DRAWN AEROBIC AND ANAEROBIC Blood Culture results may not be optimal due to an excessive volume of blood received in culture bottles   Culture   Final    NO GROWTH 5 DAYS Performed at [facility], [place] [address] [place], [place] [address]    Report Status 11/30/2018 FINAL  Final  Blood Culture (routine x 2)     Status: None   Collection Time: 11/25/18  9:54 PM  Result Value Ref Range Status   Specimen Description BLOOD LEFT HAND  Final   Special Requests   Final    BOTTLES DRAWN AEROBIC ONLY Blood Culture results may not be optimal due to an excessive volume of blood received in culture  bottles   Culture   Final    NO GROWTH 5 DAYS Performed at [facility], [place] [address] [place], [place] [address]    Report Status 11/30/2018 FINAL  Final  Aerobic/Anaerobic Culture (surgical/deep wound)     Status: None (Preliminary result)   Collection Time: 11/25/18 10:47 PM  Result Value Ref Range Status   Specimen Description WOUND  Final   Special Requests DRAIN IS MALODOROUS  Final   Gram Stain   Final    MODERATE WBC PRESENT, PREDOMINANTLY PMN ABUNDANT GRAM NEGATIVE RODS MODERATE GRAM POSITIVE COCCI FEW GRAM POSITIVE RODS Performed at [facility], [place] [address], [place], [place] [address]    Culture   Final    ABUNDANT STAPHYLOCOCCUS AUREUS ABUNDANT STENOTROPHOMONAS MALTOPHILIA NO ANAEROBES ISOLATED; CULTURE IN PROGRESS FOR 5 DAYS    Report Status PENDING  Incomplete   Organism ID, Bacteria STAPHYLOCOCCUS AUREUS  Final   Organism ID, Bacteria STENOTROPHOMONAS MALTOPHILIA  Final      Susceptibility   Staphylococcus aureus - MIC*    CIPROFLOXACIN <=0.5 SENSITIVE Sensitive     ERYTHROMYCIN >=8 RESISTANT Resistant     GENTAMICIN <=0.5 SENSITIVE Sensitive     OXACILLIN <=0.25 SENSITIVE Sensitive     TETRACYCLINE <=1 SENSITIVE Sensitive     VANCOMYCIN 1 SENSITIVE Sensitive     TRIMETH/SULFA <=10 SENSITIVE Sensitive     CLINDAMYCIN RESISTANT Resistant     RIFAMPIN <=0.5 SENSITIVE Sensitive     Inducible Clindamycin POSITIVE Resistant     * ABUNDANT STAPHYLOCOCCUS AUREUS   Stenotrophomonas maltophilia - MIC*    LEVOFLOXACIN 1 SENSITIVE Sensitive     TRIMETH/SULFA <=20 SENSITIVE Sensitive     * ABUNDANT STENOTROPHOMONAS MALTOPHILIA  Aerobic/Anaerobic Culture (surgical/deep wound)     Status: None (Preliminary result)   Collection Time: 11/26/18 12:04 PM  Result Value Ref Range Status   Specimen Description BREAST  Final   Special Requests RIGHT  Final   Gram Stain   Final    RARE WBC PRESENT, PREDOMINANTLY PMN NO ORGANISMS SEEN    Culture   Final     NO GROWTH 3 DAYS NO ANAEROBES ISOLATED; CULTURE IN PROGRESS FOR 5 DAYS Performed at [facility], [address], [place], [place] [address]    Report Status PENDING  Incomplete  Surgical PCR screen     Status: Abnormal   Collection Time: 11/28/18  1:24 AM  Result Value Ref Range  Status   MRSA, PCR NEGATIVE NEGATIVE Final   Staphylococcus aureus POSITIVE (A) NEGATIVE Final    Comment: (NOTE) The Xpert SA Assay (FDA approved for NASAL specimens in patients 37 years of age and older), is one component of a comprehensive surveillance program. It is not intended to diagnose infection nor to guide or monitor treatment. Performed at [facility], [place] [address] [place], [place] [address]   Aerobic/Anaerobic Culture (surgical/deep wound)     Status: None (Preliminary result)   Collection Time: 11/28/18  9:40 AM  Result Value Ref Range Status   Specimen Description WOUND RIGHT MASTECTOMY SPEC A  Final   Special Requests PATIENT ON FOLLOWING CEFEPIME VANC  Final   Gram Stain   Final    NO WBC SEEN RARE GRAM POSITIVE COCCI IN PAIRS IN CLUSTERS Performed at [facility], [place] [address] [place], [place] [address]    Culture FEW STAPHYLOCOCCUS AUREUS  Final   Report Status PENDING  Incomplete  Aerobic/Anaerobic Culture (surgical/deep wound)     Status: None (Preliminary result)   Collection Time: 11/28/18  9:44 AM  Result Value Ref Range Status   Specimen Description TISSUE RIGHT MASTECTOMY SPEC B  Final   Special Requests PATIENT ON FOLLOWING CEFEPIME VANC  Final   Gram Stain   Final    RARE WBC PRESENT,BOTH PMN AND MONONUCLEAR NO ORGANISMS SEEN    Culture   Final    RARE STAPHYLOCOCCUS AUREUS CRITICAL RESULT CALLED TO, READ BACK BY AND VERIFIED WITH: A. GODDARD RN, AT 1009 11/29/18 BY D. VANHOOK  REGARDING CULTURE GROWTH SUSCEPTIBILITIES TO FOLLOW Performed at [facility], [place] [address], [place], [place] [address]    Report Status PENDING  Incomplete      Terri Piedra, [place] [facility] [place] Pager  11/30/2018  10:46 AM

## 2018-11-30 NOTE — Consult Note (Signed)
[facility] Nurse wound consult note Returned to [facility] 6N11 to participate in right breast dressing change.  Arrived at 11:20.  MD has not yet arrived. Val Riles, RN, MSN, CWOCN, CNS-BC, pager [phone]

## 2018-11-30 NOTE — Progress Notes (Addendum)
1 Day Post-Op  Subjective: Stable and alert.  Sitting up in chair.  Complain of incisional pain. Ambulating in hall. 1 of her main complaints is anorexia but no nausea or vomiting. Had a bowel movement and feels better about that Asking if she can have Toradol around-the-clock in hopes of reducing narcotic  Fevers resolving.  Max temp 100.2.  Heart rate 93.  Objective: Vital signs in last 24 hours: Temp:  [97.2 F (36.2 C)-100.2 F (37.9 C)] 98.6 F (37 C) (06/01 0328) Pulse Rate:  [74-98] 93 (06/01 0328) Resp:  [12-18] 16 (06/01 0328) BP: (97-111)/(58-72) 102/62 (06/01 0328) SpO2:  [94 %-100 %] 94 % (06/01 0328) Last BM Date: 11/25/18  Intake/Output from previous day: 05/31 0701 - 06/01 0700 In: 1621.6 [P.O.:600; I.V.:605.9; IV Piggyback:415.7] Out: 15 [Blood:15] Intake/Output this shift: No intake/output data recorded.  General appearance: Alert.  Cooperative.  Mild distress from incisional pain.  Does not look toxic.  Mental status normal skin warm and dry Resp: clear to auscultation bilaterally Chest wall: no tenderness, Right chest wall dressing in place.  No bleeding. GI: Abdomen soft.  Nondistended.  Nontender  Lab Results:  No results found for this or any previous visit (from the past 24 hour(s)).   Studies/Results: Us Ekg Site Rite  Result Date: 11/29/2018 If Site Rite image not attached, placement could not be confirmed due to current cardiac rhythm.   . diphenhydrAMINE  25 mg Intravenous Once  . enoxaparin (LOVENOX) injection  40 mg Subcutaneous Q24H  . gabapentin  900 mg Oral QID  . senna-docusate  1 tablet Oral BID  . sodium chloride flush  10-40 mL Intracatheter Q12H     Assessment/Plan: s/p Procedure(s): DRESSING CHANGE UNDER ANESTHESIA   Postop soft tissue infection right chest wall. -Drains removed5/28to eliminate foreign body participation in source control -Wound exploration and debridement 5, 30 -Dressing change under anesthesia  531 -Fevers resolving and wound looked good yesterday.  I think the infection is coming under control. -Now on monotherapy with vancomycin.  Appreciate ID recommendations -We will need to discuss about transitioning to outpatient management with oral therapy once cultures finalized -Plan dressing change at bedside today.  Discussed with patient.  Involve WOC nursing.   Right arm neuropathy and neuritis.Suspect this is related to her right axillary sentinel node biopsy. Continue gabapentin.She says this pain is less than the right chest wall pain. -She did have D5W injection therapy by Dr. Ramos 1 week prior to the onset of this infection in the right upper arm.  Not sure whether this has any relationship  to the infection  Right breast cancer, triple negative -She had a complete pathologic response from her neoadjuvant chemotherapy Status post neoadjuvant chemotherapy -Followed by Dr.Gudena  Analgesics -add Toradol, 10 mg p.o. every 8 hours at patient's request for 6 doses.  -Keep an eye on creatinine since she is on vancomycin Constipation.Add MiraLAX and Senokot-resolved Anorexia - -advised 5-6 small meals per day Hypokalemia. IV adjusted.    Add oral supplementation.  B met tomorrow  Macromastia   @PROBHOSP@  LOS: 4 days     M  11/30/2018  . .prob 

## 2018-11-30 NOTE — Progress Notes (Signed)
Peripherally Inserted Central Catheter/Midline Placement  The IV Nurse has discussed with the patient and/or persons authorized to consent for the patient, the purpose of this procedure and the potential benefits and risks involved with this procedure.  The benefits include less needle sticks, lab draws from the catheter, and the patient may be discharged home with the catheter. Risks include, but not limited to, infection, bleeding, blood clot (thrombus formation), and puncture of an artery; nerve damage and irregular heartbeat and possibility to perform a PICC exchange if needed/ordered by physician.  Alternatives to this procedure were also discussed.  Bard Power PICC patient education guide, fact sheet on infection prevention and patient information card has been provided to patient /or left at bedside.    PICC/Midline Placement Documentation  PICC Single Lumen 11/30/18 PICC Right Brachial 41 cm 0 cm (Active)  Indication for Insertion or Continuance of Line Prolonged intravenous therapies 11/30/2018 11:26 AM  Exposed Catheter (cm) 0 cm 11/30/2018 11:26 AM  Site Assessment Clean;Dry;Intact 11/30/2018 11:26 AM  Line Status Flushed;Blood return noted 11/30/2018 11:26 AM  Dressing Type Transparent 11/30/2018 11:26 AM  Dressing Status Clean;Dry;Intact;Antimicrobial disc in place 11/30/2018 11:26 AM  Dressing Intervention New dressing;Other (Comment) 11/30/2018 11:26 AM  Dressing Change Due 12/07/18 11/30/2018 11:26 AM       Holly Wiley 11/30/2018, 11:27 AM

## 2018-11-30 NOTE — Consult Note (Signed)
[facility] Nurse wound consult note Patient receiving care in [facility]N11. Dr. Dalbert Batman removed the packing he had placed in the OR previously.  The patient had been pre-medicated for the procedure. Reason for Consult: Input on dressing to the right breast wound Wound type: surgical Wound bed: pink, clean, granulation tissue Drainage (amount, consistency, odor) serosanginous on removed packing Periwound: intact Dressing procedure/placement/frequency: Premedicate patient for right breast wound dressing change. Gently remove kerlex. Using your gloved hand place one roll of saline moistened gauze into the wound. Be sure to place it in the "pocket" on top of the breast and towards the sternum, and the "pocket" to the lateral side towards the axilla. Cover with two ABD pads. Tape in place. Place binder in position. Monitor the wound area(s) for worsening of condition such as: Signs/symptoms of infection,  Increase in size,  Development of or worsening of odor, Development of pain, or increased pain at the affected locations.  Notify the medical team if any of these develop.  Thank you for the consult.  Discussed plan of care with the patient and bedside nurse.  [facility] nurse will not follow at this time.  Please re-consult the [facility] team if needed.  Val Riles, RN, MSN, CWOCN, CNS-BC, pager [phone]

## 2018-12-01 LAB — BASIC METABOLIC PANEL
Anion gap: 11 (ref 5–15)
BUN: <5 mg/dL — ABNORMAL LOW (ref 6–20)
CO2: 27 mmol/L (ref 22–32)
Calcium: 8.8 mg/dL — ABNORMAL LOW (ref 8.9–10.3)
Chloride: 100 mmol/L (ref 98–111)
Creatinine, Ser: 0.75 mg/dL (ref 0.44–1.00)
GFR calc Af Amer: >60 mL/min (ref >60)
GFR calc non Af Amer: >60 mL/min (ref >60)
Glucose, Bld: 104 mg/dL — ABNORMAL HIGH (ref 70–99)
Potassium: 4 mmol/L (ref 3.5–5.1)
Sodium: 138 mmol/L (ref 135–145)

## 2018-12-01 LAB — AEROBIC/ANAEROBIC CULTURE W GRAM STAIN (SURGICAL/DEEP WOUND): Culture: NO GROWTH

## 2018-12-01 MED ORDER — ADULT MULTIVITAMIN W/MINERALS CH
1.0000 | ORAL_TABLET | Freq: Every day | ORAL | Status: DC
  Administered 2018-12-01 – 2018-12-02 (×2): 1 via ORAL
  Filled 2018-12-01 (×2): qty 1

## 2018-12-01 NOTE — Progress Notes (Signed)
[facility]  Date of Admission:  11/25/2018     Total days of antibiotics 7         ASSESSMENT/PLAN  Ms. Shackett has a postsurgical soft tissue infection/mastitis status post right mastectomy wound exploration/dressing change.  Initial cultures with Staphylococcus aureus and stenotrophomonas with concern for sampling from JP drain.  Aspiration with no growth to date.  Surgical specimens from 11/28/2018 with MSSA. Current antimicrobial therapy with Bactrim.  Postsurgical soft tissue infection with MSSA/Steotrophomonas -  POD 2 with continued pain at the surgical site. Dressings changed by Dr. Dalbert Batman without purulence or odor. Continue current dose of Bactrim to cover both MSSA and Stenotrophomonas until 12/13/18.   Therapeutic Drug Monitoring - Renal function stable with current dose of Bactrim.   Pain - Continues to have right sided chest pain which appears slightly improved, although continues to require pain medications. Continue management per primary team.    Principal Problem:   Postoperative wound infection Active Problems:   Malignant neoplasm of upper-outer quadrant of right breast in female, estrogen receptor negative ([place])   Port-A-Cath in place   Nausea and vomiting   Breast cancer ([place])   Wound infection after surgery   . diphenhydrAMINE  25 mg Intravenous Once  . enoxaparin (LOVENOX) injection  40 mg Subcutaneous Q24H  . gabapentin  900 mg Oral QID  . ketorolac  10 mg Oral Q8H  . mupirocin ointment   Nasal BID  . potassium chloride  40 mEq Oral BID  . senna-docusate  1 tablet Oral BID  . sodium chloride flush  10-40 mL Intracatheter Q12H  . sulfamethoxazole-trimethoprim  1 tablet Oral Q12H    SUBJECTIVE:  Afebrile overnight with no acute events. Continues to have right sided chest pain.   Allergies  Allergen Reactions  . Bee Venom Anaphylaxis  . Dihydroergotamine Anaphylaxis  . Imitrex [Sumatriptan] Other (See Comments)   SEIZURE-LIKE ACTIVITY  . Metoclopramide Other (See Comments)    TACHYCARDIA  . Transderm-Scop [Scopolamine] Other (See Comments)    AGITATION     Review of Systems: Review of Systems  Constitutional: Negative for chills, fever and weight loss.  Respiratory: Negative for cough, shortness of breath and wheezing.   Cardiovascular: Positive for chest pain (Chest wall pain). Negative for leg swelling.  Gastrointestinal: Negative for abdominal pain, constipation, diarrhea, nausea and vomiting.  Skin: Negative for rash.      OBJECTIVE: Vitals:   11/30/18 1004 11/30/18 1511 11/30/18 2009 12/01/18 0445  BP: 110/72 (!) 116/51 139/64 (!) 126/52  Pulse: 96 98 (!) 101 (!) 110  Resp:   16 16  Temp: 99.2 F (37.3 C) 98.9 F (37.2 C) 99.6 F (37.6 C) 98.9 F (37.2 C)  TempSrc: Oral Oral Oral Oral  SpO2: 91% 97% 99% 97%  Weight:      Height:       Body mass index is 30.98 kg/m.  Physical Exam Constitutional:      General: She is not in acute distress.    Appearance: She is well-developed.     Comments: Lying in bed with head of bed elevated; drowsy  Cardiovascular:     Rate and Rhythm: Normal rate and regular rhythm.     Heart sounds: Normal heart sounds.  Pulmonary:     Effort: Pulmonary effort is normal.     Breath sounds: Normal breath sounds.  Chest:     Comments: Surgical dressing are clean and dry.  Skin:    General:  Skin is warm and dry.  Neurological:     General: No focal deficit present.     Mental Status: She is alert.     Lab Results Lab Results  Component Value Date   WBC 6.1 11/29/2018   HGB 8.9 (L) 11/29/2018   HCT 27.8 (L) 11/29/2018   MCV 103.0 (H) 11/29/2018   PLT 164 11/29/2018    Lab Results  Component Value Date   CREATININE 0.75 12/01/2018   BUN <5 (L) 12/01/2018   NA 138 12/01/2018   K 4.0 12/01/2018   CL 100 12/01/2018   CO2 27 12/01/2018    Lab Results  Component Value Date   ALT 25 11/25/2018   AST 22 11/25/2018   ALKPHOS 67  11/25/2018   BILITOT 0.4 11/25/2018     Microbiology: Recent Results (from the past 240 hour(s))  SARS Coronavirus 2 (CEPHEID - Performed in [place] hospital lab), Hosp Order     Status: None   Collection Time: 11/25/18  9:22 PM  Result Value Ref Range Status   SARS Coronavirus 2 NEGATIVE NEGATIVE Final    Comment: (NOTE) If result is NEGATIVE SARS-CoV-2 target nucleic acids are NOT DETECTED. The SARS-CoV-2 RNA is generally detectable in upper and lower  respiratory specimens during the acute phase of infection. The lowest  concentration of SARS-CoV-2 viral copies this assay can detect is 250  copies / mL. A negative result does not preclude SARS-CoV-2 infection  and should not be used as the sole basis for treatment or other  patient management decisions.  A negative result may occur with  improper specimen collection / handling, submission of specimen other  than nasopharyngeal swab, presence of viral mutation(s) within the  areas targeted by this assay, and inadequate number of viral copies  (<250 copies / mL). A negative result must be combined with clinical  observations, patient history, and epidemiological information. If result is POSITIVE SARS-CoV-2 target nucleic acids are DETECTED. The SARS-CoV-2 RNA is generally detectable in upper and lower  respiratory specimens dur ing the acute phase of infection.  Positive  results are indicative of active infection with SARS-CoV-2.  Clinical  correlation with patient history and other diagnostic information is  necessary to determine patient infection status.  Positive results do  not rule out bacterial infection or co-infection with other viruses. If result is PRESUMPTIVE POSTIVE SARS-CoV-2 nucleic acids MAY BE PRESENT.   A presumptive positive result was obtained on the submitted specimen  and confirmed on repeat testing.  While 2019 novel coronavirus  (SARS-CoV-2) nucleic acids may be present in the submitted sample   additional confirmatory testing may be necessary for epidemiological  and / or clinical management purposes  to differentiate between  SARS-CoV-2 and other Sarbecovirus currently known to infect humans.  If clinically indicated additional testing with an alternate test  methodology [phone] is advised. The SARS-CoV-2 RNA is generally  detectable in upper and lower respiratory sp ecimens during the acute  phase of infection. The expected result is Negative. Fact Sheet for Patients:  StrictlyIdeas.no Fact Sheet for Healthcare Providers: BankingDealers.co.za This test is not yet approved or cleared by the [place] FDA and has been authorized for detection and/or diagnosis of SARS-CoV-2 by FDA under an Emergency Use Authorization (EUA).  This EUA will remain in effect (meaning this test can be used) for the duration of the COVID-19 declaration under Section 564(b)(1) of the Act, 21 U.S.C. section 360bbb-3(b)(1), unless the authorization is terminated or revoked sooner.  Performed at [facility], [place] [address] [place], [place] [address]   Blood Culture (routine x 2)     Status: None   Collection Time: 11/25/18  9:34 PM  Result Value Ref Range Status   Specimen Description BLOOD LEFT HAND  Final   Special Requests   Final    BOTTLES DRAWN AEROBIC AND ANAEROBIC Blood Culture results may not be optimal due to an excessive volume of blood received in culture bottles   Culture   Final    NO GROWTH 5 DAYS Performed at [facility], [place] [address] [place], [place] [address]    Report Status 11/30/2018 FINAL  Final  Blood Culture (routine x 2)     Status: None   Collection Time: 11/25/18  9:54 PM  Result Value Ref Range Status   Specimen Description BLOOD LEFT HAND  Final   Special Requests   Final    BOTTLES DRAWN AEROBIC ONLY Blood Culture results may not be optimal due to an excessive volume of blood received in culture  bottles   Culture   Final    NO GROWTH 5 DAYS Performed at [facility], [place] [address], [place], [place] [address]    Report Status 11/30/2018 FINAL  Final  Aerobic/Anaerobic Culture (surgical/deep wound)     Status: None   Collection Time: 11/25/18 10:47 PM  Result Value Ref Range Status   Specimen Description WOUND  Final   Special Requests DRAIN IS MALODOROUS  Final   Gram Stain   Final    MODERATE WBC PRESENT, PREDOMINANTLY PMN ABUNDANT GRAM NEGATIVE RODS MODERATE GRAM POSITIVE COCCI FEW GRAM POSITIVE RODS    Culture   Final    ABUNDANT STAPHYLOCOCCUS AUREUS ABUNDANT STENOTROPHOMONAS MALTOPHILIA NO ANAEROBES ISOLATED Performed at [facility], [place] [address], [place], [place] [address]    Report Status 12/01/2018 FINAL  Final   Organism ID, Bacteria STAPHYLOCOCCUS AUREUS  Final   Organism ID, Bacteria STENOTROPHOMONAS MALTOPHILIA  Final      Susceptibility   Staphylococcus aureus - MIC*    CIPROFLOXACIN <=0.5 SENSITIVE Sensitive     ERYTHROMYCIN >=8 RESISTANT Resistant     GENTAMICIN <=0.5 SENSITIVE Sensitive     OXACILLIN <=0.25 SENSITIVE Sensitive     TETRACYCLINE <=1 SENSITIVE Sensitive     VANCOMYCIN 1 SENSITIVE Sensitive     TRIMETH/SULFA <=10 SENSITIVE Sensitive     CLINDAMYCIN RESISTANT Resistant     RIFAMPIN <=0.5 SENSITIVE Sensitive     Inducible Clindamycin POSITIVE Resistant     * ABUNDANT STAPHYLOCOCCUS AUREUS   Stenotrophomonas maltophilia - MIC*    LEVOFLOXACIN 1 SENSITIVE Sensitive     TRIMETH/SULFA <=20 SENSITIVE Sensitive     * ABUNDANT STENOTROPHOMONAS MALTOPHILIA  Aerobic/Anaerobic Culture (surgical/deep wound)     Status: None   Collection Time: 11/26/18 12:04 PM  Result Value Ref Range Status   Specimen Description BREAST  Final   Special Requests RIGHT  Final   Gram Stain   Final    RARE WBC PRESENT, PREDOMINANTLY PMN NO ORGANISMS SEEN    Culture   Final    No growth aerobically or anaerobically. Performed at [facility], [place] [address] [place], [place] [address]    Report Status 12/01/2018 FINAL  Final  Surgical PCR screen     Status: Abnormal   Collection Time: 11/28/18  1:24 AM  Result Value Ref Range Status   MRSA, PCR NEGATIVE NEGATIVE Final  Staphylococcus aureus POSITIVE (A) NEGATIVE Final    Comment: (NOTE) The Xpert SA Assay (FDA approved for NASAL specimens in patients 2 years of age and older), is one component of a comprehensive surveillance program. It is not intended to diagnose infection nor to guide or monitor treatment. Performed at [facility], [place] [address], [place], [place] [address]   Aerobic/Anaerobic Culture (surgical/deep wound)     Status: None (Preliminary result)   Collection Time: 11/28/18  9:40 AM  Result Value Ref Range Status   Specimen Description WOUND RIGHT MASTECTOMY SPEC A  Final   Special Requests PATIENT ON FOLLOWING CEFEPIME VANC  Final   Gram Stain   Final    NO WBC SEEN RARE GRAM POSITIVE COCCI IN PAIRS IN CLUSTERS Performed at [facility], [place] [address], [place], [place] [address]    Culture   Final    FEW STAPHYLOCOCCUS AUREUS NO ANAEROBES ISOLATED; CULTURE IN PROGRESS FOR 5 DAYS    Report Status PENDING  Incomplete   Organism ID, Bacteria STAPHYLOCOCCUS AUREUS  Final      Susceptibility   Staphylococcus aureus - MIC*    CIPROFLOXACIN <=0.5 SENSITIVE Sensitive     ERYTHROMYCIN RESISTANT Resistant     GENTAMICIN <=0.5 SENSITIVE Sensitive     OXACILLIN 0.5 SENSITIVE Sensitive     TETRACYCLINE <=1 SENSITIVE Sensitive     VANCOMYCIN 1 SENSITIVE Sensitive     TRIMETH/SULFA <=10 SENSITIVE Sensitive     CLINDAMYCIN RESISTANT Resistant     RIFAMPIN <=0.5 SENSITIVE Sensitive     Inducible Clindamycin POSITIVE Resistant     * FEW STAPHYLOCOCCUS AUREUS  Aerobic/Anaerobic Culture (surgical/deep wound)     Status: None (Preliminary result)   Collection Time: 11/28/18  9:44 AM  Result Value Ref Range Status   Specimen Description  TISSUE RIGHT MASTECTOMY SPEC B  Final   Special Requests PATIENT ON FOLLOWING CEFEPIME VANC  Final   Gram Stain   Final    RARE WBC PRESENT,BOTH PMN AND MONONUCLEAR NO ORGANISMS SEEN Performed at [facility], [place] [address], [place], [place] [address]    Culture   Final    RARE STAPHYLOCOCCUS AUREUS CRITICAL RESULT CALLED TO, READ BACK BY AND VERIFIED WITH: A. GODDARD RN, AT 1009 11/29/18 BY D. VANHOOK  REGARDING CULTURE GROWTH NO ANAEROBES ISOLATED; CULTURE IN PROGRESS FOR 5 DAYS    Report Status PENDING  Incomplete   Organism ID, Bacteria STAPHYLOCOCCUS AUREUS  Final      Susceptibility   Staphylococcus aureus - MIC*    CIPROFLOXACIN <=0.5 SENSITIVE Sensitive     ERYTHROMYCIN >=8 RESISTANT Resistant     GENTAMICIN <=0.5 SENSITIVE Sensitive     OXACILLIN 0.5 SENSITIVE Sensitive     TETRACYCLINE <=1 SENSITIVE Sensitive     VANCOMYCIN <=0.5 SENSITIVE Sensitive     TRIMETH/SULFA <=10 SENSITIVE Sensitive     CLINDAMYCIN RESISTANT Resistant     RIFAMPIN <=0.5 SENSITIVE Sensitive     Inducible Clindamycin POSITIVE Resistant     * RARE STAPHYLOCOCCUS AUREUS     Terri Piedra, NP [facility] [phone] Pager  12/01/2018  12:36 PM

## 2018-12-01 NOTE — Progress Notes (Signed)
Initial Nutrition Assessment  RD working remotely.  DOCUMENTATION CODES:   Obesity unspecified  INTERVENTION:   -MVI with minerals daily -Provided "Tips for Increasing Calories and Protein: handout from [facility] Nutrition Care Manual; text pasted into AVS/ discharge summary -Last documented BM on 11/29/18; consider initiation of bowel regimen (senna ordered on 11/27/18)  NUTRITION DIAGNOSIS:   Increased nutrient needs related to post-op healing, wound healing as evidenced by estimated needs.  GOAL:   Patient will meet greater than or equal to 90% of their needs  MONITOR:   PO intake, Labs, Weight trends, Skin, I & O's  REASON FOR ASSESSMENT:   Other (Comment)    ASSESSMENT:   NOBLE BODIE an 43 y.o.females/p bilateral TM and R SLNBx 5/8 here withright chest wall pain, cellulitis, possible abscess vs hematoma on right side  Pt admitted with post-operative soft tissue infection to rt chest wall.   5/28- s/p US aspiration of rt chest wall fluid (10 cc aspirated), cultures sent 5/30- s/p Procedure: Exploration, debridement, and drainage of infection right mastectomy wound 5/31- s/p procedure: Right mastectomy wound exploration, dressing change; lab reports tissue culture gram stain negative but culture gram stain is growing a rare staphococcy 6/1- PICC placed  Reviewed I/O's: +480 ml x 24 hours and +9.9 L since admission  Spoke with Dory Peru, RD at [facility], who has followed pt in the past. She informed RD that she received a call from pt's mother, who expressed concern over nutritional status in the hospital. Per RD report, mother reports pt with nausea and not eating well at all and is very concerned about this. [place] RD requested RD assess pt during inpatient stay due to concern of poor oral intake.   Per general surgery notes, wound is clean per dressing change on 11/30/18 and packing placed in OR removed; not a good wound vac candidate secondary to recess anterior to  pectoral muscle. Per nursing assessment today, rt breast had minimal amount of drainage (serosanguineous and small areas with light green spots on gauze) and no drainage to lt breast during dressing change this AM.   Attempted to speak with pt via phone, however, unable to reach. Reviewed meal completion records; noted intake has improved over the past few days (2 meal refusals on 11/27/18), however, pt now consuming 50-100% of meals.   Pt has not had a BM since 11/27/18; suspect constipation may be impacting PO intake and may require bowel regimen. Per [facility] RD notes, pt historically does not tolerate supplements well and has been consuming small, frequent meals daily to optimize nutrition.   Pt will likely d/c home tomorrow with dressing changes BID (to be assisted by mother and friend who is an Therapist, sports).   Medications reviewed and include senna (ordered on 11/27/18)  Labs reviewed: K: 3.1.   NUTRITION - FOCUSED PHYSICAL EXAM:    Most Recent Value  Orbital Region  Unable to assess  Upper Arm Region  Unable to assess  Thoracic and Lumbar Region  Unable to assess  Buccal Region  Unable to assess  Temple Region  Unable to assess  Clavicle Bone Region  Unable to assess  Clavicle and Acromion Bone Region  Unable to assess  Scapular Bone Region  Unable to assess  Dorsal Hand  Unable to assess  Patellar Region  Unable to assess  Anterior Thigh Region  Unable to assess  Posterior Calf Region  Unable to assess  Edema (RD Assessment)  Unable to assess  Hair  Unable to  assess  Eyes  Unable to assess  Mouth  Unable to assess  Skin  Unable to assess  Nails  Unable to assess       Diet Order:   Diet Order            Diet Heart Room service appropriate? Yes; Fluid consistency: Thin  Diet effective now              EDUCATION NEEDS:   No education needs have been identified at this time  Skin:  Skin Assessment: Skin Integrity Issues: Skin Integrity Issues:: Incisions Incisions: rt chest,  rt breast  Last BM:  11/29/18  Height:   Ht Readings from Last 1 Encounters:  11/25/18 5\' 7"  (1.702 m)    Weight:   Wt Readings from Last 1 Encounters:  11/25/18 89.7 kg    Ideal Body Weight:  61.4 kg  BMI:  Body mass index is 30.98 kg/m.  Estimated Nutritional Needs:   Kcal:  1850-2050  Protein:  95-110 grams  Fluid:  > 1.8 L    Barrett Holthaus A. Jimmye Norman, RD, LDN, [place] Registered Dietitian II Certified Diabetes Care and Education Specialist Pager: [phone] After hours Pager: [phone]

## 2018-12-01 NOTE — Progress Notes (Signed)
Right Breast dressing change completed. Pt medicated prior to dsg change per order. Noted minimal amount of drainage, serosanguinous and small areas with light green spots on gauze drainage. No foul odor noted. Left Breast  under the T area, noted redness and small opened area. No drainage.  Pt with c/o R surgical Breast pain, 9/10. Medicated per PRN order and f/u pain 5/10  Pt ambulated in hall way twice. Independent, BRP. Call light and phone in reach. IVF's infusing without difficulty R midline. Labs drawn this am by IV team. Rounds continued per unit protocol and MD orders.

## 2018-12-01 NOTE — Progress Notes (Signed)
2 Days Post-Op  Subjective: Overall she looks like she feels better.  Still using IV Dilaudid.  She knows we are going to have to discontinue that when she is discharged. Remaining afebrile now. Nursing staff changed dressing last night without difficulty. Patient states that she, her mother, and 1 of her friends who is a Marine scientist can learn how to do the dressing change Were planning for [facility] RN, at least at the beginning Potassium up to 4.0.  Creatinine 0.75.  Objective: Vital signs in last 24 hours: Temp:  [98.9 F (37.2 C)-99.6 F (37.6 C)] 98.9 F (37.2 C) (06/02 0445) Pulse Rate:  [98-110] 110 (06/02 0445) Resp:  [16] 16 (06/02 0445) BP: (116-139)/(51-64) 126/52 (06/02 0445) SpO2:  [97 %-99 %] 97 % (06/02 0445) Last BM Date: 11/29/18  Intake/Output from previous day: 06/01 0701 - 06/02 0700 In: 480 [P.O.:480] Out: -  Intake/Output this shift: Total I/O In: 480 [P.O.:480] Out: -   General appearance: Alert.  Cooperative.  Has some pain when she abducts her right shoulder more than about 140 degrees. Resp: clear to auscultation bilaterally Chest wall: no tenderness, Dressing change performed by me.  Curlex removed.  There is no purulence or drainage.  There is no odor.  The wound is starting to get a little bit smaller especially in the axillary area and the medial skin flap.  This was repacked with saline moistened Kerlix but I did not use an entire Kerlix today.  Redressed.  She seemed to tolerate this fairly well. Extremities: Moves all 4 extremities well.  Right shoulder abduction limited to about 150 degrees.  Minimal right arm swelling.  PICC line on left.  Lab Results:  Results for orders placed or performed during the hospital encounter of 11/25/18 (from the past 24 hour(s))  Basic metabolic panel     Status: Abnormal   Collection Time: 12/01/18  5:08 AM  Result Value Ref Range   Sodium 138 135 - 145 mmol/L   Potassium 4.0 3.5 - 5.1 mmol/L   Chloride 100 98 - 111  mmol/L   CO2 27 22 - 32 mmol/L   Glucose, Bld 104 (H) 70 - 99 mg/dL   BUN <5 (L) 6 - 20 mg/dL   Creatinine, Ser 0.75 0.44 - 1.00 mg/dL   Calcium 8.8 (L) 8.9 - 10.3 mg/dL   GFR calc non Af Amer >60 >60 mL/min   GFR calc Af Amer >60 >60 mL/min   Anion gap 11 5 - 15     Studies/Results: No results found.  . diphenhydrAMINE  25 mg Intravenous Once  . enoxaparin (LOVENOX) injection  40 mg Subcutaneous Q24H  . gabapentin  900 mg Oral QID  . ketorolac  10 mg Oral Q8H  . mupirocin ointment   Nasal BID  . potassium chloride  40 mEq Oral BID  . senna-docusate  1 tablet Oral BID  . sodium chloride flush  10-40 mL Intracatheter Q12H  . sulfamethoxazole-trimethoprim  1 tablet Oral Q12H     Assessment/Plan: s/p Procedure(s): DRESSING CHANGE UNDER ANESTHESIA   Postop soft tissue infection right chest wall. -Drains removed5/28to eliminate foreign body participation in source control -Wound exploration and debridement 5, 30 -Dressing change under anesthesia 531 -Fevers resolved -Cultures growing MSSA.   -MRSA screen positive, however  and she has started on Bactroban. -Now on Bactrim DS 1 twice daily.  Plan 2 weeks course of this per ID recommendations -Current plan is to continue inpatient twice daily dressing changes today -Likely  discharge home tomorrow after I change her dressing -HHRN   Right arm neuropathy and neuritis.Suspect this is related to her right axillary sentinel node biopsy. Continue gabapentin.She says this pain is less than the right chest wall pain. -She did have D5W injection therapy by Dr. London Pepper week prior to the onset of this infection in the right upper arm. Not sure whether this has any relationship to the infection  Right breast cancer, triple negative -She had a complete pathologic response from her neoadjuvant chemotherapy Status post neoadjuvant chemotherapy -Followed by Dr.Gudena  Analgesics -add Toradol, 10 mg p.o. every 8 hours ,  tramadol as needed, IV Dilaudid for dressing changes, OxyIR as needed.  We will try to change dressing tomorrow with OxyIR premedication Constipation.Add MiraLAX and Senokot-resolved Anorexia - -advised 5-6 small meals per day Hypokalemia.  Resolved  Macromastia - (preop diagnosis)  @PROBHOSP @  LOS: 5 days    Adin Hector 12/01/2018  . .prob

## 2018-12-01 NOTE — Discharge Instructions (Signed)

## 2018-12-01 NOTE — Plan of Care (Signed)
  Problem: Skin Integrity: Goal: Risk for impaired skin integrity will decrease Outcome: Progressing   Problem: Coping: Goal: Level of anxiety will decrease Outcome: Progressing   Problem: Skin Integrity: Goal: Risk for impaired skin integrity will decrease Outcome: Progressing   Problem: Nutrition: Goal: Adequate nutrition will be maintained Outcome: Progressing

## 2018-12-02 MED ORDER — SULFAMETHOXAZOLE-TRIMETHOPRIM 800-160 MG PO TABS: 1.0000 | ORAL_TABLET | Freq: Two times a day (BID) | ORAL | 0 refills | Status: DC

## 2018-12-02 MED ORDER — SENNOSIDES-DOCUSATE SODIUM 8.6-50 MG PO TABS: 1.0000 | ORAL_TABLET | Freq: Two times a day (BID) | ORAL | 1 refills | Status: DC

## 2018-12-02 MED ORDER — ONDANSETRON 4 MG PO TBDP: 4.0000 mg | ORAL_TABLET | Freq: Four times a day (QID) | ORAL | 1 refills | Status: DC | PRN

## 2018-12-02 MED ORDER — MUPIROCIN 2 % EX OINT: TOPICAL_OINTMENT | Freq: Two times a day (BID) | CUTANEOUS | 0 refills | Status: DC

## 2018-12-02 MED ORDER — METHOCARBAMOL 500 MG PO TABS: 500.0000 mg | ORAL_TABLET | Freq: Four times a day (QID) | ORAL | 1 refills | Status: DC | PRN

## 2018-12-02 NOTE — Plan of Care (Signed)

## 2018-12-02 NOTE — Discharge Summary (Addendum)
Patient ID: Holly Wiley [id number] 43 y.o. 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  Dr. Payton Mccallum is her oncologist.  Billy Fischer, NP is her primary care provider in [place].     She originally presented in January with a small mass in the upper outer right breast which was biopsied and showed triple negative breast cancer, grade 3.  MRI showed other areas of enhancement.  These were not biopsied because she wanted bilateral mastectomies.  Genetic testing was negative for deleterious mutations.  Port-A-Cath was inserted.  She received most of her chemotherapy but not all due to severe side effects.  MRI showed good response.     On Nov 06, 2018 she underwent removal of Port-A-Cath, bilateral total mastectomy with skin reduction pattern, right axillary deep sentinel node biopsy.  Final pathology showed complete pathologic response on the right, no cancer in her lymph nodes, and no cancer in the prophylactic left mastectomy.  Postop course was complicated by intense neuropathic pain, probably neuritis of the intercostal brachial cutaneous nerve. As an outpatient, she saw Dr. Suella Broad the physiatrist at [facility],  and he begin injection therapy with D5 water to the right  arm about 1 week prior to this admission.     .  She presented with fever to 102 swelling and tenderness in the right lateral chest wall and was admitted by Dr. Dema Severin.          Hospital Course: She was started on broad-spectrum antibiotics.  Imaging studies showed cellulitis of the right lateral chest wall which was consistent with physical findings.  There was no well-defined abscess.  Interventional radiology performed needle aspiration but only got about 10 cc of fluid.  We initially thought that we would treat this as a cellulitis and the fevers initially went down but then her pain increased and the fever went back to 102.        She was taken to the operating room on Nov 28, 2018 and I opened the lateral transverse incision and part of the vertical incision.  In the plane between the deep subcutaneous tissue  and the pectoralis muscle in the axilla there was some purulent fluid which was cultured but did not have any odor.  This was washed out.  There was no tissue that required debridement.  The wound was packed.  She was returned to the operating room the next day for dressing change and the wound looked fairly clean.     Throughout her hospital course she was followed by the infectious disease service.  Antibiotics were narrowed to vancomycin when it looked like it was a Staphylococcus infection.  She was transitioned to oral Bactrim about 2 days prior to discharge.  Her wound infection was thought to be MSSA.  Her screen for MRSA was positive and she was started on Bactroban nasal ointment.  Following the surgeries her fevers resolved and her white count was normal.    We begin changing the dressings on postop day 2 at the bedside and with premedication with Dilaudid that went well.  Her biggest problem is intermittent right chest wall pain, anxiety and tearfulness.  She says that the right arm neuropathic pain is settling down.  She is still on gabapentin.      She thinks that she can change the  dressings at home.  She has a friend that is an Therapist, sports and her mother is willing to learn.  We have arranged for home health RN in [place] to help with this.  The wound care will be saline and Kerlix, moist to dry loose packing twice daily.    Medications at discharge include Bactrim DS 1 twice daily x2 weeks.  She is on Neurontin 900 mg every 6 hours.  We have called in a prescription for Toradol 10 mg every 8 hours but she may not use that.  She has Bactroban ointment to use twice daily.  She has OxyIR 5 mg at home.  We called in a prescription for Robaxin but she may not fill that.  She has tramadol at home.  She has multivitamins.  She has Senokot.  I called in a prescription for Zofran at her request.     She was seen in consultation by the wound and ostomy nurse who felt that twice daily dressing changes was the best approach and she is not a candidate for an negative pressure dressing at this time.     She has problems with anorexia and was seen by nutritional management with strategies for multiple meals per day to improve caloric intake.      I changed her bandage the morning of discharge and it was clean and without odor or purulence or necrosis.  The wound is getting smaller.  She is still tender.     She knows that there is superimposed anxiety and she recognizes that.  I told her to see her primary care provider to see if it would be appropriate to try an antianxiety medication.  I told her I did not feel comfortable prescribing such medications.     She is going to make a follow-up appointment with Dr. Herma Mering and a follow-up appointment with Dr. Lindi Adie  Consults: Infectious disease, interventional radiology, wound and ostomy nurse, nutritional management, case management  Significant Diagnostic Studies: Lab work and cultures  Treatments: surgery: Right mastectomy wound exploration (11/28/2018), dressing change in OR (11/29/2018)  Disposition: Home  Patient Instructions:  Allergies as  of 12/02/2018      Reactions   Bee Venom Anaphylaxis   Dihydroergotamine Anaphylaxis   Imitrex [sumatriptan] Other (See Comments)  SEIZURE-LIKE ACTIVITY   Metoclopramide Other (See Comments)   TACHYCARDIA   Transderm-scop [scopolamine] Other (See Comments)   AGITATION      Medication List    TAKE these medications   gabapentin 600 MG tablet Commonly known as:  NEURONTIN Take 900 mg by mouth 4 (four) times daily. What changed:  Another medication with the same name was removed. Continue taking this medication, and follow the directions you see here.   methocarbamol 500 MG tablet Commonly known as:  ROBAXIN Take 1 tablet (500 mg total) by mouth every 6 (six) hours as needed for muscle spasms.   multivitamin with minerals Tabs tablet Take 1 tablet by mouth daily.   mupirocin ointment 2 % Commonly known as:  BACTROBAN Place into the nose 2 (two) times daily.   ondansetron 4 MG disintegrating tablet Commonly known as:  ZOFRAN-ODT Take 1 tablet (4 mg total) by mouth every 6 (six) hours as needed for nausea. What changed:  Another medication with the same name was added. Make sure you understand how and when to take each.   ondansetron 4 MG disintegrating tablet Commonly known as:  ZOFRAN-ODT Take 1 tablet (4 mg total) by mouth every 6 (six) hours as needed for nausea. What changed:  You were already taking a medication with the same name, and this prescription was added. Make sure you understand how and when to take each.   oxyCODONE 5 MG immediate release tablet Commonly known as:  Oxy IR/ROXICODONE Take 1 tablet (5 mg total) by mouth every 4 (four) hours as needed for moderate pain.   senna-docusate 8.6-50 MG tablet Commonly known as:  Senokot-S Take 1 tablet by mouth 2 (two) times daily.   sulfamethoxazole-trimethoprim 800-160 MG tablet Commonly known as:  BACTRIM DS Take 1 tablet by mouth every 12 (twelve) hours.   traMADol 50 MG tablet Commonly known as:   ULTRAM Take 1-2 tablets (50-100 mg total) by mouth every 6 (six) hours as needed for moderate pain.            Discharge Care Instructions  (From admission, onward)         Start     Ordered   12/02/18 0000  Discharge wound care:    Comments:  As instructed   12/02/18 0539          Activity: Ambulate frequently.  Moves shoulder around frequently.  No driving for now Diet: regular diet Wound Care: As directed.  Saline and kerlex moist to dry packing twice a day.  Use minimal tape  Follow-up:  With Dr. Dalbert Batman in 1 week    Addendum: I logged onto the PMP aware website and reviewed her prescription medication history  .  Signed: Edsel Petrin. Dalbert Batman, M.D., FACS General and minimally invasive surgery Breast and Colorectal Surgery  12/02/2018, 7:32 AM

## 2018-12-02 NOTE — Progress Notes (Signed)
Patient discharged to home. Verbalizes understanding of all discharge instructions including incision care, discharge medications, and follow up MD visits. Patient set up with home health. Sent supplied with patient for right breast dressing changes BID. Patient transported home by friend.

## 2018-12-02 NOTE — TOC Initial Note (Addendum)
Transition of Care [facility]) - Initial/Assessment Note    Patient Details  Name: Holly Wiley MRN: [id number] Date of Birth: 1976-04-23  Transition of [facility]) CM/SW Contact:    Carles Collet, RN Phone Number: 12/02/2018, 10:17 AM  Clinical Narrative:                  Spoke to patient at bedside, she states that she will be staying with her mom at [place] at [place]. She states that she will have help at [place] to change her BID dressings. Clarified with her that the [facility] will have 48 hours to see her once she gets home. Attempted referral to [facility] and [facility] who could not take, waiting to hear back from [facility].  [place] accepted patient. Faxed orders to [phone]        Barriers to Discharge: No Barriers Identified   Patient Goals and CMS Choice Patient states their goals for this hospitalization and ongoing recovery are:: to return home CMS Medicare.gov Compare Post Acute Care list provided to:: Patient Choice offered to / list presented to : Patient  Expected Discharge Plan and Services           Expected Discharge Date: 12/02/18                         HH Arranged: RN Nathalie Agency: [place] Date [place]: 12/02/18 Time [place]: 51 Representative spoke with at [place]: [place]  Prior Living Arrangements/Services                       Activities of Daily Living Home Assistive Devices/Equipment: None ADL Screening (condition at time of admission) Patient's cognitive ability adequate to safely complete daily activities?: Yes Is the patient deaf or have difficulty hearing?: No Does the patient have difficulty seeing, even when wearing glasses/contacts?: No Does the patient have difficulty concentrating, remembering, or making decisions?: No Patient able to express need for assistance with ADLs?: Yes Does the patient have difficulty dressing or bathing?:  No Independently performs ADLs?: Yes (appropriate for developmental age) Does the patient have difficulty walking or climbing stairs?: No Weakness of Legs: None Weakness of Arms/Hands: None  Permission Sought/Granted                  Emotional Assessment              Admission diagnosis:  fever Patient Active Problem List   Diagnosis Date Noted  . Wound infection after surgery 11/28/2018  . Breast cancer ([place]) 11/26/2018  . Postoperative wound infection 11/26/2018  . Breast cancer of upper-outer quadrant of right female breast ([place]) 11/06/2018  . Genetic testing 08/20/2018  . 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 Dehydration 07/24/2018  . Nausea and vomiting 07/24/2018  . Nausea without vomiting 07/24/2018  . Family history of breast cancer   . Family history of colon cancer   . Family history of bladder cancer   . Family history of pancreatic cancer   . Port-A-Cath in place 07/16/2018  . Malignant neoplasm of upper-outer quadrant of right breast in female, estrogen receptor negative ([place]) 06/29/2018  . 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 Macromastia 11/19/2013  . Breast mass, right 10/20/2013  . Abnormal Pap smear 01/01/2012   PCP:  [name], NP Pharmacy:   [facility]  [place] [address] Phone: [phone] Fax: [place], [place] [address] [place] [address] Phone: [phone] Fax: [phone]     Social Determinants of Health (SDOH) Interventions    Readmission Risk Interventions No flowsheet data found.

## 2018-12-03 LAB — AEROBIC/ANAEROBIC CULTURE W GRAM STAIN (SURGICAL/DEEP WOUND): Gram Stain: NONE SEEN

## 2018-12-14 ENCOUNTER — Ambulatory Visit: Payer: BC Managed Care – PPO | Attending: General Surgery | Admitting: Physical Therapy

## 2018-12-14 ENCOUNTER — Other Ambulatory Visit: Payer: Self-pay

## 2018-12-14 ENCOUNTER — Encounter: Payer: Self-pay | Admitting: Physical Therapy

## 2018-12-14 DIAGNOSIS — Z483 Aftercare following surgery for neoplasm: Secondary | ICD-10-CM | POA: Diagnosis present

## 2018-12-14 DIAGNOSIS — M79621 Pain in right upper arm: Secondary | ICD-10-CM | POA: Diagnosis present

## 2018-12-14 DIAGNOSIS — M25611 Stiffness of right shoulder, not elsewhere classified: Secondary | ICD-10-CM

## 2018-12-14 DIAGNOSIS — R293 Abnormal posture: Secondary | ICD-10-CM

## 2018-12-14 NOTE — Therapy (Addendum)
[place], [place], [address] Phone: [phone]   Fax:  [phone]  Physical Therapy Evaluation  Patient Details  Name: Holly Wiley MRN: [id number] Date of Birth: February 13, 1976 Referring Provider (PT): Dr. Fanny Skates   Encounter Date: 12/14/2018  PT End of Session - 12/14/18 1146    Visit Number  1    Number of Visits  9    Date for PT Re-Evaluation  02/08/19    PT Start Time  1058    PT Stop Time  1146    PT Time Calculation (min)  48 min    Activity Tolerance  Patient tolerated treatment well    Behavior During Therapy  [facility] for tasks assessed/performed       Past Medical History:  Diagnosis Date  . Breast cancer ([place]) 07/2018   right  . Dental crown present   . Family history of bladder cancer   . Family history of breast cancer   . Family history of colon cancer   . Family history of pancreatic cancer   . 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  . Migraines   . PONV (postoperative nausea and vomiting)     Past Surgical History:  Procedure Laterality Date  . BILATERAL TOTAL MASTECTOMY WITH AXILLARY LYMPH NODE DISSECTION N/A 11/06/2018   Procedure: BILATERAL TOTAL MASTECTOMY WITH AXILLARY LYMPH NODE DISSECTION;  Surgeon: Fanny Skates, MD;  Location: [place];  Service: General;  Laterality: N/A;  . BREAST BIOPSY Right 11/08/2013   Procedure: EXCISION OF RIGHT  BREAST MASS;  Surgeon: Adin Hector, MD;  Location: [facility];  Service: General;  Laterality: Right;  . BUNIONECTOMY Left   . CARPAL TUNNEL RELEASE Right 02/26/2017  . CERVICAL CONE BIOPSY  05/2005  . COLONOSCOPY  2014  . DRESSING CHANGE UNDER ANESTHESIA Right 11/29/2018   Procedure: DRESSING CHANGE UNDER ANESTHESIA;  Surgeon: Fanny Skates, MD;  Location: [facility];  Service: General;  Laterality: Right;  . IRRIGATION AND DEBRIDEMENT ABSCESS Right 11/28/2018   Procedure:  Exploration and Drainage of Right Mastectomy wound.;  Surgeon: Fanny Skates, MD;  Location: [facility];  Service: General;  Laterality: Right;  . PORT-A-CATH REMOVAL N/A 11/06/2018   Procedure: Removal Port-A-Cath;  Surgeon: Fanny Skates, MD;  Location: [facility];  Service: General;  Laterality: N/A;  . PORTACATH PLACEMENT Right 07/08/2018   Procedure: INSERTION PORT-A-CATH;  Surgeon: Fanny Skates, MD;  Location: [facility];  Service: General;  Laterality: Right;  . SHOULDER ARTHROSCOPY W/ LABRAL REPAIR Right   . SHOULDER ARTHROSCOPY W/ ROTATOR CUFF REPAIR Left   . TENOTOMY FOREARM / WRIST Right 02/26/2017    There were no vitals filed for this visit.   Subjective Assessment - 12/14/18 1101    Subjective  Patient reports she is here today to learn what her arm limitations are, what she can do, and what she needs to do get her arm back to normal. She underwent neoadjuvant chemotherapy 07/09/2018-10/01/2018 for triple negative right breast cancer. She then had a bilateral mastectomy and right sentinel node biopsy (0/3 nodes negative) on 11/06/2018. She then had to undergo wound debridement due to a right mastectomy incision infection on 11/28/2018.    Pertinent History  Neoadjuvant chemo for right breast cancer 07/09/2018-10/01/2018. Bilateral mastectomy 11/06/2018 with right sentinel node biopsy.    Patient Stated Goals  Decrease arm pain and restore shoulder ROM    Currently in Pain?  Yes  Pain Score  1    Worse with activity and fabric touching skin   Pain Location  Arm    Pain Orientation  Right;Posterior;Upper    Pain Descriptors / Indicators  Numbness;Other (Comment)   Hypersensitivity   Pain Type  Neuropathic pain    Pain Radiating Towards  right elbow    Pain Onset  More than a month ago    Pain Frequency  Constant    Aggravating Factors   Fabric brushing against skin    Pain Relieving Factors  Gabapentin         [facility] PT Assessment - 12/14/18 0001      Assessment   Medical  Diagnosis  s/p bil mastectomy with Rt breast cancer, rt SNLB    Referring Provider (PT)  Dr. Fanny Skates    Onset Date/Surgical Date  11/06/18    Hand Dominance  Right    Next MD Visit  12/21/2018    Prior Therapy  None      Precautions   Precautions  Other (comment)    Precaution Comments  recent infection; right arm lymphedema risk      Restrictions   Weight Bearing Restrictions  No      Balance Screen   Has the patient fallen in the past 6 months  Yes    How many times?  [place] while hospitalized   Has the patient had a decrease in activity level because of a fear of falling?   No    Is the patient reluctant to leave their home because of a fear of falling?   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 Viewed picture on patient's phone to see wound. It appears very large with several tunnels.    Skin Integrity  Large wound right chest      Posture/Postural Control   Posture/Postural Control  Postural limitations    Postural Limitations  Rounded Shoulders;Forward head      ROM / Strength   AROM / PROM / Strength  AROM;Strength      AROM   AROM Assessment Site  Shoulder;Cervical    Right/Left Shoulder  Right;Left    Right Shoulder Extension  47 Degrees    Right Shoulder Flexion  111 Degrees    Right Shoulder ABduction  100 Degrees    Right Shoulder Internal Rotation  71 Degrees    Right Shoulder  External Rotation  87 Degrees    Left Shoulder Extension  53 Degrees    Left Shoulder Flexion  145 Degrees    Left Shoulder ABduction  155 Degrees    Left Shoulder Internal Rotation  62 Degrees    Left Shoulder External Rotation  80 Degrees    Cervical Flexion  WNL  Cervical Extension  WNL    Cervical - Right Side Bend  WNL    Cervical - Left Side Bend  WNL    Cervical - Right Rotation  WNL    Cervical - Left Rotation  WNL      Special Tests   Other special tests  Positive right upper limb tension test        LYMPHEDEMA/ONCOLOGY QUESTIONNAIRE - 12/14/18 1115      Type   Cancer Type  Right breast cancer      Surgeries   Mastectomy Date  11/06/18    Sentinel Lymph Node Biopsy Date  11/06/18    Number Lymph Nodes Removed  3      Treatment   Active Chemotherapy Treatment  No    Past Chemotherapy Treatment  Yes    Date  10/01/18    Active Radiation Treatment  No    Past Radiation Treatment  No    Current Hormone Treatment  No    Past Hormone Therapy  No      What other symptoms do you have   Are you Having Heaviness or Tightness  No    Are you having Pain  Yes    Are you having pitting edema  No    Is it Hard or Difficult finding clothes that fit  No    Do you have infections  Yes    Comments  Hospitalized 11/28/2018-12/02/2018    Is there Decreased scar mobility  No    Stemmer Sign  No      Lymphedema Assessments   Lymphedema Assessments  Upper extremities      Right Upper Extremity Lymphedema   15 cm Proximal to Olecranon Process  35.3 cm    10 cm Proximal to Olecranon Process  31.6 cm    Olecranon Process  26.6 cm    15 cm Proximal to Ulnar Styloid Process  25.5 cm    10 cm Proximal to Ulnar Styloid Process  23 cm    Just Proximal to Ulnar Styloid Process  16 cm    Across Hand at [employer]  19.6 cm    At [employer] of 2nd Digit  6.2 cm      Left Upper Extremity Lymphedema   15 cm Proximal to Olecranon Process  34.5 cm    10 cm Proximal to Olecranon Process   31.5 cm    Olecranon Process  26.3 cm    15 cm Proximal to Ulnar Styloid Process  25.8 cm    10 cm Proximal to Ulnar Styloid Process  23 cm    Just Proximal to Ulnar Styloid Process  16.3 cm    Across Hand at [employer]  19 cm    At [place] of 2nd Digit  6.1 cm          Quick Dash - 12/14/18 0001    Open a tight or new jar  Moderate difficulty    Do heavy household chores (wash walls, wash floors)  Moderate difficulty    Carry a shopping bag or briefcase  Moderate difficulty    Wash your back  Severe difficulty    Use a knife to cut food  Mild difficulty    Recreational activities in which you take some force or impact through your arm, shoulder, or hand (golf, hammering, tennis)  Unable    During the past week, to what extent has your arm, shoulder or hand problem interfered with your normal social  activities with family, friends, neighbors, or groups?  Quite a bit    During the past week, to what extent has your arm, shoulder or hand problem limited your work or other regular daily activities  Modererately    Arm, shoulder, or hand pain.  Severe    Tingling (pins and needles) in your arm, shoulder, or hand  Extreme    Difficulty Sleeping  Moderate difficulty    DASH Score  63.64 %        Objective measurements completed on examination: See above findings.              PT Education - 12/14/18 1145    Education Details  Shoulder/scapular gentle ROM and nerve HEP (postural)    Person(s) Educated  Patient    Methods  Explanation;Demonstration;Handout    Comprehension  Verbalized understanding;Returned demonstration       PT Short Term Goals - 12/14/18 1158      PT SHORT TERM GOAL #1   Title  Patient will be independent with initial home exercise program.    Time  4    Period  Weeks    Status  New    Target Date  01/11/19      PT SHORT TERM GOAL #2   Title  Patient will increase right shoulder active flexion to >/= 120 degrees for increased ease reaching  overhead.    Time  4    Period  Weeks    Status  New    Target Date  01/11/19      PT SHORT TERM GOAL #3   Title  Patient will increase right shoulder active abduction to >/= 120 degrees for increased ease reaching overhead.    Time  4    Period  Weeks    Status  New      PT SHORT TERM GOAL #4   Title  Patient will verbalize good understanding of lymphedema risk reduction practices.    Time  4    Period  Weeks    Status  New    Target Date  01/11/19        PT Long Term Goals - 12/14/18 1155      PT LONG TERM GOAL #1   Title  Patient will be able to demonstrate independence with her home exercise program for shoulder ROM.    Time  8    Period  Weeks    Status  New    Target Date  02/08/19      PT LONG TERM GOAL #2   Title  Patient will increase right shoulder active flexion to >/= 140 degrees for increased ease reaching overhead.    Time  8    Period  Weeks    Status  New    Target Date  02/08/19      PT LONG TERM GOAL #3   Title  Patient will increase right shoulder active abduction to >/= 140 degrees for increased ease reaching overhead.    Time  8    Period  Weeks    Status  New    Target Date  02/08/19      PT LONG TERM GOAL #4   Title  Patient will verbalize pain has reduced by >/= 40% since eval to tolerate daily tasks with greater ease.    Time  8    Period  Weeks    Status  New      PT LONG TERM GOAL #5  Title  Improve DASH score to </= 20 for imporved overall shoulder function.    Time  8    Period  Weeks    Status  New    Target Date  02/08/19             Plan - 12/14/18 1146    Clinical Impression Statement  Patient is 2 weeks s/p hospitalization for wound debridement due to a post mastectomy infection. She had neoadjuvant chemotherapy 07/09/2018-10/01/2018 followed by bilateral mastectomies and right sentinel node biopsy on 11/06/2018. Her right chest wound is very large and she has a friend who is a wound care nurse that is helping her care  for it at home. She has limited right shoulder ROM and neural tension which is to be expected. She was issued a HEP today that would not stretch her wound site and we discussed various options but decided to hold off on PT for 3 weeks to allow for more wound healing. She knows to call us if it heals faster and she feels she can begin PT sooner.She will benefit from PT to improve shoulder ROM, decrease nerve and wound pain, and improve posture and strength.    Personal Factors and Comorbidities  Comorbidity 1    Comorbidities  Recent infection    Stability/Clinical Decision Making  Stable/Uncomplicated    Clinical Decision Making  Low    Rehab Potential  Good   Large wound pressent   PT Frequency  2x / week    PT Duration  8 weeks   Beginning 01/04/2019   PT Treatment/Interventions  ADLs/Self Care Home Management;Therapeutic activities;Therapeutic exercise;Manual lymph drainage;Manual techniques;Patient/family education;Scar mobilization;Passive range of motion    PT Next Visit Plan  Assess wound; begin PROM right shoulder, pulleys, ball up wall, etc    PT Home Exercise Plan  Backward shoulder rolls, scapular retraction, median nerve stretch    Consulted and Agree with Plan of Care  Patient       Patient will benefit from skilled therapeutic intervention in order to improve the following deficits and impairments:  Decreased skin integrity, Pain, Impaired UE functional use, Increased fascial restricitons, Decreased strength, Decreased knowledge of precautions, Decreased scar mobility, Decreased range of motion, Postural dysfunction  Visit Diagnosis: Aftercare following surgery for neoplasm - Plan: PT plan of care cert/re-cert  Stiffness of right shoulder, not elsewhere classified - Plan: PT plan of care cert/re-cert  Pain in right upper arm - Plan: PT plan of care cert/re-cert  Abnormal posture - Plan: PT plan of care cert/re-cert     Problem List Patient Active Problem List   Diagnosis  Date Noted  . Wound infection after surgery 11/28/2018  . Breast cancer ([place]) 11/26/2018  . Postoperative wound infection 11/26/2018  . Breast cancer of upper-outer quadrant of right female breast ([place]) 11/06/2018  . Genetic testing 08/20/2018  . 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 Dehydration 07/24/2018  . Nausea and vomiting 07/24/2018  . Nausea without vomiting 07/24/2018  . Family history of breast cancer   . Family history of colon cancer   . Family history of bladder cancer   . Family history of pancreatic cancer   . Port-A-Cath in place 07/16/2018  . Malignant neoplasm of upper-outer quadrant of right breast in female, estrogen receptor negative ([place]) 06/29/2018  . 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 Macromastia 11/19/2013  . Breast mass, right 10/20/2013  . Abnormal Pap smear 01/01/2012   Annia Friendly, PT  12/14/18 12:03 PM  [place], [place], [address] Phone: [phone]   Fax:  [phone]  Name: Holly Wiley MRN: [id number] Date of Birth: 1976/02/06

## 2018-12-14 NOTE — Patient Instructions (Addendum)
Scapular Retraction (Standing)    With arms at sides, pinch shoulder blades together. Repeat __10__ times per set. Do __1__ sets per session. Do __2__ sessions per day.  http://orth.exer.us/945   Copyright  VHI. All rights reserved.  Shoulder Roll    Move shoulders forward, up, back, then down. Continue circling shoulders backward _10__ times. Repeat, circling shoulders forward. Do _2__ times per day.  Copyright  VHI. All rights reserved.  MEDIAN NERVE: Mobilization XI    Stand with right palm flat on wall, fingers back, elbow bent, head tilted away. Sidestep away from wall, straightening elbow. Do _1__ sets of _5__ repetitions per session. Do __2_ sessions per day.  Copyright  VHI. All rights reserved.

## 2018-12-22 ENCOUNTER — Ambulatory Visit (HOSPITAL_COMMUNITY): Payer: BLUE CROSS/BLUE SHIELD

## 2019-01-04 ENCOUNTER — Encounter: Payer: Self-pay | Admitting: Physical Therapy

## 2019-01-07 ENCOUNTER — Encounter: Payer: Self-pay | Admitting: Physical Therapy

## 2019-01-08 ENCOUNTER — Ambulatory Visit: Payer: BC Managed Care – PPO | Attending: General Surgery

## 2019-01-08 ENCOUNTER — Other Ambulatory Visit: Payer: Self-pay

## 2019-01-08 DIAGNOSIS — Z483 Aftercare following surgery for neoplasm: Secondary | ICD-10-CM | POA: Diagnosis present

## 2019-01-08 DIAGNOSIS — M25611 Stiffness of right shoulder, not elsewhere classified: Secondary | ICD-10-CM | POA: Insufficient documentation

## 2019-01-08 DIAGNOSIS — R293 Abnormal posture: Secondary | ICD-10-CM | POA: Diagnosis present

## 2019-01-08 DIAGNOSIS — M79621 Pain in right upper arm: Secondary | ICD-10-CM | POA: Diagnosis present

## 2019-01-08 NOTE — Therapy (Signed)
[place], [place], [address] Phone: [phone]   Fax:  [phone]  Physical Therapy Treatment  Patient Details  Name: Holly Wiley MRN: [id number] Date of Birth: [birth year] Referring Provider (PT): Dr. Fanny Skates   Encounter Date: 01/08/2019  PT End of Session - 01/08/19 0848    Visit Number  2    Number of Visits  9    Date for PT Re-Evaluation  02/08/19    PT Start Time  0802    PT Stop Time  0859    PT Time Calculation (min)  57 min    Activity Tolerance  Patient tolerated treatment well    Behavior During Therapy  [facility] for tasks assessed/performed       Past Medical History:  Diagnosis Date  . Breast cancer ([place]) 07/2018   right  . Dental crown present   . Family history of bladder cancer   . Family history of breast cancer   . Family history of colon cancer   . Family history of pancreatic cancer   . 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  . Migraines   . PONV (postoperative nausea and vomiting)     Past Surgical History:  Procedure Laterality Date  . BILATERAL TOTAL MASTECTOMY WITH AXILLARY LYMPH NODE DISSECTION N/A 11/06/2018   Procedure: BILATERAL TOTAL MASTECTOMY WITH AXILLARY LYMPH NODE DISSECTION;  Surgeon: Fanny Skates, MD;  Location: [place];  Service: General;  Laterality: N/A;  . BREAST BIOPSY Right 11/08/2013   Procedure: EXCISION OF RIGHT  BREAST MASS;  Surgeon: Adin Hector, MD;  Location: [place];  Service: General;  Laterality: Right;  . BUNIONECTOMY Left   . CARPAL TUNNEL RELEASE Right 02/26/2017  . CERVICAL CONE BIOPSY  05/2005  . COLONOSCOPY  2014  . DRESSING CHANGE UNDER ANESTHESIA Right 11/29/2018   Procedure: DRESSING CHANGE UNDER ANESTHESIA;  Surgeon: Fanny Skates, MD;  Location: [place];  Service: General;  Laterality: Right;  . IRRIGATION AND DEBRIDEMENT ABSCESS Right 11/28/2018   Procedure:  Exploration and Drainage of Right Mastectomy wound.;  Surgeon: Fanny Skates, MD;  Location: [place];  Service: General;  Laterality: Right;  . PORT-A-CATH REMOVAL N/A 11/06/2018   Procedure: Removal Port-A-Cath;  Surgeon: Fanny Skates, MD;  Location: [place];  Service: General;  Laterality: N/A;  . PORTACATH PLACEMENT Right 07/08/2018   Procedure: INSERTION PORT-A-CATH;  Surgeon: Fanny Skates, MD;  Location: [place];  Service: General;  Laterality: Right;  . SHOULDER ARTHROSCOPY W/ LABRAL REPAIR Right   . SHOULDER ARTHROSCOPY W/ ROTATOR CUFF REPAIR Left   . TENOTOMY FOREARM / WRIST Right 02/26/2017    There were no vitals filed for this visit.  Subjective Assessment - 01/08/19 0809    Subjective  I am doing much better since I was here last. My wound isn't fully closed but so much better with minimal to no tunneling of wound now. The past few days I've been able to pressure wash outside, the exercises are finally easy that [name] gave me. I can reach to the higher shelves now in my kitchen and can dust. Just able to do alot more, especially this past week I have just felt better about doing it. Also able to do my own wound care now.    Pertinent History  Neoadjuvant chemo for right breast cancer 07/09/2018-10/01/2018. Bilateral mastectomy 11/06/2018 with right sentinel node biopsy.    Patient Stated Goals  Decrease arm pain and restore shoulder ROM    Currently in Pain?  No/denies   having nerve sensitivity at Rt upper arm intermittently        [facility] PT Assessment - 01/08/19 0001      AROM   Right Shoulder Flexion  149 Degrees    Right Shoulder ABduction  129 Degrees                   [facility] Adult PT Treatment/Exercise - 01/08/19 0001      Shoulder Exercises: Pulleys   Flexion  2 minutes    Flexion Limitations  Pt returned therapist demo; pt with near full ROM but good stetch reported in axilla    ABduction  2 minutes    ABduction Limitations  To tolerance  returning therapist demo and VCs to remind no pull into the incision, tactile cuing to decrease Rt scapular compensation, this challenging for pt      Manual Therapy   Manual Therapy  Myofascial release;Passive ROM;Neural Stretch    Myofascial Release  Rt UE pulling throughout P/ROM and gently to axilla without stretch felt to healing incision    Passive ROM  To Rt shoulder into flexion, abduction and D2 to pts tolerance    Neural Stretch  To Rt UE, this was much better from first visit               PT Short Term Goals - 01/08/19 1214      PT SHORT TERM GOAL #1   Title  Patient will be independent with initial home exercise program.    Baseline  Pt independent with streches thus far and has been incorporating other stetches into ADLs-01/08/19    Status  Achieved      PT SHORT TERM GOAL #2   Title  Patient will increase right shoulder active flexion to >/= 120 degrees for increased ease reaching overhead.    Baseline  149 degrees-01/08/19    Status  Achieved      PT SHORT TERM GOAL #3   Title  Patient will increase right shoulder active abduction to >/= 120 degrees for increased ease reaching overhead.    Baseline  129 degrees-01/08/19    Status  Achieved      PT SHORT TERM GOAL #4   Title  Patient will verbalize good understanding of lymphedema risk reduction practices.    Status  On-going        PT Long Term Goals - 12/14/18 1155      PT LONG TERM GOAL #1   Title  Patient will be able to demonstrate independence with her home exercise program for shoulder ROM.    Time  8    Period  Weeks    Status  New    Target Date  02/08/19      PT LONG TERM GOAL #2   Title  Patient will increase right shoulder active flexion to >/= 140 degrees for increased ease reaching overhead.    Time  8    Period  Weeks    Status  New    Target Date  02/08/19      PT LONG TERM GOAL #3   Title  Patient will increase right shoulder active abduction to >/= 140 degrees for increased ease  reaching overhead.    Time  8    Period  Weeks    Status  New    Target Date  02/08/19      PT  LONG TERM GOAL #4   Title  Patient will verbalize pain has reduced by >/= 40% since eval to tolerate daily tasks with greater ease.    Time  8    Period  Weeks    Status  New      PT LONG TERM GOAL #5   Title  Improve DASH score to </= 20 for imporved overall shoulder function.    Time  8    Period  Weeks    Status  New    Target Date  02/08/19            Plan - 01/08/19 0902    Clinical Impression Statement  Pt returns after 3 weeks from evaluation as planned to allow wound further time to heal. It appears much improved today, though therapist did not see for herself as pt is still bandaged with wound dressings. She had picture which shows much improvement and reports minimal to no tunneling anymore. So was able to begin P/ROM to Rt shoulder and AA/ROM with no c/o pulling at incision and pt reported shoulder feeling looser by end of session.  Instructed pt ok to incorporate stretches into ADLs as long as no pull felt at incision and pt verbalized understanding.    Personal Factors and Comorbidities  Comorbidity 1    Comorbidities  Recent infection    Stability/Clinical Decision Making  Stable/Uncomplicated    Rehab Potential  Good   large, but healing, wound present   PT Frequency  2x / week    PT Duration  8 weeks   began 01/04/19   PT Treatment/Interventions  ADLs/Self Care Home Management;Therapeutic activities;Therapeutic exercise;Manual lymph drainage;Manual techniques;Patient/family education;Scar mobilization;Passive range of motion    PT Next Visit Plan  Cont to assess wound; cont PROM to right shoulder, pulleys, ball up wall, etc    PT Home Exercise Plan  Backward shoulder rolls, scapular retraction, median nerve stretch    Consulted and Agree with Plan of Care  Patient       Patient will benefit from skilled therapeutic intervention in order to improve the following  deficits and impairments:  Decreased skin integrity, Pain, Impaired UE functional use, Increased fascial restricitons, Decreased strength, Decreased knowledge of precautions, Decreased scar mobility, Decreased range of motion, Postural dysfunction  Visit Diagnosis: 1. Aftercare following surgery for neoplasm   2. Stiffness of right shoulder, not elsewhere classified   3. Pain in right upper arm   4. Abnormal posture        Problem List Patient Active Problem List   Diagnosis Date Noted  . Wound infection after surgery 11/28/2018  . Breast cancer ([place]) 11/26/2018  . Postoperative wound infection 11/26/2018  . Breast cancer of upper-outer quadrant of right female breast ([place]) 11/06/2018  . Genetic testing 08/20/2018  . 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 Dehydration 07/24/2018  . Nausea and vomiting 07/24/2018  . Nausea without vomiting 07/24/2018  . Family history of breast cancer   . Family history of colon cancer   . Family history of bladder cancer   . Family history of pancreatic cancer   . Port-A-Cath in place 07/16/2018  . Malignant neoplasm of upper-outer quadrant of right breast in female, estrogen receptor negative ([place]) 06/29/2018  . 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 Macromastia 11/19/2013  . Breast mass, right 10/20/2013  . Abnormal Pap smear 01/01/2012    Otelia Limes, PTA 01/08/2019, 12:16 PM  [facility]  [place], [place], [address] Phone: [phone]   Fax:  [phone]  Name: Holly Wiley MRN: [id number] Date of Birth: 1976-05-03

## 2019-01-11 ENCOUNTER — Ambulatory Visit: Payer: BC Managed Care – PPO | Admitting: Physical Therapy

## 2019-01-11 ENCOUNTER — Encounter: Payer: Self-pay | Admitting: Physical Therapy

## 2019-01-11 ENCOUNTER — Other Ambulatory Visit: Payer: Self-pay

## 2019-01-11 DIAGNOSIS — Z483 Aftercare following surgery for neoplasm: Secondary | ICD-10-CM | POA: Diagnosis not present

## 2019-01-11 DIAGNOSIS — M79621 Pain in right upper arm: Secondary | ICD-10-CM

## 2019-01-11 DIAGNOSIS — M25611 Stiffness of right shoulder, not elsewhere classified: Secondary | ICD-10-CM

## 2019-01-11 DIAGNOSIS — R293 Abnormal posture: Secondary | ICD-10-CM

## 2019-01-11 NOTE — Patient Instructions (Signed)

## 2019-01-11 NOTE — Therapy (Signed)
[place], [place], [address] Phone: [phone]   Fax:  [phone]  Physical Therapy Treatment  Patient Details  Name: Holly Wiley MRN: [id number] Date of Birth: 1976/02/16 Referring Provider (PT): Dr. Fanny Skates   Encounter Date: 01/11/2019  PT End of Session - 01/11/19 1322    Visit Number  3    Number of Visits  9    Date for PT Re-Evaluation  02/08/19    PT Start Time  1227    PT Stop Time  1315    PT Time Calculation (min)  48 min    Activity Tolerance  Patient tolerated treatment well    Behavior During Therapy  [facility] for tasks assessed/performed       Past Medical History:  Diagnosis Date  . Breast cancer ([place]) 07/2018   right  . Dental crown present   . Family history of bladder cancer   . Family history of breast cancer   . Family history of colon cancer   . Family history of pancreatic cancer   . 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  . Migraines   . PONV (postoperative nausea and vomiting)     Past Surgical History:  Procedure Laterality Date  . BILATERAL TOTAL MASTECTOMY WITH AXILLARY LYMPH NODE DISSECTION N/A 11/06/2018   Procedure: BILATERAL TOTAL MASTECTOMY WITH AXILLARY LYMPH NODE DISSECTION;  Surgeon: Fanny Skates, MD;  Location: [place];  Service: General;  Laterality: N/A;  . BREAST BIOPSY Right 11/08/2013   Procedure: EXCISION OF RIGHT  BREAST MASS;  Surgeon: Adin Hector, MD;  Location: [place];  Service: General;  Laterality: Right;  . BUNIONECTOMY Left   . CARPAL TUNNEL RELEASE Right 02/26/2017  . CERVICAL CONE BIOPSY  05/2005  . COLONOSCOPY  2014  . DRESSING CHANGE UNDER ANESTHESIA Right 11/29/2018   Procedure: DRESSING CHANGE UNDER ANESTHESIA;  Surgeon: Fanny Skates, MD;  Location: [place];  Service: General;  Laterality: Right;  . IRRIGATION AND DEBRIDEMENT ABSCESS Right 11/28/2018   Procedure:  Exploration and Drainage of Right Mastectomy wound.;  Surgeon: Fanny Skates, MD;  Location: [place];  Service: General;  Laterality: Right;  . PORT-A-CATH REMOVAL N/A 11/06/2018   Procedure: Removal Port-A-Cath;  Surgeon: Fanny Skates, MD;  Location: [place];  Service: General;  Laterality: N/A;  . PORTACATH PLACEMENT Right 07/08/2018   Procedure: INSERTION PORT-A-CATH;  Surgeon: Fanny Skates, MD;  Location: [place];  Service: General;  Laterality: Right;  . SHOULDER ARTHROSCOPY W/ LABRAL REPAIR Right   . SHOULDER ARTHROSCOPY W/ ROTATOR CUFF REPAIR Left   . TENOTOMY FOREARM / WRIST Right 02/26/2017    There were no vitals filed for this visit.  Subjective Assessment - 01/11/19 1228    Subjective  My ROM is getting better. I am still having that nerve pain.    Pertinent History  Neoadjuvant chemo for right breast cancer 07/09/2018-10/01/2018. Bilateral mastectomy 11/06/2018 with right sentinel node biopsy.    Patient Stated Goals  Decrease arm pain and restore shoulder ROM    Currently in Pain?  Yes    Pain Score  5     Pain Location  Arm    Pain Orientation  Posterior;Upper    Pain Descriptors / Indicators  Numbness    Pain Type  Neuropathic pain    Pain Onset  More than a month ago  [facility] Adult PT Treatment/Exercise - 01/11/19 0001      Shoulder Exercises: Supine   Horizontal ABduction  Strengthening;Both;10 reps;Theraband   pt returned therapist demo   Theraband Level (Shoulder Horizontal ABduction)  Level 1 (Yellow)    External Rotation  Strengthening;Both;10 reps;Theraband   pt returned therapist demo   Theraband Level (Shoulder External Rotation)  Level 1 (Yellow)    Flexion  Strengthening;Both;10 reps;Theraband   narrow and wide grip, pt returned therapist demo   Theraband Level (Shoulder Flexion)  Level 1 (Yellow)    Diagonals  Strengthening;Both;10 reps;Theraband   pt returned therapist demo   Theraband Level (Shoulder  Diagonals)  Level 1 (Yellow)      Shoulder Exercises: Pulleys   Flexion  2 minutes    ABduction  2 minutes      Manual Therapy   Manual Therapy  Myofascial release;Passive ROM;Neural Stretch    Myofascial Release  Rt UE pulling throughout P/ROM and gently to axilla without stretch felt to healing incision    Passive ROM  To Rt shoulder into flexion, abduction and D2 to pts tolerance    Neural Stretch  to R UE into median nerve stretch, instructed pt in ulnar nerve stretch as well to do at home               PT Short Term Goals - 01/08/19 1214      PT SHORT TERM GOAL #1   Title  Patient will be independent with initial home exercise program.    Baseline  Pt independent with streches thus far and has been incorporating other stetches into ADLs-01/08/19    Status  Achieved      PT SHORT TERM GOAL #2   Title  Patient will increase right shoulder active flexion to >/= 120 degrees for increased ease reaching overhead.    Baseline  149 degrees-01/08/19    Status  Achieved      PT SHORT TERM GOAL #3   Title  Patient will increase right shoulder active abduction to >/= 120 degrees for increased ease reaching overhead.    Baseline  129 degrees-01/08/19    Status  Achieved      PT SHORT TERM GOAL #4   Title  Patient will verbalize good understanding of lymphedema risk reduction practices.    Status  On-going        PT Long Term Goals - 12/14/18 1155      PT LONG TERM GOAL #1   Title  Patient will be able to demonstrate independence with her home exercise program for shoulder ROM.    Time  8    Period  Weeks    Status  New    Target Date  02/08/19      PT LONG TERM GOAL #2   Title  Patient will increase right shoulder active flexion to >/= 140 degrees for increased ease reaching overhead.    Time  8    Period  Weeks    Status  New    Target Date  02/08/19      PT LONG TERM GOAL #3   Title  Patient will increase right shoulder active abduction to >/= 140 degrees for  increased ease reaching overhead.    Time  8    Period  Weeks    Status  New    Target Date  02/08/19      PT LONG TERM GOAL #4   Title  Patient will verbalize pain has reduced by >/=  40% since eval to tolerate daily tasks with greater ease.    Time  8    Period  Weeks    Status  New      PT LONG TERM GOAL #5   Title  Improve DASH score to </= 20 for imporved overall shoulder function.    Time  8    Period  Weeks    Status  New    Target Date  02/08/19            Plan - 01/11/19 1323    Clinical Impression Statement  Continued with AAROM and PROM to R shoulder today. Today she demonstrates nearly full PROM with no pulling at the incision. Educated pt in supine scapular strengthening exercises today and issued these as part of an HEP and gave pt yellow theraband. Pt was able to complete all of these with no pulling at incision. She is still having some nerve pain in upper right arm. Educated pt in ulnar and median nerve stretches today.    Rehab Potential  Good    PT Frequency  2x / week    PT Duration  8 weeks    PT Treatment/Interventions  ADLs/Self Care Home Management;Therapeutic activities;Therapeutic exercise;Manual lymph drainage;Manual techniques;Patient/family education;Scar mobilization;Passive range of motion    PT Next Visit Plan  Cont to assess wound; cont PROM to right shoulder, pulleys, ball up wall, etc, assess indep with supine scap series    PT Home Exercise Plan  Backward shoulder rolls, scapular retraction, median nerve stretch    Consulted and Agree with Plan of Care  Patient       Patient will benefit from skilled therapeutic intervention in order to improve the following deficits and impairments:  Decreased skin integrity, Pain, Impaired UE functional use, Increased fascial restricitons, Decreased strength, Decreased knowledge of precautions, Decreased scar mobility, Decreased range of motion, Postural dysfunction  Visit Diagnosis: 1. Aftercare following  surgery for neoplasm   2. Pain in right upper arm   3. Stiffness of right shoulder, not elsewhere classified   4. Abnormal posture        Problem List Patient Active Problem List   Diagnosis Date Noted  . Wound infection after surgery 11/28/2018  . Breast cancer ([place]) 11/26/2018  . Postoperative wound infection 11/26/2018  . Breast cancer of upper-outer quadrant of right female breast ([place]) 11/06/2018  . Genetic testing 08/20/2018  . 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 Dehydration 07/24/2018  . Nausea and vomiting 07/24/2018  . Nausea without vomiting 07/24/2018  . Family history of breast cancer   . Family history of colon cancer   . Family history of bladder cancer   . Family history of pancreatic cancer   . Port-A-Cath in place 07/16/2018  . Malignant neoplasm of upper-outer quadrant of right breast in female, estrogen receptor negative ([place]) 06/29/2018  . 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 Macromastia 11/19/2013  . Breast mass, right 10/20/2013  . Abnormal Pap smear 01/01/2012    Allyson Sabal [facility] 01/11/2019, 1:26 PM  [place], [place], [address] Phone: [phone]   Fax:  [phone]  Name: Holly Wiley MRN: [id number] Date of Birth: 11-Aug-1975  Manus Gunning, PT 01/11/19 1:26 PM

## 2019-01-14 ENCOUNTER — Ambulatory Visit: Payer: BC Managed Care – PPO | Admitting: Physical Therapy

## 2019-01-14 ENCOUNTER — Other Ambulatory Visit: Payer: Self-pay

## 2019-01-14 ENCOUNTER — Encounter: Payer: Self-pay | Admitting: Physical Therapy

## 2019-01-14 DIAGNOSIS — M79621 Pain in right upper arm: Secondary | ICD-10-CM

## 2019-01-14 DIAGNOSIS — M25611 Stiffness of right shoulder, not elsewhere classified: Secondary | ICD-10-CM

## 2019-01-14 DIAGNOSIS — Z483 Aftercare following surgery for neoplasm: Secondary | ICD-10-CM | POA: Diagnosis not present

## 2019-01-14 DIAGNOSIS — R293 Abnormal posture: Secondary | ICD-10-CM

## 2019-01-14 NOTE — Therapy (Signed)
[place], [place], [address] Phone: [phone]   Fax:  [phone]  Physical Therapy Treatment  Patient Details  Name: Holly Wiley MRN: [id number] Date of Birth: 1976-05-04 Referring Provider (PT): Dr. Fanny Skates   Encounter Date: 01/14/2019  PT End of Session - 01/14/19 1654    Visit Number  4    Number of Visits  9    Date for PT Re-Evaluation  02/08/19    PT Start Time  1230    PT Stop Time  1315    PT Time Calculation (min)  45 min    Equipment Utilized During Treatment  Gait belt    Activity Tolerance  Patient tolerated treatment well    Behavior During Therapy  [facility] for tasks assessed/performed       Past Medical History:  Diagnosis Date  . Breast cancer ([place]) 07/2018   right  . Dental crown present   . Family history of bladder cancer   . Family history of breast cancer   . Family history of colon cancer   . Family history of pancreatic cancer   . 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  . Migraines   . PONV (postoperative nausea and vomiting)     Past Surgical History:  Procedure Laterality Date  . BILATERAL TOTAL MASTECTOMY WITH AXILLARY LYMPH NODE DISSECTION N/A 11/06/2018   Procedure: BILATERAL TOTAL MASTECTOMY WITH AXILLARY LYMPH NODE DISSECTION;  Surgeon: Fanny Skates, MD;  Location: [place];  Service: General;  Laterality: N/A;  . BREAST BIOPSY Right 11/08/2013   Procedure: EXCISION OF RIGHT  BREAST MASS;  Surgeon: Adin Hector, MD;  Location: [place];  Service: General;  Laterality: Right;  . BUNIONECTOMY Left   . CARPAL TUNNEL RELEASE Right 02/26/2017  . CERVICAL CONE BIOPSY  05/2005  . COLONOSCOPY  2014  . DRESSING CHANGE UNDER ANESTHESIA Right 11/29/2018   Procedure: DRESSING CHANGE UNDER ANESTHESIA;  Surgeon: Fanny Skates, MD;  Location: [place];  Service: General;  Laterality: Right;  . IRRIGATION AND  DEBRIDEMENT ABSCESS Right 11/28/2018   Procedure: Exploration and Drainage of Right Mastectomy wound.;  Surgeon: Fanny Skates, MD;  Location: [place];  Service: General;  Laterality: Right;  . PORT-A-CATH REMOVAL N/A 11/06/2018   Procedure: Removal Port-A-Cath;  Surgeon: Fanny Skates, MD;  Location: [place];  Service: General;  Laterality: N/A;  . PORTACATH PLACEMENT Right 07/08/2018   Procedure: INSERTION PORT-A-CATH;  Surgeon: Fanny Skates, MD;  Location: [place];  Service: General;  Laterality: Right;  . SHOULDER ARTHROSCOPY W/ LABRAL REPAIR Right   . SHOULDER ARTHROSCOPY W/ ROTATOR CUFF REPAIR Left   . TENOTOMY FOREARM / WRIST Right 02/26/2017    There were no vitals filed for this visit.  Subjective Assessment - 01/14/19 1653    Subjective  Pt is still having wound care but is progressing.  She is still having neve pain in her right arm.  She feels that her left shoulder is back to "baseline" Her right arm nerve pain is "horrible" Pt is going back to see surgeon and Dr. Nelva Bush next week. She wonders if Dr. Nelva Bush will offer her a nerve block         [facility] PT Assessment - 01/14/19 0001      AROM   Right Shoulder Flexion  145 Degrees    Right Shoulder ABduction  130 Degrees  [place] Adult PT Treatment/Exercise - 01/14/19 0001      Self-Care   Self-Care  Other Self-Care Comments    Other Self-Care Comments   gave pt a medium tg soft doubled on upper arm to see if she could get some relief from nerve pain and provide some densitizaion to skin affected by neuropathy       Exercises   Exercises  Shoulder      Shoulder Exercises: Supine   Protraction  AROM;Right;10 reps    Protraction Limitations  pt with fatigue       Shoulder Exercises: Sidelying   External Rotation  Strengthening;Right;10 reps;Weights    External Rotation Weight (lbs)  2    Flexion  AROM;Right;5 reps    Flexion Limitations  to 90 degrees of shoulder flexion     ABduction  AROM;Right;5 reps    ABduction Limitations  pt had diffuculty controlling this exercise with several episode of  uneven movment.     Other Sidelying Exercises  small circles with hand pointed to ceiling with small circle as pt could not control arm in movement of a larger circle       Shoulder Exercises: Standing   External Rotation  Strengthening;Right;Left;5 reps    External Rotation Limitations  red theraband walk outs x 5 reps     Row  Strengthening;Right;Left;10 reps    Theraband Level (Shoulder Row)  Level 2 (Red)    Row Limitations  cues to keep core engaged     Other Standing Exercises  attempted to do isometrics for deltoid strengthening, but pt was unable to do it due to "nerve' pain                PT Short Term Goals - 01/14/19 1244      PT SHORT TERM GOAL #1   Title  Patient will be independent with initial home exercise program.    Baseline  Pt independent with streches thus far and has been incorporating other stetches into ADLs-01/08/19    Status  Achieved      PT SHORT TERM GOAL #2   Title  Patient will increase right shoulder active flexion to >/= 120 degrees for increased ease reaching overhead.    Baseline  149 degrees-01/08/19    Status  Achieved      PT SHORT TERM GOAL #3   Title  Patient will increase right shoulder active abduction to >/= 120 degrees for increased ease reaching overhead.    Status  Achieved      PT SHORT TERM GOAL #4   Title  Patient will verbalize good understanding of lymphedema risk reduction practices.    Status  Achieved        PT Long Term Goals - 01/14/19 1245      PT LONG TERM GOAL #1   Title  Patient will be able to demonstrate independence with her home exercise program for shoulder ROM.    Status  Achieved      PT LONG TERM GOAL #2   Title  Patient will increase right shoulder active flexion to >/= 140 degrees for increased ease reaching overhead.    Baseline  135 on 7/16    Time  8    Period  Weeks     Status  On-going      PT LONG TERM GOAL #3   Title  Patient will increase right shoulder active abduction to >/= 140 degrees for increased ease reaching overhead.    Baseline  145 on 7/16    Time  8    Period  Weeks    Status  Achieved      PT LONG TERM GOAL #4   Title  Patient will verbalize pain has reduced by >/= 40% since eval to tolerate daily tasks with greater ease.    Baseline  pt is still having nerve pain    Status  On-going      PT LONG TERM GOAL #5   Title  Improve DASH score to </= 20 for imporved overall shoulder function.    Time  8    Status  On-going            Plan - 01/14/19 1656    Clinical Impression Statement  Pt continues to be limited by nerve pain and decrased ROM and strength of right shoulder . Today she expressed frustration to the point of tears that she has not been able to return to work.  She had diffiuclty controlling her arm in AROM in sidelying today and appears to have deltoid weakness. She is doing the supine scapulare series taught last session and will benefit from continud stregnthening in mutiple body positions    PT Treatment/Interventions  ADLs/Self Care Home Management;Therapeutic activities;Therapeutic exercise;Manual lymph drainage;Manual techniques;Patient/family education;Scar mobilization;Passive range of motion    PT Next Visit Plan  Cont to assess wound; cont PROM to right shoulder, pulleys, ball up wall, etc, assess indep with supine scap series and add deltoid strengthening. consider exercise in sidelying with emphasis on contol of movement       Patient will benefit from skilled therapeutic intervention in order to improve the following deficits and impairments:  Decreased skin integrity, Pain, Impaired UE functional use, Increased fascial restricitons, Decreased strength, Decreased knowledge of precautions, Decreased scar mobility, Decreased range of motion, Postural dysfunction  Visit Diagnosis: 1. Aftercare following surgery  for neoplasm   2. Pain in right upper arm   3. Stiffness of right shoulder, not elsewhere classified   4. Abnormal posture        Problem List Patient Active Problem List   Diagnosis Date Noted  . Wound infection after surgery 11/28/2018  . Breast cancer ([place]) 11/26/2018  . Postoperative wound infection 11/26/2018  . Breast cancer of upper-outer quadrant of right female breast ([place]) 11/06/2018  . Genetic testing 08/20/2018  . 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 Dehydration 07/24/2018  . Nausea and vomiting 07/24/2018  . Nausea without vomiting 07/24/2018  . Family history of breast cancer   . Family history of colon cancer   . Family history of bladder cancer   . Family history of pancreatic cancer   . Port-A-Cath in place 07/16/2018  . Malignant neoplasm of upper-outer quadrant of right breast in female, estrogen receptor negative ([place]) 06/29/2018  . 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 Macromastia 11/19/2013  . Breast mass, right 10/20/2013  . Abnormal Pap smear 01/01/2012   Donato Heinz. Owens Shark PT  Norwood Levo 01/14/2019, 5:00 PM  [place], [place], [address] Phone: [phone]   Fax:  [phone]  Name: Holly Wiley MRN: [id number] Date of Birth: 1975/08/27

## 2019-01-18 ENCOUNTER — Other Ambulatory Visit: Payer: Self-pay

## 2019-01-18 ENCOUNTER — Ambulatory Visit: Payer: BC Managed Care – PPO | Admitting: Physical Therapy

## 2019-01-18 DIAGNOSIS — R293 Abnormal posture: Secondary | ICD-10-CM

## 2019-01-18 DIAGNOSIS — M25611 Stiffness of right shoulder, not elsewhere classified: Secondary | ICD-10-CM

## 2019-01-18 DIAGNOSIS — Z483 Aftercare following surgery for neoplasm: Secondary | ICD-10-CM

## 2019-01-18 DIAGNOSIS — M79621 Pain in right upper arm: Secondary | ICD-10-CM

## 2019-01-18 NOTE — Therapy (Signed)
[place], [place], [address] Phone: [phone]   Fax:  [phone]  Physical Therapy Treatment  Patient Details  Name: Holly Wiley MRN: [id number] Date of Birth: March 10, 1976 Referring Provider (PT): Dr. Fanny Skates   Encounter Date: 01/18/2019  PT End of Session - 01/18/19 1651    Visit Number  5    Number of Visits  9    Date for PT Re-Evaluation  02/08/19    PT Start Time  1230    PT Stop Time  1315    PT Time Calculation (min)  45 min    Activity Tolerance  Patient tolerated treatment well    Behavior During Therapy  [facility] for tasks assessed/performed       Past Medical History:  Diagnosis Date  . Breast cancer ([place]) 07/2018   right  . Dental crown present   . Family history of bladder cancer   . Family history of breast cancer   . Family history of colon cancer   . Family history of pancreatic cancer   . 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  . Migraines   . PONV (postoperative nausea and vomiting)     Past Surgical History:  Procedure Laterality Date  . BILATERAL TOTAL MASTECTOMY WITH AXILLARY LYMPH NODE DISSECTION N/A 11/06/2018   Procedure: BILATERAL TOTAL MASTECTOMY WITH AXILLARY LYMPH NODE DISSECTION;  Surgeon: Fanny Skates, MD;  Location: [place];  Service: General;  Laterality: N/A;  . BREAST BIOPSY Right 11/08/2013   Procedure: EXCISION OF RIGHT  BREAST MASS;  Surgeon: Adin Hector, MD;  Location: [place];  Service: General;  Laterality: Right;  . BUNIONECTOMY Left   . CARPAL TUNNEL RELEASE Right 02/26/2017  . CERVICAL CONE BIOPSY  05/2005  . COLONOSCOPY  2014  . DRESSING CHANGE UNDER ANESTHESIA Right 11/29/2018   Procedure: DRESSING CHANGE UNDER ANESTHESIA;  Surgeon: Fanny Skates, MD;  Location: [place];  Service: General;  Laterality: Right;  . IRRIGATION AND DEBRIDEMENT ABSCESS Right 11/28/2018   Procedure:  Exploration and Drainage of Right Mastectomy wound.;  Surgeon: Fanny Skates, MD;  Location: [place];  Service: General;  Laterality: Right;  . PORT-A-CATH REMOVAL N/A 11/06/2018   Procedure: Removal Port-A-Cath;  Surgeon: Fanny Skates, MD;  Location: [place];  Service: General;  Laterality: N/A;  . PORTACATH PLACEMENT Right 07/08/2018   Procedure: INSERTION PORT-A-CATH;  Surgeon: Fanny Skates, MD;  Location: [place];  Service: General;  Laterality: Right;  . SHOULDER ARTHROSCOPY W/ LABRAL REPAIR Right   . SHOULDER ARTHROSCOPY W/ ROTATOR CUFF REPAIR Left   . TENOTOMY FOREARM / WRIST Right 02/26/2017    There were no vitals filed for this visit.  Subjective Assessment - 01/18/19 1235    Subjective  Pt states she is having the return of "searing pain" down the back of her arm    Pertinent History  Neoadjuvant chemo for right breast cancer 07/09/2018-10/01/2018. Bilateral mastectomy 11/06/2018 with right sentinel node biopsy.    Patient Stated Goals  Decrease arm pain and restore shoulder ROM    Currently in Pain?  Yes    Pain Score  6    with touch to back of arm   Pain Location  Arm    Pain Orientation  Right    Pain Descriptors / Indicators  Burning;Shooting    Pain Type  Neuropathic pain    Pain Radiating Towards  right elbow  Pain Onset  More than a month ago    Pain Frequency  Intermittent    Aggravating Factors   touch to back of arm                       [facility] Adult PT Treatment/Exercise - 01/18/19 0001      Shoulder Exercises: Supine   Other Supine Exercises  dowel stretch in supine for overhead stretch and also for protraction, retractions       Shoulder Exercises: Sidelying   ABduction  AROM;Right;5 reps    Other Sidelying Exercises  small circles with hand pointed to ceiling with small circle as pt could not control arm in movement of a larger circle       Manual Therapy   Manual Therapy  Soft tissue mobilization;Myofascial  release;Scapular mobilization    Manual therapy comments  rubbed posterior arm with towel to wipe off massage cream and pt had increased pain     Soft tissue mobilization  with thick massage cream, soft tissue work to posterior scapular and upper trap muscles     Myofascial Release  prolonged deep pressure to trigger points at posterior shoulder and expecially at posterior axilla near latts tendon ( very tender at this area     Scapular Mobilization  inferior glides to stretch upper traps  and also at lateral border of scapula for upper rotations                PT Short Term Goals - 01/14/19 1244      PT SHORT TERM GOAL #1   Title  Patient will be independent with initial home exercise program.    Baseline  Pt independent with streches thus far and has been incorporating other stetches into ADLs-01/08/19    Status  Achieved      PT SHORT TERM GOAL #2   Title  Patient will increase right shoulder active flexion to >/= 120 degrees for increased ease reaching overhead.    Baseline  149 degrees-01/08/19    Status  Achieved      PT SHORT TERM GOAL #3   Title  Patient will increase right shoulder active abduction to >/= 120 degrees for increased ease reaching overhead.    Status  Achieved      PT SHORT TERM GOAL #4   Title  Patient will verbalize good understanding of lymphedema risk reduction practices.    Status  Achieved        PT Long Term Goals - 01/14/19 1245      PT LONG TERM GOAL #1   Title  Patient will be able to demonstrate independence with her home exercise program for shoulder ROM.    Status  Achieved      PT LONG TERM GOAL #2   Title  Patient will increase right shoulder active flexion to >/= 140 degrees for increased ease reaching overhead.    Baseline  135 on 7/16    Time  8    Period  Weeks    Status  On-going      PT LONG TERM GOAL #3   Title  Patient will increase right shoulder active abduction to >/= 140 degrees for increased ease reaching overhead.     Baseline  145 on 7/16    Time  8    Period  Weeks    Status  Achieved      PT LONG TERM GOAL #4   Title  Patient will  verbalize pain has reduced by >/= 40% since eval to tolerate daily tasks with greater ease.    Baseline  pt is still having nerve pain    Status  On-going      PT LONG TERM GOAL #5   Title  Improve DASH score to </= 20 for imporved overall shoulder function.    Time  8    Status  On-going            Plan - 01/18/19 1652    Clinical Impression Statement  Pt continues to be limited by pain in her right shoulder, especially to touch at the back of her arm.  She is looking forward to seeing her surgeon and nerve doctor this week and may possibly have a nerve block. She hope it will help with the pain.  She did receive some relief with soft tissue work to tender periscapular trigger points today.    Comorbidities  Recent infection    Rehab Potential  Good    PT Frequency  2x / week    PT Duration  8 weeks    PT Treatment/Interventions  ADLs/Self Care Home Management;Therapeutic activities;Therapeutic exercise;Manual lymph drainage;Manual techniques;Patient/family education;Scar mobilization;Passive range of motion    PT Next Visit Plan  Assess how pt did after sof tissue work, continue if it was helpful..Ask about results of doctor's appointments Cont to assess wound; cont PROM to right shoulder, pulleys, ball up wall, etc, assess indep with supine scap series and add deltoid strengthening. consider exercise in sidelying with emphasis on contol of movement    Consulted and Agree with Plan of Care  Patient       Patient will benefit from skilled therapeutic intervention in order to improve the following deficits and impairments:  Decreased skin integrity, Pain, Impaired UE functional use, Increased fascial restricitons, Decreased strength, Decreased knowledge of precautions, Decreased scar mobility, Decreased range of motion, Postural dysfunction  Visit Diagnosis: 1.  Aftercare following surgery for neoplasm   2. Pain in right upper arm   3. Stiffness of right shoulder, not elsewhere classified   4. Abnormal posture        Problem List Patient Active Problem List   Diagnosis Date Noted  . Wound infection after surgery 11/28/2018  . Breast cancer ([place]) 11/26/2018  . Postoperative wound infection 11/26/2018  . Breast cancer of upper-outer quadrant of right female breast ([place]) 11/06/2018  . Genetic testing 08/20/2018  . 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 Dehydration 07/24/2018  . Nausea and vomiting 07/24/2018  . Nausea without vomiting 07/24/2018  . Family history of breast cancer   . Family history of colon cancer   . Family history of bladder cancer   . Family history of pancreatic cancer   . Port-A-Cath in place 07/16/2018  . Malignant neoplasm of upper-outer quadrant of right breast in female, estrogen receptor negative ([place]) 06/29/2018  . 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 Macromastia 11/19/2013  . Breast mass, right 10/20/2013  . Abnormal Pap smear 01/01/2012   Donato Heinz. Owens Shark PT  Norwood Levo 01/18/2019, 4:55 PM  [place], [place], [address] Phone: [phone]   Fax:  [phone]  Name: Holly Wiley MRN: [id number] Date of Birth: [birth year]

## 2019-01-21 ENCOUNTER — Other Ambulatory Visit: Payer: Self-pay

## 2019-01-21 ENCOUNTER — Encounter: Payer: Self-pay | Admitting: Physical Therapy

## 2019-01-21 ENCOUNTER — Ambulatory Visit: Payer: BC Managed Care – PPO | Admitting: Physical Therapy

## 2019-01-21 DIAGNOSIS — Z483 Aftercare following surgery for neoplasm: Secondary | ICD-10-CM | POA: Diagnosis not present

## 2019-01-21 DIAGNOSIS — R293 Abnormal posture: Secondary | ICD-10-CM

## 2019-01-21 DIAGNOSIS — M25611 Stiffness of right shoulder, not elsewhere classified: Secondary | ICD-10-CM

## 2019-01-21 DIAGNOSIS — M79621 Pain in right upper arm: Secondary | ICD-10-CM

## 2019-01-21 NOTE — Therapy (Signed)
[place], [place], [address] Phone: [phone]   Fax:  [phone]  Physical Therapy Treatment  Patient Details  Name: Holly Wiley MRN: [id number] Date of Birth: [birth year] Referring Provider (PT): Dr. Fanny Skates   Encounter Date: 01/21/2019  PT End of Session - 01/21/19 1320    Visit Number  6    Number of Visits  9    Date for PT Re-Evaluation  02/08/19    PT Start Time  1230    PT Stop Time  1315    PT Time Calculation (min)  45 min    Activity Tolerance  Patient limited by pain       Past Medical History:  Diagnosis Date  . Breast cancer ([place]) 07/2018   right  . Dental crown present   . Family history of bladder cancer   . Family history of breast cancer   . Family history of colon cancer   . Family history of pancreatic cancer   . 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  . Migraines   . PONV (postoperative nausea and vomiting)     Past Surgical History:  Procedure Laterality Date  . BILATERAL TOTAL MASTECTOMY WITH AXILLARY LYMPH NODE DISSECTION N/A 11/06/2018   Procedure: BILATERAL TOTAL MASTECTOMY WITH AXILLARY LYMPH NODE DISSECTION;  Surgeon: Fanny Skates, MD;  Location: [facility];  Service: General;  Laterality: N/A;  . BREAST BIOPSY Right 11/08/2013   Procedure: EXCISION OF RIGHT  BREAST MASS;  Surgeon: Adin Hector, MD;  Location: [facility];  Service: General;  Laterality: Right;  . BUNIONECTOMY Left   . CARPAL TUNNEL RELEASE Right 02/26/2017  . CERVICAL CONE BIOPSY  05/2005  . COLONOSCOPY  2014  . DRESSING CHANGE UNDER ANESTHESIA Right 11/29/2018   Procedure: DRESSING CHANGE UNDER ANESTHESIA;  Surgeon: Fanny Skates, MD;  Location: [facility];  Service: General;  Laterality: Right;  . IRRIGATION AND DEBRIDEMENT ABSCESS Right 11/28/2018   Procedure: Exploration and Drainage of Right Mastectomy wound.;  Surgeon: Fanny Skates, MD;  Location: [facility];  Service: General;  Laterality: Right;  . PORT-A-CATH REMOVAL N/A 11/06/2018   Procedure: Removal Port-A-Cath;  Surgeon: Fanny Skates, MD;  Location: [facility];  Service: General;  Laterality: N/A;  . PORTACATH PLACEMENT Right 07/08/2018   Procedure: INSERTION PORT-A-CATH;  Surgeon: Fanny Skates, MD;  Location: [facility];  Service: General;  Laterality: Right;  . SHOULDER ARTHROSCOPY W/ LABRAL REPAIR Right   . SHOULDER ARTHROSCOPY W/ ROTATOR CUFF REPAIR Left   . TENOTOMY FOREARM / WRIST Right 02/26/2017    There were no vitals filed for this visit.  Subjective Assessment - 01/21/19 1230    Subjective  Pt went to see Dr. Dalbert Batman yesterday and he is pleased with her wound healing.  She went to see Dr. Nelva Bush yesterday and she will be decreasing her gabapentin and will be getting nerve blocks and she will be getting a call to schedule.  She says the pain in her scapula is not good.  Pt is tearful. She is starting to go to a massage therapist once a week.    Pertinent History  Neoadjuvant chemo for right breast cancer 07/09/2018-10/01/2018. Bilateral mastectomy 11/06/2018 with right sentinel node biopsy.    Patient Stated Goals  Decrease arm pain and restore shoulder ROM    Currently in Pain?  Yes    Pain Score  5     Pain  Location  Back    Pain Orientation  Posterior;Medial   in between her shoulder blades   Pain Descriptors / Indicators  Dull;Stabbing    Pain Type  Neuropathic pain    Pain Radiating Towards  right elbow    Pain Onset  More than a month ago    Pain Frequency  Intermittent    Aggravating Factors   touch at back of arm                       [facility] Adult PT Treatment/Exercise - 01/21/19 0001      Self-Care   Self-Care  Other Self-Care Comments    Other Self-Care Comments   gave pt information about getting a TENS unit.  She has used one in the past        Exercises   Exercises  Shoulder;Lumbar      Lumbar Exercises:  Supine   Pelvic Tilt  1 rep    Heel Slides  10 reps    Bent Knee Raise  10 reps    Bridge  10 reps    Other Supine Lumbar Exercises  lower trunk rotation       Lumbar Exercises: Sidelying   Clam  Right;Left;10 reps    Hip Abduction  Right;Left;10 reps      Shoulder Exercises: Supine   Protraction  AAROM;Left;5 reps    Flexion  AROM;Right;Left;5 reps    Flexion Limitations  over purple ball at thoracic spine and pillow under head       Shoulder Exercises: Sidelying   ABduction  AROM;Right;5 reps    Other Sidelying Exercises  small circles with hand pointed to ceiling with small circle as pt could not control arm in movement of a larger circle       Shoulder Exercises: Standing   External Rotation  Strengthening;Left;5 reps;Theraband    Theraband Level (Shoulder External Rotation)  Level 3 (Green)    Internal Rotation  Strengthening;Left;5 reps;Theraband    Theraband Level (Shoulder Internal Rotation)  Level 3 (Green)    Flexion  Strengthening;Left;5 reps;Theraband    Theraband Level (Shoulder Flexion)  Level 3 Nyoka Cowden)             PT Education - 01/21/19 1320    Education Details  rockwoods with resistance band for left arm only    Person(s) Educated  Patient    Methods  Explanation;Demonstration;Handout    Comprehension  Verbalized understanding;Returned demonstration       PT Short Term Goals - 01/14/19 1244      PT SHORT TERM GOAL #1   Title  Patient will be independent with initial home exercise program.    Baseline  Pt independent with streches thus far and has been incorporating other stetches into ADLs-01/08/19    Status  Achieved      PT SHORT TERM GOAL #2   Title  Patient will increase right shoulder active flexion to >/= 120 degrees for increased ease reaching overhead.    Baseline  149 degrees-01/08/19    Status  Achieved      PT SHORT TERM GOAL #3   Title  Patient will increase right shoulder active abduction to >/= 120 degrees for increased ease  reaching overhead.    Status  Achieved      PT SHORT TERM GOAL #4   Title  Patient will verbalize good understanding of lymphedema risk reduction practices.    Status  Achieved  PT Long Term Goals - 01/14/19 1245      PT LONG TERM GOAL #1   Title  Patient will be able to demonstrate independence with her home exercise program for shoulder ROM.    Status  Achieved      PT LONG TERM GOAL #2   Title  Patient will increase right shoulder active flexion to >/= 140 degrees for increased ease reaching overhead.    Baseline  135 on 7/16    Time  8    Period  Weeks    Status  On-going      PT LONG TERM GOAL #3   Title  Patient will increase right shoulder active abduction to >/= 140 degrees for increased ease reaching overhead.    Baseline  145 on 7/16    Time  8    Period  Weeks    Status  Achieved      PT LONG TERM GOAL #4   Title  Patient will verbalize pain has reduced by >/= 40% since eval to tolerate daily tasks with greater ease.    Baseline  pt is still having nerve pain    Status  On-going      PT LONG TERM GOAL #5   Title  Improve DASH score to </= 20 for imporved overall shoulder function.    Time  8    Status  On-going            Plan - 01/21/19 1321    Clinical Impression Statement  Pt continues to have significant right arm "nerve" pain and is frustrated and upset to the point of tears.  Focused on ative exercise today for core and left arm strengthening as well a AROM of spine and right shoulder.  Pt will consider getting a TENS unit and will bring it in for instruction if she decides to get one.She will ask Dr. Nelva Bush about acitivity restrictions after nerve block    PT Duration  8 weeks    PT Treatment/Interventions  ADLs/Self Care Home Management;Therapeutic activities;Therapeutic exercise;Manual lymph drainage;Manual techniques;Patient/family education;Scar mobilization;Passive range of motion    PT Next Visit Plan  Assess how pt did afte core and  left arm strengthening  continue if it was helpful. Cont to assess wound; cont PROM to right shoulder, pulleys, ball up wall, etc,consider closed chain activity deltoid strengthening. consider exercise in sidelying with emphasis on contol of    PT Home Exercise Plan  Backward shoulder rolls, scapular retraction, median nerve stretch rockwoods for left.  Pt asked not to do supine scap series as that may have been causing her pain    Consulted and Agree with Plan of Care  Patient       Patient will benefit from skilled therapeutic intervention in order to improve the following deficits and impairments:  Decreased skin integrity, Pain, Impaired UE functional use, Increased fascial restricitons, Decreased strength, Decreased knowledge of precautions, Decreased scar mobility, Decreased range of motion, Postural dysfunction  Visit Diagnosis: 1. Aftercare following surgery for neoplasm   2. Pain in right upper arm   3. Stiffness of right shoulder, not elsewhere classified   4. Abnormal posture        Problem List Patient Active Problem List   Diagnosis Date Noted  . Wound infection after surgery 11/28/2018  . Breast cancer ([place]) 11/26/2018  . Postoperative wound infection 11/26/2018  . Breast cancer of upper-outer quadrant of right female breast ([facility]) 11/06/2018  . Genetic testing 08/20/2018  .  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 Dehydration 07/24/2018  . Nausea and vomiting 07/24/2018  . Nausea without vomiting 07/24/2018  . Family history of breast cancer   . Family history of colon cancer   . Family history of bladder cancer   . Family history of pancreatic cancer   . Port-A-Cath in place 07/16/2018  . Malignant neoplasm of upper-outer quadrant of right breast in female, estrogen receptor negative ([place]) 06/29/2018  . 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 Macromastia 11/19/2013  . Breast mass, right 10/20/2013  . Abnormal Pap smear  01/01/2012   Donato Heinz. Owens Shark PT  Norwood Levo 01/21/2019, 1:27 PM  [place], [place], [address] Phone: [phone]   Fax:  [phone]  Name: JENIYA FLANNIGAN MRN: [id number] Date of Birth: 1976-01-31

## 2019-01-21 NOTE — Patient Instructions (Signed)
Strengthening: Resisted Flexion    Cancer Rehab [id number]    Hold tubing with left arm at side. Pull forward and up. Move shoulder through pain-free range of motion. Repeat _5-10___ times per set. Do _1-2___ sessions per day.  Strengthening: Resisted Internal Rotation    Hold tubing in left hand, elbow at side and forearm out. Rotate forearm in across body. Repeat _5-10___ times per set. Do _1-2___ sessions per day.  Strengthening: Resisted Extension    Hold tubing in left hand, arm forward. Pull arm back, elbow straight. Repeat __5-10__ times per set. Do __1-2__ sessions per day.   Strengthening: Resisted External Rotation    Hold tubing in left hand, elbow at side and forearm across body. Rotate forearm out. Repeat _5-10___ times per set. Do __1-2__ sessions per day.   Also do band walkouts.    Only do these strengthening with the left arm, but do range of motion within in your limits with the right arm   Keep doing deep breathing and  Try to expand your ribs

## 2019-01-25 ENCOUNTER — Other Ambulatory Visit: Payer: Self-pay

## 2019-01-25 ENCOUNTER — Encounter: Payer: Self-pay | Admitting: Physical Therapy

## 2019-01-25 ENCOUNTER — Ambulatory Visit: Payer: BC Managed Care – PPO | Admitting: Physical Therapy

## 2019-01-25 DIAGNOSIS — R293 Abnormal posture: Secondary | ICD-10-CM

## 2019-01-25 DIAGNOSIS — Z483 Aftercare following surgery for neoplasm: Secondary | ICD-10-CM | POA: Diagnosis not present

## 2019-01-25 DIAGNOSIS — M79621 Pain in right upper arm: Secondary | ICD-10-CM

## 2019-01-25 DIAGNOSIS — M25611 Stiffness of right shoulder, not elsewhere classified: Secondary | ICD-10-CM

## 2019-01-25 NOTE — Therapy (Signed)
[place], [place], [address] Phone: [phone]   Fax:  [phone]  Physical Therapy Treatment  Patient Details  Name: Holly Wiley MRN: [id number] Date of Birth: [birth year] Referring Provider (PT): Dr. Fanny Skates   Encounter Date: 01/25/2019  PT End of Session - 01/25/19 1544    Visit Number  8    Number of Visits  9    Date for PT Re-Evaluation  02/08/19    PT Start Time  1230    PT Stop Time  1315    PT Time Calculation (min)  45 min    Activity Tolerance  Patient tolerated treatment well    Behavior During Therapy  [facility] for tasks assessed/performed       Past Medical History:  Diagnosis Date  . Breast cancer ([facility]) 07/2018   right  . Dental crown present   . Family history of bladder cancer   . Family history of breast cancer   . Family history of colon cancer   . Family history of pancreatic cancer   . 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  . Migraines   . PONV (postoperative nausea and vomiting)     Past Surgical History:  Procedure Laterality Date  . BILATERAL TOTAL MASTECTOMY WITH AXILLARY LYMPH NODE DISSECTION N/A 11/06/2018   Procedure: BILATERAL TOTAL MASTECTOMY WITH AXILLARY LYMPH NODE DISSECTION;  Surgeon: Fanny Skates, MD;  Location: [facility];  Service: General;  Laterality: N/A;  . BREAST BIOPSY Right 11/08/2013   Procedure: EXCISION OF RIGHT  BREAST MASS;  Surgeon: Adin Hector, MD;  Location: [facility];  Service: General;  Laterality: Right;  . BUNIONECTOMY Left   . CARPAL TUNNEL RELEASE Right 02/26/2017  . CERVICAL CONE BIOPSY  05/2005  . COLONOSCOPY  2014  . DRESSING CHANGE UNDER ANESTHESIA Right 11/29/2018   Procedure: DRESSING CHANGE UNDER ANESTHESIA;  Surgeon: Fanny Skates, MD;  Location: [facility];  Service: General;  Laterality: Right;  . IRRIGATION AND DEBRIDEMENT ABSCESS Right 11/28/2018   Procedure:  Exploration and Drainage of Right Mastectomy wound.;  Surgeon: Fanny Skates, MD;  Location: [facility];  Service: General;  Laterality: Right;  . PORT-A-CATH REMOVAL N/A 11/06/2018   Procedure: Removal Port-A-Cath;  Surgeon: Fanny Skates, MD;  Location: [facility];  Service: General;  Laterality: N/A;  . PORTACATH PLACEMENT Right 07/08/2018   Procedure: INSERTION PORT-A-CATH;  Surgeon: Fanny Skates, MD;  Location: [facility];  Service: General;  Laterality: Right;  . SHOULDER ARTHROSCOPY W/ LABRAL REPAIR Right   . SHOULDER ARTHROSCOPY W/ ROTATOR CUFF REPAIR Left   . TENOTOMY FOREARM / WRIST Right 02/26/2017    There were no vitals filed for this visit.  Subjective Assessment - 01/25/19 1235    Subjective  "I feel rotten"  pt states she is decreasing the gabapentin. Her pain has not gotten any worse, but she just feels drugged.    Pertinent History  Neoadjuvant chemo for right breast cancer 07/09/2018-10/01/2018. Bilateral mastectomy 11/06/2018 with right sentinel node biopsy.    Currently in Pain?  Yes    Pain Score  5     Pain Location  Arm    Pain Orientation  Posterior    Pain Descriptors / Indicators  Burning   classic nerve pain   Pain Type  Neuropathic pain    Pain Onset  More than a month ago  [facility] Adult PT Treatment/Exercise - 01/25/19 0001      Exercises   Exercises  Other Exercises    Other Exercises   Instructed in Strength ABC program for strengthening.  Pt  is familiar with exercise and she was able to demonstrate good form with all stretches and exercises. pt also given Med[employer] version of strength ABC as well as written copy.  she expressed understanding       Shoulder Exercises: Supine   Other Supine Exercises  over foam roller with head and sacrum on roller,  Pt was able to do single and double arm felxion and abduction and adduction as well at single and alternating knee raise.       Shoulder Exercises: Standing   Other  Standing Exercises  wall push ups from elbows and also extended arms,             PT Education - 01/25/19 1544    Education Details  Strength ABC program    Person(s) Educated  Patient    Methods  Explanation;Demonstration    Comprehension  Verbalized understanding;Returned demonstration       PT Short Term Goals - 01/14/19 1244      PT SHORT TERM GOAL #1   Title  Patient will be independent with initial home exercise program.    Baseline  Pt independent with streches thus far and has been incorporating other stetches into ADLs-01/08/19    Status  Achieved      PT SHORT TERM GOAL #2   Title  Patient will increase right shoulder active flexion to >/= 120 degrees for increased ease reaching overhead.    Baseline  149 degrees-01/08/19    Status  Achieved      PT SHORT TERM GOAL #3   Title  Patient will increase right shoulder active abduction to >/= 120 degrees for increased ease reaching overhead.    Status  Achieved      PT SHORT TERM GOAL #4   Title  Patient will verbalize good understanding of lymphedema risk reduction practices.    Status  Achieved        PT Long Term Goals - 01/14/19 1245      PT LONG TERM GOAL #1   Title  Patient will be able to demonstrate independence with her home exercise program for shoulder ROM.    Status  Achieved      PT LONG TERM GOAL #2   Title  Patient will increase right shoulder active flexion to >/= 140 degrees for increased ease reaching overhead.    Baseline  135 on 7/16    Time  8    Period  Weeks    Status  On-going      PT LONG TERM GOAL #3   Title  Patient will increase right shoulder active abduction to >/= 140 degrees for increased ease reaching overhead.    Baseline  145 on 7/16    Time  8    Period  Weeks    Status  Achieved      PT LONG TERM GOAL #4   Title  Patient will verbalize pain has reduced by >/= 40% since eval to tolerate daily tasks with greater ease.    Baseline  pt is still having nerve pain     Status  On-going      PT LONG TERM GOAL #5   Title  Improve DASH score to </= 20 for imporved overall shoulder function.    Time  8    Status  On-going            Plan - 01/25/19 1545    Clinical Impression Statement  Pt continues to struggle with arm pain and with feeling bad as she decreased the gabapentin.  Focused on general stregthening today and pt was able to do well.  Used a weight for left arm work only. She said she will try her TENS unit at home. She wants to get back to work as soon as possible, but is not able due to pain. Hopefully, she will feel better with general body strengthening and be able to manage her pain with nerve block and TENS    Comorbidities  Recent infection    Rehab Potential  Good    PT Frequency  2x / week    PT Duration  8 weeks    PT Treatment/Interventions  ADLs/Self Care Home Management;Therapeutic activities;Therapeutic exercise;Manual lymph drainage;Manual techniques;Patient/family education;Scar mobilization;Passive range of motion    PT Next Visit Plan  Needs renewal soon, decrease to one time a week ?? Assess how pt did after strength ABC and if she used her TENS at home  continue if it was helpful. Cont to assess wound; cont PROM to right shoulder, pulleys, ball up wall, etc,consider closed chain activity deltoid strengthening. consider exercise in sidelying with emphasis on contol of    PT Home Exercise Plan  Backward shoulder rolls, scapular retraction, median nerve stretch rockwoods for left.  Pt asked not to do supine scap series as that may have been causing her pain       Patient will benefit from skilled therapeutic intervention in order to improve the following deficits and impairments:  Decreased skin integrity, Pain, Impaired UE functional use, Increased fascial restricitons, Decreased strength, Decreased knowledge of precautions, Decreased scar mobility, Decreased range of motion, Postural dysfunction  Visit Diagnosis: 1. Aftercare  following surgery for neoplasm   2. Pain in right upper arm   3. Stiffness of right shoulder, not elsewhere classified   4. Abnormal posture        Problem List Patient Active Problem List   Diagnosis Date Noted  . Wound infection after surgery 11/28/2018  . Breast cancer ([place] [place]) 11/26/2018  . Postoperative wound infection 11/26/2018  . Breast cancer of upper-outer quadrant of right female breast ([place]) 11/06/2018  . Genetic testing 08/20/2018  . 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 Dehydration 07/24/2018  . Nausea and vomiting 07/24/2018  . Nausea without vomiting 07/24/2018  . Family history of breast cancer   . Family history of colon cancer   . Family history of bladder cancer   . Family history of pancreatic cancer   . Port-A-Cath in place 07/16/2018  . Malignant neoplasm of upper-outer quadrant of right breast in female, estrogen receptor negative ([place]) 06/29/2018  . 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 Macromastia 11/19/2013  . Breast mass, right 10/20/2013  . Abnormal Pap smear 01/01/2012   Donato Heinz. Owens Shark PT  Norwood Levo 01/25/2019, 3:50 PM  [place], [place], [address] Phone: [phone]   Fax:  [phone]  Name: Holly Wiley MRN: [id number] Date of Birth: 07-Oct-1975

## 2019-01-27 ENCOUNTER — Telehealth: Payer: Self-pay | Admitting: Adult Health

## 2019-01-27 NOTE — Telephone Encounter (Signed)
I talk with patient regarding video visit °

## 2019-01-28 ENCOUNTER — Encounter: Payer: Self-pay | Admitting: Physical Therapy

## 2019-01-28 ENCOUNTER — Ambulatory Visit: Payer: BC Managed Care – PPO | Admitting: Physical Therapy

## 2019-01-28 ENCOUNTER — Other Ambulatory Visit: Payer: Self-pay

## 2019-01-28 DIAGNOSIS — M25611 Stiffness of right shoulder, not elsewhere classified: Secondary | ICD-10-CM

## 2019-01-28 DIAGNOSIS — M79621 Pain in right upper arm: Secondary | ICD-10-CM

## 2019-01-28 DIAGNOSIS — Z483 Aftercare following surgery for neoplasm: Secondary | ICD-10-CM | POA: Diagnosis not present

## 2019-01-28 DIAGNOSIS — R293 Abnormal posture: Secondary | ICD-10-CM

## 2019-01-28 NOTE — Therapy (Addendum)
[place], [place], [address] Phone: [phone]   Fax:  [phone]  Physical Therapy Treatment  Patient Details  Name: Holly Wiley MRN: [id number] Date of Birth: [birth year] Referring Provider (PT): Dr. Fanny Skates   Encounter Date: 01/28/2019  PT End of Session - 01/28/19 1333    Visit Number  9    Number of Visits  9    Date for PT Re-Evaluation  02/08/19    PT Start Time  1230    PT Stop Time  1325    PT Time Calculation (min)  55 min    Activity Tolerance  Patient tolerated treatment well    Behavior During Therapy  [facility] for tasks assessed/performed       Past Medical History:  Diagnosis Date  . Breast cancer ([place]) 07/2018   right  . Dental crown present   . Family history of bladder cancer   . Family history of breast cancer   . Family history of colon cancer   . Family history of pancreatic cancer   . 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  . Migraines   . PONV (postoperative nausea and vomiting)     Past Surgical History:  Procedure Laterality Date  . BILATERAL TOTAL MASTECTOMY WITH AXILLARY LYMPH NODE DISSECTION N/A 11/06/2018   Procedure: BILATERAL TOTAL MASTECTOMY WITH AXILLARY LYMPH NODE DISSECTION;  Surgeon: Fanny Skates, MD;  Location: [place];  Service: General;  Laterality: N/A;  . BREAST BIOPSY Right 11/08/2013   Procedure: EXCISION OF RIGHT  BREAST MASS;  Surgeon: Adin Hector, MD;  Location: [place];  Service: General;  Laterality: Right;  . BUNIONECTOMY Left   . CARPAL TUNNEL RELEASE Right 02/26/2017  . CERVICAL CONE BIOPSY  05/2005  . COLONOSCOPY  2014  . DRESSING CHANGE UNDER ANESTHESIA Right 11/29/2018   Procedure: DRESSING CHANGE UNDER ANESTHESIA;  Surgeon: Fanny Skates, MD;  Location: [place];  Service: General;  Laterality: Right;  . IRRIGATION AND DEBRIDEMENT ABSCESS Right 11/28/2018   Procedure:  Exploration and Drainage of Right Mastectomy wound.;  Surgeon: Fanny Skates, MD;  Location: [place];  Service: General;  Laterality: Right;  . PORT-A-CATH REMOVAL N/A 11/06/2018   Procedure: Removal Port-A-Cath;  Surgeon: Fanny Skates, MD;  Location: [place];  Service: General;  Laterality: N/A;  . PORTACATH PLACEMENT Right 07/08/2018   Procedure: INSERTION PORT-A-CATH;  Surgeon: Fanny Skates, MD;  Location: [place];  Service: General;  Laterality: Right;  . SHOULDER ARTHROSCOPY W/ LABRAL REPAIR Right   . SHOULDER ARTHROSCOPY W/ ROTATOR CUFF REPAIR Left   . TENOTOMY FOREARM / WRIST Right 02/26/2017    There were no vitals filed for this visit.  Subjective Assessment - 01/28/19 1235    Subjective  " I am miserable beyond what I can tell anybody"  She still does not have an appointment for the nerve block.  She says she is worried about the fullness under her right arm She had her second massage and said that the therapist tried to "move the fluid"  but pt does not think the massage therapist is a CLT. She cannot say that she feels better after the massages.  She says she feels like all she does is exercises all day every day She cleaned out her car with the shop vac and wiped all the windows.  She does not feel that her arm got worse, it just stays bad all  the time.    Pertinent History  Neoadjuvant chemo for right breast cancer 07/09/2018-10/01/2018. Bilateral mastectomy 11/06/2018 with right sentinel node biopsy.            LYMPHEDEMA/ONCOLOGY QUESTIONNAIRE - 01/28/19 1239      Right Upper Extremity Lymphedema   15 cm Proximal to Olecranon Process  35.5 cm    10 cm Proximal to Olecranon Process  32.5 cm    Olecranon Process  26 cm    15 cm Proximal to Ulnar Styloid Process  25.5 cm    10 cm Proximal to Ulnar Styloid Process  22.5 cm    Just Proximal to Ulnar Styloid Process  15.5 cm    Across Hand at [employer]  19 cm    At [place] of 2nd Digit  6 cm                   [facility] Adult PT Treatment/Exercise - 01/28/19 0001      Manual Therapy   Manual Therapy  Edema management;Manual Lymphatic Drainage (MLD)    Manual therapy comments  took photos of back     Edema Management  remeasured right arm and pt did not have any increases.  She does have fullness in right lateral chest and back that is not easily measured.  She tried using the peach lined foam given last session,but did not find it to be helpful. Pt seems to have collection of swelling above elastic band of the lightweight bra she is wearing.  Send a request to Bary Castilla to send a script to second to Petra Kuba and showed pt options of [place] but suggested to her that she get a compression cami to have a smooth line down the back     Manual Lymphatic Drainage (MLD)  in supine, short neck superficial and deep abdominals. to sidleying for posterior interaxillay anastamosis and back.                PT Short Term Goals - 01/14/19 1244      PT SHORT TERM GOAL #1   Title  Patient will be independent with initial home exercise program.    Baseline  Pt independent with streches thus far and has been incorporating other stetches into ADLs-01/08/19    Status  Achieved      PT SHORT TERM GOAL #2   Title  Patient will increase right shoulder active flexion to >/= 120 degrees for increased ease reaching overhead.    Baseline  149 degrees-01/08/19    Status  Achieved      PT SHORT TERM GOAL #3   Title  Patient will increase right shoulder active abduction to >/= 120 degrees for increased ease reaching overhead.    Status  Achieved      PT SHORT TERM GOAL #4   Title  Patient will verbalize good understanding of lymphedema risk reduction practices.    Status  Achieved        PT Long Term Goals - 01/14/19 1245      PT LONG TERM GOAL #1   Title  Patient will be able to demonstrate independence with her home exercise program for shoulder ROM.    Status  Achieved       PT LONG TERM GOAL #2   Title  Patient will increase right shoulder active flexion to >/= 140 degrees for increased ease reaching overhead.    Baseline  135 on 7/16    Time  8    Period  Weeks    Status  On-going      PT LONG TERM GOAL #3   Title  Patient will increase right shoulder active abduction to >/= 140 degrees for increased ease reaching overhead.    Baseline  145 on 7/16    Time  8    Period  Weeks    Status  Achieved      PT LONG TERM GOAL #4   Title  Patient will verbalize pain has reduced by >/= 40% since eval to tolerate daily tasks with greater ease.    Baseline  pt is still having nerve pain    Status  On-going      PT LONG TERM GOAL #5   Title  Improve DASH score to </= 20 for imporved overall shoulder function.    Time  8    Status  On-going            Plan - 01/28/19 1334    Clinical Impression Statement  Pt upset about fullness in her back, so focused on that today. Pt will get a compression cami and possible swell spot, ( gave refernce to compression guru to look at options) She says she is doing lots of exercises at home.  Asked her to think about exercising in blocks of time that would be closer to her job duities to prepare to go back to work    Comorbidities  Recent infection    PT Treatment/Interventions  ADLs/Self Care Home Management;Therapeutic activities;Therapeutic exercise;Manual lymph drainage;Manual techniques;Patient/family education;Scar mobilization;Passive range of motion    PT Next Visit Plan  Assess goals and renew.  Focus on MLD to right back with self managment techniqeus  Assess how pt did after strength ABC and if she used her TENS at home  continue if it was helpful. Cont to assess wound; cont PROM to right shoulder, pulleys, ball up wall, etc,consider closed chain activity deltoid strengthening. consider exercise in sidelying with emphasis on contol of       Patient will benefit from skilled therapeutic intervention in order  to improve the following deficits and impairments:  Decreased skin integrity, Pain, Impaired UE functional use, Increased fascial restricitons, Decreased strength, Decreased knowledge of precautions, Decreased scar mobility, Decreased range of motion, Postural dysfunction  Visit Diagnosis: 1. Aftercare following surgery for neoplasm   2. Pain in right upper arm   3. Stiffness of right shoulder, not elsewhere classified   4. Abnormal posture        Problem List Patient Active Problem List   Diagnosis Date Noted  . Wound infection after surgery 11/28/2018  . Breast cancer ([place]) 11/26/2018  . Postoperative wound infection 11/26/2018  . Breast cancer of upper-outer quadrant of right female breast ([place]) 11/06/2018  . Genetic testing 08/20/2018  . 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 Dehydration 07/24/2018  . Nausea and vomiting 07/24/2018  . Nausea without vomiting 07/24/2018  . Family history of breast cancer   . Family history of colon cancer   . Family history of bladder cancer   . Family history of pancreatic cancer   . Port-A-Cath in place 07/16/2018  . Malignant neoplasm of upper-outer quadrant of right breast in female, estrogen receptor negative ([place]) 06/29/2018  . 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 Macromastia 11/19/2013  . Breast mass, right 10/20/2013  . Abnormal Pap smear 01/01/2012   Donato Heinz. Owens Shark, PT  [place]wood Levo 01/28/2019, 1:37 PM  [place], [place], [address] Phone: [phone]   Fax:  [phone]  Name: Holly Wiley MRN: [id number] Date of Birth: October 12, 1975

## 2019-02-02 NOTE — Op Note (Signed)
  Addition to Operative Note  I assisted in bilateral mastectomies with markings for skin reduction pattern mastectomies, retraction, and closure wounds.   Please see Dr. Darrel Hoover Operative Note for main procedure.  Patient was marked for skin reduction mastectomy with most superior portion nipple areola marked on breast meridian. Vertical limbs marked by breast displacement and set at11cm length. In supine position, the lateral limbs and inframammary fold for resection marked.  Skin closure completedwith 3-0 vicryl in dermis and fascial layer, 4-0 monocryl subcuticular and tissue adhesive. Tegaderms applied bilateral, followed by dry dressing and breast binder.  Irene Limbo, MD [facility] [phone], pin [phone]

## 2019-02-18 ENCOUNTER — Encounter: Payer: Self-pay | Admitting: Adult Health

## 2019-02-18 ENCOUNTER — Inpatient Hospital Stay: Payer: BC Managed Care – PPO | Attending: Adult Health | Admitting: Adult Health

## 2019-02-18 DIAGNOSIS — C50411 Malignant neoplasm of upper-outer quadrant of right female breast: Secondary | ICD-10-CM | POA: Diagnosis not present

## 2019-02-18 DIAGNOSIS — Z171 Estrogen receptor negative status [ER-]: Secondary | ICD-10-CM | POA: Diagnosis not present

## 2019-02-18 MED ORDER — DULOXETINE HCL 30 MG PO CPEP: 30.0000 mg | ORAL_CAPSULE | Freq: Every day | ORAL | 0 refills | Status: DC

## 2019-02-18 NOTE — Progress Notes (Signed)
SURVIVORSHIP VIRTUAL VISIT:  I connected with Holly Wiley on 02/18/19 at  2:00 PM EDT by my chart video and verified that I am speaking with the correct person using two identifiers.   I discussed the limitations, risks, security and privacy concerns of performing an evaluation and management service virtually and the availability of in person appointments. I also discussed with the patient that there may be a patient responsible charge related to this service. The patient expressed understanding and agreed to proceed.   BRIEF ONCOLOGIC HISTORY:  Oncology History  Malignant neoplasm of upper-outer quadrant of right breast in female, estrogen receptor negative ([place])  06/22/2018 Initial Diagnosis   Screening mammogram detected right breast mass 1.1 cm at 10 o'clock position right breast 12 cm from the nipple, no abnormal lymph nodes, biopsy of the mass revealed IDC with DCIS grade 3, ER 0%, PR 0%, HER-2 -1+ by IHC, Ki-67 40%, T1c N0 stage Ib   06/29/2018 Cancer Staging   Staging form: Breast, AJCC 8th Edition - Clinical stage from 06/29/2018: Stage IB (cT1c, cN0, cM0, G3, ER-, PR-, HER2-) - Signed by Nicholas Lose, MD on 06/29/2018   07/09/2018 - 10/01/2018 Neo-Adjuvant Chemotherapy   Neo- Adjuvant chemotherapy with dose dense Adriamycin and Cytoxan x4 followed by Taxol and carboplatin weekly x5 (cycles 6-12 canceled due to extreme fatigue)   07/23/2018 Genetic Testing   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  A variant of uncertain significance (VUS) in a gene called APC was also noted. (c.7105C>T). Genes tested: AIP, ALK, APC*, ATM*, AXIN2, BAP1, BARD1, BLM, BMPR1A, BRCA1*, BRCA2*, BRIP1*, CDC73, CDH1*, CDK4, CDKN1B, CDKN2A, CHEK2*, CTNNA1, DICER1, FANCC, FH, FLCN, GALNT12, HOXB13, KIT, MAX, MEN1, MET, MLH1*, MRE11A, MSH2*, MSH6*, MUTYH*, NBN, NF1*, NF2, NTHL1, PALB2*, PDGFRA, PHOX2B,  PMS2*, POLD1, POLE, POT1, PRKAR1A, PTCH1, PTEN*, RAD50, RAD51C*, RAD51D*, RB1, RET, SDHA, SDHAF2, SDHB, SDHC, SDHD, SMAD4, SMARCA4, SMARCB1, SMARCE1, STK11, SUFU, TMEM127, TP53*, TSC1, TSC2, VHL and XRCC2 (sequencing and deletion/duplication); CASR, CFTR, CPA1, CTRC, EGFR, MITF, PRSS1 and SPINK1 (sequencing only); EPCAM and GREM1 (deletion/duplication only). DNA and RNA analyses performed for * genes.   11/06/2018 Surgery   Left mastectomy: Complete response, 0/3 lymph nodes negative   11/06/2018 Cancer Staging   Staging form: Breast, AJCC 8th Edition - Pathologic stage from 11/06/2018: No Stage Recommended (ypT0, pN0, cM0) - Signed by Gardenia Phlegm, NP on 01/25/2019     INTERVAL HISTORY:  Holly Wiley to review her survivorship care plan detailing her treatment course for breast cancer, as well as monitoring long-term side effects of that treatment, education regarding health maintenance, screening, and overall wellness and health promotion.     Overall, Holly Wiley reports feeling poorly.  She says that she feels like a wreck.  She notes her wound infection from her surgery just healed.  She says that she has burning pain down her posterior right upper arm and that she hs been on gabapentin, taken oxycodone, and most recently went for a nerve block without relief.  She notes she is depressed, anxious, and having difficulty sleeping.  She is crying more, and says she feels on edge frequently.   REVIEW OF SYSTEMS:  Review of Systems  Constitutional: Positive for fatigue. Negative for appetite change, chills and unexpected weight change.  HENT:   Negative for hearing loss, lump/mass and trouble swallowing.   Eyes: Negative for eye problems and icterus.  Respiratory: Negative for chest tightness, cough and  shortness of breath.   Cardiovascular: Negative for chest pain, leg swelling and palpitations.  Gastrointestinal: Negative for abdominal distention, abdominal pain, constipation, diarrhea,  nausea and vomiting.  Endocrine: Negative for hot flashes.  Musculoskeletal: Negative for arthralgias.  Skin: Negative for itching and rash.  Neurological: Negative for dizziness, extremity weakness, headaches and numbness.  Hematological: Negative for adenopathy. Does not bruise/bleed easily.  Psychiatric/Behavioral: Positive for sleep disturbance. Negative for depression and suicidal ideas. The patient is nervous/anxious.   Breast: Denies any new nodularity, masses, tenderness, nipple changes, or nipple discharge.      ONCOLOGY TREATMENT TEAM:  1. Surgeon:  Dr. Dalbert Batman at [facility] 2. Medical Oncologist: Dr. Lindi Wiley      PAST MEDICAL/SURGICAL HISTORY:  Past Medical History:  Diagnosis Date  . Breast cancer ([place]) 07/2018   right  . Dental crown present   . Family history of bladder cancer   . Family history of breast cancer   . Family history of colon cancer   . Family history of pancreatic cancer   . 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  . Migraines   . PONV (postoperative nausea and vomiting)    Past Surgical History:  Procedure Laterality Date  . BILATERAL TOTAL MASTECTOMY WITH AXILLARY LYMPH NODE DISSECTION N/A 11/06/2018   Procedure: BILATERAL TOTAL MASTECTOMY WITH AXILLARY LYMPH NODE DISSECTION;  Surgeon: Fanny Skates, MD;  Location: [place];  Service: General;  Laterality: N/A;  . BREAST BIOPSY Right 11/08/2013   Procedure: EXCISION OF RIGHT  BREAST MASS;  Surgeon: Adin Hector, MD;  Location: [place];  Service: General;  Laterality: Right;  . BUNIONECTOMY Left   . CARPAL TUNNEL RELEASE Right 02/26/2017  . CERVICAL CONE BIOPSY  05/2005  . COLONOSCOPY  2014  . DRESSING CHANGE UNDER ANESTHESIA Right 11/29/2018   Procedure: DRESSING CHANGE UNDER ANESTHESIA;  Surgeon: Fanny Skates, MD;  Location: [place];  Service: General;  Laterality: Right;  . IRRIGATION AND DEBRIDEMENT ABSCESS Right 11/28/2018    Procedure: Exploration and Drainage of Right Mastectomy wound.;  Surgeon: Fanny Skates, MD;  Location: [place];  Service: General;  Laterality: Right;  . PORT-A-CATH REMOVAL N/A 11/06/2018   Procedure: Removal Port-A-Cath;  Surgeon: Fanny Skates, MD;  Location: [place];  Service: General;  Laterality: N/A;  . PORTACATH PLACEMENT Right 07/08/2018   Procedure: INSERTION PORT-A-CATH;  Surgeon: Fanny Skates, MD;  Location: [place];  Service: General;  Laterality: Right;  . SHOULDER ARTHROSCOPY W/ LABRAL REPAIR Right   . SHOULDER ARTHROSCOPY W/ ROTATOR CUFF REPAIR Left   . TENOTOMY FOREARM / WRIST Right 02/26/2017     ALLERGIES:  Allergies  Allergen Reactions  . Bee Venom Anaphylaxis  . Dihydroergotamine Anaphylaxis  . Imitrex [Sumatriptan] Other (See Comments)    SEIZURE-LIKE ACTIVITY  . Metoclopramide Other (See Comments)    TACHYCARDIA  . Transderm-Scop [Scopolamine] Other (See Comments)    AGITATION     CURRENT MEDICATIONS:  Outpatient Encounter Medications as of 02/18/2019  Medication Sig Note  . gabapentin (NEURONTIN) 600 MG tablet Take 900 mg by mouth 4 (four) times daily. 01/08/2019: Pt reports taking 1800 mg now is good for   . methocarbamol (ROBAXIN) 500 MG tablet Take 1 tablet (500 mg total) by mouth every 6 (six) hours as needed for muscle spasms. (Patient not taking: Reported on 12/14/2018)   . Multiple Vitamin (MULTIVITAMIN WITH MINERALS) TABS tablet Take 1 tablet by mouth daily.   . mupirocin  ointment (BACTROBAN) 2 % Place into the nose 2 (two) times daily. (Patient not taking: Reported on 12/14/2018)   . ondansetron (ZOFRAN-ODT) 4 MG disintegrating tablet Take 1 tablet (4 mg total) by mouth every 6 (six) hours as needed for nausea. (Patient not taking: Reported on 01/08/2019)   . ondansetron (ZOFRAN-ODT) 4 MG disintegrating tablet Take 1 tablet (4 mg total) by mouth every 6 (six) hours as needed for nausea. (Patient not taking: Reported on 01/08/2019)   .  oxyCODONE (OXY IR/ROXICODONE) 5 MG immediate release tablet Take 1 tablet (5 mg total) by mouth every 4 (four) hours as needed for moderate pain. (Patient not taking: Reported on 12/14/2018)   . senna-docusate (SENOKOT-S) 8.6-50 MG tablet Take 1 tablet by mouth 2 (two) times daily. (Patient not taking: Reported on 01/08/2019)   . sulfamethoxazole-trimethoprim (BACTRIM DS) 800-160 MG tablet Take 1 tablet by mouth every 12 (twelve) hours. (Patient not taking: Reported on 01/08/2019)   . traMADol (ULTRAM) 50 MG tablet Take 1-2 tablets (50-100 mg total) by mouth every 6 (six) hours as needed for moderate pain. (Patient not taking: Reported on 12/14/2018)    Facility-Administered Encounter Medications as of 02/18/2019  Medication  . sodium chloride flush (NS) 0.9 % injection 10 mL     ONCOLOGIC FAMILY HISTORY:  Family History  Problem Relation Age of Onset  . Breast cancer Mother 50       triple negative, GT neg  . Diabetes Maternal Aunt   . Bladder Cancer Maternal Aunt 69  . Colon cancer Maternal Uncle 12  . Diabetes Maternal Uncle   . Diabetes Maternal Grandmother   . Stroke Paternal Grandfather   . Colon cancer Other 24  . Breast cancer Other        dx >50  . Colon cancer Other        dx>50  . Pancreatic cancer Other      GENETIC COUNSELING/TESTING: See above  SOCIAL HISTORY:  Social History   Socioeconomic History  . Marital status: Divorced    Spouse name: Not on file  . Number of children: Not on file  . Years of education: Not on file  . Highest education level: Not on file  Occupational History  . Not on file  Social Needs  . Financial resource strain: Not on file  . Food insecurity    Worry: Not on file    Inability: Not on file  . Transportation needs    Medical: Not on file    Non-medical: Not on file  Tobacco Use  . Smoking status: Never Smoker  . Smokeless tobacco: Never Used  Substance and Sexual Activity  . Alcohol use: Yes    Comment: occasionally  .  Drug use: No  . Sexual activity: Not Currently  Lifestyle  . Physical activity    Days per week: Not on file    Minutes per session: Not on file  . Stress: Not on file  Relationships  . Social Herbalist on phone: Not on file    Gets together: Not on file    Attends religious service: Not on file    Active member of club or organization: Not on file    Attends meetings of clubs or organizations: Not on file    Relationship status: Not on file  . Intimate partner violence    Fear of current or ex partner: Not on file    Emotionally abused: Not on file    Physically abused:  Not on file    Forced sexual activity: Not on file  Other Topics Concern  . Not on file  Social History Narrative  . Not on file     OBSERVATIONS/OBJECTIVE:   Patient is in no acute distress.  Tearful, appears anxious.  Breathing is non labored.  Behavior is normal. Skin visualized is normal  LABORATORY DATA:  None for this visit.  DIAGNOSTIC IMAGING:  None for this visit.      ASSESSMENT AND PLAN:  Ms.. Wiley is a pleasant 42 y.o. female with Stage IB right breast invasive ductal carcinoma, ER-/PR-/HER2-, diagnosed in 05/2018, treated with neoadjuvant chemotherapy, and mastectomy.  She presents to the Survivorship Clinic for our initial meeting and routine follow-up post-completion of treatment for breast cancer.    1. Stage IB right breast cancer:  Holly Wiley is continuing to recover from definitive treatment for breast cancer. She will follow-up with her medical oncologist, Dr. Lindi Wiley in in 4 weeks.  Today, a comprehensive survivorship care plan and treatment summary was reviewed with the patient today detailing her breast cancer diagnosis, treatment course, potential late/long-term effects of treatment, appropriate follow-up care with recommendations for the future, and patient education resources.  A copy of this summary, along with a letter will be sent to the patient's primary care  provider via mail/fax/In Basket message after today's visit.    2. Anxiety/Depression/Nerve Pain: Roby is very anxious and upset.  I think that if we can decrease her anxiety and depression, along with potentially the pain, it may help.  I recommended she start Cymbalta 31m daily x 1 week then increase to BID.  I gave her detailed info about this in her AVS.  She is not suicidal.  We will f/u with her about this closely and she will call u[place] she has any issues of this.   3. Cancer screening:  Due to Holly Wiley history and her age, she should receive screening for skin cancers, and gynecologic cancers.  The information and recommendations are listed on the patient's comprehensive care plan/treatment summary and were reviewed in detail with the patient.    4. Health maintenance and wellness promotion: Ms. DCozadwas encouraged to consume 5-7 servings of fruits and vegetables per day. We reviewed the "Nutrition Rainbow" handout, as well as the handout "Take Control of Your Health and Reduce Your Cancer Risk" from the [facility]  She was also encouraged to engage in moderate to vigorous exercise for 30 minutes per day most days of the week. We discussed the [employer] fitness program, which is designed for cancer survivors to help them become more physically fit after cancer treatments.  She was instructed to limit her alcohol consumption and continue to abstain from tobacco use.     5. Support services/counseling: It is not uncommon for this period of the patient's cancer care trajectory to be one of many emotions and stressors.  We discussed how this can be increasingly difficult during the times of quarantine and social distancing due to the COVID-19 pandemic.   She was given information regarding our available services and encouraged to contact me with any questions or for help enrolling in any of our support group/programs.    Follow up instructions:    -Return to cancer  center in 4 weeks for my chart video f/u with myself or Dr. GLindi Wiley -She is welcome to return back to the Survivorship Clinic at any time; no additional follow-up needed at this time.  -  Consider referral back to survivorship as a long-term survivor for continued surveillance  The patient was provided an opportunity to ask questions and all were answered. The patient agreed with the plan and demonstrated an understanding of the instructions.   The patient was advised to call back or seek an in-person evaluation if the symptoms worsen or if the condition fails to improve as anticipated.   I provided 40 minutes of face-to-face video visit time during this encounter, and > 50% was spent counseling as documented under my assessment & plan.  Scot Dock, NP

## 2019-02-18 NOTE — Patient Instructions (Signed)
Duloxetine delayed-release capsules What is this medicine? DULOXETINE (doo LOX e teen) is used to treat depression, anxiety, and different types of chronic pain. This medicine may be used for other purposes; ask your health care provider or pharmacist if you have questions. COMMON BRAND NAME(S): Cymbalta, Drizalma, Irenka What should I tell my health care provider before I take this medicine? They need to know if you have any of these conditions:  bipolar disorder  glaucoma  high blood pressure  kidney disease  liver disease  seizures  suicidal thoughts, plans or attempt; a previous suicide attempt by you or a family member  take medicines that treat or prevent blood clots  taken medicines called MAOIs like Carbex, Eldepryl, Marplan, Nardil, and Parnate within 14 days  trouble passing urine  an unusual reaction to duloxetine, other medicines, foods, dyes, or preservatives  pregnant or trying to get pregnant  breast-feeding How should I use this medicine? Take this medicine by mouth with a glass of water. Follow the directions on the prescription label. Do not crush, cut or chew some capsules of this medicine. Some capsules may be opened and sprinkled on applesauce. Check with your doctor or pharmacist if you are not sure. You can take this medicine with or without food. Take your medicine at regular intervals. Do not take your medicine more often than directed. Do not stop taking this medicine suddenly except upon the advice of your doctor. Stopping this medicine too quickly may cause serious side effects or your condition may worsen. A special MedGuide will be given to you by the pharmacist with each prescription and refill. Be sure to read this information carefully each time. Talk to your pediatrician regarding the use of this medicine in children. While this drug may be prescribed for children as young as 7 years of age for selected conditions, precautions do  apply. Overdosage: If you think you have taken too much of this medicine contact a poison control center or emergency room at once. NOTE: This medicine is only for you. Do not share this medicine with others. What if I miss a dose? If you miss a dose, take it as soon as you can. If it is almost time for your next dose, take only that dose. Do not take double or extra doses. What may interact with this medicine? Do not take this medicine with any of the following medications:  desvenlafaxine  levomilnacipran  linezolid  MAOIs like Carbex, Eldepryl, Marplan, Nardil, and Parnate  methylene blue (injected into a vein)  milnacipran  thioridazine  venlafaxine This medicine may also interact with the following medications:  alcohol  amphetamines  aspirin and aspirin-like medicines  certain antibiotics like ciprofloxacin and enoxacin  certain medicines for blood pressure, heart disease, irregular heart beat  certain medicines for depression, anxiety, or psychotic disturbances  certain medicines for migraine headache like almotriptan, eletriptan, frovatriptan, naratriptan, rizatriptan, sumatriptan, zolmitriptan  certain medicines that treat or prevent blood clots like warfarin, enoxaparin, and dalteparin  cimetidine  fentanyl  lithium  NSAIDS, medicines for pain and inflammation, like ibuprofen or naproxen  phentermine  procarbazine  rasagiline  sibutramine  St. John's wort  theophylline  tramadol  tryptophan This list may not describe all possible interactions. Give your health care provider a list of all the medicines, herbs, non-prescription drugs, or dietary supplements you use. Also tell them if you smoke, drink alcohol, or use illegal drugs. Some items may interact with your medicine. What should I watch for while   using this medicine? Tell your doctor if your symptoms do not get better or if they get worse. Visit your doctor or healthcare provider for  regular checks on your progress. Because it may take several weeks to see the full effects of this medicine, it is important to continue your treatment as prescribed by your doctor. This medicine may cause serious skin reactions. They can happen weeks to months after starting the medicine. Contact your healthcare provider right away if you notice fevers or flu-like symptoms with a rash. The rash may be red or purple and then turn into blisters or peeling of the skin. Or, you might notice a red rash with swelling of the face, lips, or lymph nodes in your neck or under your arms. Patients and their families should watch out for new or worsening thoughts of suicide or depression. Also watch out for sudden changes in feelings such as feeling anxious, agitated, panicky, irritable, hostile, aggressive, impulsive, severely restless, overly excited and hyperactive, or not being able to sleep. If this happens, especially at the beginning of treatment or after a change in dose, call your healthcare provider. You may get drowsy or dizzy. Do not drive, use machinery, or do anything that needs mental alertness until you know how this medicine affects you. Do not stand or sit up quickly, especially if you are an older patient. This reduces the risk of dizzy or fainting spells. Alcohol may interfere with the effect of this medicine. Avoid alcoholic drinks. This medicine can cause an increase in blood pressure. This medicine can also cause a sudden drop in your blood pressure, which may make you feel faint and increase the chance of a fall. These effects are most common when you first start the medicine or when the dose is increased, or during use of other medicines that can cause a sudden drop in blood pressure. Check with your doctor for instructions on monitoring your blood pressure while taking this medicine. Your mouth may get dry. Chewing sugarless gum or sucking hard candy, and drinking plenty of water, may help.  Contact your doctor if the problem does not go away or is severe. What side effects may I notice from receiving this medicine? Side effects that you should report to your doctor or health care professional as soon as possible:  allergic reactions like skin rash, itching or hives, swelling of the face, lips, or tongue  anxious  breathing problems  confusion  changes in vision  chest pain  confusion  elevated mood, decreased need for sleep, racing thoughts, impulsive behavior  eye pain  fast, irregular heartbeat  feeling faint or lightheaded, falls  feeling agitated, angry, or irritable  hallucination, loss of contact with reality  high blood pressure  loss of balance or coordination  palpitations  redness, blistering, peeling or loosening of the skin, including inside the mouth  restlessness, pacing, inability to keep still  seizures  stiff muscles  suicidal thoughts or other mood changes  trouble passing urine or change in the amount of urine  trouble sleeping  unusual bleeding or bruising  unusually weak or tired  vomiting  yellowing of the eyes or skin Side effects that usually do not require medical attention (report to your doctor or health care professional if they continue or are bothersome):  change in sex drive or performance  change in appetite or weight  constipation  dizziness  dry mouth  headache  increased sweating  nausea  tired This list may not describe   all possible side effects. Call your doctor for medical advice about side effects. You may report side effects to FDA at 1-800-FDA-1088. Where should I keep my medicine? Keep out of the reach of children. Store at room temperature between 15 and 30 degrees C (59 to 86 degrees F). Throw away any unused medicine after the expiration date. NOTE: This sheet is a summary. It may not cover all possible information. If you have questions about this medicine, talk to your doctor,  pharmacist, or health care provider.  2020 Elsevier/Gold Standard (2018-09-17 13:47:50)  

## 2019-02-25 ENCOUNTER — Encounter: Payer: Self-pay | Admitting: Physical Therapy

## 2019-02-25 ENCOUNTER — Ambulatory Visit: Payer: BC Managed Care – PPO | Attending: General Surgery | Admitting: Physical Therapy

## 2019-02-25 ENCOUNTER — Other Ambulatory Visit: Payer: Self-pay

## 2019-02-25 DIAGNOSIS — M79621 Pain in right upper arm: Secondary | ICD-10-CM | POA: Insufficient documentation

## 2019-02-25 DIAGNOSIS — R293 Abnormal posture: Secondary | ICD-10-CM

## 2019-02-25 DIAGNOSIS — M25611 Stiffness of right shoulder, not elsewhere classified: Secondary | ICD-10-CM

## 2019-02-25 DIAGNOSIS — Z483 Aftercare following surgery for neoplasm: Secondary | ICD-10-CM | POA: Diagnosis present

## 2019-02-25 NOTE — Therapy (Signed)
[place], [place], [address] Phone: [phone]   Fax:  [phone]  Physical Therapy Treatment  Patient Details  Name: Holly Wiley MRN: [id number] Date of Birth: [birth year] Referring Provider (PT): Dr. Fanny Skates   Encounter Date: 02/25/2019  PT End of Session - 02/25/19 1421    Visit Number  10    Number of Visits  17    Date for PT Re-Evaluation  04/02/19    PT Start Time  1330    PT Stop Time  1415    PT Time Calculation (min)  45 min    Activity Tolerance  Patient tolerated treatment well    Behavior During Therapy  [facility] for tasks assessed/performed       Past Medical History:  Diagnosis Date  . Breast cancer ([place]) 07/2018   right  . Dental crown present   . Family history of bladder cancer   . Family history of breast cancer   . Family history of colon cancer   . Family history of pancreatic cancer   . 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  . Migraines   . PONV (postoperative nausea and vomiting)     Past Surgical History:  Procedure Laterality Date  . BILATERAL TOTAL MASTECTOMY WITH AXILLARY LYMPH NODE DISSECTION N/A 11/06/2018   Procedure: BILATERAL TOTAL MASTECTOMY WITH AXILLARY LYMPH NODE DISSECTION;  Surgeon: Fanny Skates, MD;  Location: [place];  Service: General;  Laterality: N/A;  . BREAST BIOPSY Right 11/08/2013   Procedure: EXCISION OF RIGHT  BREAST MASS;  Surgeon: Adin Hector, MD;  Location: [place];  Service: General;  Laterality: Right;  . BUNIONECTOMY Left   . CARPAL TUNNEL RELEASE Right 02/26/2017  . CERVICAL CONE BIOPSY  05/2005  . COLONOSCOPY  2014  . DRESSING CHANGE UNDER ANESTHESIA Right 11/29/2018   Procedure: DRESSING CHANGE UNDER ANESTHESIA;  Surgeon: Fanny Skates, MD;  Location: [place];  Service: General;  Laterality: Right;  . IRRIGATION AND DEBRIDEMENT ABSCESS Right 11/28/2018   Procedure:  Exploration and Drainage of Right Mastectomy wound.;  Surgeon: Fanny Skates, MD;  Location: [place];  Service: General;  Laterality: Right;  . PORT-A-CATH REMOVAL N/A 11/06/2018   Procedure: Removal Port-A-Cath;  Surgeon: Fanny Skates, MD;  Location: [place];  Service: General;  Laterality: N/A;  . PORTACATH PLACEMENT Right 07/08/2018   Procedure: INSERTION PORT-A-CATH;  Surgeon: Fanny Skates, MD;  Location: [place];  Service: General;  Laterality: Right;  . SHOULDER ARTHROSCOPY W/ LABRAL REPAIR Right   . SHOULDER ARTHROSCOPY W/ ROTATOR CUFF REPAIR Left   . TENOTOMY FOREARM / WRIST Right 02/26/2017    There were no vitals filed for this visit.  Subjective Assessment - 02/25/19 1327    Subjective  Pt has had 2 nerve blocks ( the latest was 2 days ago) she is still having pain in her arm.  She has been to [facility] times but has not been able to find one that wil work.  She cannot sleep. She is having trouble with eating and nerves. She has not picked up the Cymbalta yet She comes back today because of the swelling in her right sde.         [facility] PT Assessment - 02/25/19 0001      Assessment   Medical Diagnosis       Referring Provider (PT)  Dr. Fanny Skates    Onset Date/Surgical  Date  11/06/18      Prior Function   Level of Independence  Independent      Observation/Other Assessments   Observations  wound is nearly closed but she still has scant drainage from lateral portion       AROM   Right Shoulder Flexion  160 Degrees   in supine, 138   Right Shoulder ABduction  145 Degrees   in sidelying 125       LYMPHEDEMA/ONCOLOGY QUESTIONNAIRE - 02/25/19 1331      Right Upper Extremity Lymphedema   15 cm Proximal to Olecranon Process  36 cm    10 cm Proximal to Olecranon Process  33 cm    Olecranon Process  27 cm    15 cm Proximal to Ulnar Styloid Process  26.5 cm    10 cm Proximal to Ulnar Styloid Process  24.5 cm    Just Proximal to Ulnar  Styloid Process  16.2 cm    Across Hand at [employer]  19.5 cm    At [place] of 2nd Digit  6 cm      Left Upper Extremity Lymphedema   15 cm Proximal to Olecranon Process  35 cm    10 cm Proximal to Olecranon Process  32 cm    Olecranon Process  26.5 cm    15 cm Proximal to Ulnar Styloid Process  26.2 cm    10 cm Proximal to Ulnar Styloid Process  23 cm    Just Proximal to Ulnar Styloid Process  15.8 cm    Across Hand at [employer]  19 cm    At [place] of 2nd Digit  6 cm                [facility] Adult PT Treatment/Exercise - 02/25/19 0001      Shoulder Exercises: Stretch   Other Shoulder Stretches  supine dowel stretches in flexion with cues to go slowly and with breath. Attempted to do shoulder abduction but pt screamed out and squirmed away with pain     Other Shoulder Stretches  wall stretches      Manual Therapy   Manual Therapy  Edema management;Manual Lymphatic Drainage (MLD)    Edema Management  pt wants demographics sent to Flexitouch     Manual Lymphatic Drainage (MLD)  in supine, short neck superficial and deep abdominals. to sidleying for posterior interaxillay anastamosis and back.    pt still has pain in posterior upper arm               PT Short Term Goals - 01/14/19 1244      PT SHORT TERM GOAL #1   Title  Patient will be independent with initial home exercise program.    Baseline  Pt independent with streches thus far and has been incorporating other stetches into ADLs-01/08/19    Status  Achieved      PT SHORT TERM GOAL #2   Title  Patient will increase right shoulder active flexion to >/= 120 degrees for increased ease reaching overhead.    Baseline  149 degrees-01/08/19    Status  Achieved      PT SHORT TERM GOAL #3   Title  Patient will increase right shoulder active abduction to >/= 120 degrees for increased ease reaching overhead.    Status  Achieved      PT SHORT TERM GOAL #4   Title  Patient will verbalize good understanding of  lymphedema risk reduction practices.  Status  Achieved        PT Long Term Goals - 02/25/19 1426      PT LONG TERM GOAL #1   Title  Patient will be able to demonstrate independence with her home exercise program for shoulder ROM.    Time  8    Period  Weeks    Status  On-going      PT LONG TERM GOAL #2   Title  Patient will increase right shoulder active flexion to >/= 140 degrees for increased ease reaching overhead.    Baseline  135 on 7/16, 160 on 02/25/2019    Status  Achieved      PT LONG TERM GOAL #3   Title  Patient will increase right shoulder active abduction to >/= 140 degrees for increased ease reaching overhead.    Baseline  145 on 7/16, 145 on 02/25/2019    Status  Achieved      PT LONG TERM GOAL #4   Title  Patient will verbalize pain has reduced by >/= 40% since eval to tolerate daily tasks with greater ease.    Baseline  pt is still having nerve pain    Status  On-going      PT LONG TERM GOAL #5   Title  Improve DASH score to </= 20 for imporved overall shoulder function.    Period  Weeks    Status  On-going            Plan - 02/25/19 1422    Clinical Impression Statement  pt continues wtih severe nerve pain despite 2 attmepts at nerve blocks. She is concerned about swelling and does have increased arm circumference measurements today.  Will initiate Flexitouch. Pt has been unsuccessful in finding a compression bra or cami that she can tolerate. She is no longer using the TENS unit but states she is still doing all her exercises.  Despite this, she still has decreased ROM of shoulder and tightness in chest.  She needs to continue PT to address these problems    PT Treatment/Interventions  ADLs/Self Care Home Management;Therapeutic activities;Therapeutic exercise;Manual lymph drainage;Manual techniques;Patient/family education;Scar mobilization;Passive range of motion    PT Next Visit Plan  Assess goals and renew.  Focus on MLD tocont PROM to right shoulder,  pulleys, ball up wall, etc,consider closed chain activity deltoid strengthening. consider exercise in sidelying with trunk stretch MLD as indicated       Patient will benefit from skilled therapeutic intervention in order to improve the following deficits and impairments:  Decreased skin integrity, Pain, Impaired UE functional use, Increased fascial restricitons, Decreased strength, Decreased knowledge of precautions, Decreased scar mobility, Decreased range of motion, Postural dysfunction  Visit Diagnosis: Aftercare following surgery for neoplasm - Plan: PT plan of care cert/re-cert  Pain in right upper arm - Plan: PT plan of care cert/re-cert  Stiffness of right shoulder, not elsewhere classified - Plan: PT plan of care cert/re-cert  Abnormal posture - Plan: PT plan of care cert/re-cert     Problem List Patient Active Problem List   Diagnosis Date Noted  . Wound infection after surgery 11/28/2018  . Breast cancer ([place]) 11/26/2018  . Postoperative wound infection 11/26/2018  . Breast cancer of upper-outer quadrant of right female breast ([place]) 11/06/2018  . Genetic testing 08/20/2018  . 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 Dehydration 07/24/2018  . Nausea and vomiting 07/24/2018  . Nausea without vomiting 07/24/2018  . Family history of breast cancer   .  Family history of colon cancer   . Family history of bladder cancer   . Family history of pancreatic cancer   . Port-A-Cath in place 07/16/2018  . Malignant neoplasm of upper-outer quadrant of right breast in female, estrogen receptor negative ([place]) 06/29/2018  . 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 Macromastia 11/19/2013  . Breast mass, right 10/20/2013  . Abnormal Pap smear 01/01/2012   Donato Heinz. Owens Shark PT  Norwood Levo 02/25/2019, 4:29 PM  [place], [place], [address] Phone:  [phone]   Fax:  [phone]  Name: Holly Wiley MRN: [id number] Date of Birth: April 13, 1976

## 2019-03-04 ENCOUNTER — Ambulatory Visit: Payer: BC Managed Care – PPO | Admitting: Physical Therapy

## 2019-03-11 ENCOUNTER — Other Ambulatory Visit: Payer: Self-pay

## 2019-03-11 ENCOUNTER — Ambulatory Visit: Payer: BC Managed Care – PPO | Attending: General Surgery | Admitting: Rehabilitation

## 2019-03-11 ENCOUNTER — Encounter: Payer: Self-pay | Admitting: Rehabilitation

## 2019-03-11 DIAGNOSIS — M79621 Pain in right upper arm: Secondary | ICD-10-CM | POA: Insufficient documentation

## 2019-03-11 DIAGNOSIS — M25611 Stiffness of right shoulder, not elsewhere classified: Secondary | ICD-10-CM | POA: Insufficient documentation

## 2019-03-11 DIAGNOSIS — Z483 Aftercare following surgery for neoplasm: Secondary | ICD-10-CM | POA: Diagnosis present

## 2019-03-11 DIAGNOSIS — R293 Abnormal posture: Secondary | ICD-10-CM | POA: Diagnosis present

## 2019-03-11 NOTE — Therapy (Signed)
[place], [place], [address] Phone: [phone]   Fax:  [phone]  Physical Therapy Treatment  Patient Details  Name: Holly Wiley MRN: [id number] Date of Birth: March 02, 1976 Referring Provider (PT): Dr. Fanny Skates   Encounter Date: 03/11/2019  PT End of Session - 03/11/19 0934    Visit Number  11    Number of Visits  17    Date for PT Re-Evaluation  04/02/19    PT Start Time  0930    PT Stop Time  1013    PT Time Calculation (min)  43 min    Activity Tolerance  Patient tolerated treatment well    Behavior During Therapy  [facility] for tasks assessed/performed       Past Medical History:  Diagnosis Date  . Breast cancer ([place]) 07/2018   right  . Dental crown present   . Family history of bladder cancer   . Family history of breast cancer   . Family history of colon cancer   . Family history of pancreatic cancer   . 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  . Migraines   . PONV (postoperative nausea and vomiting)     Past Surgical History:  Procedure Laterality Date  . BILATERAL TOTAL MASTECTOMY WITH AXILLARY LYMPH NODE DISSECTION N/A 11/06/2018   Procedure: BILATERAL TOTAL MASTECTOMY WITH AXILLARY LYMPH NODE DISSECTION;  Surgeon: Fanny Skates, MD;  Location: [place];  Service: General;  Laterality: N/A;  . BREAST BIOPSY Right 11/08/2013   Procedure: EXCISION OF RIGHT  BREAST MASS;  Surgeon: Adin Hector, MD;  Location: [place];  Service: General;  Laterality: Right;  . BUNIONECTOMY Left   . CARPAL TUNNEL RELEASE Right 02/26/2017  . CERVICAL CONE BIOPSY  05/2005  . COLONOSCOPY  2014  . DRESSING CHANGE UNDER ANESTHESIA Right 11/29/2018   Procedure: DRESSING CHANGE UNDER ANESTHESIA;  Surgeon: Fanny Skates, MD;  Location: [place];  Service: General;  Laterality: Right;  . IRRIGATION AND DEBRIDEMENT ABSCESS Right 11/28/2018   Procedure:  Exploration and Drainage of Right Mastectomy wound.;  Surgeon: Fanny Skates, MD;  Location: [place];  Service: General;  Laterality: Right;  . PORT-A-CATH REMOVAL N/A 11/06/2018   Procedure: Removal Port-A-Cath;  Surgeon: Fanny Skates, MD;  Location: [place];  Service: General;  Laterality: N/A;  . PORTACATH PLACEMENT Right 07/08/2018   Procedure: INSERTION PORT-A-CATH;  Surgeon: Fanny Skates, MD;  Location: [place];  Service: General;  Laterality: Right;  . SHOULDER ARTHROSCOPY W/ LABRAL REPAIR Right   . SHOULDER ARTHROSCOPY W/ ROTATOR CUFF REPAIR Left   . TENOTOMY FOREARM / WRIST Right 02/26/2017    There were no vitals filed for this visit.  Subjective Assessment - 03/11/19 0929    Subjective  The nerve blocks made the pain worse.  I will be going to a pain specialist in [place] soon. I started my cymbalta. I heard from [place] it should be sent to my house.    Pertinent History  Neoadjuvant chemo for right breast cancer 07/09/2018-10/01/2018. Bilateral mastectomy 11/06/2018 with right sentinel node biopsy.    Patient Stated Goals  Decrease arm pain and restore shoulder ROM    Currently in Pain?  Yes    Pain Score  5     Pain Location  Arm    Pain Orientation  Left    Pain Descriptors / Indicators  Burning    Pain Type  Neuropathic pain  Pain Onset  More than a month ago    Pain Frequency  Constant                       [facility] Adult PT Treatment/Exercise - 03/11/19 0001      Manual Therapy   Manual therapy comments  discussed more bra and compression options.  Got scheduled with Melissa for Monday and sent prescription request to [facility]    Edema Management  emailed Vicente Males from [place] about in clinic demo    Manual Lymphatic Drainage (MLD)  in supine, short neck superficial and deep abdominals. Rt inguinal nodes, Rt axilloinguinal pathway with focus on the chest and then focus on Rt lateral trunk and then to sidleying for posterior interaxillay  anastamosis and back.                PT Short Term Goals - 01/14/19 1244      PT SHORT TERM GOAL #1   Title  Patient will be independent with initial home exercise program.    Baseline  Pt independent with streches thus far and has been incorporating other stetches into ADLs-01/08/19    Status  Achieved      PT SHORT TERM GOAL #2   Title  Patient will increase right shoulder active flexion to >/= 120 degrees for increased ease reaching overhead.    Baseline  149 degrees-01/08/19    Status  Achieved      PT SHORT TERM GOAL #3   Title  Patient will increase right shoulder active abduction to >/= 120 degrees for increased ease reaching overhead.    Status  Achieved      PT SHORT TERM GOAL #4   Title  Patient will verbalize good understanding of lymphedema risk reduction practices.    Status  Achieved        PT Long Term Goals - 03/11/19 1045      PT LONG TERM GOAL #1   Title  Patient will be able to demonstrate independence with her home exercise program for shoulder ROM.    Status  On-going      PT LONG TERM GOAL #2   Title  Patient will increase right shoulder active flexion to >/= 140 degrees for increased ease reaching overhead.    Status  On-going      PT LONG TERM GOAL #3   Title  Patient will increase right shoulder active abduction to >/= 140 degrees for increased ease reaching overhead.    Status  On-going      PT LONG TERM GOAL #4   Title  Patient will verbalize pain has reduced by >/= 40% since eval to tolerate daily tasks with greater ease.    Status  On-going      PT LONG TERM GOAL #5   Title  Improve DASH score to </= 20 for imporved overall shoulder function.    Status  On-going            Plan - 03/11/19 1027    Clinical Impression Statement  Pt will now be seeing a new pain MD and they think the pain may be getting worse from onset of lymphedema.  Continued MLD of the Rt trunk and started on the upper arm.  Emailed Derek about an in clinic  demo vs just sending unit home due to pain levels.  Pt will be getting sleeve [facility] measured on Monday and possibly bra.    PT Frequency  2x /  week    PT Duration  8 weeks    PT Treatment/Interventions  ADLs/Self Care Home Management;Therapeutic activities;Therapeutic exercise;Manual lymph drainage;Manual techniques;Patient/family education;Scar mobilization;Passive range of motion    PT Next Visit Plan  Focus on MLD to the Rt trunk and Rt arm until flexitouch arrives and pt receives good garments/bars.AROM and shoulder mobility as tolerated    Consulted and Agree with Plan of Care  Patient       Patient will benefit from skilled therapeutic intervention in order to improve the following deficits and impairments:     Visit Diagnosis: Aftercare following surgery for neoplasm  Pain in right upper arm  Stiffness of right shoulder, not elsewhere classified  Abnormal posture     Problem List Patient Active Problem List   Diagnosis Date Noted  . Wound infection after surgery 11/28/2018  . Breast cancer ([place]) 11/26/2018  . Postoperative wound infection 11/26/2018  . Breast cancer of upper-outer quadrant of right female breast ([place]) 11/06/2018  . Genetic testing 08/20/2018  . 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 Dehydration 07/24/2018  . Nausea and vomiting 07/24/2018  . Nausea without vomiting 07/24/2018  . Family history of breast cancer   . Family history of colon cancer   . Family history of bladder cancer   . Family history of pancreatic cancer   . Port-A-Cath in place 07/16/2018  . Malignant neoplasm of upper-outer quadrant of right breast in female, estrogen receptor negative ([place]) 06/29/2018  . 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 Macromastia 11/19/2013  . Breast mass, right 10/20/2013  . Abnormal Pap smear 01/01/2012    Stark Bray 03/11/2019, 10:47 AM  [place], [place], [address] Phone: [phone]   Fax:  [phone]  Name: Holly Wiley MRN: [id number] Date of Birth: 1975/11/05

## 2019-03-15 ENCOUNTER — Other Ambulatory Visit: Payer: Self-pay

## 2019-03-15 ENCOUNTER — Ambulatory Visit: Payer: BC Managed Care – PPO

## 2019-03-15 DIAGNOSIS — R293 Abnormal posture: Secondary | ICD-10-CM

## 2019-03-15 DIAGNOSIS — Z483 Aftercare following surgery for neoplasm: Secondary | ICD-10-CM

## 2019-03-15 DIAGNOSIS — M79621 Pain in right upper arm: Secondary | ICD-10-CM

## 2019-03-15 DIAGNOSIS — M25611 Stiffness of right shoulder, not elsewhere classified: Secondary | ICD-10-CM

## 2019-03-15 NOTE — Therapy (Addendum)
[place], [place], [address] Phone: [phone]   Fax:  [phone]  Physical Therapy Treatment  Patient Details  Name: Holly Wiley MRN: [id number] Date of Birth: May 02, 1976 Referring Provider (PT): Dr. Fanny Skates   Encounter Date: 03/15/2019  PT End of Session - 03/15/19 1723    Visit Number  12    Number of Visits  17    Date for PT Re-Evaluation  04/02/19    PT Start Time  1400    PT Stop Time  1455    PT Time Calculation (min)  55 min    Activity Tolerance  Patient tolerated treatment well    Behavior During Therapy  [facility] for tasks assessed/performed       Past Medical History:  Diagnosis Date  . Breast cancer ([place]) 07/2018   right  . Dental crown present   . Family history of bladder cancer   . Family history of breast cancer   . Family history of colon cancer   . Family history of pancreatic cancer   . 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  . Migraines   . PONV (postoperative nausea and vomiting)     Past Surgical History:  Procedure Laterality Date  . BILATERAL TOTAL MASTECTOMY WITH AXILLARY LYMPH NODE DISSECTION N/A 11/06/2018   Procedure: BILATERAL TOTAL MASTECTOMY WITH AXILLARY LYMPH NODE DISSECTION;  Surgeon: Fanny Skates, MD;  Location: [place];  Service: General;  Laterality: N/A;  . BREAST BIOPSY Right 11/08/2013   Procedure: EXCISION OF RIGHT  BREAST MASS;  Surgeon: Adin Hector, MD;  Location: [place];  Service: General;  Laterality: Right;  . BUNIONECTOMY Left   . CARPAL TUNNEL RELEASE Right 02/26/2017  . CERVICAL CONE BIOPSY  05/2005  . COLONOSCOPY  2014  . DRESSING CHANGE UNDER ANESTHESIA Right 11/29/2018   Procedure: DRESSING CHANGE UNDER ANESTHESIA;  Surgeon: Fanny Skates, MD;  Location: [place];  Service: General;  Laterality: Right;  . IRRIGATION AND DEBRIDEMENT ABSCESS Right 11/28/2018   Procedure:  Exploration and Drainage of Right Mastectomy wound.;  Surgeon: Fanny Skates, MD;  Location: [place];  Service: General;  Laterality: Right;  . PORT-A-CATH REMOVAL N/A 11/06/2018   Procedure: Removal Port-A-Cath;  Surgeon: Fanny Skates, MD;  Location: [place];  Service: General;  Laterality: N/A;  . PORTACATH PLACEMENT Right 07/08/2018   Procedure: INSERTION PORT-A-CATH;  Surgeon: Fanny Skates, MD;  Location: [place];  Service: General;  Laterality: Right;  . SHOULDER ARTHROSCOPY W/ LABRAL REPAIR Right   . SHOULDER ARTHROSCOPY W/ ROTATOR CUFF REPAIR Left   . TENOTOMY FOREARM / WRIST Right 02/26/2017    There were no vitals filed for this visit.  Subjective Assessment - 03/15/19 1359    Subjective  Pt reports that she still does not feel well. She states that she would like to get a script for compression sleeve and bra. She was measured by [facility] representative melissa this morning. She states that she has an appt with pain MD on Friday AM due to pain.    Pertinent History  Neoadjuvant chemo for right breast cancer 07/09/2018-10/01/2018. Bilateral mastectomy 11/06/2018 with right sentinel node biopsy.    Patient Stated Goals  Decrease arm pain and restore shoulder ROM    Currently in Pain?  Yes    Pain Score  6     Pain Location  Arm    Pain Onset  More than a  month ago                       [facility] Adult PT Treatment/Exercise - 03/15/19 0001      Manual Therapy   Myofascial Release  R scar myofascial release wtih a focus on the lateral R thorax to decrease tissue adhesions to improve fluid flow to decrease edema in this area and possible nerve adhesions.     Manual Lymphatic Drainage (MLD)  in supine and L S/L MLD performed with focus on the trunk toward inguinal lymph nodes with focus on R AI anastomosis. Work on Posterior superiorand inferior anastamosis w/increased focus on the inferior anastamosis.     Passive ROM  R shoulder PROM in flexion/abd/ER/IR with  firm end-feel w/decrease ROM slight stretch at end range to pt tolerance. pt reported no increase in pain.     Neural Stretch  with light end range with focus on humero-ulnar joint movement to decrease pain in the posterior brachial area w/o increased reports of pain.              PT Education - 03/15/19 1721    Education Details  Pt educated on the importance of movement in order to decrease adhesions over the R lateral thorax. Discussed scar car within PT scope of practice due to small red area in scar where two larger scars meet possibly due to moisture. Pt stated understanding of the importance of keeping this area clean. she also stated understanding of the importance of scar mobilization in order to help fluid flow from the R lateral trunk.    Person(s) Educated  Patient    Comprehension  Verbalized understanding       PT Short Term Goals - 01/14/19 1244      PT SHORT TERM GOAL #1   Title  Patient will be independent with initial home exercise program.    Baseline  Pt independent with streches thus far and has been incorporating other stetches into ADLs-01/08/19    Status  Achieved      PT SHORT TERM GOAL #2   Title  Patient will increase right shoulder active flexion to >/= 120 degrees for increased ease reaching overhead.    Baseline  149 degrees-01/08/19    Status  Achieved      PT SHORT TERM GOAL #3   Title  Patient will increase right shoulder active abduction to >/= 120 degrees for increased ease reaching overhead.    Status  Achieved      PT SHORT TERM GOAL #4   Title  Patient will verbalize good understanding of lymphedema risk reduction practices.    Status  Achieved        PT Long Term Goals - 03/11/19 1045      PT LONG TERM GOAL #1   Title  Patient will be able to demonstrate independence with her home exercise program for shoulder ROM.    Status  On-going      PT LONG TERM GOAL #2   Title  Patient will increase right shoulder active flexion to >/= 140  degrees for increased ease reaching overhead.    Status  On-going      PT LONG TERM GOAL #3   Title  Patient will increase right shoulder active abduction to >/= 140 degrees for increased ease reaching overhead.    Status  On-going      PT LONG TERM GOAL #4   Title  Patient will verbalize pain has  reduced by >/= 40% since eval to tolerate daily tasks with greater ease.    Status  On-going      PT LONG TERM GOAL #5   Title  Improve DASH score to </= 20 for imporved overall shoulder function.    Status  On-going            Plan - 03/15/19 1724    Clinical Impression Statement  Pt presents to physical thearpy today with pain continuing in her R posterior brachium. She continues with limited ROM in her R shoulder but was able to tolerate PROM well with light over pressure. Noted small red/slightly open area in the scar on her R anterior chest wall where two scars meet (care was discussed with pt) possibly due to moisture build up. Pt was measured today by Melisa for her sleeve.    Personal Factors and Comorbidities  Comorbidity 1    Comorbidities  pt has a lot of scar tissue in this area due to recent infection.    PT Frequency  2x / week    PT Duration  8 weeks    PT Treatment/Interventions  ADLs/Self Care Home Management;Therapeutic activities;Therapeutic exercise;Manual lymph drainage;Manual techniques;Patient/family education;Scar mobilization;Passive range of motion    PT Next Visit Plan  continue to focus on R scar adhesion with caution due to slight red/possibly open area due to moisture. Skin is cracked. Scar has healed fully. Go through [facility] exercises with pt in clinic to see her performance of these activities if time permits.    Consulted and Agree with Plan of Care  Patient       Patient will benefit from skilled therapeutic intervention in order to improve the following deficits and impairments:  Decreased skin integrity, Pain, Impaired UE functional use, Increased fascial  restricitons, Decreased strength, Decreased knowledge of precautions, Decreased scar mobility, Decreased range of motion, Postural dysfunction  Visit Diagnosis: Aftercare following surgery for neoplasm  Pain in right upper arm  Stiffness of right shoulder, not elsewhere classified  Abnormal posture     Problem List Patient Active Problem List   Diagnosis Date Noted  . Wound infection after surgery 11/28/2018  . Breast cancer ([place]) 11/26/2018  . Postoperative wound infection 11/26/2018  . Breast cancer of upper-outer quadrant of right female breast ([place]) 11/06/2018  . Genetic testing 08/20/2018  . 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 Dehydration 07/24/2018  . Nausea and vomiting 07/24/2018  . Nausea without vomiting 07/24/2018  . Family history of breast cancer   . Family history of colon cancer   . Family history of bladder cancer   . Family history of pancreatic cancer   . Port-A-Cath in place 07/16/2018  . Malignant neoplasm of upper-outer quadrant of right breast in female, estrogen receptor negative ([place]) 06/29/2018  . 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 Macromastia 11/19/2013  . Breast mass, right 10/20/2013  . Abnormal Pap smear 01/01/2012    Ander Purpura, PT 03/15/2019, 5:30 PM  [place], [place], [address] Phone: [phone]   Fax:  [phone]  Name: AJANAE FUENTE MRN: [id number] Date of Birth: 06-26-1976

## 2019-03-18 ENCOUNTER — Other Ambulatory Visit: Payer: Self-pay

## 2019-03-18 ENCOUNTER — Ambulatory Visit: Payer: BC Managed Care – PPO

## 2019-03-18 DIAGNOSIS — R293 Abnormal posture: Secondary | ICD-10-CM

## 2019-03-18 DIAGNOSIS — Z483 Aftercare following surgery for neoplasm: Secondary | ICD-10-CM | POA: Diagnosis not present

## 2019-03-18 DIAGNOSIS — M25611 Stiffness of right shoulder, not elsewhere classified: Secondary | ICD-10-CM

## 2019-03-18 DIAGNOSIS — M79621 Pain in right upper arm: Secondary | ICD-10-CM

## 2019-03-18 NOTE — Patient Instructions (Signed)
Pt instructed to perform radial nerve flossing at home to see if this doesn't help with symptoms in posterior brachium. Discussed progression of nerve desensitization including using silk followied by cotton sheets, cotton balls, sponge, hand during commercials while watching television to make sure that she is adequately stimulating nerves without an increase in symptoms.

## 2019-03-18 NOTE — Therapy (Signed)
[place], [place], [address] Phone: [phone]   Fax:  [phone]  Physical Therapy Treatment  Patient Details  Name: Holly Wiley MRN: [id number] Date of Birth: 21-Sep-1975 Referring Provider (PT): Dr. Fanny Skates   Encounter Date: 03/18/2019  PT End of Session - 03/18/19 1241    Visit Number  13    Number of Visits  17    Date for PT Re-Evaluation  04/02/19    PT Start Time  [id number]    PT Stop Time  1130    PT Time Calculation (min)  55 min    Activity Tolerance  Patient tolerated treatment well    Behavior During Therapy  [facility] for tasks assessed/performed       Past Medical History:  Diagnosis Date  . Breast cancer ([place]) 07/2018   right  . Dental crown present   . Family history of bladder cancer   . Family history of breast cancer   . Family history of colon cancer   . Family history of pancreatic cancer   . 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  . Migraines   . PONV (postoperative nausea and vomiting)     Past Surgical History:  Procedure Laterality Date  . BILATERAL TOTAL MASTECTOMY WITH AXILLARY LYMPH NODE DISSECTION N/A 11/06/2018   Procedure: BILATERAL TOTAL MASTECTOMY WITH AXILLARY LYMPH NODE DISSECTION;  Surgeon: Fanny Skates, MD;  Location: [place];  Service: General;  Laterality: N/A;  . BREAST BIOPSY Right 11/08/2013   Procedure: EXCISION OF RIGHT  BREAST MASS;  Surgeon: Adin Hector, MD;  Location: [place];  Service: General;  Laterality: Right;  . BUNIONECTOMY Left   . CARPAL TUNNEL RELEASE Right 02/26/2017  . CERVICAL CONE BIOPSY  05/2005  . COLONOSCOPY  2014  . DRESSING CHANGE UNDER ANESTHESIA Right 11/29/2018   Procedure: DRESSING CHANGE UNDER ANESTHESIA;  Surgeon: Fanny Skates, MD;  Location: [place];  Service: General;  Laterality: Right;  . IRRIGATION AND DEBRIDEMENT ABSCESS Right 11/28/2018   Procedure:  Exploration and Drainage of Right Mastectomy wound.;  Surgeon: Fanny Skates, MD;  Location: [place];  Service: General;  Laterality: Right;  . PORT-A-CATH REMOVAL N/A 11/06/2018   Procedure: Removal Port-A-Cath;  Surgeon: Fanny Skates, MD;  Location: [place];  Service: General;  Laterality: N/A;  . PORTACATH PLACEMENT Right 07/08/2018   Procedure: INSERTION PORT-A-CATH;  Surgeon: Fanny Skates, MD;  Location: [place];  Service: General;  Laterality: Right;  . SHOULDER ARTHROSCOPY W/ LABRAL REPAIR Right   . SHOULDER ARTHROSCOPY W/ ROTATOR CUFF REPAIR Left   . TENOTOMY FOREARM / WRIST Right 02/26/2017    There were no vitals filed for this visit.  Subjective Assessment - 03/18/19 1038    Subjective  Pt reports that she feels about the same. She states that she fell 2 days ago when going up the stairs so she is very sore all over. Pt reports that she felt okay after her last session w/o any significant increase in pain. She has been keeping her eye on the small moist cracked area in her scar and reports some days it is better than others.    Pertinent History  Neoadjuvant chemo for right breast cancer 07/09/2018-10/01/2018. Bilateral mastectomy 11/06/2018 with right sentinel node biopsy.    Patient Stated Goals  Decrease arm pain and restore shoulder ROM    Currently in Pain?  Yes    Pain Score  5     Pain Location  Arm    Pain Orientation  Right    Pain Descriptors / Indicators  Burning    Pain Type  Neuropathic pain    Pain Radiating Towards  right elbow    Pain Onset  More than a month ago    Aggravating Factors   touching he back of her arm    Pain Relieving Factors  Gabapentin                       [facility] Adult PT Treatment/Exercise - 03/18/19 0001      Shoulder Exercises: Seated   Other Seated Exercises  Radial nerve flossing in sitting to address pain in the posterior R brachium. Minimal results. Pt suggested to try this at home to see if it helps. Avoid  increased symptoms.       Manual Therapy   Manual therapy comments  IASTM was performed over scar with care to avoid cracked area in order to decrease adhesions in this area and stimulate lymphatic flow. Followed by full anterior trunk MLD and R arm MLD to decrease risk of lymphedema.     Manual Lymphatic Drainage (MLD)  in supine MLD performed with focus on the trunk toward inguinal lymph nodes with emphasis on R axillo-inguinal anastomosis and R arm. Work on Posterior superior and inferior anastamosis w/increased focus on the inferior anastamosis.     Passive ROM  R shoulder PROM in flexion/abd/ER/IR with firm end-feel w/decrease ROM slight stretch at end range to pt tolerance. pt reported slight increase in pain that was able to decrease w/voice cues to relax and oscillations             PT Education - 03/18/19 1240    Education Details  See pt instructions    Person(s) Educated  Patient    Methods  Explanation;Demonstration    Comprehension  Verbalized understanding;Returned demonstration       PT Short Term Goals - 01/14/19 1244      PT SHORT TERM GOAL #1   Title  Patient will be independent with initial home exercise program.    Baseline  Pt independent with streches thus far and has been incorporating other stetches into ADLs-01/08/19    Status  Achieved      PT SHORT TERM GOAL #2   Title  Patient will increase right shoulder active flexion to >/= 120 degrees for increased ease reaching overhead.    Baseline  149 degrees-01/08/19    Status  Achieved      PT SHORT TERM GOAL #3   Title  Patient will increase right shoulder active abduction to >/= 120 degrees for increased ease reaching overhead.    Status  Achieved      PT SHORT TERM GOAL #4   Title  Patient will verbalize good understanding of lymphedema risk reduction practices.    Status  Achieved        PT Long Term Goals - 03/11/19 1045      PT LONG TERM GOAL #1   Title  Patient will be able to demonstrate  independence with her home exercise program for shoulder ROM.    Status  On-going      PT LONG TERM GOAL #2   Title  Patient will increase right shoulder active flexion to >/= 140 degrees for increased ease reaching overhead.    Status  On-going      PT LONG TERM GOAL #3  Title  Patient will increase right shoulder active abduction to >/= 140 degrees for increased ease reaching overhead.    Status  On-going      PT LONG TERM GOAL #4   Title  Patient will verbalize pain has reduced by >/= 40% since eval to tolerate daily tasks with greater ease.    Status  On-going      PT LONG TERM GOAL #5   Title  Improve DASH score to </= 20 for imporved overall shoulder function.    Status  On-going            Plan - 03/18/19 1242    Clinical Impression Statement  Pt continues to exhibit open small/red slightly open/cracked area in the scar on her R anterior chest wall. No exacerbation or improvement noted from last session. Pt tolerated IASTM to anterior scarring well; crepitus noted that decreased following very light IASTM. Full anterior trunk MLD sequencing and RUE MLD sequence performed following IASTM in order to decrease risk for lymphedema. Radial nerve flossing trial to see if this helps breakup any possible adhesions to nerve tissue and decrease radiating pain in posterior brachium w/instructions for desensitization procedure and technique.    Personal Factors and Comorbidities  Comorbidity 1    Comorbidities  pt has a lot of scar tissue in this area due to recent infection.    Stability/Clinical Decision Making  Stable/Uncomplicated    Clinical Decision Making  Low    Rehab Potential  Good    PT Frequency  2x / week    PT Duration  8 weeks    PT Treatment/Interventions  ADLs/Self Care Home Management;Therapeutic activities;Therapeutic exercise;Manual lymph drainage;Manual techniques;Patient/family education;Scar mobilization;Passive range of motion    PT Next Visit Plan  Assess  radial nerve flossing and desensitization. Go through [facility] exercies w/patient in clinic to see her performance of these activities. Assess IASTM.    PT Home Exercise Plan  [place]idal nerve flossing and desensitization.    Consulted and Agree with Plan of Care  Patient       Patient will benefit from skilled therapeutic intervention in order to improve the following deficits and impairments:  Decreased skin integrity, Pain, Impaired UE functional use, Increased fascial restricitons, Decreased strength, Decreased knowledge of precautions, Decreased scar mobility, Decreased range of motion, Postural dysfunction  Visit Diagnosis: Aftercare following surgery for neoplasm  Pain in right upper arm  Stiffness of right shoulder, not elsewhere classified  Abnormal posture     Problem List Patient Active Problem List   Diagnosis Date Noted  . Wound infection after surgery 11/28/2018  . Breast cancer ([place]) 11/26/2018  . Postoperative wound infection 11/26/2018  . Breast cancer of upper-outer quadrant of right female breast ([place]) 11/06/2018  . Genetic testing 08/20/2018  . 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 Dehydration 07/24/2018  . Nausea and vomiting 07/24/2018  . Nausea without vomiting 07/24/2018  . Family history of breast cancer   . Family history of colon cancer   . Family history of bladder cancer   . Family history of pancreatic cancer   . Port-A-Cath in place 07/16/2018  . Malignant neoplasm of upper-outer quadrant of right breast in female, estrogen receptor negative ([place]) 06/29/2018  . 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 Macromastia 11/19/2013  . Breast mass, right 10/20/2013  . Abnormal Pap smear 01/01/2012    Ander Purpura , PT 03/18/2019, 12:51 PM  [place]  [place], [place], [address] Phone: [phone]   Fax:  [phone]  Name: Holly Wiley MRN: [id number] Date of Birth: 10/08/1975

## 2019-03-22 ENCOUNTER — Other Ambulatory Visit: Payer: Self-pay

## 2019-03-22 ENCOUNTER — Ambulatory Visit: Payer: BC Managed Care – PPO

## 2019-03-22 DIAGNOSIS — G540 Brachial plexus disorders: Secondary | ICD-10-CM

## 2019-03-22 DIAGNOSIS — M79621 Pain in right upper arm: Secondary | ICD-10-CM

## 2019-03-22 DIAGNOSIS — M25611 Stiffness of right shoulder, not elsewhere classified: Secondary | ICD-10-CM

## 2019-03-22 DIAGNOSIS — Z483 Aftercare following surgery for neoplasm: Secondary | ICD-10-CM | POA: Diagnosis not present

## 2019-03-22 DIAGNOSIS — R293 Abnormal posture: Secondary | ICD-10-CM

## 2019-03-22 HISTORY — DX: Brachial plexus disorders: G54.0

## 2019-03-22 NOTE — Therapy (Signed)
[place], [place], [address] Phone: [phone]   Fax:  [phone]  Physical Therapy Treatment  Patient Details  Name: Holly Wiley MRN: [id number] Date of Birth: [birth year] Referring Provider (PT): Dr. Fanny Skates   Encounter Date: 03/22/2019  PT End of Session - 03/22/19 1159    Visit Number  14    Number of Visits  17    Date for PT Re-Evaluation  04/02/19    PT Start Time  0905    PT Stop Time  1000    PT Time Calculation (min)  55 min    Activity Tolerance  Patient tolerated treatment well    Behavior During Therapy  [facility] for tasks assessed/performed       Past Medical History:  Diagnosis Date  . Breast cancer ([place]) 07/2018   right  . Dental crown present   . Family history of bladder cancer   . Family history of breast cancer   . Family history of colon cancer   . Family history of pancreatic cancer   . 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  . Migraines   . PONV (postoperative nausea and vomiting)     Past Surgical History:  Procedure Laterality Date  . BILATERAL TOTAL MASTECTOMY WITH AXILLARY LYMPH NODE DISSECTION N/A 11/06/2018   Procedure: BILATERAL TOTAL MASTECTOMY WITH AXILLARY LYMPH NODE DISSECTION;  Surgeon: Fanny Skates, MD;  Location: [place];  Service: General;  Laterality: N/A;  . BREAST BIOPSY Right 11/08/2013   Procedure: EXCISION OF RIGHT  BREAST MASS;  Surgeon: Adin Hector, MD;  Location: [place];  Service: General;  Laterality: Right;  . BUNIONECTOMY Left   . CARPAL TUNNEL RELEASE Right 02/26/2017  . CERVICAL CONE BIOPSY  05/2005  . COLONOSCOPY  2014  . DRESSING CHANGE UNDER ANESTHESIA Right 11/29/2018   Procedure: DRESSING CHANGE UNDER ANESTHESIA;  Surgeon: Fanny Skates, MD;  Location: [place];  Service: General;  Laterality: Right;  . IRRIGATION AND DEBRIDEMENT ABSCESS Right 11/28/2018   Procedure:  Exploration and Drainage of Right Mastectomy wound.;  Surgeon: Fanny Skates, MD;  Location: [place];  Service: General;  Laterality: Right;  . PORT-A-CATH REMOVAL N/A 11/06/2018   Procedure: Removal Port-A-Cath;  Surgeon: Fanny Skates, MD;  Location: [place];  Service: General;  Laterality: N/A;  . PORTACATH PLACEMENT Right 07/08/2018   Procedure: INSERTION PORT-A-CATH;  Surgeon: Fanny Skates, MD;  Location: [place];  Service: General;  Laterality: Right;  . SHOULDER ARTHROSCOPY W/ LABRAL REPAIR Right   . SHOULDER ARTHROSCOPY W/ ROTATOR CUFF REPAIR Left   . TENOTOMY FOREARM / WRIST Right 02/26/2017    There were no vitals filed for this visit.  Subjective Assessment - 03/22/19 0907    Subjective  Pt states that she went to the pain clinic on Friday and had a very good visit. She spoke with the MD about another type of injection. She is increasing her Cymbalta and if this doesn't work he discussed an infusion. Pt states she also went to [facility] and was fit for a bra that she states gives her much better compression than her previous bra. She states that she still has not noticed much change in her pain levels or swelling.    Pertinent History  Neoadjuvant chemo for right breast cancer 07/09/2018-10/01/2018. Bilateral mastectomy 11/06/2018 with right sentinel node biopsy.    Patient Stated Goals  Decrease arm pain and restore shoulder  ROM    Currently in Pain?  Yes    Pain Score  6     Pain Location  Arm    Pain Orientation  Right    Pain Descriptors / Indicators  Burning    Pain Type  Neuropathic pain    Pain Radiating Towards  Right elbow    Pain Onset  More than a month ago    Aggravating Factors   Touch    Pain Relieving Factors  Nothing of note.                       [facility] Adult PT Treatment/Exercise - 03/22/19 0001      Shoulder Exercises: Standing   Retraction  10 reps;AROM    Retraction Limitations  Discussed avoiding shoulder elevation to prevent  over working the upper trap during scapular retraction.       Shoulder Exercises: Stretch   External Rotation Stretch  2 reps;30 seconds   in standing at door frame until she feels light stretch   Other Shoulder Stretches  Shoulder flexion stretch at counter; stepping back to feel light stretching at end range for improve ROM.              PT Education - 03/22/19 1151    Education Details  Pt educated to perform shoulder counter stretch, single arm pec stretch and scapular retraction for all ROM to continue to improve her mobility. Provided w/1/2 inch gray foam to wear on her R lateral trunk below her axilla for edema in this area. Discussed continuing with scar massage and tactile input for desensitization.    Person(s) Educated  Patient    Methods  Explanation    Comprehension  Verbalized understanding       PT Short Term Goals - 01/14/19 1244      PT SHORT TERM GOAL #1   Title  Patient will be independent with initial home exercise program.    Baseline  Pt independent with streches thus far and has been incorporating other stetches into ADLs-01/08/19    Status  Achieved      PT SHORT TERM GOAL #2   Title  Patient will increase right shoulder active flexion to >/= 120 degrees for increased ease reaching overhead.    Baseline  149 degrees-01/08/19    Status  Achieved      PT SHORT TERM GOAL #3   Title  Patient will increase right shoulder active abduction to >/= 120 degrees for increased ease reaching overhead.    Status  Achieved      PT SHORT TERM GOAL #4   Title  Patient will verbalize good understanding of lymphedema risk reduction practices.    Status  Achieved        PT Long Term Goals - 03/11/19 1045      PT LONG TERM GOAL #1   Title  Patient will be able to demonstrate independence with her home exercise program for shoulder ROM.    Status  On-going      PT LONG TERM GOAL #2   Title  Patient will increase right shoulder active flexion to >/= 140 degrees for  increased ease reaching overhead.    Status  On-going      PT LONG TERM GOAL #3   Title  Patient will increase right shoulder active abduction to >/= 140 degrees for increased ease reaching overhead.    Status  On-going      PT LONG TERM  GOAL #4   Title  Patient will verbalize pain has reduced by >/= 40% since eval to tolerate daily tasks with greater ease.    Status  On-going      PT LONG TERM GOAL #5   Title  Improve DASH score to </= 20 for imporved overall shoulder function.    Status  On-going            Plan - 03/22/19 1202    Clinical Impression Statement  Pt continuesto exhibit open small/red slightly open/cracked area in the scar on her R anterior chest wall. No exacerbation or improvement noted from last session. Pt tolerated cross friction massage over scar tissue on the R anterior chest/lateral trunk wall. Full anterior trunk MLD sequencing with extra focus on the R lateral trunk in order to decrease risk for lymphedema. Discussed AAROM stretching activities to decrease stiffness in the chest wall and R shoulder. Pt was provided w/1/2 inch gray foam for her compression bra in order to decrease edema from anterior to posterior. Continue with current POC.    Personal Factors and Comorbidities  Comorbidity 1    Comorbidities  pt has a lot of scar tissue in this area due to recent infection.    Stability/Clinical Decision Making  Stable/Uncomplicated    Clinical Decision Making  Low    Rehab Potential  Good    PT Frequency  2x / week    PT Duration  8 weeks    PT Treatment/Interventions  ADLs/Self Care Home Management;Therapeutic activities;Therapeutic exercise;Manual lymph drainage;Manual techniques;Patient/family education;Scar mobilization;Passive range of motion    PT Next Visit Plan  Assess foam and flexitouch.    PT Home Exercise Plan  AAROM stretching into flexion, pec stretch, nerve flossing and scapular retraction.    Consulted and Agree with Plan of Care  Patient        Patient will benefit from skilled therapeutic intervention in order to improve the following deficits and impairments:  Decreased skin integrity, Pain, Impaired UE functional use, Increased fascial restricitons, Decreased strength, Decreased knowledge of precautions, Decreased scar mobility, Decreased range of motion, Postural dysfunction  Visit Diagnosis: Aftercare following surgery for neoplasm  Pain in right upper arm  Stiffness of right shoulder, not elsewhere classified  Abnormal posture     Problem List Patient Active Problem List   Diagnosis Date Noted  . Wound infection after surgery 11/28/2018  . Breast cancer ([place]) 11/26/2018  . Postoperative wound infection 11/26/2018  . Breast cancer of upper-outer quadrant of right female breast ([place]) 11/06/2018  . Genetic testing 08/20/2018  . 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 Dehydration 07/24/2018  . Nausea and vomiting 07/24/2018  . Nausea without vomiting 07/24/2018  . Family history of breast cancer   . Family history of colon cancer   . Family history of bladder cancer   . Family history of pancreatic cancer   . Port-A-Cath in place 07/16/2018  . Malignant neoplasm of upper-outer quadrant of right breast in female, estrogen receptor negative ([place]) 06/29/2018  . 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 Macromastia 11/19/2013  . Breast mass, right 10/20/2013  . Abnormal Pap smear 01/01/2012    Ander Purpura, PT 03/22/2019, 12:19 PM  [place], [place], [address] Phone: [phone]   Fax:  [phone]  Name: Holly Wiley MRN: [id number] Date of Birth: 08/05/1975

## 2019-03-31 ENCOUNTER — Other Ambulatory Visit: Payer: Self-pay

## 2019-03-31 ENCOUNTER — Ambulatory Visit: Payer: BC Managed Care – PPO

## 2019-03-31 ENCOUNTER — Encounter: Payer: Self-pay | Admitting: General Practice

## 2019-03-31 DIAGNOSIS — Z483 Aftercare following surgery for neoplasm: Secondary | ICD-10-CM | POA: Diagnosis not present

## 2019-03-31 DIAGNOSIS — M25611 Stiffness of right shoulder, not elsewhere classified: Secondary | ICD-10-CM

## 2019-03-31 DIAGNOSIS — R293 Abnormal posture: Secondary | ICD-10-CM

## 2019-03-31 DIAGNOSIS — M79621 Pain in right upper arm: Secondary | ICD-10-CM

## 2019-03-31 NOTE — Progress Notes (Signed)
[facility] CSW Progress Notes  Request received from patient's physical therapist, Tomma Rakers, due to severe pain and distressing sequelae from breast cancer diagnosis/surgery.  Called patient, described struggle w near constant pain since recent surgery. Has tried multiple approaches including mindfulness, meditation, exercise, proper diet, multiple medications and strategies.  Is under care of pain management specialist and physical therapist as well as oncology.  Will work with patient to find good fit w therapist, preferably in her network.  Have emailed her information on out of network chronic pain specialist at [facility].  Will call patient tomorrow w updated resources.  Holly Shell, LCSW Clinical Social Worker Phone:  [phone] Cell:  [phone]

## 2019-03-31 NOTE — Therapy (Signed)
[place], [place], [address] Phone: [phone]   Fax:  [phone]  Physical Therapy Treatment  Patient Details  Name: Holly Wiley MRN: [id number] Date of Birth: [birth year] Referring Provider (PT): Dr. Fanny Skates   Encounter Date: 03/31/2019  PT End of Session - 03/31/19 1122    Visit Number  15    Number of Visits  17    Date for PT Re-Evaluation  04/02/19    PT Start Time  1000    PT Stop Time  1045    PT Time Calculation (min)  45 min    Activity Tolerance  Patient tolerated treatment well    Behavior During Therapy  [facility] for tasks assessed/performed       Past Medical History:  Diagnosis Date  . Breast cancer ([place]) 07/2018   right  . Dental crown present   . Family history of bladder cancer   . Family history of breast cancer   . Family history of colon cancer   . Family history of pancreatic cancer   . 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  . Migraines   . PONV (postoperative nausea and vomiting)     Past Surgical History:  Procedure Laterality Date  . BILATERAL TOTAL MASTECTOMY WITH AXILLARY LYMPH NODE DISSECTION N/A 11/06/2018   Procedure: BILATERAL TOTAL MASTECTOMY WITH AXILLARY LYMPH NODE DISSECTION;  Surgeon: Fanny Skates, MD;  Location: [place];  Service: General;  Laterality: N/A;  . BREAST BIOPSY Right 11/08/2013   Procedure: EXCISION OF RIGHT  BREAST MASS;  Surgeon: Adin Hector, MD;  Location: [place];  Service: General;  Laterality: Right;  . BUNIONECTOMY Left   . CARPAL TUNNEL RELEASE Right 02/26/2017  . CERVICAL CONE BIOPSY  05/2005  . COLONOSCOPY  2014  . DRESSING CHANGE UNDER ANESTHESIA Right 11/29/2018   Procedure: DRESSING CHANGE UNDER ANESTHESIA;  Surgeon: Fanny Skates, MD;  Location: [place];  Service: General;  Laterality: Right;  . IRRIGATION AND DEBRIDEMENT ABSCESS Right 11/28/2018   Procedure:  Exploration and Drainage of Right Mastectomy wound.;  Surgeon: Fanny Skates, MD;  Location: [place];  Service: General;  Laterality: Right;  . PORT-A-CATH REMOVAL N/A 11/06/2018   Procedure: Removal Port-A-Cath;  Surgeon: Fanny Skates, MD;  Location: [place];  Service: General;  Laterality: N/A;  . PORTACATH PLACEMENT Right 07/08/2018   Procedure: INSERTION PORT-A-CATH;  Surgeon: Fanny Skates, MD;  Location: [place];  Service: General;  Laterality: Right;  . SHOULDER ARTHROSCOPY W/ LABRAL REPAIR Right   . SHOULDER ARTHROSCOPY W/ ROTATOR CUFF REPAIR Left   . TENOTOMY FOREARM / WRIST Right 02/26/2017    There were no vitals filed for this visit.  Subjective Assessment - 03/31/19 1116    Subjective  Pt states that she had a scalene block a couple of days ago that does not seem to be working. She reports that she is in so much pain but is forcing herself to perform MLD at home and is using the Flexitouch. She has pulleys at home and has been slowly working on her shoulder ROM which has significnatly improved but she has a hard time keeping up with anything because she is not sleeping and constantly has pain. She has tried e-stim which is just very painful for her so she quit using.    Pertinent History  Neoadjuvant chemo for right breast cancer 07/09/2018-10/01/2018. Bilateral mastectomy 11/06/2018 with right sentinel node biopsy.  Patient Stated Goals  Decrease arm pain and restore shoulder ROM    Pain Score  6     Pain Location  Arm    Pain Orientation  Right    Pain Descriptors / Indicators  Burning    Pain Type  Neuropathic pain    Pain Radiating Towards  Right elbow    Pain Onset  More than a month ago    Pain Frequency  Constant    Aggravating Factors   touch    Pain Relieving Factors  Nothing                       [facility] Adult PT Treatment/Exercise - 03/31/19 0001      Therapeutic Activites    Therapeutic Activities  Other Therapeutic Activities     Other Therapeutic Activities  Discussed in depth what patient is doing at home for lymphedema management and what she has or has not gotten relief from. Discussed focusing on decreasing pain at this time as a priority since pt is experiencing a very difficult time with any activities due to level of pain.              PT Education - 03/31/19 1121    Education Details  Pt will continue with flexitouch pump, self MLD and arm exercises as tolerated at this time. She will bring her garment in to make sure it fits correctly once she receives it. She will be referred to a psychotherapist for pain management at this time to facilitate healing and tolerance of activities. Pt will be discharged from physical therapy at this time.    Person(s) Educated  Patient    Methods  Explanation    Comprehension  Verbalized understanding       PT Short Term Goals - 01/14/19 1244      PT SHORT TERM GOAL #1   Title  Patient will be independent with initial home exercise program.    Baseline  Pt independent with streches thus far and has been incorporating other stetches into ADLs-01/08/19    Status  Achieved      PT SHORT TERM GOAL #2   Title  Patient will increase right shoulder active flexion to >/= 120 degrees for increased ease reaching overhead.    Baseline  149 degrees-01/08/19    Status  Achieved      PT SHORT TERM GOAL #3   Title  Patient will increase right shoulder active abduction to >/= 120 degrees for increased ease reaching overhead.    Status  Achieved      PT SHORT TERM GOAL #4   Title  Patient will verbalize good understanding of lymphedema risk reduction practices.    Status  Achieved        PT Long Term Goals - 03/31/19 1123      PT LONG TERM GOAL #1   Title  Patient will be able to demonstrate independence with her home exercise program for shoulder ROM.    Status  Not Met      PT LONG TERM GOAL #2   Title  Patient will increase right shoulder active flexion to >/= 140  degrees for increased ease reaching overhead.    Baseline  135 on 7/16, 160 on 02/25/2019    Status  Achieved      PT LONG TERM GOAL #3   Title  Patient will increase right shoulder active abduction to >/= 140 degrees for increased ease reaching overhead.  Baseline  145 on 7/16, 145 on 02/25/2019    Status  Achieved      PT LONG TERM GOAL #4   Title  Patient will verbalize pain has reduced by >/= 40% since eval to tolerate daily tasks with greater ease.    Baseline  pt is still having nerve pain    Status  Not Met      PT LONG TERM GOAL #5   Title  Improve DASH score to </= 20 for imporved overall shoulder function.    Status  Not Met            Plan - 03/31/19 1125    Clinical Impression Statement  Pt presents to physical therapy this session with continued burning pain in her R posterior brachium. She has tried multiple activities for pain reduction but has not had any relief. She is currently seeing a pain specialist in [facility] but has not had any relief at this time but does have a plan with this MD. Pt has met all of her ROM goals but continues with difficulty using the arm due to pain from any touch. She is managing her lymphedema at home at this time and has adequate materials to manage at home. Pt pain has not improved significantly over the past  month with physical therapy. Discussed this with patient in depth including a referral to a psychotherapist for pain management in conjunction with her current pain MD. Pt was agreeable and will consult MD on possible referral. Pt will return to make sure that her garment fits correctly. She will be discharged at this time to home management and will return to MD for physical therapy referral if she notices any increased edema or difficulty with shoulder ROM.    Personal Factors and Comorbidities  Comorbidity 1    Comorbidities  pt has a lot of scar tissue in this area due to recent infection.    Stability/Clinical Decision Making   Stable/Uncomplicated    Clinical Decision Making  Low    PT Treatment/Interventions  ADLs/Self Care Home Management;Therapeutic activities;Therapeutic exercise;Manual lymph drainage;Manual techniques;Patient/family education;Scar mobilization;Passive range of motion    PT Next Visit Plan  Pt is dicharged    Consulted and Agree with Plan of Care  Patient       Patient will benefit from skilled therapeutic intervention in order to improve the following deficits and impairments:  Decreased skin integrity, Pain, Impaired UE functional use, Increased fascial restricitons, Decreased strength, Decreased knowledge of precautions, Decreased scar mobility, Decreased range of motion, Postural dysfunction  Visit Diagnosis: Aftercare following surgery for neoplasm  Pain in right upper arm  Stiffness of right shoulder, not elsewhere classified  Abnormal posture     Problem List Patient Active Problem List   Diagnosis Date Noted  . Wound infection after surgery 11/28/2018  . Breast cancer ([place]) 11/26/2018  . Postoperative wound infection 11/26/2018  . Breast cancer of upper-outer quadrant of right female breast ([place]) 11/06/2018  . Genetic testing 08/20/2018  . 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 Dehydration 07/24/2018  . Nausea and vomiting 07/24/2018  . Nausea without vomiting 07/24/2018  . Family history of breast cancer   . Family history of colon cancer   . Family history of bladder cancer   . Family history of pancreatic cancer   . Port-A-Cath in place 07/16/2018  . Malignant neoplasm of upper-outer quadrant of right breast in female, estrogen receptor negative ([place]) 06/29/2018  . Aftercare following right  elbow joint replacement surgery 02/27/2017  . Macromastia 11/19/2013  . Breast mass, right 10/20/2013  . Abnormal Pap smear 01/01/2012    Ander Purpura, PT 03/31/2019, 11:30 AM  [place], [place], [address] Phone: [phone]   Fax:  [phone]  Name: Holly Wiley MRN: [id number] Date of Birth: 11-20-1975

## 2019-04-01 ENCOUNTER — Telehealth: Payer: Self-pay | Admitting: General Practice

## 2019-04-01 NOTE — Telephone Encounter (Signed)
[facility] CSW Progress Notes  Call to patient to review options for supportive therapy for pain management, sent yesterday by email.  Left VM requesting return call.  [name], LCSW Clinical Social Worker Phone:  [phone]

## 2019-04-27 ENCOUNTER — Telehealth: Payer: Self-pay

## 2019-04-27 NOTE — Telephone Encounter (Signed)
Spoke with patient on the phone she has her sleeve and states that it fits well.   Tomma Rakers DPT

## 2019-05-12 ENCOUNTER — Telehealth: Payer: Self-pay

## 2019-05-12 ENCOUNTER — Other Ambulatory Visit: Payer: Self-pay

## 2019-05-12 DIAGNOSIS — Z171 Estrogen receptor negative status [ER-]: Secondary | ICD-10-CM

## 2019-05-12 DIAGNOSIS — C50411 Malignant neoplasm of upper-outer quadrant of right female breast: Secondary | ICD-10-CM

## 2019-05-12 DIAGNOSIS — R222 Localized swelling, mass and lump, trunk: Secondary | ICD-10-CM

## 2019-05-12 NOTE — Telephone Encounter (Signed)
Pt reports new mass to left chest wall.  Pt with h/o bilateral mastectomies.  No changes in activities.  Pt also reports pain to right side of ribs, again no injuries to area per pt.  RN reviewed with MD, verbal orders for ultrasound with biopsy and bone scan.

## 2019-05-13 ENCOUNTER — Ambulatory Visit
Admission: RE | Admit: 2019-05-13 | Discharge: 2019-05-13 | Disposition: A | Discharge status: Home / Self Care | Payer: BC Managed Care – PPO | Source: Ambulatory Visit | Attending: Hematology and Oncology | Admitting: Hematology and Oncology

## 2019-05-13 ENCOUNTER — Other Ambulatory Visit: Payer: Self-pay

## 2019-05-13 DIAGNOSIS — Z171 Estrogen receptor negative status [ER-]: Secondary | ICD-10-CM

## 2019-05-13 DIAGNOSIS — R222 Localized swelling, mass and lump, trunk: Secondary | ICD-10-CM

## 2019-05-13 DIAGNOSIS — C50411 Malignant neoplasm of upper-outer quadrant of right female breast: Secondary | ICD-10-CM

## 2019-05-17 ENCOUNTER — Ambulatory Visit
Admission: RE | Admit: 2019-05-17 | Discharge: 2019-05-17 | Disposition: A | Discharge status: Home / Self Care | Payer: BC Managed Care – PPO | Source: Ambulatory Visit | Attending: Hematology and Oncology | Admitting: Hematology and Oncology

## 2019-05-17 ENCOUNTER — Other Ambulatory Visit: Payer: Self-pay | Admitting: General Surgery

## 2019-05-17 ENCOUNTER — Ambulatory Visit
Admission: RE | Admit: 2019-05-17 | Discharge: 2019-05-17 | Disposition: A | Discharge status: Home / Self Care | Payer: BC Managed Care – PPO | Source: Ambulatory Visit | Attending: General Surgery | Admitting: General Surgery

## 2019-05-17 ENCOUNTER — Other Ambulatory Visit: Payer: Self-pay

## 2019-05-17 DIAGNOSIS — R9389 Abnormal findings on diagnostic imaging of other specified body structures: Secondary | ICD-10-CM

## 2019-05-17 DIAGNOSIS — R222 Localized swelling, mass and lump, trunk: Secondary | ICD-10-CM

## 2019-05-17 DIAGNOSIS — C50411 Malignant neoplasm of upper-outer quadrant of right female breast: Secondary | ICD-10-CM

## 2019-05-28 ENCOUNTER — Other Ambulatory Visit: Payer: Self-pay

## 2019-05-28 ENCOUNTER — Ambulatory Visit (HOSPITAL_COMMUNITY)
Admission: RE | Admit: 2019-05-28 | Discharge: 2019-05-28 | Disposition: A | Discharge status: Home / Self Care | Payer: BC Managed Care – PPO | Source: Ambulatory Visit | Attending: Hematology and Oncology | Admitting: Hematology and Oncology

## 2019-05-28 ENCOUNTER — Encounter (HOSPITAL_COMMUNITY)
Admission: RE | Admit: 2019-05-28 | Discharge: 2019-05-28 | Disposition: A | Discharge status: Home / Self Care | Payer: BC Managed Care – PPO | Source: Ambulatory Visit | Attending: Hematology and Oncology | Admitting: Hematology and Oncology

## 2019-05-28 DIAGNOSIS — C50411 Malignant neoplasm of upper-outer quadrant of right female breast: Secondary | ICD-10-CM | POA: Insufficient documentation

## 2019-05-28 DIAGNOSIS — Z171 Estrogen receptor negative status [ER-]: Secondary | ICD-10-CM | POA: Insufficient documentation

## 2019-05-28 MED ORDER — TECHNETIUM TC 99M MEDRONATE IV KIT
22.0000 | PACK | Freq: Once | INTRAVENOUS | Status: AC
  Administered 2019-05-28: 22 via INTRAVENOUS

## 2019-05-31 ENCOUNTER — Telehealth: Payer: Self-pay | Admitting: *Deleted

## 2019-05-31 NOTE — Telephone Encounter (Signed)
Per Wilber Bihari, NP request, RN attempt x1 to call pt to inform her that her bone scan was normal.  No answer, LVM to return call to the office.

## 2019-06-08 ENCOUNTER — Encounter: Payer: Self-pay | Admitting: *Deleted

## 2019-06-10 ENCOUNTER — Other Ambulatory Visit: Payer: Self-pay | Admitting: Hematology and Oncology

## 2019-07-01 ENCOUNTER — Other Ambulatory Visit: Payer: Self-pay

## 2019-07-01 ENCOUNTER — Encounter (HOSPITAL_BASED_OUTPATIENT_CLINIC_OR_DEPARTMENT_OTHER): Payer: Self-pay | Admitting: Plastic Surgery

## 2019-07-01 DIAGNOSIS — Z9013 Acquired absence of bilateral breasts and nipples: Secondary | ICD-10-CM | POA: Insufficient documentation

## 2019-07-01 HISTORY — DX: Acquired absence of bilateral breasts and nipples: Z90.13

## 2019-07-09 ENCOUNTER — Ambulatory Visit (HOSPITAL_BASED_OUTPATIENT_CLINIC_OR_DEPARTMENT_OTHER): Admit: 2019-07-09 | Payer: BC Managed Care – PPO | Admitting: Plastic Surgery

## 2019-07-09 SURGERY — LESION EXCISION WITH COMPLEX REPAIR
Anesthesia: General | Site: Chest

## 2019-07-27 ENCOUNTER — Other Ambulatory Visit: Payer: Self-pay

## 2019-07-27 ENCOUNTER — Encounter (HOSPITAL_BASED_OUTPATIENT_CLINIC_OR_DEPARTMENT_OTHER): Payer: Self-pay | Admitting: Plastic Surgery

## 2019-07-30 ENCOUNTER — Other Ambulatory Visit (HOSPITAL_COMMUNITY)
Admission: RE | Admit: 2019-07-30 | Discharge: 2019-07-30 | Disposition: A | Discharge status: Home / Self Care | Payer: BC Managed Care – PPO | Source: Ambulatory Visit | Attending: Plastic Surgery | Admitting: Plastic Surgery

## 2019-07-30 DIAGNOSIS — Z01812 Encounter for preprocedural laboratory examination: Secondary | ICD-10-CM | POA: Diagnosis not present

## 2019-07-30 DIAGNOSIS — Z20822 Contact with and (suspected) exposure to covid-19: Secondary | ICD-10-CM | POA: Diagnosis not present

## 2019-07-30 LAB — SARS CORONAVIRUS 2 (TAT 6-24 HRS): SARS Coronavirus 2: NEGATIVE

## 2019-08-02 NOTE — Progress Notes (Signed)

## 2019-08-03 ENCOUNTER — Ambulatory Visit (HOSPITAL_BASED_OUTPATIENT_CLINIC_OR_DEPARTMENT_OTHER)
Admission: RE | Admit: 2019-08-03 | Discharge: 2019-08-03 | Disposition: A | Discharge status: Home / Self Care | Payer: BC Managed Care – PPO | Attending: Plastic Surgery | Admitting: Plastic Surgery

## 2019-08-03 ENCOUNTER — Other Ambulatory Visit: Payer: Self-pay

## 2019-08-03 ENCOUNTER — Ambulatory Visit (HOSPITAL_BASED_OUTPATIENT_CLINIC_OR_DEPARTMENT_OTHER): Payer: BC Managed Care – PPO | Admitting: Certified Registered"

## 2019-08-03 ENCOUNTER — Encounter (HOSPITAL_BASED_OUTPATIENT_CLINIC_OR_DEPARTMENT_OTHER)
Admission: RE | Disposition: A | Discharge status: Home / Self Care | Payer: Self-pay | Source: Home / Self Care | Attending: Plastic Surgery

## 2019-08-03 ENCOUNTER — Encounter (HOSPITAL_BASED_OUTPATIENT_CLINIC_OR_DEPARTMENT_OTHER): Payer: Self-pay | Admitting: Plastic Surgery

## 2019-08-03 DIAGNOSIS — L987 Excessive and redundant skin and subcutaneous tissue: Secondary | ICD-10-CM | POA: Diagnosis present

## 2019-08-03 DIAGNOSIS — Z9013 Acquired absence of bilateral breasts and nipples: Secondary | ICD-10-CM | POA: Diagnosis not present

## 2019-08-03 DIAGNOSIS — Z853 Personal history of malignant neoplasm of breast: Secondary | ICD-10-CM | POA: Insufficient documentation

## 2019-08-03 DIAGNOSIS — Z9221 Personal history of antineoplastic chemotherapy: Secondary | ICD-10-CM | POA: Insufficient documentation

## 2019-08-03 DIAGNOSIS — Z803 Family history of malignant neoplasm of breast: Secondary | ICD-10-CM | POA: Diagnosis not present

## 2019-08-03 DIAGNOSIS — N641 Fat necrosis of breast: Secondary | ICD-10-CM | POA: Insufficient documentation

## 2019-08-03 HISTORY — PX: BREAST RECONSTRUCTION WITH MASTOPLASTY: SHX6752

## 2019-08-03 LAB — POCT PREGNANCY, URINE: Preg Test, Ur: NEGATIVE

## 2019-08-03 SURGERY — BREAST RECONSTRUCTION WITH MASTOPLASTY
Anesthesia: General | Site: Chest | Laterality: Bilateral

## 2019-08-03 MED ORDER — HYDROMORPHONE HCL 1 MG/ML IJ SOLN
INTRAMUSCULAR | Status: AC
  Filled 2019-08-03: qty 0.5

## 2019-08-03 MED ORDER — FENTANYL CITRATE (PF) 100 MCG/2ML IJ SOLN
INTRAMUSCULAR | Status: AC
  Filled 2019-08-03: qty 2

## 2019-08-03 MED ORDER — TRAMADOL HCL 50 MG PO TABS: 50.0000 mg | ORAL_TABLET | Freq: Four times a day (QID) | ORAL | 0 refills | Status: DC | PRN

## 2019-08-03 MED ORDER — PROPOFOL 10 MG/ML IV BOLUS
INTRAVENOUS | Status: DC | PRN
  Administered 2019-08-03: 200 ug via INTRAVENOUS

## 2019-08-03 MED ORDER — ACETAMINOPHEN 500 MG PO TABS: 1000.0000 mg | ORAL_TABLET | ORAL | Status: DC

## 2019-08-03 MED ORDER — LACTATED RINGERS IV SOLN: INTRAVENOUS | Status: DC | PRN

## 2019-08-03 MED ORDER — MIDAZOLAM HCL 2 MG/2ML IJ SOLN
INTRAMUSCULAR | Status: AC
  Filled 2019-08-03: qty 2

## 2019-08-03 MED ORDER — KETOROLAC TROMETHAMINE 30 MG/ML IJ SOLN
INTRAMUSCULAR | Status: AC
  Filled 2019-08-03: qty 1

## 2019-08-03 MED ORDER — PROPOFOL 500 MG/50ML IV EMUL
INTRAVENOUS | Status: DC | PRN
  Administered 2019-08-03: 25 ug/kg/min via INTRAVENOUS

## 2019-08-03 MED ORDER — CEFAZOLIN SODIUM-DEXTROSE 2-4 GM/100ML-% IV SOLN
INTRAVENOUS | Status: AC
  Filled 2019-08-03: qty 100

## 2019-08-03 MED ORDER — ONDANSETRON HCL 4 MG/2ML IJ SOLN
INTRAMUSCULAR | Status: AC
  Filled 2019-08-03: qty 2

## 2019-08-03 MED ORDER — PROPOFOL 10 MG/ML IV BOLUS
INTRAVENOUS | Status: AC
  Filled 2019-08-03: qty 20

## 2019-08-03 MED ORDER — GABAPENTIN 300 MG PO CAPS
ORAL_CAPSULE | ORAL | Status: AC
  Filled 2019-08-03: qty 1

## 2019-08-03 MED ORDER — ACETAMINOPHEN 500 MG PO TABS
1000.0000 mg | ORAL_TABLET | Freq: Once | ORAL | Status: AC
  Administered 2019-08-03: 1000 mg via ORAL

## 2019-08-03 MED ORDER — FENTANYL CITRATE (PF) 100 MCG/2ML IJ SOLN
INTRAMUSCULAR | Status: DC | PRN
  Administered 2019-08-03: 50 ug via INTRAVENOUS
  Administered 2019-08-03: 25 ug via INTRAVENOUS
  Administered 2019-08-03: 50 ug via INTRAVENOUS
  Administered 2019-08-03: 100 ug via INTRAVENOUS
  Administered 2019-08-03: 25 ug via INTRAVENOUS

## 2019-08-03 MED ORDER — KETOROLAC TROMETHAMINE 30 MG/ML IJ SOLN: 30.0000 mg | Freq: Once | INTRAMUSCULAR | Status: DC | PRN

## 2019-08-03 MED ORDER — PROMETHAZINE HCL 25 MG/ML IJ SOLN
6.2500 mg | INTRAMUSCULAR | Status: DC | PRN
  Administered 2019-08-03: 6.25 mg via INTRAVENOUS

## 2019-08-03 MED ORDER — 0.9 % SODIUM CHLORIDE (POUR BTL) OPTIME
TOPICAL | Status: DC | PRN
  Administered 2019-08-03: 1000 mL

## 2019-08-03 MED ORDER — DEXAMETHASONE SODIUM PHOSPHATE 10 MG/ML IJ SOLN
INTRAMUSCULAR | Status: AC
  Filled 2019-08-03: qty 1

## 2019-08-03 MED ORDER — LIDOCAINE HCL (CARDIAC) PF 100 MG/5ML IV SOSY
PREFILLED_SYRINGE | INTRAVENOUS | Status: DC | PRN
  Administered 2019-08-03: 60 mg via INTRATRACHEAL

## 2019-08-03 MED ORDER — OXYCODONE HCL 5 MG PO TABS
5.0000 mg | ORAL_TABLET | Freq: Once | ORAL | Status: AC | PRN
  Administered 2019-08-03: 5 mg via ORAL

## 2019-08-03 MED ORDER — LIDOCAINE-EPINEPHRINE 1 %-1:100000 IJ SOLN
INTRAMUSCULAR | Status: AC
  Filled 2019-08-03: qty 1

## 2019-08-03 MED ORDER — MEPERIDINE HCL 25 MG/ML IJ SOLN: 6.2500 mg | INTRAMUSCULAR | Status: DC | PRN

## 2019-08-03 MED ORDER — BUPIVACAINE HCL (PF) 0.25 % IJ SOLN
INTRAMUSCULAR | Status: AC
  Filled 2019-08-03: qty 30

## 2019-08-03 MED ORDER — HYDROMORPHONE HCL 1 MG/ML IJ SOLN
0.2500 mg | INTRAMUSCULAR | Status: DC | PRN
  Administered 2019-08-03 (×2): 0.5 mg via INTRAVENOUS

## 2019-08-03 MED ORDER — DEXAMETHASONE SODIUM PHOSPHATE 4 MG/ML IJ SOLN
INTRAMUSCULAR | Status: DC | PRN
  Administered 2019-08-03: 10 mg via INTRAVENOUS

## 2019-08-03 MED ORDER — GABAPENTIN 300 MG PO CAPS
300.0000 mg | ORAL_CAPSULE | ORAL | Status: AC
  Administered 2019-08-03: 300 mg via ORAL

## 2019-08-03 MED ORDER — CEFAZOLIN SODIUM-DEXTROSE 2-4 GM/100ML-% IV SOLN: 2.0000 g | INTRAVENOUS | Status: DC

## 2019-08-03 MED ORDER — DIPHENHYDRAMINE HCL 50 MG/ML IJ SOLN
INTRAMUSCULAR | Status: DC | PRN
  Administered 2019-08-03: 12.5 mg via INTRAVENOUS

## 2019-08-03 MED ORDER — ACETAMINOPHEN 500 MG PO TABS
ORAL_TABLET | ORAL | Status: AC
  Filled 2019-08-03: qty 2

## 2019-08-03 MED ORDER — CEFAZOLIN SODIUM-DEXTROSE 2-3 GM-%(50ML) IV SOLR
INTRAVENOUS | Status: DC | PRN
  Administered 2019-08-03: 2 g via INTRAVENOUS

## 2019-08-03 MED ORDER — OXYCODONE HCL 5 MG/5ML PO SOLN: 5.0000 mg | Freq: Once | ORAL | Status: AC | PRN

## 2019-08-03 MED ORDER — LIDOCAINE 2% (20 MG/ML) 5 ML SYRINGE
INTRAMUSCULAR | Status: AC
  Filled 2019-08-03: qty 5

## 2019-08-03 MED ORDER — MIDAZOLAM HCL 2 MG/2ML IJ SOLN
INTRAMUSCULAR | Status: DC | PRN
  Administered 2019-08-03 (×3): 2 mg via INTRAVENOUS

## 2019-08-03 MED ORDER — CELECOXIB 200 MG PO CAPS
200.0000 mg | ORAL_CAPSULE | ORAL | Status: AC
  Administered 2019-08-03: 200 mg via ORAL

## 2019-08-03 MED ORDER — LACTATED RINGERS IV SOLN: INTRAVENOUS | Status: DC

## 2019-08-03 MED ORDER — PROMETHAZINE HCL 25 MG/ML IJ SOLN
INTRAMUSCULAR | Status: AC
  Filled 2019-08-03: qty 1

## 2019-08-03 MED ORDER — BUPIVACAINE HCL (PF) 0.25 % IJ SOLN
INTRAMUSCULAR | Status: DC | PRN
  Administered 2019-08-03: 30 mL

## 2019-08-03 MED ORDER — CELECOXIB 200 MG PO CAPS
ORAL_CAPSULE | ORAL | Status: AC
  Filled 2019-08-03: qty 1

## 2019-08-03 MED ORDER — ONDANSETRON HCL 4 MG/2ML IJ SOLN
INTRAMUSCULAR | Status: DC | PRN
  Administered 2019-08-03: 4 mg via INTRAVENOUS

## 2019-08-03 MED ORDER — OXYCODONE HCL 5 MG PO TABS
ORAL_TABLET | ORAL | Status: AC
  Filled 2019-08-03: qty 1

## 2019-08-03 SURGICAL SUPPLY — 106 items
ADH SKN CLS APL DERMABOND .7 (GAUZE/BANDAGES/DRESSINGS) ×8
APL PRP STRL LF DISP 70% ISPRP (MISCELLANEOUS) ×4
BAG DECANTER FOR FLEXI CONT (MISCELLANEOUS) ×1 IMPLANT
BALL CTTN LRG ABS STRL LF (GAUZE/BANDAGES/DRESSINGS)
BINDER BREAST LRG (GAUZE/BANDAGES/DRESSINGS) IMPLANT
BINDER BREAST MEDIUM (GAUZE/BANDAGES/DRESSINGS) IMPLANT
BINDER BREAST XLRG (GAUZE/BANDAGES/DRESSINGS) IMPLANT
BINDER BREAST XXLRG (GAUZE/BANDAGES/DRESSINGS) IMPLANT
BLADE CLIPPER SURG (BLADE) IMPLANT
BLADE SURG 10 STRL SS (BLADE) ×4 IMPLANT
BLADE SURG 15 STRL LF DISP TIS (BLADE) ×1 IMPLANT
BLADE SURG 15 STRL SS (BLADE)
BNDG ELASTIC 6X5.8 VLCR STR LF (GAUZE/BANDAGES/DRESSINGS) IMPLANT
BNDG GAUZE ELAST 4 BULKY (GAUZE/BANDAGES/DRESSINGS) ×2 IMPLANT
CANISTER SUCT 1200ML W/VALVE (MISCELLANEOUS) ×4 IMPLANT
CHLORAPREP W/TINT 26 (MISCELLANEOUS) ×7 IMPLANT
CLOSURE WOUND 1/2 X4 (GAUZE/BANDAGES/DRESSINGS)
CLOSURE WOUND 1/4X4 (GAUZE/BANDAGES/DRESSINGS)
COTTONBALL LRG STERILE PKG (GAUZE/BANDAGES/DRESSINGS) IMPLANT
COVER BACK TABLE 60X90IN (DRAPES) ×4 IMPLANT
COVER MAYO STAND STRL (DRAPES) ×5 IMPLANT
COVER WAND RF STERILE (DRAPES) IMPLANT
DECANTER SPIKE VIAL GLASS SM (MISCELLANEOUS) IMPLANT
DERMABOND ADVANCED (GAUZE/BANDAGES/DRESSINGS) ×8
DERMABOND ADVANCED .7 DNX12 (GAUZE/BANDAGES/DRESSINGS) ×5 IMPLANT
DRAIN CHANNEL 15F RND FF W/TCR (WOUND CARE) ×1 IMPLANT
DRAPE INCISE IOBAN 66X45 STRL (DRAPES) IMPLANT
DRAPE TOP ARMCOVERS (MISCELLANEOUS) ×4 IMPLANT
DRAPE U-SHAPE 76X120 STRL (DRAPES) ×4 IMPLANT
DRAPE UTILITY XL STRL (DRAPES) ×5 IMPLANT
DRSG PAD ABDOMINAL 8X10 ST (GAUZE/BANDAGES/DRESSINGS) ×14 IMPLANT
ELECT BLADE 4.0 EZ CLEAN MEGAD (MISCELLANEOUS)
ELECT COATED BLADE 2.86 ST (ELECTRODE) ×4 IMPLANT
ELECT NDL BLADE 2-5/6 (NEEDLE) ×1 IMPLANT
ELECT NEEDLE BLADE 2-5/6 (NEEDLE) IMPLANT
ELECT REM PT RETURN 9FT ADLT (ELECTROSURGICAL) ×4
ELECT REM PT RETURN 9FT PED (ELECTROSURGICAL)
ELECTRODE BLDE 4.0 EZ CLN MEGD (MISCELLANEOUS) ×1 IMPLANT
ELECTRODE REM PT RETRN 9FT PED (ELECTROSURGICAL) IMPLANT
ELECTRODE REM PT RTRN 9FT ADLT (ELECTROSURGICAL) ×2 IMPLANT
EVACUATOR SILICONE 100CC (DRAIN) ×1 IMPLANT
GAUZE XEROFORM 1X8 LF (GAUZE/BANDAGES/DRESSINGS) IMPLANT
GAUZE XEROFORM 5X9 LF (GAUZE/BANDAGES/DRESSINGS) IMPLANT
GLOVE BIO SURGEON STRL SZ 6 (GLOVE) ×8 IMPLANT
GLOVE BIO SURGEON STRL SZ 6.5 (GLOVE) IMPLANT
GLOVE BIO SURGEON STRL SZ7 (GLOVE) ×6 IMPLANT
GLOVE BIO SURGEONS STRL SZ 6.5 (GLOVE)
GLOVE BIOGEL PI IND STRL 6.5 (GLOVE) IMPLANT
GLOVE BIOGEL PI IND STRL 7.0 (GLOVE) ×2 IMPLANT
GLOVE BIOGEL PI IND STRL 7.5 (GLOVE) ×1 IMPLANT
GLOVE BIOGEL PI INDICATOR 6.5 (GLOVE)
GLOVE BIOGEL PI INDICATOR 7.0 (GLOVE) ×4
GLOVE BIOGEL PI INDICATOR 7.5 (GLOVE) ×2
GOWN STRL REUS W/ TWL LRG LVL3 (GOWN DISPOSABLE) ×5 IMPLANT
GOWN STRL REUS W/TWL LRG LVL3 (GOWN DISPOSABLE) ×7 IMPLANT
IV NS 500ML (IV SOLUTION)
IV NS 500ML BAXH (IV SOLUTION) ×1 IMPLANT
KIT FILL SYSTEM UNIVERSAL (SET/KITS/TRAYS/PACK) IMPLANT
MARKER SKIN DUAL TIP RULER LAB (MISCELLANEOUS) IMPLANT
NDL FILTER BLUNT 18X1 1/2 (NEEDLE) IMPLANT
NDL HYPO 25X1 1.5 SAFETY (NEEDLE) IMPLANT
NDL HYPO 30GX1 BEV (NEEDLE) IMPLANT
NDL PRECISIONGLIDE 27X1.5 (NEEDLE) IMPLANT
NEEDLE FILTER BLUNT 18X 1/2SAF (NEEDLE)
NEEDLE FILTER BLUNT 18X1 1/2 (NEEDLE) IMPLANT
NEEDLE HYPO 25X1 1.5 SAFETY (NEEDLE) ×4 IMPLANT
NEEDLE HYPO 30GX1 BEV (NEEDLE) IMPLANT
NEEDLE PRECISIONGLIDE 27X1.5 (NEEDLE) IMPLANT
PACK BASIN DAY SURGERY FS (CUSTOM PROCEDURE TRAY) ×5 IMPLANT
PENCIL SMOKE EVACUATOR (MISCELLANEOUS) ×5 IMPLANT
PIN SAFETY STERILE (MISCELLANEOUS) ×1 IMPLANT
SHEET MEDIUM DRAPE 40X70 STRL (DRAPES) ×8 IMPLANT
SLEEVE SCD COMPRESS KNEE MED (MISCELLANEOUS) ×4 IMPLANT
SPONGE GAUZE 2X2 8PLY STER LF (GAUZE/BANDAGES/DRESSINGS)
SPONGE GAUZE 2X2 8PLY STRL LF (GAUZE/BANDAGES/DRESSINGS) IMPLANT
SPONGE LAP 18X18 RF (DISPOSABLE) ×8 IMPLANT
STAPLER VISISTAT 35W (STAPLE) ×4 IMPLANT
STRIP CLOSURE SKIN 1/2X4 (GAUZE/BANDAGES/DRESSINGS) IMPLANT
STRIP CLOSURE SKIN 1/4X4 (GAUZE/BANDAGES/DRESSINGS) IMPLANT
SUT ETHILON 2 0 FS 18 (SUTURE) IMPLANT
SUT MNCRL AB 3-0 PS2 18 (SUTURE) IMPLANT
SUT MNCRL AB 4-0 PS2 18 (SUTURE) ×6 IMPLANT
SUT PDS 3-0 CT2 (SUTURE)
SUT PDS AB 2-0 CT2 27 (SUTURE) ×12 IMPLANT
SUT PDS II 3-0 CT2 27 ABS (SUTURE) IMPLANT
SUT PLAIN 5 0 P 3 18 (SUTURE) IMPLANT
SUT PROLENE 5 0 P 3 (SUTURE) IMPLANT
SUT PROLENE 5 0 PS 2 (SUTURE) IMPLANT
SUT PROLENE 6 0 P 1 18 (SUTURE) IMPLANT
SUT VIC AB 3-0 PS1 18 (SUTURE) ×16
SUT VIC AB 3-0 PS1 18XBRD (SUTURE) ×4 IMPLANT
SUT VIC AB 3-0 SH 27 (SUTURE)
SUT VIC AB 3-0 SH 27X BRD (SUTURE) ×1 IMPLANT
SUT VICRYL 4-0 PS2 18IN ABS (SUTURE) ×6 IMPLANT
SWABSTICK POVIDONE IODINE SNGL (MISCELLANEOUS) IMPLANT
SYR 20ML LL LF (SYRINGE) IMPLANT
SYR 50ML LL SCALE MARK (SYRINGE) IMPLANT
SYR BULB 3OZ (MISCELLANEOUS) IMPLANT
SYR BULB IRRIGATION 50ML (SYRINGE) ×4 IMPLANT
SYR CONTROL 10ML LL (SYRINGE) ×4 IMPLANT
TOWEL GREEN STERILE FF (TOWEL DISPOSABLE) ×6 IMPLANT
TRAY DSU PREP LF (CUSTOM PROCEDURE TRAY) IMPLANT
TUBE CONNECTING 20'X1/4 (TUBING) ×1
TUBE CONNECTING 20X1/4 (TUBING) ×3 IMPLANT
UNDERPAD 30X36 HEAVY ABSORB (UNDERPADS AND DIAPERS) ×8 IMPLANT
YANKAUER SUCT BULB TIP NO VENT (SUCTIONS) ×4 IMPLANT

## 2019-08-03 NOTE — Anesthesia Preprocedure Evaluation (Addendum)
Anesthesia Evaluation  Patient identified by MRN, date of birth, ID band Patient awake  General Assessment Comment:PONV- agitation with scopolamine patch  Reviewed: Allergy & Precautions, NPO status , Patient's Chart, lab work & pertinent test results  History of Anesthesia Complications (+) PONV and history of anesthetic complications  Airway Mallampati: I  TM Distance: >3 FB Neck ROM: Full    Dental no notable dental hx. (+) Teeth Intact, Dental Advisory Given   Pulmonary neg pulmonary ROS,    Pulmonary exam normal breath sounds clear to auscultation       Cardiovascular negative cardio ROS Normal cardiovascular exam Rhythm:Regular Rate:Normal  ECG: ST, rate 104   Neuro/Psych  Headaches, Seizures -, Well Controlled,  head injury/post-concussive syndrome- one seizure only negative psych ROS   GI/Hepatic negative GI ROS, Neg liver ROS,   Endo/Other  negative endocrine ROS  Renal/GU negative Renal ROS  negative genitourinary   Musculoskeletal negative musculoskeletal ROS (+)   Abdominal Normal abdominal exam  (+) - obese,   Peds  Hematology  (+) anemia ,   Anesthesia Other Findings Right mastectomy wound s/p right breast ca- s/p multiple surgeries  Reproductive/Obstetrics negative OB ROS                            Anesthesia Physical  Anesthesia Plan  ASA: II  Anesthesia Plan: General   Post-op Pain Management:    Induction: Intravenous  PONV Risk Score and Plan: 4 or greater and Midazolam, Dexamethasone, Ondansetron, Treatment may vary due to age or medical condition, Propofol infusion and Metaclopromide  Airway Management Planned: LMA  Additional Equipment: None  Intra-op Plan:   Post-operative Plan: Extubation in OR  Informed Consent: I have reviewed the patients History and Physical, chart, labs and discussed the procedure including the risks, benefits and alternatives  for the proposed anesthesia with the patient or authorized representative who has indicated his/her understanding and acceptance.     Dental advisory given  Plan Discussed with: CRNA  Anesthesia Plan Comments: (Does not do well with scopolamine or reglan. Does well with phenergan, steroid, benzos for PONV. Did well with PONV after last mastectomy 11/06/2018. Has had chronic pain issues after mastectomies- does well with dilaudid, oxycodone.)       Anesthesia Quick Evaluation

## 2019-08-03 NOTE — Op Note (Signed)
Operative Note   DATE OF OPERATION: 2.2.2021  LOCATION: [facility]-outpatient  SURGICAL DIVISION: Plastic Surgery  PREOPERATIVE DIAGNOSES:  1. Acquired absence breasts 2. History breast cancer   POSTOPERATIVE DIAGNOSES:  same  PROCEDURE:  Complex repair chest 50 cm  SURGEON: Irene Limbo MD MBA  ASSISTANT: none  ANESTHESIA:  General.   EBL: [id number] ml  COMPLICATIONS: None immediate.   INDICATIONS FOR PROCEDURE:  The patient, Holly Wiley, is a 44 y.o. female born on July 16, 1975, is here for excision redundant soft tissue following bilateral skin reduction mastectomies. Course complicated by right chest infection that healed secondarily.    FINDINGS: Benign appearing soft tissue bilateral without masses.   DESCRIPTION OF PROCEDURE:  The patient's operative site was marked with the patient in the preoperative area in sitting position to mark areas of redundant tissue. The patient was taken to the operating room. SCDs were placed and IV antibiotics were given. The patient's operative site was prepped and draped in a sterile fashion. A time out was performed and all information was confirmed to be correct. Sharp skin incision completed over marked areas and skin and subcutaneous tissue excised to pectoralis muscle and serratus muscle bilateral. Soft tissue elevated off underlying muscle. Over right chest encountered thick tethered scar in area prior incision and drainage. Wound irrigated. Hemostasis obtained. Local anesthetic infiltrated. Layered closure completed with 2-0 PDS in superficial fascia, short running 3-0 vicryl in dermis, and 4-0 monocryl subcuticular skin closure. Total length excision and closure 50 cm. Dermabond applied. Dry dressing and breast binder applied.   The patient was allowed to wake from anesthesia, extubated and taken to the recovery room in satisfactory condition.   SPECIMENS: right and left mastectomy flap  DRAINS: none

## 2019-08-03 NOTE — Transfer of Care (Signed)
Immediate Anesthesia Transfer of Care Note  Patient: Holly Wiley  Procedure(s) Performed: Revision bilateral mastectomy flaps (Bilateral Chest)  Patient Location: PACU  Anesthesia Type:General  Level of Consciousness: awake, alert  and oriented  Airway & Oxygen Therapy: Patient connected to nasal cannula oxygen  Post-op Assessment: Report given to RN and Post -op Vital signs reviewed and stable  Post vital signs: Reviewed and stable  Last Vitals:  Vitals Value Taken Time  BP    Temp    Pulse 83 08/03/19 1130  Resp    SpO2 100 % 08/03/19 1130  Vitals shown include unvalidated device data.  Last Pain:  Vitals:   08/03/19 0753  TempSrc: Temporal  PainSc: 5          Complications: No apparent anesthesia complications

## 2019-08-03 NOTE — Anesthesia Postprocedure Evaluation (Signed)
Anesthesia Post Note  Patient: Holly Wiley  Procedure(s) Performed: Revision bilateral mastectomy flaps (Bilateral Chest)     Patient location during evaluation: PACU Anesthesia Type: General Level of consciousness: awake and alert, oriented and patient cooperative Pain management: pain level controlled Vital Signs Assessment: post-procedure vital signs reviewed and stable Respiratory status: spontaneous breathing, nonlabored ventilation and respiratory function stable Cardiovascular status: blood pressure returned to baseline and stable Postop Assessment: no apparent nausea or vomiting Anesthetic complications: no Comments: Extremely anxious preop requiring 6mg  versed    Last Vitals:  Vitals:   08/03/19 0900 08/03/19 1130  BP: 102/65 112/74  Pulse: 73 83  Resp: 13 11  Temp:  36.4 C  SpO2: 98% 100%    Last Pain:  Vitals:   08/03/19 1130  TempSrc:   PainSc: 0-No pain                 Pervis Hocking

## 2019-08-03 NOTE — H&P (Signed)
  Subjective:     Patient ID: Holly Wiley is a 44 y.o. female.    Here for follow up discussion breast reconstruction. Weight down 16 lb since Jan 2020  Presented with palpable lump 12/2017, MMG/US completed at that time normal. Screening MMG 05/2018 showed possible right breast mass. Diagnostic MMG/US 1.1 cm mass in the right breast at 10 o'clock position, no abnormal LN. Biopsy demonstrated IDC, triple negative.   MRI showed known malignancy right UOQ 1.4 cm. Within the left LIQ, a 0.6 x 0.7 x 0.7 cm noted. Biopsy of this was benign.  Completed neoadjuvant chemotherapy. Underwent bilateral mastectomies with skin reduction pattern 11/06/18- final pathology no residual carcinoma, 0/3 SLN.   Course complicated by right chest abscess underwent debridement 3 weeks post op culture S Aureus and wound healed on own. Has had chronic pain since surgery and continues in PT. Tried injections, lyrica, and most recently ketamine infusions without relief. Using sleeve for compression arm and compression garment right chest.  Reports every week drainage from right chest then heals. Dr. Dalbert Batman has recommended visit here to discuss treatment of this. Patient herself does not desire use prosthesis nor have plans for any reconstruction and desires excision redundant soft tissue chest.  Mother with history breast ca. 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 SPINK1 is associated with autosomal recessive hereditary pancreatitis.VUS inAPCgene also noted.  Bone scan 12/20 negative. Underwent recent left chest biopsy for mass, read as fat necrosis  Prior 42H, desires much smaller. 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 Accompanied by mother who had breast cancer and mastectomy no reconstruction.      Objective:   Physical Exam   Constitutional: She is oriented to person, place, and time.  Cardiovascular: Normal rate, regular rhythm and normal heart sounds.  Pulmonary/Chest: Effort normal and breath sounds normal.  Neurological: She is alert and oriented to person, place, and time.   Chest: bilateral mastectomies with T shaped scars and bilateral redundant soft tissue, left chest at T junction nodular area- this was area biopsied as fat necrosis Right chest area where wound opened and healed secondarily is adherent chest no present drainage    Assessment:     Right breast ca UOQ ER- Neoadjuvant chemotherapy S/p bilateral SRM S/p debridement right chest abscess    Plan:     Over right chest suspect chronic seroma cavity leading to recurrent drainage and would recommend excision of this. Reviewed the redundant soft tissue would be useful if any reconstruction planned however if no plans for this can plan excision bilateral chest soft tissue redundancy- her incisions nearly meet in middle presently and anticipate with additional soft tissue excision would have scar straight line that traverses from across entire chest. Reviewed OP surgery, current COVID restrictions, possible drain.   She will let me know what if any pain medication she desires for post op use at time of surgery.

## 2019-08-03 NOTE — Anesthesia Procedure Notes (Signed)
Procedure Name: LMA Insertion Performed by: Melaya Hoselton M, CRNA Pre-anesthesia Checklist: Patient identified, Emergency Drugs available, Suction available and Patient being monitored Patient Re-evaluated:Patient Re-evaluated prior to induction Oxygen Delivery Method: Circle system utilized Preoxygenation: Pre-oxygenation with 100% oxygen Induction Type: IV induction Ventilation: Mask ventilation without difficulty LMA: LMA inserted LMA Size: 4.0 Number of attempts: 1 Airway Equipment and Method: Bite block Placement Confirmation: positive ETCO2 Tube secured with: Tape Dental Injury: Teeth and Oropharynx as per pre-operative assessment        

## 2019-08-03 NOTE — Discharge Instructions (Signed)
No Tylenol until 2pm, No ibuprofen until 4pm Post Anesthesia Home Care Instructions   Activity: Get plenty of rest for the remainder of the day. A responsible individual must stay with you for 24 hours following the procedure.  For the next 24 hours, DO NOT: -Drive a car -Paediatric nurse -Drink alcoholic beverages -Take any medication unless instructed by your physician -Make any legal decisions or sign important papers.  Meals: Start with liquid foods such as gelatin or soup. Progress to regular foods as tolerated. Avoid greasy, spicy, heavy foods. If nausea and/or vomiting occur, drink only clear liquids until the nausea and/or vomiting subsides. Call your physician if vomiting continues.  Special Instructions/Symptoms: Your throat may feel dry or sore from the anesthesia or the breathing tube placed in your throat during surgery. If this causes discomfort, gargle with warm salt water. The discomfort should disappear within 24 hours.  If you had a scopolamine patch placed behind your ear for the management of post- operative nausea and/or vomiting:  1. The medication in the patch is effective for 72 hours, after which it should be removed.  Wrap patch in a tissue and discard in the trash. Wash hands thoroughly with soap and water. 2. You may remove the patch earlier than 72 hours if you experience unpleasant side effects which may include dry mouth, dizziness or visual disturbances. 3. Avoid touching the patch. Wash your hands with soap and water after contact with the patch.

## 2019-08-04 ENCOUNTER — Encounter: Payer: Self-pay | Admitting: *Deleted

## 2019-08-04 LAB — SURGICAL PATHOLOGY

## 2019-08-13 DIAGNOSIS — G894 Chronic pain syndrome: Secondary | ICD-10-CM | POA: Insufficient documentation

## 2019-08-13 HISTORY — DX: Chronic pain syndrome: G89.4

## 2019-11-15 DIAGNOSIS — G5602 Carpal tunnel syndrome, left upper limb: Secondary | ICD-10-CM | POA: Insufficient documentation

## 2020-01-10 ENCOUNTER — Telehealth: Payer: Self-pay | Admitting: Hematology and Oncology

## 2020-01-10 NOTE — Telephone Encounter (Signed)
Scheduled appt per 7/12 sch msg - pt is aware of appt date and time   

## 2020-01-19 LAB — HM COLONOSCOPY

## 2020-01-19 NOTE — Progress Notes (Signed)
Patient Care Team: [name], NP as PCP - General (Nurse Practitioner) Nicholas Lose, MD as Consulting Physician (Hematology and Oncology) Fanny Skates, MD as Consulting Physician (General Surgery)  DIAGNOSIS:    ICD-10-CM   1. Malignant neoplasm of upper-outer quadrant of right breast in female, estrogen receptor negative ([place])  C50.411    Z17.1     SUMMARY OF ONCOLOGIC HISTORY: Oncology History  Malignant neoplasm of upper-outer quadrant of right breast in female, estrogen receptor negative ([place])  06/22/2018 Initial Diagnosis   Screening mammogram detected right breast mass 1.1 cm at 10 o'clock position right breast 12 cm from the nipple, no abnormal lymph nodes, biopsy of the mass revealed IDC with DCIS grade 3, ER 0%, PR 0%, HER-2 -1+ by IHC, Ki-67 40%, T1c N0 stage Ib   06/29/2018 Cancer Staging   Staging form: Breast, AJCC 8th Edition - Clinical stage from 06/29/2018: Stage IB (cT1c, cN0, cM0, G3, ER-, PR-, HER2-) - Signed by Nicholas Lose, MD on 06/29/2018   07/09/2018 - 10/01/2018 Neo-Adjuvant Chemotherapy   Neo- Adjuvant chemotherapy with dose dense Adriamycin and Cytoxan x4 followed by Taxol and carboplatin weekly x5 (cycles 6-12 canceled due to extreme fatigue)   07/23/2018 Genetic Testing   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  A variant of uncertain significance (VUS) in a gene called APC was also noted. (c.7105C>T). Genes tested: AIP, ALK, APC*, ATM*, AXIN2, BAP1, BARD1, BLM, BMPR1A, BRCA1*, BRCA2*, BRIP1*, CDC73, CDH1*, CDK4, CDKN1B, CDKN2A, CHEK2*, CTNNA1, DICER1, FANCC, FH, FLCN, GALNT12, HOXB13, KIT, MAX, MEN1, MET, MLH1*, MRE11A, MSH2*, MSH6*, MUTYH*, NBN, NF1*, NF2, NTHL1, PALB2*, PDGFRA, PHOX2B, PMS2*, POLD1, POLE, POT1, PRKAR1A, PTCH1, PTEN*, RAD50, RAD51C*, RAD51D*, RB1, RET, SDHA, SDHAF2, SDHB, SDHC, SDHD, SMAD4, SMARCA4, SMARCB1, SMARCE1, STK11,  SUFU, TMEM127, TP53*, TSC1, TSC2, VHL and XRCC2 (sequencing and deletion/duplication); CASR, CFTR, CPA1, CTRC, EGFR, MITF, PRSS1 and SPINK1 (sequencing only); EPCAM and GREM1 (deletion/duplication only). DNA and RNA analyses performed for * genes.   11/06/2018 Surgery   Left mastectomy: Complete response, 0/3 lymph nodes negative   11/06/2018 Cancer Staging   Staging form: Breast, AJCC 8th Edition - Pathologic stage from 11/06/2018: No Stage Recommended (ypT0, pN0, cM0) - Signed by Gardenia Phlegm, NP on 01/25/2019     CHIEF COMPLIANT: Follow-up of triple negative right breast on surveillance   INTERVAL HISTORY: Holly Wiley is a 44 y.o. with above-mentioned history of triple negative right breast cancer who underwent neoadjuvant chemotherapy, bilateral mastectomies, and is currently on surveillance. US of the left axilla on 05/13/19 showed a 1.2cm mass at the surgical scar. Biopsy on 05/17/19 showed fat necrosis and no evidence of malignancy. Bone scan on 05/28/19 showed no evidence of osseous metastases. She presents to the clinic today for follow-up.  Her major complaint is severe itching of the right chest wall and axilla.  She has tried everything under the sun and nothing has helped her.  She is completely at her wits end for this.  ALLERGIES:  is allergic to bee venom, dihydroergotamine, imitrex [sumatriptan], metoclopramide, and transderm-scop [scopolamine].  MEDICATIONS:  Current Outpatient Medications  Medication Sig Dispense Refill  . DULoxetine (CYMBALTA) 30 MG capsule Take 1 capsule (30 mg total) by mouth daily. Take 1 tab daily, then increase to 1 tab bid after 1 week 60 capsule 0  . Multiple Vitamin (MULTIVITAMIN WITH MINERALS) TABS tablet Take 1 tablet by mouth daily.    . ondansetron (  ZOFRAN-ODT) 4 MG disintegrating tablet Take 1 tablet (4 mg total) by mouth every 6 (six) hours as needed for nausea. 20 tablet 1   No current facility-administered medications for this  visit.   Facility-Administered Medications Ordered in Other Visits  Medication Dose Route Frequency Provider Last Rate Last Admin  . sodium chloride flush (NS) 0.9 % injection 10 mL  10 mL Intracatheter PRN Nicholas Lose, MD        PHYSICAL EXAMINATION: ECOG PERFORMANCE STATUS: 1 - Symptomatic but completely ambulatory  Vitals:   01/20/20 0844  BP: 109/73  Pulse: 86  Resp: 16  Temp: 98.3 F (36.8 C)  SpO2: 99%   Filed Weights   01/20/20 0844  Weight: 187 lb 14.4 oz (85.2 kg)    BREAST: No palpable masses or nodules in either right or left breasts. No palpable axillary supraclavicular or infraclavicular adenopathy no breast tenderness or nipple discharge. (exam performed in the presence of a chaperone)  LABORATORY DATA:  I have reviewed the data as listed CMP Latest Ref Rng & Units 12/01/2018 11/29/2018 11/28/2018  Glucose 70 - 99 mg/dL 104(H) 110(H) 91  BUN 6 - 20 mg/dL <5(L) 6 <5(L)  Creatinine 0.44 - 1.00 mg/dL 0.75 0.62 0.66  Sodium 135 - 145 mmol/L 138 138 138  Potassium 3.5 - 5.1 mmol/L 4.0 3.1(L) 4.0  Chloride 98 - 111 mmol/L 100 103 100  CO2 22 - 32 mmol/L '27 24 26  ' Calcium 8.9 - 10.3 mg/dL 8.8(L) 8.3(L) 9.2  Total Protein 6.5 - 8.1 g/dL - - -  Total Bilirubin 0.3 - 1.2 mg/dL - - -  Alkaline Phos 38 - 126 U/L - - -  AST 15 - 41 U/L - - -  ALT 0 - 44 U/L - - -    Lab Results  Component Value Date   WBC 6.1 11/29/2018   HGB 8.9 (L) 11/29/2018   HCT 27.8 (L) 11/29/2018   MCV 103.0 (H) 11/29/2018   PLT 164 11/29/2018   NEUTROABS 7.0 11/26/2018    ASSESSMENT & PLAN:  Malignant neoplasm of upper-outer quadrant of right breast in female, estrogen receptor negative ([place]) 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%, T1c N0 stage Ib  Recommendation: 1.Neo-Adjuvant  chemotherapy with dose dense Adriamycin and Cytoxan x4 followed by Taxol and carboplatinweekly x12 2.Right mastectomy: 11/06/2018: Pathologic complete response Followed by surveillance --------------------------------------------------------------------------------------------------------------------------------- Severe intractable Axillary itching: She has tried everything to stop the itching.  She has tried steroids, Benadryl, essential oils, CBD oils, allergy medications, ketamine drips but none of them appear to be working.  She is continuously itching throughout and there is no skin rash to explain the cause of the itching.  It is felt to be related to cutaneous nerves.  It could also be characterized as phantom pains.  She also tried gabapentin and she is currently on Cymbalta. We discussed that she could consider doing acupuncture. I will also ask of our radiation colleagues have any other options for stopping her itching.  Severe stress: Her past father passed away last month and her mother was extremely active fell down and broke her jaw and is currently nonambulatory.  She has been helping both her mother and her father for the past year.  She appears to be exhausted from taking care  of them.  Breast cancer surveillance: 1.  Breast exam: Bilateral mastectomies with scar from one end of the chest to the other 2. ultrasound left breast mastectomy scar: 1.2 cm hypoechoic scar: Fat necrosis  Return to clinic in 1 year for follow-up    No orders of the defined types were placed in this encounter.  The patient has a good understanding of the overall plan. she agrees with it. she will call with any problems that may develop before the next visit here.  Total time spent: 20 mins including face to face time and time spent for planning, charting and coordination of care  Nicholas Lose, MD 01/20/2020  I, Cloyde Reams Dorshimer, am acting as scribe for Dr. Nicholas Lose.  I have reviewed the above  documentation for accuracy and completeness, and I agree with the above.

## 2020-01-19 NOTE — Assessment & Plan Note (Signed)
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%, T1c N0 stage Ib  Recommendation: 1.Neo-Adjuvant chemotherapy with dose dense Adriamycin and Cytoxan x4 followed by Taxol and carboplatinweekly x12 2.Right mastectomy: 11/06/2018: Pathologic complete response Followed by surveillance --------------------------------------------------------------------------------------------------------------------------------- Severe intractable Axillary Pain: on Gabapentin. Nerve Block is being considered. Patient also takes tramadol.   Breast cancer surveillance: 1.  Breast exam: Bilateral mastectomies with reconstruction 2. ultrasound left breast mastectomy scar: 1.2 cm hypoechoic scar: Fat necrosis  Return to clinic in 1 year for follow-up

## 2020-01-20 ENCOUNTER — Inpatient Hospital Stay: Payer: BC Managed Care – PPO | Attending: Hematology and Oncology | Admitting: Hematology and Oncology

## 2020-01-20 ENCOUNTER — Other Ambulatory Visit: Payer: Self-pay

## 2020-01-20 DIAGNOSIS — Z171 Estrogen receptor negative status [ER-]: Secondary | ICD-10-CM | POA: Diagnosis not present

## 2020-01-20 DIAGNOSIS — Z9013 Acquired absence of bilateral breasts and nipples: Secondary | ICD-10-CM | POA: Insufficient documentation

## 2020-01-20 DIAGNOSIS — Z9221 Personal history of antineoplastic chemotherapy: Secondary | ICD-10-CM | POA: Diagnosis not present

## 2020-01-20 DIAGNOSIS — Z923 Personal history of irradiation: Secondary | ICD-10-CM | POA: Diagnosis not present

## 2020-01-20 DIAGNOSIS — Z853 Personal history of malignant neoplasm of breast: Secondary | ICD-10-CM | POA: Insufficient documentation

## 2020-01-20 DIAGNOSIS — C50411 Malignant neoplasm of upper-outer quadrant of right female breast: Secondary | ICD-10-CM

## 2020-01-21 ENCOUNTER — Telehealth: Payer: Self-pay | Admitting: Hematology and Oncology

## 2020-01-21 NOTE — Telephone Encounter (Signed)
Scheduled appts per 7/22 los. Pt confirmed appt date and time

## 2020-01-24 ENCOUNTER — Ambulatory Visit: Payer: BC Managed Care – PPO | Admitting: Hematology and Oncology

## 2020-02-21 ENCOUNTER — Other Ambulatory Visit: Payer: Self-pay | Admitting: Hematology and Oncology

## 2020-02-21 MED ORDER — PREGABALIN 50 MG PO CAPS: 50.0000 mg | ORAL_CAPSULE | Freq: Two times a day (BID) | ORAL | 3 refills | Status: DC

## 2020-02-21 NOTE — Progress Notes (Signed)
Patient is itching has not improved with multiple treatment options. We are sending a prescription for Lyrica today. She will take 50 mg twice a day.

## 2020-03-16 ENCOUNTER — Encounter: Payer: Self-pay | Admitting: Genetic Counselor

## 2020-03-16 NOTE — Progress Notes (Signed)
UPDATE: APC [id number] VUS has been reclassified to Likely Benign.  The reclassification date is 03/13/2020.

## 2020-04-21 ENCOUNTER — Encounter: Payer: Self-pay | Admitting: Hematology and Oncology

## 2020-05-27 IMAGING — CR DG CHEST 1V PORT
1 series · 1 of 1 positions shown · non-contrast
Comparison: 08/26/2017

CLINICAL DATA: Status post Port-A-Cath insertion.

EXAM:
PORTABLE CHEST 1 VIEW

[chest ap]
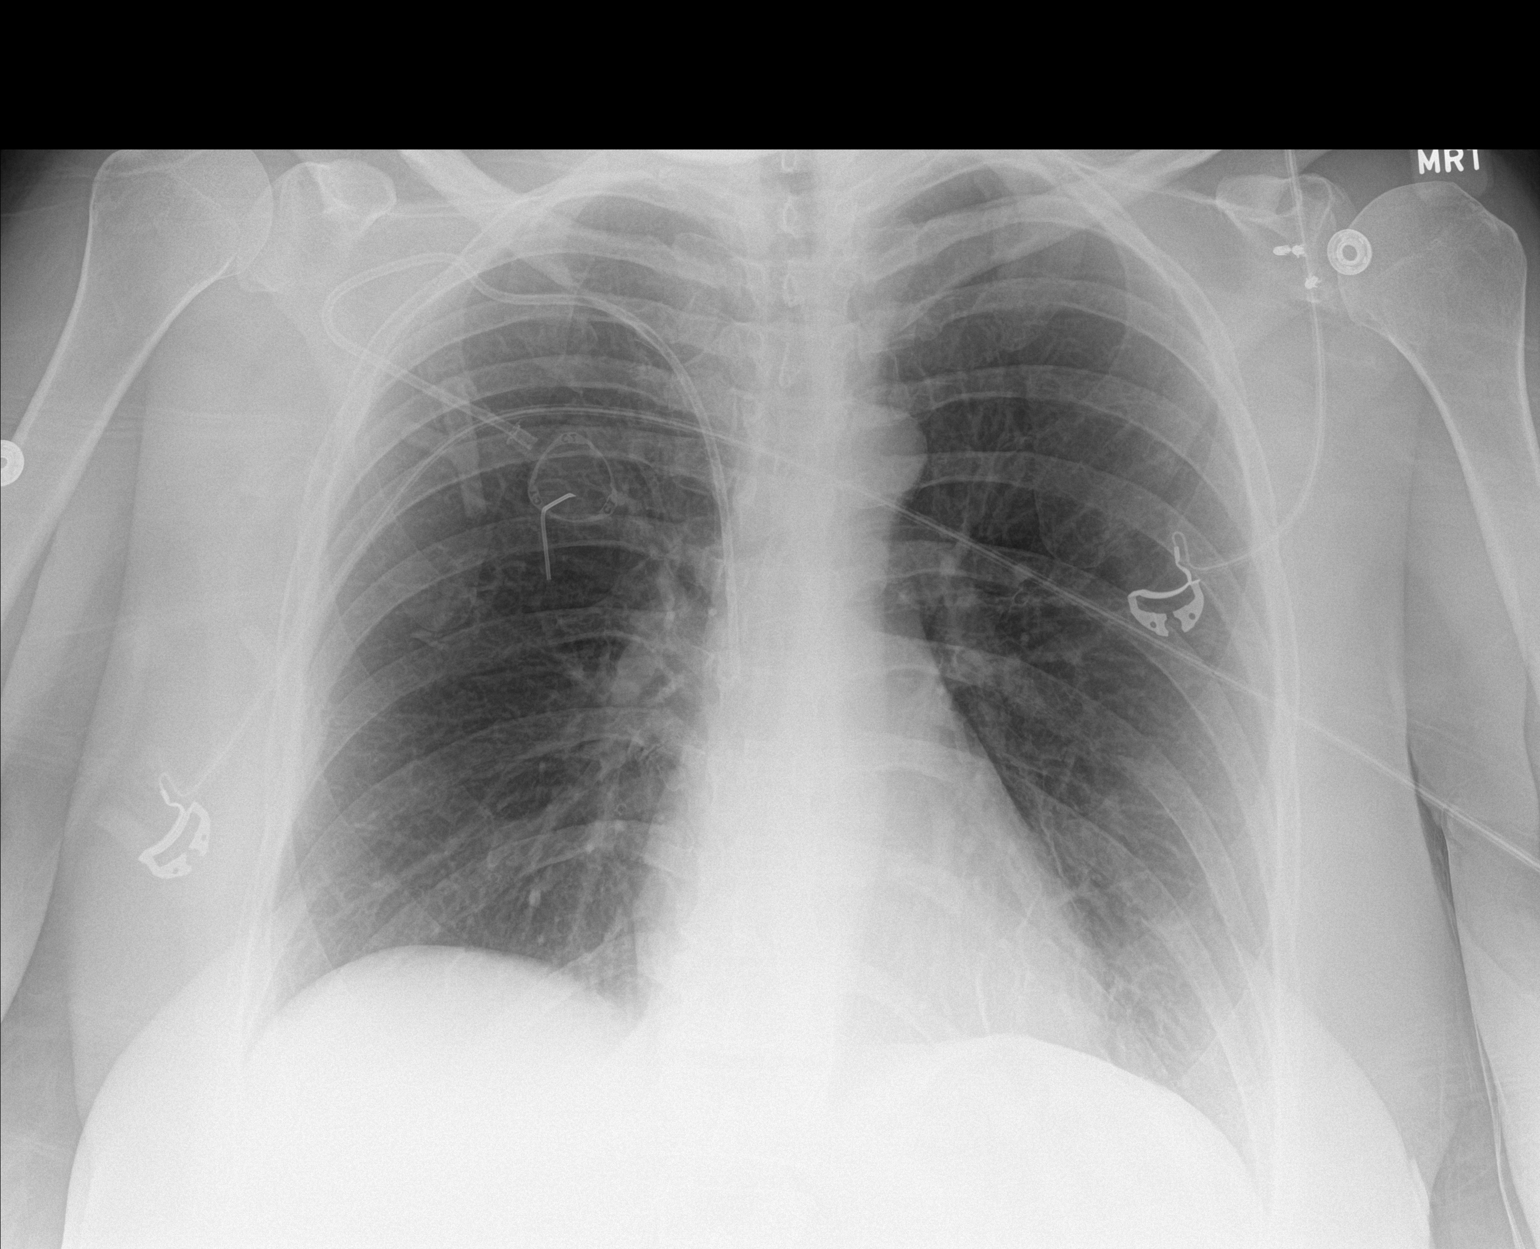

[1 of 1 positions shown; findings below may reference images not displayed]

FINDINGS: Power port has been inserted. The tip is in the superior vena cava
in good position 3 cm below the carina. No pneumothorax. Heart size
and vascularity are normal. Lungs are clear. No acute bone
abnormality. Prior labral surgery at the left shoulder.
IMPRESSION: Port-A-Cath in good position. No pneumothorax.

## 2020-06-02 IMAGING — MR MR BREAST BIOPSY
7 of 10 series · 30 of 48 positions shown · IV contrast (gadavist)
Comparison: 07/04/2018 and earlier

Addendum:
CLINICAL DATA: Patient presents for MR guided core biopsy of mass
in the LOWER INNER QUADRANT of the LEFT breast. Recently diagnosed
with RIGHT breast cancer.

EXAM:
MRI GUIDED CORE NEEDLE BIOPSY OF THE LEFT BREAST
TECHNIQUE: Multiplanar, multisequence MR imaging of the LEFT breast was
performed both before and after administration of intravenous
contrast.
CONTRAST:  10 ml Gadavist

[Series 2: fiducial unilateral · sagittal · 2.0mm · 1.33mm/px · 3 of 52 slices shown (1 of 2)]
[im 1/52]
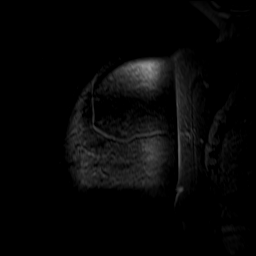
[im 26/52]
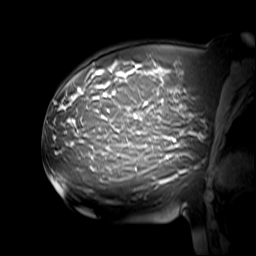
[im 52/52]
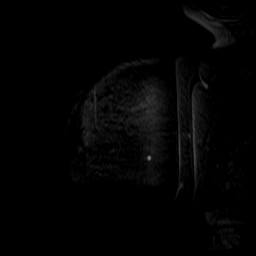

[Series 3: t2_tirm_tra ipat (a-p) · axial · 3.0mm · 0.70mm/px · z∈[-120,+57]mm · 4 of 60 slices shown (1 of 2)]
[im 1/60]
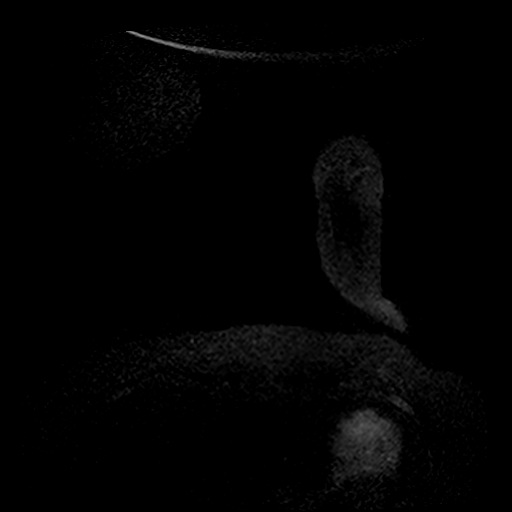
[im 20/60]
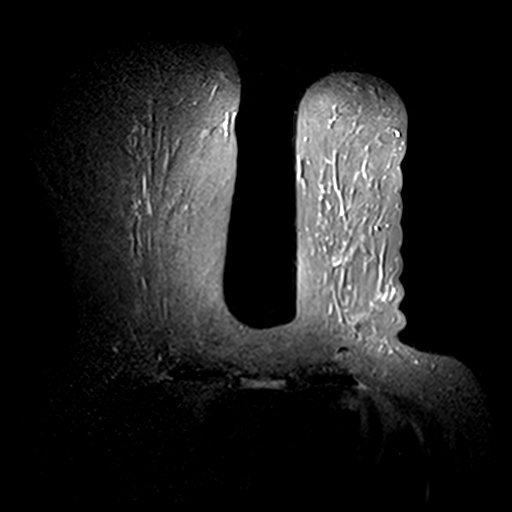
[im 40/60]
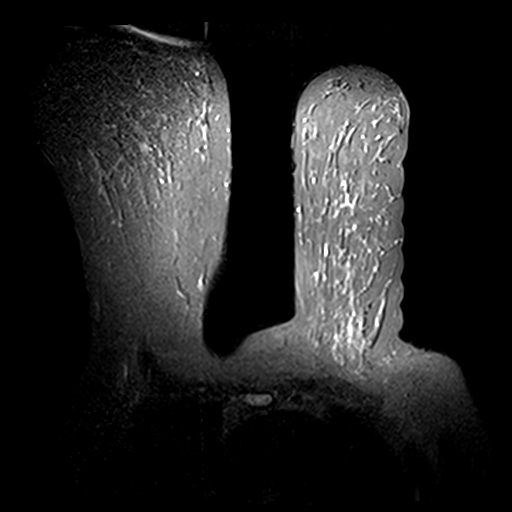
[im 60/60]
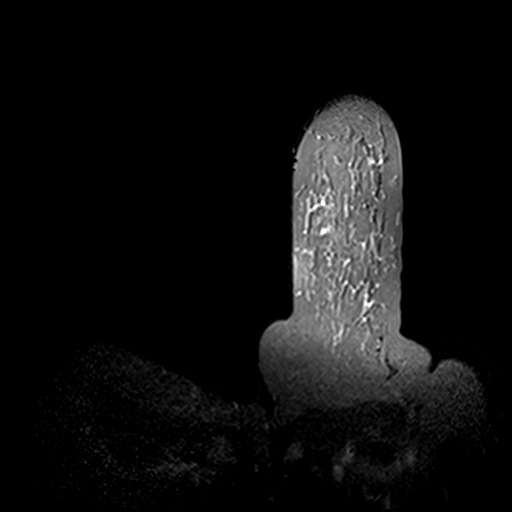

[Series 4: dynamic pre · axial · non-contrast · 1.3mm · 0.78mm/px · z∈[-116,+70]mm · 6 of 144 slices shown]
[im 1/144]
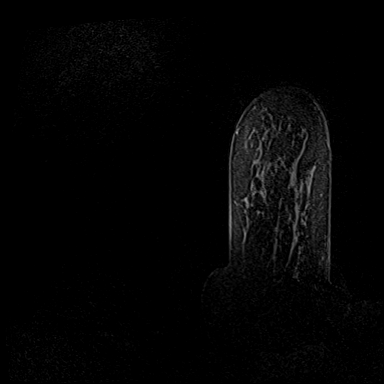
[im 29/144]
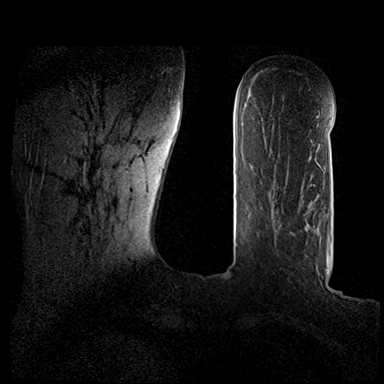
[im 58/144]
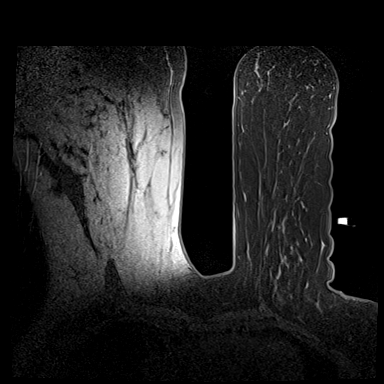
[im 86/144]
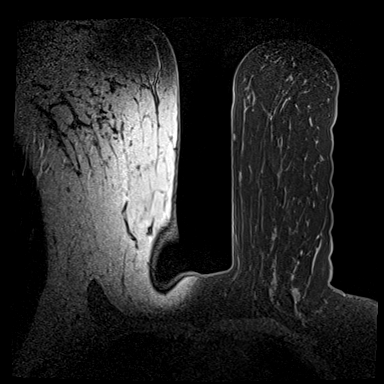
[im 115/144]
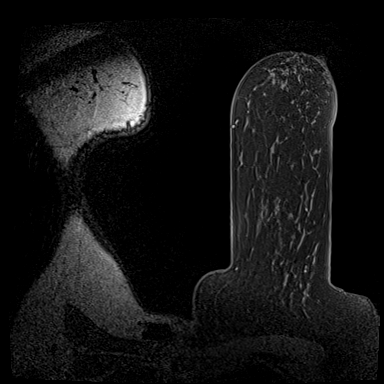
[im 144/144]
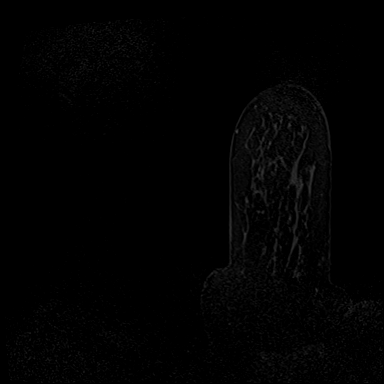

[Series 5: dynamic post 20 · axial · 1.3mm · 0.78mm/px · z∈[-116,+70]mm · 6 of 144 slices shown (1 of 2)]
[im 1/144]
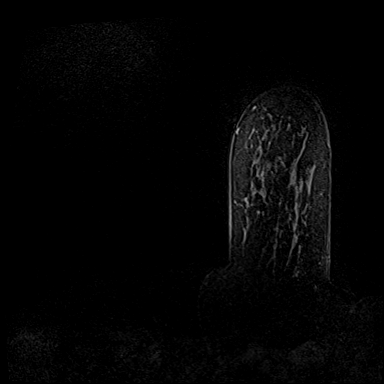
[im 29/144]
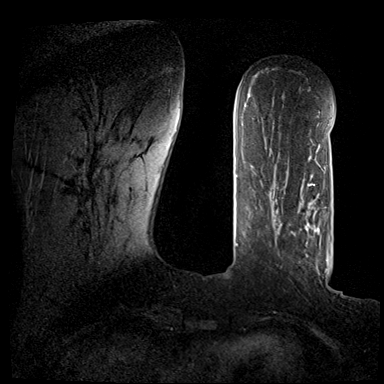
[im 58/144]
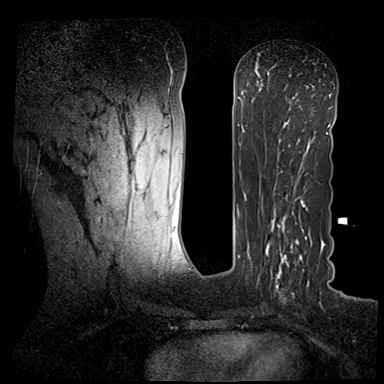
[im 86/144]
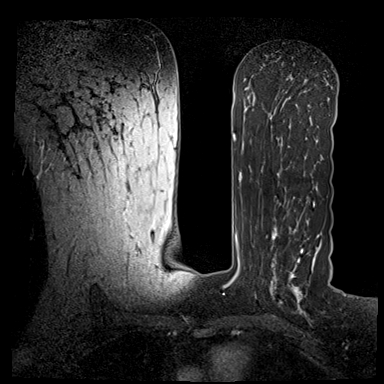
[im 115/144]
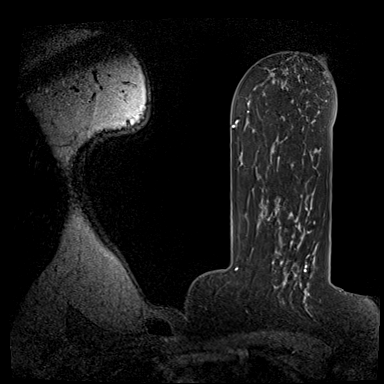
[im 144/144]
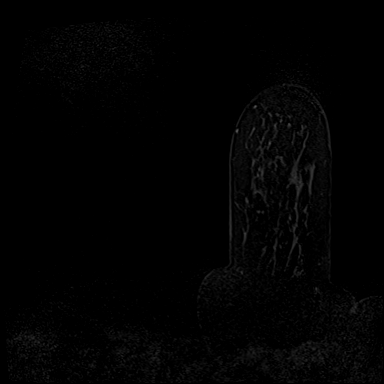

[Series 6: dynamic post 20 · axial · 1.3mm · 0.78mm/px · z∈[-116,+70]mm · 6 of 144 slices shown (2 of 2)]
[im 1/144]
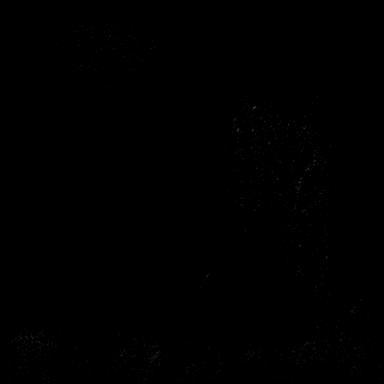
[im 29/144]
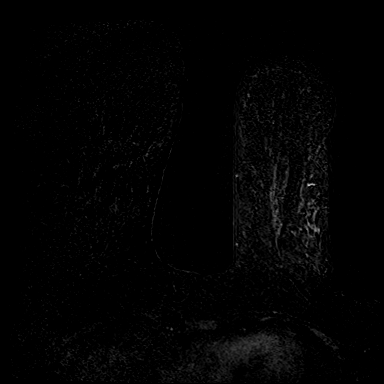
[im 58/144]
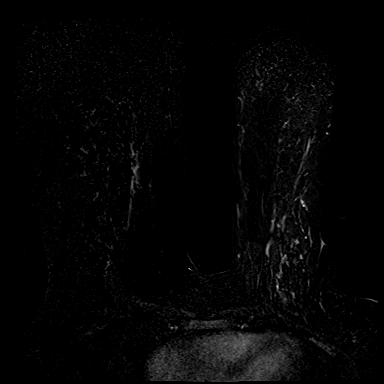
[im 86/144]
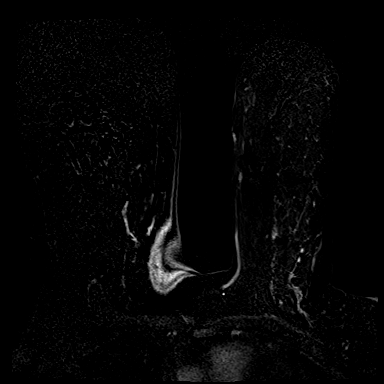
[im 115/144]
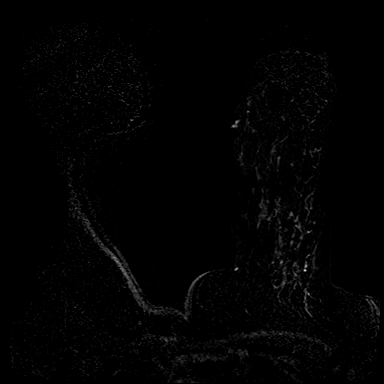
[im 144/144]
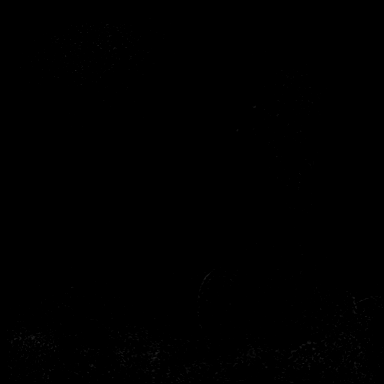

[Series 8: t2_tirm_tra ipat (a-p) · axial · 3.0mm · 0.70mm/px · z∈[-126,+51]mm · 3 of 60 slices shown (2 of 2)]
[im 1/60]
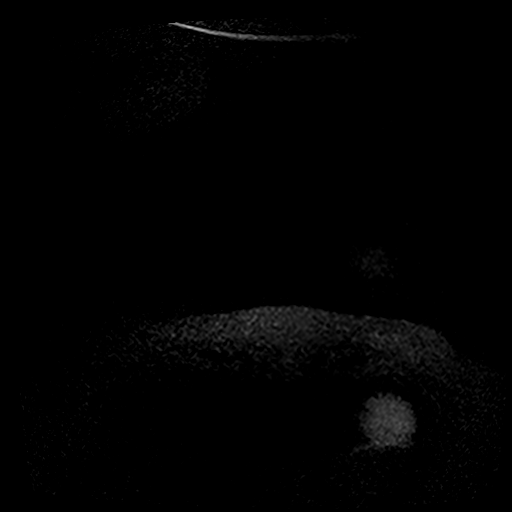
[im 30/60]
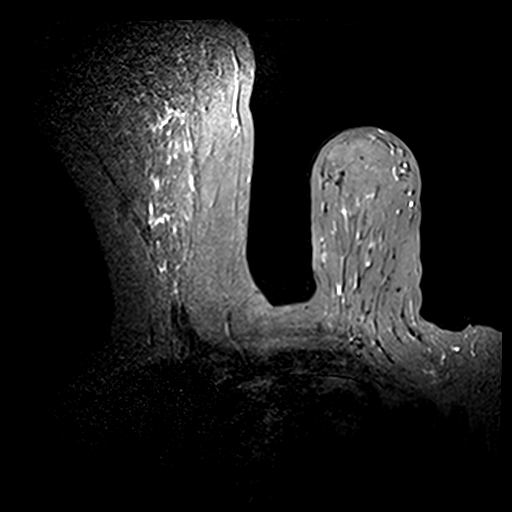
[im 60/60]
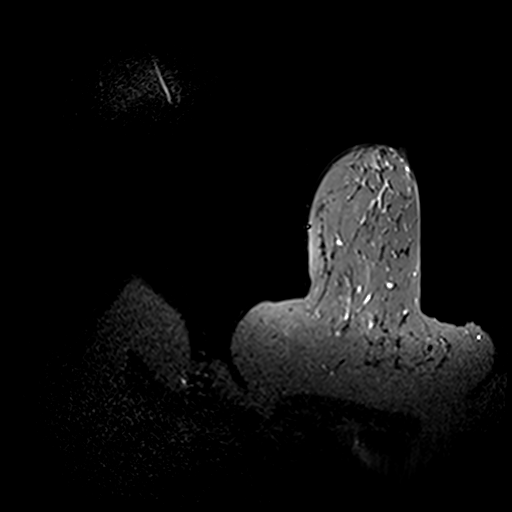

[Series 9: fiducial unilateral · sagittal · 2.0mm · 1.33mm/px · 2 of 52 slices shown (2 of 2)]
[im 1/52]
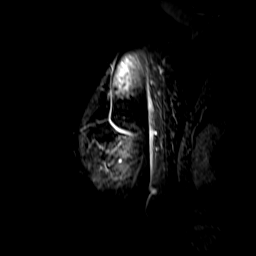
[im 52/52]
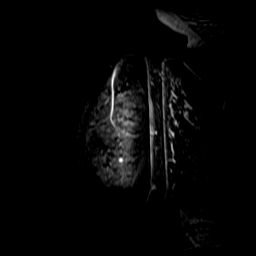

[30 of 48 positions shown; findings below may reference images not displayed]

FINDINGS: I met with the patient, and we discussed the procedure of MRI guided
biopsy, including risks, benefits, and alternatives. Specifically,
we discussed the risks of infection, bleeding, tissue injury, clip
migration, and inadequate sampling. Informed, written consent was
given. The usual time out protocol was performed immediately prior
to the procedure.

Using sterile technique, 1% Lidocaine, MRI guidance, and a 9 gauge
vacuum assisted device, biopsy was performed of enhancing mass in
the LOWER INNER QUADRANT of the LEFT breast using a LATERAL to
MEDIAL approach. The patient was not able to lie prone to allow a
MEDIAL approach, a secondary to pain at the site of a recently
placed Port-A-Cath. At the conclusion of the procedure, a barbell
shaped tissue marker clip was deployed into the biopsy cavity.

Follow-up 2-view mammogram was performed and dictated separately.
IMPRESSION: MRI guided biopsy of LEFT breast mass.  No apparent complications.

ADDENDUM:
Pathology revealed FIBROCYSTIC CHANGES of the LEFT breast, lower
inner quadrant, anterior, (barbell clip). This was found to be
concordant by Dr. Relindas Auad.

Pathology results were discussed with the patient by telephone. The
patient reported doing well after the biopsy with tenderness at the
site. Post biopsy instructions and care were reviewed and questions
were answered. The patient was encouraged to call The [REDACTED]

The patient has a recent diagnosis of RIGHT breast cancer and should
follow her outlined treatment plan.

Dr. Raulito Kyle was notified of biopsy results via [REDACTED] message
on July 14, 2018.

Pathology results reported by Ljudmila-Kinjeskinja Geric, RN on 07/16/2018.

*** End of Addendum ***

## 2020-09-05 ENCOUNTER — Telehealth: Payer: Self-pay

## 2020-09-05 NOTE — Telephone Encounter (Signed)
RN spoke with patient regarding concerns with new upper back pain and flank pain, right side.    Patient reports pain has been a constant for 2-3 months.  No injuries.  No relief with OTC analgesics, or heating pad.    Patient with history of right breast cancer; bilateral mastectomies.    Will review with MD.

## 2020-09-06 ENCOUNTER — Other Ambulatory Visit: Payer: Self-pay

## 2020-09-06 DIAGNOSIS — Z171 Estrogen receptor negative status [ER-]: Secondary | ICD-10-CM

## 2020-09-06 DIAGNOSIS — C50411 Malignant neoplasm of upper-outer quadrant of right female breast: Secondary | ICD-10-CM

## 2020-09-06 NOTE — Progress Notes (Signed)
MD recommendations to obtain CT CAPs and Bone Scan.    Orders placed, patient notified.   Awaiting authorization.

## 2020-09-08 ENCOUNTER — Telehealth: Payer: Self-pay | Admitting: Hematology and Oncology

## 2020-09-08 NOTE — Telephone Encounter (Signed)
Scheduled appt per 3/11 sch msg. Spoke to pt who is aware of appt date and time.

## 2020-09-11 ENCOUNTER — Other Ambulatory Visit (HOSPITAL_COMMUNITY): Payer: BC Managed Care – PPO

## 2020-09-18 ENCOUNTER — Other Ambulatory Visit (HOSPITAL_COMMUNITY): Payer: BC Managed Care – PPO

## 2020-09-22 ENCOUNTER — Ambulatory Visit (HOSPITAL_COMMUNITY)
Admission: RE | Admit: 2020-09-22 | Discharge: 2020-09-22 | Disposition: A | Discharge status: Home / Self Care | Payer: BC Managed Care – PPO | Source: Ambulatory Visit | Attending: Hematology and Oncology | Admitting: Hematology and Oncology

## 2020-09-22 ENCOUNTER — Other Ambulatory Visit: Payer: Self-pay

## 2020-09-22 ENCOUNTER — Encounter (HOSPITAL_COMMUNITY): Payer: Self-pay

## 2020-09-22 DIAGNOSIS — Z171 Estrogen receptor negative status [ER-]: Secondary | ICD-10-CM | POA: Insufficient documentation

## 2020-09-22 DIAGNOSIS — C50411 Malignant neoplasm of upper-outer quadrant of right female breast: Secondary | ICD-10-CM

## 2020-09-22 MED ORDER — IOHEXOL 9 MG/ML PO SOLN: 1000.0000 mL | ORAL | Status: AC

## 2020-09-22 MED ORDER — IOHEXOL 300 MG/ML  SOLN
100.0000 mL | Freq: Once | INTRAMUSCULAR | Status: AC | PRN
  Administered 2020-09-22: 100 mL via INTRAVENOUS

## 2020-09-22 MED ORDER — IOHEXOL 9 MG/ML PO SOLN
ORAL | Status: AC
  Administered 2020-09-22: 1000 mL
  Filled 2020-09-22: qty 1000

## 2020-09-22 MED ORDER — TECHNETIUM TC 99M MEDRONATE IV KIT
21.5000 | PACK | Freq: Once | INTRAVENOUS | Status: AC
  Administered 2020-09-22: 21.5 via INTRAVENOUS

## 2020-10-01 NOTE — Progress Notes (Incomplete)
Patient Care Team: [name], NP as PCP - General (Nurse Practitioner) Nicholas Lose, MD as Consulting Physician (Hematology and Oncology) Fanny Skates, MD as Consulting Physician (General Surgery)  DIAGNOSIS: No diagnosis found.  SUMMARY OF ONCOLOGIC HISTORY: Oncology History  Malignant neoplasm of upper-outer quadrant of right breast in female, estrogen receptor negative ([place])  06/22/2018 Initial Diagnosis   Screening mammogram detected right breast mass 1.1 cm at 10 o'clock position right breast 12 cm from the nipple, no abnormal lymph nodes, biopsy of the mass revealed IDC with DCIS grade 3, ER 0%, PR 0%, HER-2 -1+ by IHC, Ki-67 40%, T1c N0 stage Ib   06/29/2018 Cancer Staging   Staging form: Breast, AJCC 8th Edition - Clinical stage from 06/29/2018: Stage IB (cT1c, cN0, cM0, G3, ER-, PR-, HER2-) - Signed by Nicholas Lose, MD on 06/29/2018   07/09/2018 - 10/01/2018 Neo-Adjuvant Chemotherapy   Neo- Adjuvant chemotherapy with dose dense Adriamycin and Cytoxan x4 followed by Taxol and carboplatin weekly x5 (cycles 6-12 canceled due to extreme fatigue)   07/23/2018 Genetic Testing   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  A variant of uncertain significance (VUS) in a gene called APC was also noted. (c.7105C>T). Genes tested: AIP, ALK, APC*, ATM*, AXIN2, BAP1, BARD1, BLM, BMPR1A, BRCA1*, BRCA2*, BRIP1*, CDC73, CDH1*, CDK4, CDKN1B, CDKN2A, CHEK2*, CTNNA1, DICER1, FANCC, FH, FLCN, GALNT12, HOXB13, KIT, MAX, MEN1, MET, MLH1*, MRE11A, MSH2*, MSH6*, MUTYH*, NBN, NF1*, NF2, NTHL1, PALB2*, PDGFRA, PHOX2B, PMS2*, POLD1, POLE, POT1, PRKAR1A, PTCH1, PTEN*, RAD50, RAD51C*, RAD51D*, RB1, RET, SDHA, SDHAF2, SDHB, SDHC, SDHD, SMAD4, SMARCA4, SMARCB1, SMARCE1, STK11, SUFU, TMEM127, TP53*, TSC1, TSC2, VHL and XRCC2 (sequencing and deletion/duplication); CASR, CFTR, CPA1, CTRC, EGFR, MITF, PRSS1  and SPINK1 (sequencing only); EPCAM and GREM1 (deletion/duplication only). DNA and RNA analyses performed for * genes. UPDATE: APC [id number] VUS has been reclassified to Likely Benign.  The reclassification date is 03/13/2020.   11/06/2018 Surgery   Left mastectomy: Complete response, 0/3 lymph nodes negative   11/06/2018 Cancer Staging   Staging form: Breast, AJCC 8th Edition - Pathologic stage from 11/06/2018: No Stage Recommended (ypT0, pN0, cM0) - Signed by Gardenia Phlegm, NP on 01/25/2019     CHIEF COMPLIANT: Follow-up of triple negative right breast on surveillance   INTERVAL HISTORY: Holly Wiley is a 45 y.o. with above-mentioned history of triple negative right breast cancer who underwent neoadjuvant chemotherapy, bilateral mastectomies, and is currently on surveillance. CT CAP on 09/22/20 showed no evidence of metastatic disease. Bone scan on 09/22/20 showed no evidence of osseous metastatic disease. She presents to the clinic today for follow-up.   ALLERGIES:  is allergic to bee venom, dihydroergotamine, imitrex [sumatriptan], metoclopramide, and transderm-scop [scopolamine].  MEDICATIONS:  Current Outpatient Medications  Medication Sig Dispense Refill  . DULoxetine (CYMBALTA) 30 MG capsule Take 1 capsule (30 mg total) by mouth daily. Take 1 tab daily, then increase to 1 tab bid after 1 week 60 capsule 0  . Multiple Vitamin (MULTIVITAMIN WITH MINERALS) TABS tablet Take 1 tablet by mouth daily.    . ondansetron (ZOFRAN-ODT) 4 MG disintegrating tablet Take 1 tablet (4 mg total) by mouth every 6 (six) hours as needed for nausea. 20 tablet 1  . pregabalin (LYRICA) 50 MG capsule Take 1 capsule (50 mg total) by mouth 2 (two) times daily. 60 capsule 3   No current facility-administered medications for this visit.   Facility-Administered Medications Ordered in Other  Visits  Medication Dose Route Frequency Provider Last Rate Last Admin  . sodium chloride flush (NS) 0.9 % injection 10  mL  10 mL Intracatheter PRN Nicholas Lose, MD        PHYSICAL EXAMINATION: ECOG PERFORMANCE STATUS: {CHL ONC ECOG HU:[id number]}  There were no vitals filed for this visit. There were no vitals filed for this visit.  BREAST:*** No palpable masses or nodules in either right or left breasts. No palpable axillary supraclavicular or infraclavicular adenopathy no breast tenderness or nipple discharge. (exam performed in the presence of a chaperone)  LABORATORY DATA:  I have reviewed the data as listed CMP Latest Ref Rng & Units 12/01/2018 11/29/2018 11/28/2018  Glucose 70 - 99 mg/dL 104(H) 110(H) 91  BUN 6 - 20 mg/dL <5(L) 6 <5(L)  Creatinine 0.44 - 1.00 mg/dL 0.75 0.62 0.66  Sodium 135 - 145 mmol/L 138 138 138  Potassium 3.5 - 5.1 mmol/L 4.0 3.1(L) 4.0  Chloride 98 - 111 mmol/L 100 103 100  CO2 22 - 32 mmol/L '27 24 26  ' Calcium 8.9 - 10.3 mg/dL 8.8(L) 8.3(L) 9.2  Total Protein 6.5 - 8.1 g/dL - - -  Total Bilirubin 0.3 - 1.2 mg/dL - - -  Alkaline Phos 38 - 126 U/L - - -  AST 15 - 41 U/L - - -  ALT 0 - 44 U/L - - -    Lab Results  Component Value Date   WBC 6.1 11/29/2018   HGB 8.9 (L) 11/29/2018   HCT 27.8 (L) 11/29/2018   MCV 103.0 (H) 11/29/2018   PLT 164 11/29/2018   NEUTROABS 7.0 11/26/2018    ASSESSMENT & PLAN:  No problem-specific Assessment & Plan notes found for this encounter.    No orders of the defined types were placed in this encounter.  The patient has a good understanding of the overall plan. she agrees with it. she will call with any problems that may develop before the next visit here.  Total time spent: *** mins including face to face time and time spent for planning, charting and coordination of care  Rulon Eisenmenger, MD, MPH 10/01/2020  I, Molly Dorshimer, am acting as scribe for Dr. Nicholas Lose.  {insert scribe attestation}

## 2020-10-01 NOTE — Assessment & Plan Note (Deleted)
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%, T1c N0 stage Ib  Recommendation: 1.Neo-Adjuvant chemotherapy with dose dense Adriamycin and Cytoxan x4 followed by Taxol and carboplatinweekly x12 2.Right mastectomy: 11/06/2018: Pathologic complete response Followed by surveillance --------------------------------------------------------------------------------------------------------------------------------- Severe intractable Axillary itching: She tried everything. Lyrica Severe stress: Her past father passed away   Breast cancer surveillance: Breast exam: Bilateral mastectomies with scar from one end of the chest to the other  Return to clinic in 1 year for follow-up

## 2020-10-02 ENCOUNTER — Inpatient Hospital Stay: Payer: BC Managed Care – PPO | Attending: Hematology and Oncology | Admitting: Hematology and Oncology

## 2020-10-02 DIAGNOSIS — C50411 Malignant neoplasm of upper-outer quadrant of right female breast: Secondary | ICD-10-CM

## 2020-11-10 DIAGNOSIS — M722 Plantar fascial fibromatosis: Secondary | ICD-10-CM

## 2020-11-10 DIAGNOSIS — M6701 Short Achilles tendon (acquired), right ankle: Secondary | ICD-10-CM | POA: Insufficient documentation

## 2020-11-10 HISTORY — DX: Short Achilles tendon (acquired), right ankle: M67.01

## 2020-11-10 HISTORY — DX: Plantar fascial fibromatosis: M72.2

## 2021-01-08 ENCOUNTER — Encounter: Payer: Self-pay | Admitting: Hematology and Oncology

## 2021-01-14 ENCOUNTER — Encounter: Payer: Self-pay | Admitting: Hematology and Oncology

## 2021-01-14 NOTE — Progress Notes (Signed)
Patient Care Team: [name], NP as PCP - General (Nurse Practitioner) Nicholas Lose, MD as Consulting Physician (Hematology and Oncology) Fanny Skates, MD as Consulting Physician (General Surgery)  DIAGNOSIS:    ICD-10-CM   1. Malignant neoplasm of upper-outer quadrant of right breast in female, estrogen receptor negative ([place])  C50.411    Z17.1       SUMMARY OF ONCOLOGIC HISTORY: Oncology History  Malignant neoplasm of upper-outer quadrant of right breast in female, estrogen receptor negative ([place])  06/22/2018 Initial Diagnosis   Screening mammogram detected right breast mass 1.1 cm at 10 o'clock position right breast 12 cm from the nipple, no abnormal lymph nodes, biopsy of the mass revealed IDC with DCIS grade 3, ER 0%, PR 0%, HER-2 -1+ by IHC, Ki-67 40%, T1c N0 stage Ib    06/29/2018 Cancer Staging   Staging form: Breast, AJCC 8th Edition - Clinical stage from 06/29/2018: Stage IB (cT1c, cN0, cM0, G3, ER-, PR-, HER2-) - Signed by Nicholas Lose, MD on 06/29/2018    07/09/2018 - 10/01/2018 Neo-Adjuvant Chemotherapy   Neo- Adjuvant chemotherapy with dose dense Adriamycin and Cytoxan x4 followed by Taxol and carboplatin weekly x5 (cycles 6-12 canceled due to extreme fatigue)    07/23/2018 Genetic Testing   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  A variant of uncertain significance (VUS) in a gene called APC was also noted. (c.7105C>T). Genes tested: AIP, ALK, APC*, ATM*, AXIN2, BAP1, BARD1, BLM, BMPR1A, BRCA1*, BRCA2*, BRIP1*, CDC73, CDH1*, CDK4, CDKN1B, CDKN2A, CHEK2*, CTNNA1, DICER1, FANCC, FH, FLCN, GALNT12, HOXB13, KIT, MAX, MEN1, MET, MLH1*, MRE11A, MSH2*, MSH6*, MUTYH*, NBN, NF1*, NF2, NTHL1, PALB2*, PDGFRA, PHOX2B, PMS2*, POLD1, POLE, POT1, PRKAR1A, PTCH1, PTEN*, RAD50, RAD51C*, RAD51D*, RB1, RET, SDHA, SDHAF2, SDHB, SDHC, SDHD, SMAD4, SMARCA4, SMARCB1, SMARCE1,  STK11, SUFU, TMEM127, TP53*, TSC1, TSC2, VHL and XRCC2 (sequencing and deletion/duplication); CASR, CFTR, CPA1, CTRC, EGFR, MITF, PRSS1 and SPINK1 (sequencing only); EPCAM and GREM1 (deletion/duplication only). DNA and RNA analyses performed for * genes. UPDATE: APC [id number] VUS has been reclassified to Likely Benign.  The reclassification date is 03/13/2020.   11/06/2018 Surgery   Left mastectomy: Complete response, 0/3 lymph nodes negative    11/06/2018 Cancer Staging   Staging form: Breast, AJCC 8th Edition - Pathologic stage from 11/06/2018: No Stage Recommended (ypT0, pN0, cM0) - Signed by Gardenia Phlegm, NP on 01/25/2019      CHIEF COMPLIANT:  Follow-up of triple negative right breast on surveillance   INTERVAL HISTORY: Holly Wiley is a 45 y.o. with above-mentioned history of  triple negative right breast cancer who underwent neoadjuvant chemotherapy, bilateral mastectomies, and is currently on surveillance. She reports to the clinic today for follow-up.  She suffers from intractable itching of the chest wall that has not responded to any treatment so far.  She has taken gabapentin, Cymbalta, Lyrica, Benadryl, steroids, essential oils, acupuncture, topical lidocaine etc. and nothing appears to help it.  She is continuously scratching her chest wall for the past year or so.  This is especially worse at bedtime.  ALLERGIES:  is allergic to bee venom, dihydroergotamine, imitrex [sumatriptan], metoclopramide, and transderm-scop [scopolamine].  MEDICATIONS:  Current Outpatient Medications  Medication Sig Dispense Refill   DULoxetine (CYMBALTA) 30 MG capsule Take 1 capsule (30 mg total) by mouth daily. Take 1 tab daily, then increase to 1 tab bid after 1 week 60 capsule 0   Multiple Vitamin (MULTIVITAMIN WITH MINERALS)  TABS tablet Take 1 tablet by mouth daily.     ondansetron (ZOFRAN-ODT) 4 MG disintegrating tablet Take 1 tablet (4 mg total) by mouth every 6 (six) hours as needed for  nausea. 20 tablet 1   pregabalin (LYRICA) 50 MG capsule Take 1 capsule (50 mg total) by mouth 2 (two) times daily. 60 capsule 3   No current facility-administered medications for this visit.   Facility-Administered Medications Ordered in Other Visits  Medication Dose Route Frequency Provider Last Rate Last Admin   sodium chloride flush (NS) 0.9 % injection 10 mL  10 mL Intracatheter PRN Nicholas Lose, MD        PHYSICAL EXAMINATION: ECOG PERFORMANCE STATUS: 1 - Symptomatic but completely ambulatory  Vitals:   01/15/21 0929  BP: 116/75  Pulse: 97  Temp: 97.6 F (36.4 C)  SpO2: 97%   Filed Weights   01/15/21 0929  Weight: 181 lb 1.6 oz (82.1 kg)    BREAST: Bilateral chest wall scars are without any palpable lumps or nodules.  LABORATORY DATA:  I have reviewed the data as listed CMP Latest Ref Rng & Units 12/01/2018 11/29/2018 11/28/2018  Glucose 70 - 99 mg/dL 104(H) 110(H) 91  BUN 6 - 20 mg/dL <5(L) 6 <5(L)  Creatinine 0.44 - 1.00 mg/dL 0.75 0.62 0.66  Sodium 135 - 145 mmol/L 138 138 138  Potassium 3.5 - 5.1 mmol/L 4.0 3.1(L) 4.0  Chloride 98 - 111 mmol/L 100 103 100  CO2 22 - 32 mmol/L _0 Calcium 8.9 - 10.3 mg/dL 8.8(L) 8.3(L) 9.2  Total Protein 6.5 - 8.1 g/dL - - -  Total Bilirubin 0.3 - 1.2 mg/dL - - -  Alkaline Phos 38 - 126 U/L - - -  AST 15 - 41 U/L - - -  ALT 0 - 44 U/L - - -    Lab Results  Component Value Date   WBC 6.1 11/29/2018   HGB 8.9 (L) 11/29/2018   HCT 27.8 (L) 11/29/2018   MCV 103.0 (H) 11/29/2018   PLT 164 11/29/2018   NEUTROABS 7.0 11/26/2018    ASSESSMENT & PLAN:  Malignant neoplasm of upper-outer quadrant of right breast in female, estrogen receptor negative ([place]) 06/22/2018: Screening mammogram detected right breast mass 1.1 cm at 10 o'clock position right breast 12 cm from the nipple, no abnormal lymph nodes, biopsy of the mass revealed IDC with DCIS grade 3, ER 0%, PR 0%, HER-2 -1+ by IHC, Ki-67 40%, T1c N0 stage Ib    Recommendation: 1.  Neo- Adjuvant chemotherapy with dose dense Adriamycin and Cytoxan x4 followed by Taxol and carboplatin weekly x12 completed 10/01/2018 2.  Right mastectomy: 11/06/2018: Pathologic complete response Followed by surveillance --------------------------------------------------------------------------------------------------------------------------------- Severe intractable Axillary itching: She has tried everything to stop the itching.  Nothing has worked including gabapentin, Lyrica, Cymbalta, essential oils, topical lidocaine, Benadryl, steroids.  Acupuncture did not help either. I will refer her to interventional pain management to see if they can do anything to cut down the itching.  This is especially worse at night.  Stress: Helps her mom.  Breast cancer surveillance: 1.  Breast exam: Bilateral mastectomies with scar from one end of the chest to the other 2. ultrasound left breast mastectomy scar: 1.2 cm hypoechoic scar: Fat necrosis 3. Bone Scan and CT CAP 09/22/20: Benign     Return to clinic in 1 year for follow-up      No orders of the defined types were placed in this encounter.  The patient  has a good understanding of the overall plan. she agrees with it. she will call with any problems that may develop before the next visit here.  Total time spent: 20 mins including face to face time and time spent for planning, charting and coordination of care  Rulon Eisenmenger, MD, MPH 01/15/2021  I, Thana Ates, am acting as scribe for Dr. Nicholas Lose.  I have reviewed the above documentation for accuracy and completeness, and I agree with the above.

## 2021-01-15 ENCOUNTER — Inpatient Hospital Stay: Payer: BC Managed Care – PPO | Attending: Hematology and Oncology | Admitting: Hematology and Oncology

## 2021-01-15 ENCOUNTER — Other Ambulatory Visit: Payer: Self-pay

## 2021-01-15 DIAGNOSIS — Z9013 Acquired absence of bilateral breasts and nipples: Secondary | ICD-10-CM | POA: Diagnosis not present

## 2021-01-15 DIAGNOSIS — Z171 Estrogen receptor negative status [ER-]: Secondary | ICD-10-CM | POA: Diagnosis not present

## 2021-01-15 DIAGNOSIS — C50411 Malignant neoplasm of upper-outer quadrant of right female breast: Secondary | ICD-10-CM | POA: Diagnosis not present

## 2021-01-15 MED ORDER — ASPIRIN EC 81 MG PO TBEC: 81.0000 mg | DELAYED_RELEASE_TABLET | Freq: Every day | ORAL | 11 refills | Status: DC

## 2021-01-15 MED ORDER — ASPIRIN EC 325 MG PO TBEC: 325.0000 mg | DELAYED_RELEASE_TABLET | Freq: Every day | ORAL | 0 refills | Status: DC

## 2021-01-15 NOTE — Assessment & Plan Note (Signed)
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%, T1c N0 stage Ib  Recommendation: 1.Neo-Adjuvant chemotherapy with dose dense Adriamycin and Cytoxan x4 followed by Taxol and carboplatinweekly x12 completed 10/01/2018 2.Right mastectomy: 11/06/2018: Pathologic complete response Followed by surveillance --------------------------------------------------------------------------------------------------------------------------------- Severe intractable Axillary itching: She has tried everything to stop the itching. Stress: Helps her mom.  Breast cancer surveillance: 1.  Breast exam: Bilateral mastectomies with scar from one end of the chest to the other 2. ultrasound left breast mastectomy scar: 1.2 cm hypoechoic scar: Fat necrosis 3. Bone Scan and CT CAP 09/22/20: Benign   Return to clinic in 1 year for follow-up

## 2021-01-18 ENCOUNTER — Ambulatory Visit: Payer: BC Managed Care – PPO | Admitting: Hematology and Oncology

## 2021-01-19 ENCOUNTER — Ambulatory Visit: Payer: BC Managed Care – PPO | Admitting: Hematology and Oncology

## 2021-02-12 ENCOUNTER — Encounter: Payer: Self-pay | Admitting: Hematology and Oncology

## 2021-02-12 ENCOUNTER — Telehealth: Payer: Self-pay | Admitting: *Deleted

## 2021-02-12 DIAGNOSIS — C50411 Malignant neoplasm of upper-outer quadrant of right female breast: Secondary | ICD-10-CM

## 2021-02-12 NOTE — Telephone Encounter (Signed)
Notified that CT was ordered. Requested PA.

## 2021-02-13 ENCOUNTER — Telehealth: Payer: Self-pay | Admitting: Hematology and Oncology

## 2021-02-13 NOTE — Telephone Encounter (Signed)
Scheduled appt per 8/16 sch msg. Pt aware.

## 2021-02-20 ENCOUNTER — Ambulatory Visit (HOSPITAL_COMMUNITY): Payer: BC Managed Care – PPO

## 2021-02-21 ENCOUNTER — Telehealth: Payer: BC Managed Care – PPO | Admitting: Hematology and Oncology

## 2021-05-18 ENCOUNTER — Other Ambulatory Visit: Payer: Self-pay | Admitting: Nurse Practitioner

## 2021-05-26 DIAGNOSIS — M542 Cervicalgia: Secondary | ICD-10-CM

## 2021-05-26 HISTORY — DX: Cervicalgia: M54.2

## 2021-06-12 DIAGNOSIS — M47812 Spondylosis without myelopathy or radiculopathy, cervical region: Secondary | ICD-10-CM

## 2021-06-12 HISTORY — DX: Spondylosis without myelopathy or radiculopathy, cervical region: M47.812

## 2021-07-09 ENCOUNTER — Other Ambulatory Visit: Payer: Self-pay | Admitting: *Deleted

## 2021-07-09 ENCOUNTER — Encounter: Payer: Self-pay | Admitting: Hematology and Oncology

## 2021-07-09 DIAGNOSIS — Z171 Estrogen receptor negative status [ER-]: Secondary | ICD-10-CM

## 2021-07-17 ENCOUNTER — Other Ambulatory Visit: Payer: Self-pay | Admitting: *Deleted

## 2021-07-17 ENCOUNTER — Encounter: Payer: Self-pay | Admitting: *Deleted

## 2021-07-17 DIAGNOSIS — Z171 Estrogen receptor negative status [ER-]: Secondary | ICD-10-CM

## 2021-07-17 DIAGNOSIS — C50411 Malignant neoplasm of upper-outer quadrant of right female breast: Secondary | ICD-10-CM

## 2021-07-17 NOTE — Progress Notes (Signed)
Per request of PA team, RN sucessfully faxed recent orthopedic notes and MRI cervical spine to [employer] [phone]) case #[id number] to help with approval of CT CAP.

## 2021-07-20 ENCOUNTER — Ambulatory Visit (HOSPITAL_BASED_OUTPATIENT_CLINIC_OR_DEPARTMENT_OTHER): Payer: BC Managed Care – PPO

## 2021-07-20 ENCOUNTER — Encounter (HOSPITAL_BASED_OUTPATIENT_CLINIC_OR_DEPARTMENT_OTHER): Payer: Self-pay

## 2021-07-20 ENCOUNTER — Other Ambulatory Visit: Payer: Self-pay

## 2021-07-20 ENCOUNTER — Ambulatory Visit (HOSPITAL_BASED_OUTPATIENT_CLINIC_OR_DEPARTMENT_OTHER)
Admission: RE | Admit: 2021-07-20 | Discharge: 2021-07-20 | Disposition: A | Discharge status: Home / Self Care | Payer: Managed Care, Other (non HMO) | Source: Ambulatory Visit | Attending: Hematology and Oncology | Admitting: Hematology and Oncology

## 2021-07-20 DIAGNOSIS — Z171 Estrogen receptor negative status [ER-]: Secondary | ICD-10-CM | POA: Diagnosis present

## 2021-07-20 DIAGNOSIS — C50411 Malignant neoplasm of upper-outer quadrant of right female breast: Secondary | ICD-10-CM | POA: Insufficient documentation

## 2021-07-20 MED ORDER — IOHEXOL 300 MG/ML  SOLN
100.0000 mL | Freq: Once | INTRAMUSCULAR | Status: AC | PRN
  Administered 2021-07-20: 100 mL via INTRAVENOUS

## 2021-07-23 ENCOUNTER — Other Ambulatory Visit: Payer: Self-pay | Admitting: *Deleted

## 2021-07-23 ENCOUNTER — Encounter: Payer: Self-pay | Admitting: *Deleted

## 2021-07-23 ENCOUNTER — Encounter: Payer: Self-pay | Admitting: Hematology and Oncology

## 2021-07-23 DIAGNOSIS — C50411 Malignant neoplasm of upper-outer quadrant of right female breast: Secondary | ICD-10-CM

## 2021-07-23 DIAGNOSIS — Z98811 Dental restoration status: Secondary | ICD-10-CM | POA: Insufficient documentation

## 2021-07-23 DIAGNOSIS — I3139 Other pericardial effusion (noninflammatory): Secondary | ICD-10-CM

## 2021-07-23 DIAGNOSIS — K561 Intussusception: Secondary | ICD-10-CM

## 2021-07-23 DIAGNOSIS — G43909 Migraine, unspecified, not intractable, without status migrainosus: Secondary | ICD-10-CM | POA: Insufficient documentation

## 2021-07-23 DIAGNOSIS — I499 Cardiac arrhythmia, unspecified: Secondary | ICD-10-CM

## 2021-07-23 DIAGNOSIS — Z171 Estrogen receptor negative status [ER-]: Secondary | ICD-10-CM

## 2021-07-23 HISTORY — DX: Cardiac arrhythmia, unspecified: I49.9

## 2021-07-23 NOTE — Progress Notes (Signed)
Per MD request RN successfully faxed [phone]) referral to Dr. 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

## 2021-07-23 NOTE — Progress Notes (Signed)
Received message from pt stating Dr. Bettina Gavia with GI office does not accept her insurance.  Per Md request RN successfully faxed referral to [facility].

## 2021-07-23 NOTE — Progress Notes (Signed)
Per MD request RN successfully faxed referral to Dr. Shirlee More [phone]) for further evaluation of small pericardial effusion.

## 2021-07-24 ENCOUNTER — Encounter: Payer: Self-pay | Admitting: Gastroenterology

## 2021-08-10 ENCOUNTER — Ambulatory Visit (INDEPENDENT_AMBULATORY_CARE_PROVIDER_SITE_OTHER): Payer: Managed Care, Other (non HMO) | Admitting: Gastroenterology

## 2021-08-10 ENCOUNTER — Other Ambulatory Visit (INDEPENDENT_AMBULATORY_CARE_PROVIDER_SITE_OTHER): Payer: Managed Care, Other (non HMO)

## 2021-08-10 ENCOUNTER — Encounter: Payer: Self-pay | Admitting: Gastroenterology

## 2021-08-10 VITALS — BP 102/70 | HR 95 | Ht 67.0 in | Wt 149.5 lb

## 2021-08-10 DIAGNOSIS — K921 Melena: Secondary | ICD-10-CM

## 2021-08-10 DIAGNOSIS — R1013 Epigastric pain: Secondary | ICD-10-CM | POA: Diagnosis not present

## 2021-08-10 DIAGNOSIS — R112 Nausea with vomiting, unspecified: Secondary | ICD-10-CM | POA: Diagnosis not present

## 2021-08-10 DIAGNOSIS — Z86010 Personal history of colonic polyps: Secondary | ICD-10-CM | POA: Diagnosis not present

## 2021-08-10 DIAGNOSIS — K561 Intussusception: Secondary | ICD-10-CM | POA: Diagnosis not present

## 2021-08-10 LAB — CBC
HCT: 41.6 % (ref 36.0–46.0)
Hemoglobin: 14 g/dL (ref 12.0–15.0)
MCHC: 33.6 g/dL (ref 30.0–36.0)
MCV: 94.3 fl (ref 78.0–100.0)
Platelets: 189 10*3/uL (ref 150.0–400.0)
RBC: 4.41 Mil/uL (ref 3.87–5.11)
RDW: 13.9 % (ref 11.5–15.5)
WBC: 4.1 10*3/uL (ref 4.0–10.5)

## 2021-08-10 MED ORDER — CLENPIQ 10-3.5-12 MG-GM -GM/160ML PO SOLN: 1.0000 | ORAL | 0 refills | Status: DC

## 2021-08-10 NOTE — Patient Instructions (Addendum)
If you are age 46 or older, your body mass index should be between 23-30. Your Body mass index is 23.42 kg/m. If this is out of the aforementioned range listed, please consider follow up with your Primary Care Provider.  If you are age 35 or younger, your body mass index should be between 19-25. Your Body mass index is 23.42 kg/m. If this is out of the aformentioned range listed, please consider follow up with your Primary Care Provider.   __________________________________________________________  The [facility] providers would like to encourage you to use [facility] to communicate with providers for non-urgent requests or questions.  Due to long hold times on the telephone, sending your provider a message by [facility] may be a faster and more efficient way to get a response.  Please allow 48 business hours for a response.  Please remember that this is for non-urgent requests.  _________________________________________________________  Please go to the lab on the 2nd floor [address] before you leave the office today.   ________________________________________________________  Dennis Bast will receive a call from [facility] scheduling to schedule your CT enterography. If you do not hear from them within 2 business days please contact them at [phone].  You have been scheduled for a CT scan of the abdomen and pelvis at [facility] ([address]---this is in the same building as Press [facility]).   You are scheduled on _______ at _______. You should arrive 15 minutes prior to your appointment time for registration. Please follow the written instructions below on the day of your exam:  WARNING: IF YOU ARE ALLERGIC TO IODINE/X-RAY DYE, PLEASE NOTIFY RADIOLOGY IMMEDIATELY AT [phone]! YOU WILL BE GIVEN A 13 HOUR PREMEDICATION PREP.  1) Do not eat or drink anything after ________ (4 hours prior to your test) 2) You have been given 2 bottles of oral contrast to drink. The  solution may taste better if refrigerated, but do NOT add ice or any other liquid to this solution. Shake well before drinking.    Drink 1 bottle of contrast @ ________ (2 hours prior to your exam)  Drink 1 bottle of contrast @ _________ (1 hour prior to your exam)  You may take any medications as prescribed with a small amount of water, if necessary. If you take any of the following medications: METFORMIN, GLUCOPHAGE, GLUCOVANCE, AVANDAMET, RIOMET, FORTAMET, [place] MET, JANUMET, GLUMETZA or METAGLIP, you MAY be asked to HOLD this medication 48 hours AFTER the exam.  The purpose of you drinking the oral contrast is to aid in the visualization of your intestinal tract. The contrast solution may cause some diarrhea. Depending on your individual set of symptoms, you may also receive an intravenous injection of x-ray contrast/dye. Plan on being at [facility] for 30 minutes or longer, depending on the type of exam you are having performed.  This test typically takes 30-45 minutes to complete.  If you have any questions regarding your exam or if you need to reschedule, you may call the CT department at [phone] between the hours of 8:00 am and 5:00 pm, Monday-Friday. ________________________________________________________  We have sent the following medications to your pharmacy for you to pick up at your convenience:  Clenpiq for your Colonoscopy.  Due to recent changes in healthcare laws, you may see the results of your imaging and laboratory studies on MyChart before your provider has had a chance to review them.  We understand that in some cases there may be results that are confusing  or concerning to you. Not all laboratory results come back in the same time frame and the provider may be waiting for multiple results in order to interpret others.  Please give [place] 48 hours in order for your provider to thoroughly review all the results before contacting the office for clarification of  your results.   Thank you for choosing me and [facility].  Gerrit Heck, D.O.    ________________________________________________________

## 2021-08-10 NOTE — Progress Notes (Signed)
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  - 07/20/2021: CT C/A/P performed for surveillance and evaluation of scapular/upper left back pain: Incidental jejunal intussusception, otherwise normal GI tract.  No areas of inflammation, obstruction, mass lesion.  Normal liver, pancreas, spleen.  No abdominal lymphadenopathy. Also with pericardial effusion- has appt with Dr. Bettina Gavia.   Reviewed labs from 02/12/2021: - Normal CBC, CMP (K3.4)  Has had intermittent BRBPR x4 years or so.Described as filling the toilet water. No associated constipation, diarrhea. Last episode was ~3 weeks ago.   Has nausea/vomiting related to migraines since grade school. No reflux sxs, dysphagia.   Does have upper abdominal pain. Intermittent, typically worse with supine. Not related to PO intake.   Fhx n/f father with advancd polyps, PGF, paternal uncle, MGF, maternal uncle with colon polyps. MU with CRC.   Started with routine colonoscopy with Dr. Melina Copa at age 45 or so with polyps in the past. Last was several years ago per patient. Will try to request records today.   1 EGD in the past in [place] and normal per patient. No record available for review.   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   Migraines    PONV (postoperative nausea and vomiting)      Past Surgical History:  Procedure Laterality  Date   BILATERAL TOTAL MASTECTOMY WITH AXILLARY LYMPH NODE DISSECTION N/A 11/06/2018   Procedure: BILATERAL TOTAL MASTECTOMY WITH AXILLARY LYMPH NODE DISSECTION;  Surgeon: Fanny Skates, MD;  Location: [place];  Service: General;  Laterality: N/A;   BREAST BIOPSY Right 11/08/2013   Procedure: EXCISION OF RIGHT  BREAST MASS;  Surgeon: Adin Hector, MD;  Location: [facility];  Service: General;  Laterality: Right;   BREAST RECONSTRUCTION WITH MASTOPLASTY Bilateral 08/03/2019   Procedure: Revision bilateral mastectomy flaps;  Surgeon: Irene Limbo, MD;  Location: [place];  Service: Plastics;  Laterality: Bilateral;   BUNIONECTOMY Left    CARPAL TUNNEL RELEASE Right 02/26/2017   CERVICAL CONE BIOPSY  05/2005   COLONOSCOPY  2014   DRESSING CHANGE UNDER ANESTHESIA Right 11/29/2018   Procedure: DRESSING CHANGE UNDER ANESTHESIA;  Surgeon: Fanny Skates, MD;  Location: [place];  Service: General;  Laterality: Right;   IRRIGATION AND DEBRIDEMENT ABSCESS Right 11/28/2018   Procedure: Exploration and Drainage of Right Mastectomy wound.;  Surgeon: Fanny Skates, MD;  Location: [place];  Service: General;  Laterality: Right;   PORT-A-CATH REMOVAL N/A 11/06/2018   Procedure: Removal Port-A-Cath;  Surgeon: Fanny Skates, MD;  Location: [place];  Service: General;  Laterality: N/A;   PORTACATH PLACEMENT Right 07/08/2018   Procedure: INSERTION PORT-A-CATH;  Surgeon: Fanny Skates, MD;  Location: [place];  Service: General;  Laterality: Right;   SHOULDER ARTHROSCOPY W/ LABRAL  REPAIR Right    SHOULDER ARTHROSCOPY W/ ROTATOR CUFF REPAIR Left    TENOTOMY FOREARM / WRIST Right 02/26/2017   Family History  Problem Relation Age of Onset   Breast cancer Mother 75       triple negative, GT neg   Diabetes Maternal Grandmother    Stroke Paternal Grandfather    Diabetes Maternal Aunt    Bladder Cancer Maternal Aunt 69   Colon cancer Maternal Uncle 65   Diabetes  Maternal Uncle    Colon cancer Other 60   Breast cancer Other        dx >50   Colon cancer Other        dx>50   Pancreatic cancer Other    Esophageal cancer Neg Hx    Rectal cancer Neg Hx    Social History   Tobacco Use   Smoking status: Never   Smokeless tobacco: Never  Vaping Use   Vaping Use: Never used  Substance Use Topics   Alcohol use: Not Currently    Comment: rare   Drug use: No   Current Outpatient Medications  Medication Sig Dispense Refill   aspirin EC 81 MG tablet Take 1 tablet (81 mg total) by mouth daily. Swallow whole. 30 tablet 11   EPINEPHrine 0.3 mg/0.3 mL IJ SOAJ injection Inject 0.3 mg into the skin as needed for anaphylaxis.     Multiple Vitamin (MULTIVITAMIN WITH MINERALS) TABS tablet Take 1 tablet by mouth daily.     gabapentin (NEURONTIN) 600 MG tablet Take 600 mg by mouth 3 (three) times daily.     ondansetron (ZOFRAN-ODT) 4 MG disintegrating tablet Take 4 mg by mouth every 8 (eight) hours as needed.     No current facility-administered medications for this visit.   Allergies  Allergen Reactions   Bee Venom Anaphylaxis   Dihydroergotamine Anaphylaxis   Dhea     Other reaction(s): Other   Imitrex [Sumatriptan] Other (See Comments)    SEIZURE-LIKE ACTIVITY   Metoclopramide Other (See Comments)    TACHYCARDIA   Nitroglycerin     Other reaction(s): Other unknown   Transderm-Scop [Scopolamine] Other (See Comments)    AGITATION     Review of Systems: All systems reviewed and negative except where noted in HPI.     Physical Exam:    Wt Readings from Last 3 Encounters:  08/10/21 149 lb 8 oz (67.8 kg)  01/15/21 181 lb 1.6 oz (82.1 kg)  01/20/20 187 lb 14.4 oz (85.2 kg)    BP 102/70    Pulse 95    Ht 5\' 7"  (1.702 m)    Wt 149 lb 8 oz (67.8 kg)    SpO2 98%    BMI 23.42 kg/m  Constitutional:  Pleasant, in no acute distress. Psychiatric: Normal mood and affect. Behavior is normal. Cardiovascular: Normal rate, regular rhythm. No  edema Pulmonary/chest: Effort normal and breath sounds normal. No wheezing, rales or rhonchi. Abdominal: Mild TTP in epigastrium without rebound or guarding.  No peritoneal signs.  Soft, nondistended. Bowel sounds active throughout. There are no masses palpable. No hepatomegaly. Neurological: Alert and oriented to person place and time.   ASSESSMENT AND PLAN;   1) Jejunal Intussusception Recent CT with incidentally noted jejunal intussusception without obstruction or upstream small bowel dilatation.  Agree with the Radiologist that this is likely a benign transient finding.  We discussed diagnostic options to include observation alone/conservative management vs CT enterography vs VCE.  Recommend against VCE given increased possibility of capsule  retention.  She ultimately would like to proceed with CT enterography out of an abundance of caution given her clinical history and elevated concerns. -Order CT enterography to rule out small bowel pathology  2) Hematochezia - Check CBC - Colonoscopy to evaluate for mucosal/luminal pathology  3) History of colon polyps 4) Family history of advanced colon polyps - Has had colonoscopy x3 in the past.  We will try to obtain prior records for review  5) Nausea/Vomiting 6) Epigastric pain - EGD to evaluate for mucosal/luminal pathology to include PUD, gastritis, GOO, etc.   The indications, risks, and benefits of EGD and colonoscopy were explained to the patient in detail. Risks include but are not limited to bleeding, perforation, adverse reaction to medications, and cardiopulmonary compromise. Sequelae include but are not limited to the possibility of surgery, hospitalization, and mortality. The patient verbalized understanding and wished to proceed. All questions answered, referred to scheduler and bowel prep ordered. Further recommendations pending results of the exam.    Lavena Bullion, DO, FACG  08/10/2021, 8:25 AM   [name], NP

## 2021-08-14 ENCOUNTER — Telehealth: Payer: Self-pay | Admitting: Gastroenterology

## 2021-08-14 NOTE — Telephone Encounter (Signed)
Case for pt's CT ENTERO ABDOMEN PELVIS W CONTRAST was denied by [employer] due to a similar study was already done and approved for this pt on 07/20/21.   Please advise if a peer to peer needs to be scheduled or reconsideration initiated.

## 2021-08-17 ENCOUNTER — Ambulatory Visit (HOSPITAL_COMMUNITY): Payer: Managed Care, Other (non HMO)

## 2021-08-20 ENCOUNTER — Encounter: Payer: Self-pay | Admitting: Gastroenterology

## 2021-08-23 NOTE — Telephone Encounter (Signed)
Around what time should I schedule the peer to peer for tomorrow, @4pm  after clinic?

## 2021-08-24 ENCOUNTER — Other Ambulatory Visit: Payer: Self-pay

## 2021-08-24 ENCOUNTER — Ambulatory Visit (HOSPITAL_COMMUNITY)
Admission: RE | Admit: 2021-08-24 | Discharge: 2021-08-24 | Disposition: A | Discharge status: Home / Self Care | Payer: Managed Care, Other (non HMO) | Source: Ambulatory Visit | Attending: Gastroenterology | Admitting: Gastroenterology

## 2021-08-24 DIAGNOSIS — R112 Nausea with vomiting, unspecified: Secondary | ICD-10-CM | POA: Diagnosis present

## 2021-08-24 DIAGNOSIS — R1013 Epigastric pain: Secondary | ICD-10-CM | POA: Insufficient documentation

## 2021-08-24 MED ORDER — IOHEXOL 300 MG/ML  SOLN
100.0000 mL | Freq: Once | INTRAMUSCULAR | Status: AC | PRN
  Administered 2021-08-24: 100 mL via INTRAVENOUS

## 2021-08-24 MED ORDER — BARIUM SULFATE 0.1 % PO SUSP
ORAL | Status: AC
  Administered 2021-08-24: 1 mL
  Filled 2021-08-24: qty 3

## 2021-08-24 MED ORDER — SODIUM CHLORIDE (PF) 0.9 % IJ SOLN
INTRAMUSCULAR | Status: AC
  Filled 2021-08-24: qty 50

## 2021-08-24 NOTE — Telephone Encounter (Signed)
I spoke with Dr. Kristine Linea via peer-to-peer today.  The CT enterography has been approved with approval number [id number].  Please call to update the patient that the study has been approved and can move forward with scheduling.  Thank you.

## 2021-08-24 NOTE — Telephone Encounter (Signed)
Great, the peer to peer has been scheduled for today 08/24/21 @4 :15pm. Dr Bertell Maria will be calling around that time from a [phone] number. Thank you!

## 2021-08-24 NOTE — Telephone Encounter (Signed)
Case# [id number]

## 2021-08-30 NOTE — Progress Notes (Signed)
Cardiology Office Note:    Date:  08/31/2021   ID:  Holly Wiley, DOB [birth year], MRN [id number]  PCP:  Holly Hickman, FNP  Cardiologist:  Holly More, MD   Referring MD: Holly Lose, MD  ASSESSMENT:    1. Pericardial effusion    PLAN:    In order of problems listed above:  I suspect her pericardial effusion is trivial generally overestimated on CT scan is chronic being noted in 2020 and unrelated to her breast cancer and chemotherapy.  She is asymptomatic has no evidence of increased venous pressure on physical exam and for completeness we will do echocardiogram.  If reassuring no effusion or trivial I will see her back in the office as needed. I encouraged her to get a smart watch to try to capture her episodes of palpitation.  She can send strips to me through MyChart in the future  Next appointment as needed   Medication Adjustments/Labs and Tests Ordered: Current medicines are reviewed at length with the patient today.  Concerns regarding medicines are outlined above.  Orders Placed This Encounter  Procedures   EKG 12-Lead   ECHOCARDIOGRAM COMPLETE   No orders of the defined types were placed in this encounter.    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  CT of chest 07/21/2021 IMPRESSION: 1. Status post bilateral mastectomies and right axillary lymph node dissection. No findings for local recurrent or metastatic disease involving the chest, abdomen or pelvis. 2. Small pericardial effusion.  Not reported on previous CT 09/22/2020 however it was present 11/26/2018. 3. Incidental jejunal intussusception, likely a  benign transient process. No obstruction or mass is identified.  Echocardiogram report 09/08/2018 which showed no pericardial effusion otherwise normal.   1. The left ventricle has normal systolic function with an ejection  fraction of 60-65%. The cavity size was normal. Left ventricular diastolic  Doppler parameters are consistent with impaired relaxation.   2. The right ventricle has normal systolic function. The cavity was  normal. There is no increase in right ventricular wall thickness.   3. The aortic valve is tricuspid.   I know her well her father was a Engineer, water in sports of disease in [place].  He passed in the last year predominantly due to dementia. She has had surgery bilateral mastectomy and chemotherapy including Adriamycin but no radiation therapy She has no known history of heart disease congenital rheumatic murmur or arrhythmia but at times she has palpitation not severe or sustained I reviewed records and she was unaware she had a small pericardial effusion in 2020 by CT but in close proximity no effusion by echocardiogram at that time No evidence of recurrent breast cancer No edema shortness of breath. 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   Migraines  PONV (postoperative nausea and vomiting)     Past Surgical History:  Procedure Laterality Date   BILATERAL TOTAL MASTECTOMY WITH AXILLARY LYMPH NODE DISSECTION N/A 11/06/2018   Procedure: BILATERAL TOTAL MASTECTOMY WITH AXILLARY LYMPH NODE DISSECTION;  Surgeon: Fanny Skates, MD;  Location: [place];  Service: General;  Laterality: N/A;   BREAST BIOPSY Right 11/08/2013   Procedure: EXCISION OF RIGHT  BREAST MASS;   Surgeon: Adin Hector, MD;  Location: [place];  Service: General;  Laterality: Right;   BREAST RECONSTRUCTION WITH MASTOPLASTY Bilateral 08/03/2019   Procedure: Revision bilateral mastectomy flaps;  Surgeon: Irene Limbo, MD;  Location: [place];  Service: Plastics;  Laterality: Bilateral;   BUNIONECTOMY Left    CARPAL TUNNEL RELEASE Right 02/26/2017   CERVICAL CONE BIOPSY  05/2005   COLONOSCOPY  2014   DRESSING CHANGE UNDER ANESTHESIA Right 11/29/2018   Procedure: DRESSING CHANGE UNDER ANESTHESIA;  Surgeon: Fanny Skates, MD;  Location: [place];  Service: General;  Laterality: Right;   IRRIGATION AND DEBRIDEMENT ABSCESS Right 11/28/2018   Procedure: Exploration and Drainage of Right Mastectomy wound.;  Surgeon: Fanny Skates, MD;  Location: [place];  Service: General;  Laterality: Right;   PORT-A-CATH REMOVAL N/A 11/06/2018   Procedure: Removal Port-A-Cath;  Surgeon: Fanny Skates, MD;  Location: [place];  Service: General;  Laterality: N/A;   PORTACATH PLACEMENT Right 07/08/2018   Procedure: INSERTION PORT-A-CATH;  Surgeon: Fanny Skates, MD;  Location: [place];  Service: General;  Laterality: Right;   SHOULDER ARTHROSCOPY W/ LABRAL REPAIR Right    SHOULDER ARTHROSCOPY W/ ROTATOR CUFF REPAIR Left    TENOTOMY FOREARM / WRIST Right 02/26/2017    Current Medications: Current Meds  Medication Sig   aspirin EC 81 MG tablet Take 1 tablet (81 mg total) by mouth daily. Swallow whole.   EPINEPHrine 0.3 mg/0.3 mL IJ SOAJ injection Inject 0.3 mg into the skin as needed for anaphylaxis.   gabapentin (NEURONTIN) 600 MG tablet Take 600 mg by mouth 3 (three) times daily.   Multiple Vitamin (MULTIVITAMIN WITH MINERALS) TABS tablet Take 1 tablet by mouth daily.   ondansetron (ZOFRAN-ODT) 4 MG disintegrating tablet Take 4 mg by mouth every 8 (eight) hours as needed for nausea.   Sod Picosulfate-Mag Ox-Cit Acd (CLENPIQ) 10-3.5-12  MG-GM -GM/160ML SOLN Take 1 kit by mouth as directed.     Allergies:   Bee venom, Dihydroergotamine, Dhea, Imitrex [sumatriptan], Metoclopramide, Nitroglycerin, and Transderm-scop [scopolamine]   Social History   Socioeconomic History   Marital status: Divorced    Spouse name: Not on file   Number of children: Not on file   Years of education: Not on file   Highest education level: Not on file  Occupational History   Occupation: Hospice nurse  Tobacco Use   Smoking status: Never    Passive exposure: Never   Smokeless tobacco: Never  Vaping Use   Vaping Use: Never used  Substance and Sexual Activity   Alcohol use: Not Currently    Comment: rare   Drug use: No   Sexual activity: Not Currently    Birth control/protection: Abstinence  Other Topics Concern   Not on file  Social History Narrative   Not on file   Social Determinants of Health   Financial Resource Strain: Not on file  Food Insecurity: Not on file  Transportation Needs: Not on file  Physical Activity: Not on file  Stress: Not on file  Social Connections: Not on file  Family History: The patient's family history includes Bladder Cancer (age of onset: 58) in her maternal aunt; Breast cancer in an other family member; Breast cancer (age of onset: 11) in her mother; Colon cancer in an other family member; Colon cancer (age of onset: 58) in an other family member; Colon cancer (age of onset: 76) in her maternal uncle; Diabetes in her maternal aunt, maternal grandmother, and maternal uncle; Pancreatic cancer in an other family member; Stroke in her paternal grandfather. There is no history of Esophageal cancer or Rectal cancer.  ROS:   ROS Please see the history of present illness.     All other systems reviewed and are negative.  EKGs/Labs/Other Studies Reviewed:    The following studies were reviewed today:   EKG:  EKG is  ordered today.  The ekg ordered today is personally reviewed and  demonstrates sinus rhythm normal EKG rightward QRS axis  Recent Labs: 08/10/2021: Hemoglobin 14.0; Platelets 189.0  Recent Lipid Panel No results found for: CHOL, TRIG, HDL, CHOLHDL, VLDL, LDLCALC, LDLDIRECT  Physical Exam:    VS:  BP 102/70 (BP Location: Right Arm)    Pulse 97    Ht _0  (1.702 m)    Wt 149 lb 12.8 oz (67.9 kg)    SpO2 99%    BMI 23.46 kg/m     Wt Readings from Last 3 Encounters:  08/31/21 149 lb 12.8 oz (67.9 kg)  08/10/21 149 lb 8 oz (67.8 kg)  01/15/21 181 lb 1.6 oz (82.1 kg)     GEN:  Well nourished, well developed in no acute distress HEENT: Normal NECK: No JVD; No carotid bruits LYMPHATICS: No lymphadenopathy CARDIAC: RRR, no murmurs, rubs, gallops RESPIRATORY:  Clear to auscultation without rales, wheezing or rhonchi  ABDOMEN: Soft, non-tender, non-distended MUSCULOSKELETAL:  No edema; No deformity  SKIN: Warm and dry NEUROLOGIC:  Alert and oriented x 3 PSYCHIATRIC:  Normal affect     Signed, Holly More, MD  08/31/2021 9:48 AM    [facility]

## 2021-08-31 ENCOUNTER — Ambulatory Visit: Payer: BC Managed Care – PPO | Admitting: Cardiology

## 2021-08-31 ENCOUNTER — Ambulatory Visit: Payer: Managed Care, Other (non HMO) | Admitting: Cardiology

## 2021-08-31 ENCOUNTER — Encounter: Payer: Self-pay | Admitting: Cardiology

## 2021-08-31 ENCOUNTER — Other Ambulatory Visit: Payer: Self-pay

## 2021-08-31 VITALS — BP 102/70 | HR 97 | Ht 67.0 in | Wt 149.8 lb

## 2021-08-31 DIAGNOSIS — I3139 Other pericardial effusion (noninflammatory): Secondary | ICD-10-CM | POA: Diagnosis not present

## 2021-08-31 DIAGNOSIS — R002 Palpitations: Secondary | ICD-10-CM

## 2021-08-31 NOTE — Patient Instructions (Signed)
Medication Instructions:  ? ?Your physician recommends that you continue on your current medications as directed. Please refer to the Current Medication list given to you today.' ? ? ?*If you need a refill on your cardiac medications before your next appointment, please call your pharmacy* ? ? ?Lab Work: [place] ? ? ? ?If you have labs (blood work) drawn today and your tests are completely normal, you will receive your results only by: ?MyChart Message (if you have MyChart) OR ?A paper copy in the mail ?If you have any lab test that is abnormal or we need to change your treatment, we will call you to review the results. ? ? ?Testing/Procedures: Your physician has requested that you have an echocardiogram. Echocardiography is a painless test that uses sound waves to create images of your heart. It provides your doctor with information about the size and shape of your heart and how well your heart?s chambers and valves are working. This procedure takes approximately one hour. There are no restrictions for this procedure. ? ? ?Follow-Up: ?At [facility], you and your health needs are our priority.  As part of our continuing mission to provide you with exceptional heart care, we have created designated Provider Care Teams.  These Care Teams include your primary Cardiologist (physician) and Advanced Practice Providers (APPs -  Physician Assistants and Nurse Practitioners) who all work together to provide you with the care you need, when you need it. ? ?We recommend signing up for the patient portal called "MyChart".  Sign up information is provided on this After Visit Summary.  MyChart is used to connect with patients for Virtual Visits (Telemedicine).  Patients are able to view lab/test results, encounter notes, upcoming appointments, etc.  Non-urgent messages can be sent to your provider as well.   ?To learn more about what you can do with MyChart, go to [email or web address].   ? ?Your next  appointment:   CONTACT [facility] [phone]  AS NEEDED FOR  ANY CARDIAC RELATED SYMPTOMS ? ?The format for your next appointment:   ?In Person ? ?Provider:   ?Shirlee More, MD  ? ? ?Other Instructions ? ?

## 2021-09-07 ENCOUNTER — Ambulatory Visit (INDEPENDENT_AMBULATORY_CARE_PROVIDER_SITE_OTHER): Payer: Managed Care, Other (non HMO)

## 2021-09-07 ENCOUNTER — Other Ambulatory Visit: Payer: Self-pay

## 2021-09-07 DIAGNOSIS — I3139 Other pericardial effusion (noninflammatory): Secondary | ICD-10-CM | POA: Diagnosis not present

## 2021-09-07 LAB — ECHOCARDIOGRAM COMPLETE
Area-P 1/2: 2.87 cm2
S' Lateral: 3 cm

## 2021-09-10 ENCOUNTER — Encounter: Payer: Self-pay | Admitting: Gastroenterology

## 2021-09-17 ENCOUNTER — Ambulatory Visit (AMBULATORY_SURGERY_CENTER): Payer: Managed Care, Other (non HMO) | Admitting: Gastroenterology

## 2021-09-17 ENCOUNTER — Other Ambulatory Visit: Payer: Self-pay | Admitting: Gastroenterology

## 2021-09-17 ENCOUNTER — Other Ambulatory Visit: Payer: Self-pay

## 2021-09-17 ENCOUNTER — Encounter: Payer: Self-pay | Admitting: Gastroenterology

## 2021-09-17 VITALS — BP 100/54 | HR 75 | Temp 98.6°F | Resp 14 | Ht 67.0 in | Wt 149.0 lb

## 2021-09-17 DIAGNOSIS — R1013 Epigastric pain: Secondary | ICD-10-CM

## 2021-09-17 DIAGNOSIS — K641 Second degree hemorrhoids: Secondary | ICD-10-CM

## 2021-09-17 DIAGNOSIS — Z86010 Personal history of colonic polyps: Secondary | ICD-10-CM

## 2021-09-17 DIAGNOSIS — R112 Nausea with vomiting, unspecified: Secondary | ICD-10-CM | POA: Diagnosis not present

## 2021-09-17 DIAGNOSIS — K2950 Unspecified chronic gastritis without bleeding: Secondary | ICD-10-CM | POA: Diagnosis not present

## 2021-09-17 DIAGNOSIS — K2970 Gastritis, unspecified, without bleeding: Secondary | ICD-10-CM | POA: Diagnosis not present

## 2021-09-17 MED ORDER — SODIUM CHLORIDE 0.9 % IV SOLN: 500.0000 mL | Freq: Once | INTRAVENOUS | Status: DC

## 2021-09-17 NOTE — Progress Notes (Signed)
Called to room to assist during endoscopic procedure.  Patient ID and intended procedure confirmed with present staff. Received instructions for my participation in the procedure from the performing physician.  

## 2021-09-17 NOTE — Op Note (Signed)
[place] ?Patient Name: Holly Wiley ?Procedure Date: 09/17/2021 8:52 AM ?MRN: [id number] ?Endoscopist: Gerrit Heck , MD ?Age: 46 ?Referring MD:  ?Date of Birth: 19-Jan-1976 ?Gender: Female ?Account #: [id number] ?Procedure:                Colonoscopy ?Indications:              Colon cancer screening in patient with multiple  ?                          1st-degree relatives having advanced adenomas of  ?                          the colon, 1 second degree relative with colon  ?                          cancer, and personal history of colonic polyps ?Medicines:                Monitored Anesthesia Care ?Procedure:                Pre-Anesthesia Assessment: ?                          - Prior to the procedure, a History and Physical  ?                          was performed, and patient medications and  ?                          allergies were reviewed. The patient's tolerance of  ?                          previous anesthesia was also reviewed. The risks  ?                          and benefits of the procedure and the sedation  ?                          options and risks were discussed with the patient.  ?                          All questions were answered, and informed consent  ?                          was obtained. Prior Anticoagulants: The patient has  ?                          taken no previous anticoagulant or antiplatelet  ?                          agents. ASA Grade Assessment: II - A patient with  ?                          mild systemic disease. After reviewing the risks  ?  and benefits, the patient was deemed in  ?                          satisfactory condition to undergo the procedure. ?                          After obtaining informed consent, the colonoscope  ?                          was passed under direct vision. Throughout the  ?                          procedure, the patient's blood pressure, pulse, and  ?                          oxygen saturations  were monitored continuously. The  ?                          CF [id number] #[id number] was introduced through the anus  ?                          and advanced to the the cecum, identified by  ?                          appendiceal orifice and ileocecal valve. The  ?                          colonoscopy was performed without difficulty. The  ?                          patient tolerated the procedure well. The quality  ?                          of the bowel preparation was good. The ileocecal  ?                          valve, appendiceal orifice, and rectum were  ?                          photographed. ?Scope In: 9:09:23 AM ?Scope Out: 9:21:22 AM ?Scope Withdrawal Time: 0 hours 8 minutes 41 seconds  ?Total Procedure Duration: 0 hours 11 minutes 59 seconds  ?Findings:                 Hemorrhoids were found on perianal exam. ?                          The colon appeared normal. ?                          Non-bleeding internal hemorrhoids were found during  ?                          retroflexion. The hemorrhoids were small and Grade  ?  II (internal hemorrhoids that prolapse but reduce  ?                          spontaneously). ?Complications:            No immediate complications. ?Estimated Blood Loss:     Estimated blood loss was minimal. ?Impression:               - Hemorrhoids found on perianal exam. ?                          - The entire examined colon is normal. ?                          - Non-bleeding internal hemorrhoids. ?                          - No specimens collected. ?Recommendation:           - Patient has a contact number available for  ?                          emergencies. The signs and symptoms of potential  ?                          delayed complications were discussed with the  ?                          patient. Return to normal activities tomorrow.  ?                          Written discharge instructions were provided to the  ?                          patient. ?                           - Resume previous diet. ?                          - Continue present medications. ?                          - Repeat colonoscopy in 5 years for surveillance  ?                          due to personal history of polyps and family  ?                          history. ?                          - Return to GI clinic PRN. ?Gerrit Heck, MD ?09/17/2021 9:33:55 AM ?

## 2021-09-17 NOTE — Progress Notes (Signed)
? ?GASTROENTEROLOGY PROCEDURE H&P NOTE  ? ?Primary Care Physician: ?Madison Hickman, FNP ? ? ? ?Reason for Procedure:   Colon cancer screening, polyp surveillance, nausea/vomiting, upper abdominal pain, Fhx of colon polyps ? ?Plan:    EGD, Colonoscopy ? ?Patient is appropriate for endoscopic procedure(s) in the ambulatory ([place]) setting. ? ?The nature of the procedure, as well as the risks, benefits, and alternatives were carefully and thoroughly reviewed with the patient. Ample time for discussion and questions allowed. The patient understood, was satisfied, and agreed to proceed.  ? ? ? ?HPI: ?Holly Wiley is a 46 y.o. female who presents for EGD for evaluation of n/v and MEG pain and colonoscopy for ongoing polyp surveillance.  ? ?Past Medical History:  ?Diagnosis Date  ? Breast cancer ([place]) 07/2018  ? right  ? Dental crown present   ? Family history of bladder cancer   ? Family history of breast cancer   ? Family history of colon cancer   ? Family history of pancreatic cancer   ? History of seizure 2014  ? secondary to head injury/post-concussive syndrome  ? Migraines   ? PONV (postoperative nausea and vomiting)   ? ? ?Past Surgical History:  ?Procedure Laterality Date  ? BILATERAL TOTAL MASTECTOMY WITH AXILLARY LYMPH NODE DISSECTION N/A 11/06/2018  ? Procedure: BILATERAL TOTAL MASTECTOMY WITH AXILLARY LYMPH NODE DISSECTION;  Surgeon: Fanny Skates, MD;  Location: [place];  Service: General;  Laterality: N/A;  ? BREAST BIOPSY Right 11/08/2013  ? Procedure: EXCISION OF RIGHT  BREAST MASS;  Surgeon: Adin Hector, MD;  Location: [place];  Service: General;  Laterality: Right;  ? BREAST RECONSTRUCTION WITH MASTOPLASTY Bilateral 08/03/2019  ? Procedure: Revision bilateral mastectomy flaps;  Surgeon: Irene Limbo, MD;  Location: [place];  Service: Plastics;  Laterality: Bilateral;  ? BUNIONECTOMY Left   ? CARPAL TUNNEL RELEASE Right 02/26/2017  ? CERVICAL CONE BIOPSY  05/2005   ? COLONOSCOPY  2014  ? DRESSING CHANGE UNDER ANESTHESIA Right 11/29/2018  ? Procedure: DRESSING CHANGE UNDER ANESTHESIA;  Surgeon: Fanny Skates, MD;  Location: [place];  Service: General;  Laterality: Right;  ? IRRIGATION AND DEBRIDEMENT ABSCESS Right 11/28/2018  ? Procedure: Exploration and Drainage of Right Mastectomy wound.;  Surgeon: Fanny Skates, MD;  Location: [place];  Service: General;  Laterality: Right;  ? PORT-A-CATH REMOVAL N/A 11/06/2018  ? Procedure: Removal Port-A-Cath;  Surgeon: Fanny Skates, MD;  Location: [place];  Service: General;  Laterality: N/A;  ? PORTACATH PLACEMENT Right 07/08/2018  ? Procedure: INSERTION PORT-A-CATH;  Surgeon: Fanny Skates, MD;  Location: [place];  Service: General;  Laterality: Right;  ? SHOULDER ARTHROSCOPY W/ LABRAL REPAIR Right   ? SHOULDER ARTHROSCOPY W/ ROTATOR CUFF REPAIR Left   ? TENOTOMY FOREARM / WRIST Right 02/26/2017  ? ? ?Prior to Admission medications   ?Medication Sig Start Date End Date Taking? Authorizing Provider  ?aspirin EC 81 MG tablet Take 1 tablet (81 mg total) by mouth daily. Swallow whole. 01/15/21  Yes Nicholas Lose, MD  ?gabapentin (NEURONTIN) 600 MG tablet Take 600 mg by mouth 3 (three) times daily. 07/09/21  Yes [provider]  ?Multiple Vitamin (MULTIVITAMIN WITH MINERALS) TABS tablet Take 1 tablet by mouth daily.   Yes [provider]  ?ondansetron (ZOFRAN-ODT) 4 MG disintegrating tablet Take 4 mg by mouth every 8 (eight) hours as needed for nausea. 07/02/21  Yes [provider]  ?EPINEPHrine 0.3 mg/0.3 mL IJ SOAJ injection  Inject 0.3 mg into the skin as needed for anaphylaxis. 11/13/17   [provider]  ? ? ?Current Outpatient Medications  ?Medication Sig Dispense Refill  ? aspirin EC 81 MG tablet Take 1 tablet (81 mg total) by mouth daily. Swallow whole. 30 tablet 11  ? gabapentin (NEURONTIN) 600 MG tablet Take 600 mg by mouth 3 (three) times daily.    ? Multiple Vitamin (MULTIVITAMIN WITH  MINERALS) TABS tablet Take 1 tablet by mouth daily.    ? ondansetron (ZOFRAN-ODT) 4 MG disintegrating tablet Take 4 mg by mouth every 8 (eight) hours as needed for nausea.    ? EPINEPHrine 0.3 mg/0.3 mL IJ SOAJ injection Inject 0.3 mg into the skin as needed for anaphylaxis.    ? ?Current Facility-Administered Medications  ?Medication Dose Route Frequency Provider Last Rate Last Admin  ? 0.9 %  sodium chloride infusion  500 mL Intravenous Once Emanuele Mcwhirter V, DO      ? ? ?Allergies as of 09/17/2021 - Review Complete 09/17/2021  ?Allergen Reaction Noted  ? Bee venom Anaphylaxis 11/01/2013  ? Dihydroergotamine Anaphylaxis 07/03/2018  ? Dhea  11/02/2006  ? Imitrex [sumatriptan] Other (See Comments) 01/01/2012  ? Metoclopramide Other (See Comments) 06/29/2018  ? Nitroglycerin  04/09/2016  ? Transderm-scop [scopolamine] Other (See Comments) 02/28/2017  ? ? ?Family History  ?Problem Relation Age of Onset  ? Breast cancer Mother 68  ?     triple negative, GT neg  ? Diabetes Maternal Grandmother   ? Stroke Paternal Grandfather   ? Diabetes Maternal Aunt   ? Bladder Cancer Maternal Aunt 57  ? Colon cancer Maternal Uncle 18  ? Diabetes Maternal Uncle   ? Colon cancer Other 50  ? Breast cancer Other   ?     dx >50  ? Colon cancer Other   ?     dx>50  ? Pancreatic cancer Other   ? Esophageal cancer Neg Hx   ? Rectal cancer Neg Hx   ? ? ?Social History  ? ?Socioeconomic History  ? Marital status: Divorced  ?  Spouse name: Not on file  ? Number of children: Not on file  ? Years of education: Not on file  ? Highest education level: Not on file  ?Occupational History  ? Occupation: Hospice nurse  ?Tobacco Use  ? Smoking status: Never  ?  Passive exposure: Never  ? Smokeless tobacco: Never  ?Vaping Use  ? Vaping Use: Never used  ?Substance and Sexual Activity  ? Alcohol use: Not Currently  ?  Comment: rare  ? Drug use: No  ? Sexual activity: Not Currently  ?  Birth control/protection: Abstinence  ?Other Topics Concern  ? Not on  file  ?Social History Narrative  ? Not on file  ? ?Social Determinants of Health  ? ?Financial Resource Strain: Not on file  ?Food Insecurity: Not on file  ?Transportation Needs: Not on file  ?Physical Activity: Not on file  ?Stress: Not on file  ?Social Connections: Not on file  ?Intimate Partner Violence: Not on file  ? ? ?Physical Exam: ?Vital signs in last 24 hours: ?'@BP'$  100/68   Pulse 92   Temp 98.6 ?F (37 ?C)   Ht '5\' 7"'$  (1.702 m)   Wt 149 lb (67.6 kg)   SpO2 96%   BMI 23.34 kg/m?  ?GEN: NAD ?EYE: Sclerae anicteric ?ENT: MMM ?CV: Non-tachycardic ?Pulm: CTA b/l ?GI: Soft, NT/ND ?NEURO:  Alert & Oriented x 3 ? ? ?Gerrit Heck, DO ?  [facility] ? ? ?09/17/2021 8:52 AM ? ?

## 2021-09-17 NOTE — Patient Instructions (Signed)
Discharge instructions given. ?Handout on Hemorrhoids. ?Resume previous medications. ?YOU HAD AN ENDOSCOPIC PROCEDURE TODAY AT [facility]:   Refer to the procedure report that was given to you for any specific questions about what was found during the examination.  If the procedure report does not answer your questions, please call your gastroenterologist to clarify.  If you requested that your care partner not be given the details of your procedure findings, then the procedure report has been included in a sealed envelope for you to review at your convenience later. ? ?YOU SHOULD EXPECT: Some feelings of bloating in the abdomen. Passage of more gas than usual.  Walking can help get rid of the air that was put into your GI tract during the procedure and reduce the bloating. If you had a lower endoscopy (such as a colonoscopy or flexible sigmoidoscopy) you may notice spotting of blood in your stool or on the toilet paper. If you underwent a bowel prep for your procedure, you may not have a normal bowel movement for a few days. ? ?Please Note:  You might notice some irritation and congestion in your nose or some drainage.  This is from the oxygen used during your procedure.  There is no need for concern and it should clear up in a day or so. ? ?SYMPTOMS TO REPORT IMMEDIATELY: ? ?Following lower endoscopy (colonoscopy or flexible sigmoidoscopy): ? Excessive amounts of blood in the stool ? Significant tenderness or worsening of abdominal pains ? Swelling of the abdomen that is new, acute ? Fever of 100?F or higher ? ?Following upper endoscopy (EGD) ? Vomiting of blood or coffee ground material ? New chest pain or pain under the shoulder blades ? Painful or persistently difficult swallowing ? New shortness of breath ? Fever of 100?F or higher ? Black, tarry-looking stools ? ?For urgent or emergent issues, a gastroenterologist can be reached at any hour by calling [phone]. ?Do not use MyChart  messaging for urgent concerns.  ? ? ?DIET:  We do recommend a small meal at first, but then you may proceed to your regular diet.  Drink plenty of fluids but you should avoid alcoholic beverages for 24 hours. ? ?ACTIVITY:  You should plan to take it easy for the rest of today and you should NOT DRIVE or use heavy machinery until tomorrow (because of the sedation medicines used during the test).   ? ?FOLLOW UP: ?Our staff will call the number listed on your records 48-72 hours following your procedure to check on you and address any questions or concerns that you may have regarding the information given to you following your procedure. If we do not reach you, we will leave a message.  We will attempt to reach you two times.  During this call, we will ask if you have developed any symptoms of COVID 19. If you develop any symptoms (ie: fever, flu-like symptoms, shortness of breath, cough etc.) before then, please call [phone].  If you test positive for Covid 19 in the 2 weeks post procedure, please call and report this information to [place].   ? ?If any biopsies were taken you will be contacted by phone or by letter within the next 1-3 weeks.  Please call us at [phone] if you have not heard about the biopsies in 3 weeks.  ? ? ?SIGNATURES/CONFIDENTIALITY: ?You and/or your care partner have signed paperwork which will be entered into your electronic medical record.  These signatures attest to the  fact that that the information above on your After Visit Summary has been reviewed and is understood.  Full responsibility of the confidentiality of this discharge information lies with you and/or your care-partner.  ?

## 2021-09-17 NOTE — Progress Notes (Signed)
Sedate, gd SR, tolerated procedure well, VSS, report to RN 

## 2021-09-17 NOTE — Progress Notes (Signed)
Vs by CW 

## 2021-09-17 NOTE — Op Note (Signed)
[place] ?Patient Name: Holly Wiley ?Procedure Date: 09/17/2021 8:53 AM ?MRN: [id number] ?Endoscopist: Gerrit Heck , MD ?Age: 46 ?Referring MD:  ?Date of Birth: [birth year] ?Gender: Female ?Account #: [id number] ?Procedure:                Upper GI endoscopy ?Indications:              Epigastric abdominal pain, Nausea with vomiting ?Medicines:                Monitored Anesthesia Care ?Procedure:                Pre-Anesthesia Assessment: ?                          - Prior to the procedure, a History and Physical  ?                          was performed, and patient medications and  ?                          allergies were reviewed. The patient's tolerance of  ?                          previous anesthesia was also reviewed. The risks  ?                          and benefits of the procedure and the sedation  ?                          options and risks were discussed with the patient.  ?                          All questions were answered, and informed consent  ?                          was obtained. Prior Anticoagulants: The patient has  ?                          taken no previous anticoagulant or antiplatelet  ?                          agents. ASA Grade Assessment: II - A patient with  ?                          mild systemic disease. After reviewing the risks  ?                          and benefits, the patient was deemed in  ?                          satisfactory condition to undergo the procedure. ?                          After obtaining informed consent, the endoscope was  ?  passed under direct vision. Throughout the  ?                          procedure, the patient's blood pressure, pulse, and  ?                          oxygen saturations were monitored continuously. The  ?                          GIF [id number] #[id number] was introduced through the  ?                          mouth, and advanced to the second part of duodenum.  ?                          The  upper GI endoscopy was accomplished without  ?                          difficulty. The patient tolerated the procedure  ?                          well. ?Scope In: ?Scope Out: ?Findings:                 The examined esophagus was normal. ?                          The gastroesophageal flap valve was visualized  ?                          endoscopically and classified as Hill Grade III  ?                          (minimal fold, loose to endoscope, hiatal hernia  ?                          likely). ?                          The entire examined stomach was normal. Biopsies  ?                          were taken with a cold forceps for Helicobacter  ?                          pylori testing. Estimated blood loss was minimal. ?                          The examined duodenum was normal. ?Complications:            No immediate complications. ?Estimated Blood Loss:     Estimated blood loss was minimal. ?Impression:               - Normal esophagus. ?                          - Gastroesophageal flap  valve classified as Hill  ?                          Grade III (minimal fold, loose to endoscope, hiatal  ?                          hernia likely). ?                          - Normal stomach. Biopsied. ?                          - Normal examined duodenum. ?Recommendation:           - Patient has a contact number available for  ?                          emergencies. The signs and symptoms of potential  ?                          delayed complications were discussed with the  ?                          patient. Return to normal activities tomorrow.  ?                          Written discharge instructions were provided to the  ?                          patient. ?                          - Resume previous diet. ?                          - Continue present medications. ?                          - Await pathology results. ?Gerrit Heck, MD ?09/17/2021 9:28:57 AM ?

## 2021-09-19 ENCOUNTER — Telehealth: Payer: Self-pay

## 2021-09-19 ENCOUNTER — Telehealth: Payer: Self-pay | Admitting: *Deleted

## 2021-09-19 NOTE — Telephone Encounter (Signed)
No answer on second attempt follow up call.  ? ?

## 2021-09-19 NOTE — Telephone Encounter (Signed)
No answer, left message to call back later today, B.Ladawna Walgren RN. 

## 2021-09-21 ENCOUNTER — Encounter: Payer: Self-pay | Admitting: Gastroenterology

## 2021-10-22 ENCOUNTER — Encounter: Payer: Self-pay | Admitting: Hematology and Oncology

## 2022-01-14 ENCOUNTER — Inpatient Hospital Stay: Payer: Managed Care, Other (non HMO) | Attending: Hematology and Oncology | Admitting: Hematology and Oncology

## 2022-01-14 NOTE — Assessment & Plan Note (Deleted)
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%, T1c N0 stage Ib  Recommendation: 1.Neo-Adjuvant chemotherapy with dose dense Adriamycin and Cytoxan x4 followed by Taxol and carboplatinweekly x12 completed 10/01/2018 2.Right mastectomy: 11/06/2018: Pathologic complete response Followed by surveillance --------------------------------------------------------------------------------------------------------------------------------- Severe intractable Axillaryitching:She has tried everything to stop the itching.  Nothing has worked including gabapentin, Lyrica, Cymbalta, essential oils, topical lidocaine, Benadryl, steroids.  Acupuncture did not help either. I will refer her to interventional pain management to see if they can do anything to cut down the itching.  This is especially worse at night.  Breast cancer surveillance: 1.Breast exam 01/14/22: Bilateral mastectomies with scar from one end of the chest to the other 2.ultrasound left breast mastectomy scar: 1.2 cm hypoechoic scar: Fat necrosis 3. Bone Scan and CT CAP 09/22/20: Benign   Return to clinic in 1 year for follow-up

## 2022-06-04 ENCOUNTER — Telehealth: Payer: Self-pay | Admitting: Hematology and Oncology

## 2022-06-04 NOTE — Telephone Encounter (Signed)
Patient called to r/s scheduled missed appointment. Patient r/s

## 2022-06-05 ENCOUNTER — Inpatient Hospital Stay: Payer: Managed Care, Other (non HMO) | Attending: Hematology and Oncology

## 2022-06-05 ENCOUNTER — Other Ambulatory Visit: Payer: Self-pay | Admitting: *Deleted

## 2022-06-05 ENCOUNTER — Inpatient Hospital Stay: Payer: Managed Care, Other (non HMO) | Admitting: Hematology and Oncology

## 2022-06-05 ENCOUNTER — Other Ambulatory Visit: Payer: Self-pay

## 2022-06-05 VITALS — BP 127/80 | HR 88 | Temp 97.7°F | Resp 18 | Ht 67.0 in | Wt 174.2 lb

## 2022-06-05 DIAGNOSIS — R222 Localized swelling, mass and lump, trunk: Secondary | ICD-10-CM | POA: Diagnosis not present

## 2022-06-05 DIAGNOSIS — C50411 Malignant neoplasm of upper-outer quadrant of right female breast: Secondary | ICD-10-CM

## 2022-06-05 DIAGNOSIS — Z79899 Other long term (current) drug therapy: Secondary | ICD-10-CM | POA: Insufficient documentation

## 2022-06-05 DIAGNOSIS — Z7982 Long term (current) use of aspirin: Secondary | ICD-10-CM | POA: Insufficient documentation

## 2022-06-05 DIAGNOSIS — F32A Depression, unspecified: Secondary | ICD-10-CM | POA: Insufficient documentation

## 2022-06-05 DIAGNOSIS — Z9013 Acquired absence of bilateral breasts and nipples: Secondary | ICD-10-CM | POA: Insufficient documentation

## 2022-06-05 DIAGNOSIS — Z853 Personal history of malignant neoplasm of breast: Secondary | ICD-10-CM | POA: Insufficient documentation

## 2022-06-05 DIAGNOSIS — Z171 Estrogen receptor negative status [ER-]: Secondary | ICD-10-CM

## 2022-06-05 LAB — CMP (CANCER CENTER ONLY)
ALT: 17 U/L (ref 0–44)
AST: 20 U/L (ref 15–41)
Albumin: 4.1 g/dL (ref 3.5–5.0)
Alkaline Phosphatase: 69 U/L (ref 38–126)
Anion gap: 5 (ref 5–15)
BUN: 14 mg/dL (ref 6–20)
CO2: 31 mmol/L (ref 22–32)
Calcium: 9.6 mg/dL (ref 8.9–10.3)
Chloride: 103 mmol/L (ref 98–111)
Creatinine: 0.79 mg/dL (ref 0.44–1.00)
GFR, Estimated: >60 mL/min (ref >60)
Glucose, Bld: 100 mg/dL — ABNORMAL HIGH (ref 70–99)
Potassium: 3.7 mmol/L (ref 3.5–5.1)
Sodium: 139 mmol/L (ref 135–145)
Total Bilirubin: 0.7 mg/dL (ref 0.3–1.2)
Total Protein: 6.7 g/dL (ref 6.5–8.1)

## 2022-06-05 LAB — CBC WITH DIFFERENTIAL (CANCER CENTER ONLY)
Abs Immature Granulocytes: 0 10*3/uL (ref 0.00–0.07)
Basophils Absolute: 0 10*3/uL (ref 0.0–0.1)
Basophils Relative: 1 %
Eosinophils Absolute: 0.1 10*3/uL (ref 0.0–0.5)
Eosinophils Relative: 3 %
HCT: 42.3 % (ref 36.0–46.0)
Hemoglobin: 14.5 g/dL (ref 12.0–15.0)
Immature Granulocytes: 0 %
Lymphocytes Relative: 40 %
Lymphs Abs: 1.7 10*3/uL (ref 0.7–4.0)
MCH: 32.1 pg (ref 26.0–34.0)
MCHC: 34.3 g/dL (ref 30.0–36.0)
MCV: 93.6 fL (ref 80.0–100.0)
Monocytes Absolute: 0.2 10*3/uL (ref 0.1–1.0)
Monocytes Relative: 5 %
Neutro Abs: 2.3 10*3/uL (ref 1.7–7.7)
Neutrophils Relative %: 51 %
Platelet Count: 190 10*3/uL (ref 150–400)
RBC: 4.52 MIL/uL (ref 3.87–5.11)
RDW: 13.2 % (ref 11.5–15.5)
WBC Count: 4.4 10*3/uL (ref 4.0–10.5)
nRBC: 0 % (ref 0.0–0.2)

## 2022-06-05 NOTE — Progress Notes (Signed)
Patient Care Team: Madison Hickman, FNP as PCP - General (Family Medicine) Nicholas Lose, MD as Consulting Physician (Hematology and Oncology) Fanny Skates, MD as Consulting Physician (General Surgery)  DIAGNOSIS:  Encounter Diagnosis  Name Primary?   Malignant neoplasm of upper-outer quadrant of right breast in female, estrogen receptor negative ([place]) Yes    SUMMARY OF ONCOLOGIC HISTORY: Oncology History  Malignant neoplasm of upper-outer quadrant of right breast in female, estrogen receptor negative ([place])  06/22/2018 Initial Diagnosis   Screening mammogram detected right breast mass 1.1 cm at 10 o'clock position right breast 12 cm from the nipple, no abnormal lymph nodes, biopsy of the mass revealed IDC with DCIS grade 3, ER 0%, PR 0%, HER-2 -1+ by IHC, Ki-67 40%, T1c N0 stage Ib   06/29/2018 Cancer Staging   Staging form: Breast, AJCC 8th Edition - Clinical stage from 06/29/2018: Stage IB (cT1c, cN0, cM0, G3, ER-, PR-, HER2-) - Signed by Nicholas Lose, MD on 06/29/2018   07/09/2018 - 10/01/2018 Neo-Adjuvant Chemotherapy   Neo- Adjuvant chemotherapy with dose dense Adriamycin and Cytoxan x4 followed by Taxol and carboplatin weekly x5 (cycles 6-12 canceled due to extreme fatigue)   07/23/2018 Genetic Testing   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  A variant of uncertain significance (VUS) in a gene called APC was also noted. (c.7105C>T). Genes tested: AIP, ALK, APC*, ATM*, AXIN2, BAP1, BARD1, BLM, BMPR1A, BRCA1*, BRCA2*, BRIP1*, CDC73, CDH1*, CDK4, CDKN1B, CDKN2A, CHEK2*, CTNNA1, DICER1, FANCC, FH, FLCN, GALNT12, HOXB13, KIT, MAX, MEN1, MET, MLH1*, MRE11A, MSH2*, MSH6*, MUTYH*, NBN, NF1*, NF2, NTHL1, PALB2*, PDGFRA, PHOX2B, PMS2*, POLD1, POLE, POT1, PRKAR1A, PTCH1, PTEN*, RAD50, RAD51C*, RAD51D*, RB1, RET, SDHA, SDHAF2, SDHB, SDHC, SDHD, SMAD4, SMARCA4, SMARCB1, SMARCE1, STK11,  SUFU, TMEM127, TP53*, TSC1, TSC2, VHL and XRCC2 (sequencing and deletion/duplication); CASR, CFTR, CPA1, CTRC, EGFR, MITF, PRSS1 and SPINK1 (sequencing only); EPCAM and GREM1 (deletion/duplication only). DNA and RNA analyses performed for * genes. UPDATE: APC [id number] VUS has been reclassified to Likely Benign.  The reclassification date is 03/13/2020.   11/06/2018 Surgery   Left mastectomy: Complete response, 0/3 lymph nodes negative   11/06/2018 Cancer Staging   Staging form: Breast, AJCC 8th Edition - Pathologic stage from 11/06/2018: No Stage Recommended (ypT0, pN0, cM0) - Signed by Gardenia Phlegm, NP on 01/25/2019     CHIEF COMPLIANT: Complaining of a palpable nodule on the right chest wall  INTERVAL HISTORY: Holly Wiley is a 46 year old above-mentioned history of breast cancer status post bilateral mastectomies.  She came in urgently because she felt a lump in the right chest wall.  She denies any pain or discomfort. She continues to suffer from mental health issues with depression and it is because she lost both her mother and father.  She has been a caregiver for both of them for years and years.   ALLERGIES:  is allergic to bee venom, dihydroergotamine, dhea, imitrex [sumatriptan], metoclopramide, nitroglycerin, and transderm-scop [scopolamine].  MEDICATIONS:  Current Outpatient Medications  Medication Sig Dispense Refill   aspirin EC 81 MG tablet Take 1 tablet (81 mg total) by mouth daily. Swallow whole. 30 tablet 11   EPINEPHrine 0.3 mg/0.3 mL IJ SOAJ injection Inject 0.3 mg into the skin as needed for anaphylaxis.     gabapentin (NEURONTIN) 600 MG tablet Take 600 mg by mouth 3 (three) times daily.     Multiple Vitamin (MULTIVITAMIN WITH MINERALS) TABS tablet Take 1 tablet by  mouth daily.     ondansetron (ZOFRAN-ODT) 4 MG disintegrating tablet Take 4 mg by mouth every 8 (eight) hours as needed for nausea.     No current facility-administered medications for this visit.     PHYSICAL EXAMINATION: ECOG PERFORMANCE STATUS: 1 - Symptomatic but completely ambulatory  Vitals:   06/05/22 1422  BP: 127/80  Pulse: 88  Resp: 18  Temp: 97.7 F (36.5 C)  SpO2: 99%   Filed Weights   06/05/22 1422  Weight: 174 lb 3.2 oz (79 kg)    BREAST: Small palpable lump in the right chest wall (exam performed in the presence of a chaperone)  LABORATORY DATA:  I have reviewed the data as listed    Latest Ref Rng & Units 06/05/2022    1:36 PM 12/01/2018    5:08 AM 11/29/2018    4:35 AM  CMP  Glucose 70 - 99 mg/dL 100  104  110   BUN 6 - 20 mg/dL 14  <5  6   Creatinine 0.44 - 1.00 mg/dL 0.79  0.75  0.62   Sodium 135 - 145 mmol/L 139  138  138   Potassium 3.5 - 5.1 mmol/L 3.7  4.0  3.1   Chloride 98 - 111 mmol/L 103  100  103   CO2 22 - 32 mmol/L _0 Calcium 8.9 - 10.3 mg/dL 9.6  8.8  8.3   Total Protein 6.5 - 8.1 g/dL 6.7     Total Bilirubin 0.3 - 1.2 mg/dL 0.7     Alkaline Phos 38 - 126 U/L 69     AST 15 - 41 U/L 20     ALT 0 - 44 U/L 17       Lab Results  Component Value Date   WBC 4.4 06/05/2022   HGB 14.5 06/05/2022   HCT 42.3 06/05/2022   MCV 93.6 06/05/2022   PLT 190 06/05/2022   NEUTROABS 2.3 06/05/2022    ASSESSMENT & PLAN:  Malignant neoplasm of upper-outer quadrant of right breast in female, estrogen receptor negative ([place]) 06/22/2018: Screening mammogram detected right breast mass 1.1 cm at 10 o'clock position right breast 12 cm from the nipple, no abnormal lymph nodes, biopsy of the mass revealed IDC with DCIS grade 3, ER 0%, PR 0%, HER-2 -1+ by IHC, Ki-67 40%, T1c N0 stage Ib   Recommendation: 1.  Neo- Adjuvant chemotherapy with dose dense Adriamycin and Cytoxan x4 followed by Taxol and carboplatin weekly x12 completed 10/01/2018 2.  Right mastectomy: 11/06/2018: Pathologic complete response Followed by  surveillance --------------------------------------------------------------------------------------------------------------------------------- Severe intractable Axillary itching: She has tried everything to stop the itching.      Her mom passed away.  Her dad passed away 2 years ago.  She is still grieving the loss to the extent that she is unable to move forward.  She cannot sleep for more than 4 hours a day. She is seeking help with psychiatry and counselors.   Breast cancer surveillance: 1.  Breast exam 06/05/2022: Bilateral mastectomies with scar from one end of the chest to the other 2. ultrasound left breast mastectomy scar: 1.2 cm hypoechoic scar: Fat necrosis 3. Bone Scan and CT CAP 09/22/20: Benign      Palpable nodularity in the right chest wall: I suspect that this is a fat lobule.  We will obtain ultrasound of the right chest to evaluate this further.  Return to clinic in 1 year for follow-up   No orders of the  defined types were placed in this encounter.  The patient has a good understanding of the overall plan. she agrees with it. she will call with any problems that may develop before the next visit here. Total time spent: 30 mins including face to face time and time spent for planning, charting and co-ordination of care   Harriette Ohara, MD 06/05/22

## 2022-06-05 NOTE — Assessment & Plan Note (Signed)
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  Neo- Adjuvant chemotherapy with dose dense Adriamycin and Cytoxan x4 followed by Taxol and carboplatin weekly x12 completed 10/01/2018 2.  Right mastectomy: 11/06/2018: Pathologic complete response Followed by surveillance --------------------------------------------------------------------------------------------------------------------------------- Severe intractable Axillary itching: She has tried everything to stop the itching.  Nothing has worked including gabapentin, Lyrica, Cymbalta, essential oils, topical lidocaine, Benadryl, steroids.  Acupuncture did not help either. I will refer her to interventional pain management to see if they can do anything to cut down the itching.  This is especially worse at night.   Stress: Helps her mom.   Breast cancer surveillance: 1.  Breast exam 06/05/2022: Bilateral mastectomies with scar from one end of the chest to the other 2. ultrasound left breast mastectomy scar: 1.2 cm hypoechoic scar: Fat necrosis 3. Bone Scan and CT CAP 09/22/20: Benign      Return to clinic in 1 year for follow-up

## 2022-06-10 ENCOUNTER — Ambulatory Visit
Admission: RE | Admit: 2022-06-10 | Discharge: 2022-06-10 | Disposition: A | Discharge status: Home / Self Care | Payer: Managed Care, Other (non HMO) | Source: Ambulatory Visit | Attending: Hematology and Oncology | Admitting: Hematology and Oncology

## 2022-06-10 ENCOUNTER — Other Ambulatory Visit: Payer: Self-pay

## 2022-06-10 ENCOUNTER — Encounter: Payer: Self-pay | Admitting: Hematology and Oncology

## 2022-06-10 DIAGNOSIS — N6310 Unspecified lump in the right breast, unspecified quadrant: Secondary | ICD-10-CM

## 2022-06-10 DIAGNOSIS — Z171 Estrogen receptor negative status [ER-]: Secondary | ICD-10-CM

## 2022-06-13 ENCOUNTER — Other Ambulatory Visit: Payer: Managed Care, Other (non HMO)

## 2022-06-13 ENCOUNTER — Ambulatory Visit: Payer: Managed Care, Other (non HMO) | Admitting: Hematology and Oncology

## 2022-06-14 ENCOUNTER — Telehealth: Payer: Managed Care, Other (non HMO) | Admitting: Hematology and Oncology

## 2022-06-17 ENCOUNTER — Ambulatory Visit
Admission: RE | Admit: 2022-06-17 | Discharge: 2022-06-17 | Disposition: A | Discharge status: Home / Self Care | Payer: Managed Care, Other (non HMO) | Source: Ambulatory Visit | Attending: Hematology and Oncology

## 2022-06-17 ENCOUNTER — Other Ambulatory Visit: Payer: Self-pay | Admitting: Hematology and Oncology

## 2022-06-17 DIAGNOSIS — N6310 Unspecified lump in the right breast, unspecified quadrant: Secondary | ICD-10-CM

## 2022-06-17 MED ORDER — GADOPICLENOL 0.5 MMOL/ML IV SOLN
7.5000 mL | Freq: Once | INTRAVENOUS | Status: AC | PRN
  Administered 2022-06-17: 7.5 mL via INTRAVENOUS

## 2022-06-18 ENCOUNTER — Other Ambulatory Visit: Payer: Self-pay | Admitting: Hematology and Oncology

## 2022-06-18 NOTE — Progress Notes (Signed)
HEMATOLOGY-ONCOLOGY TELEPHONE VISIT PROGRESS NOTE  I connected with our patient on 06/19/22 at 12:00 PM EST by telephone and verified that I am speaking with the correct person using two identifiers.  I discussed the limitations, risks, security and privacy concerns of performing an evaluation and management service by telephone and the availability of in person appointments.  I also discussed with the patient that there may be a patient responsible charge related to this service. The patient expressed understanding and agreed to proceed.   History of Present Illness: Holly Wiley is a 46-year-old above-mentioned history of breast cancer status post bilateral mastectomies.She presents to the clinic for a telephone follow-up.   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  A variant of uncertain significance (VUS) in a gene called APC was also noted. (c.7105C>T). Genes tested: AIP, ALK, APC*, ATM*, AXIN2, BAP1, BARD1, BLM, BMPR1A, BRCA1*, BRCA2*, BRIP1*, CDC73, CDH1*,  CDK4, CDKN1B, CDKN2A, CHEK2*, CTNNA1, DICER1, FANCC, FH, FLCN, GALNT12, HOXB13, KIT, MAX, MEN1, MET, MLH1*, MRE11A, MSH2*, MSH6*, MUTYH*, NBN, NF1*, NF2, NTHL1, PALB2*, PDGFRA, PHOX2B, PMS2*, POLD1, POLE, POT1, PRKAR1A, PTCH1, PTEN*, RAD50, RAD51C*, RAD51D*, RB1, RET, SDHA, SDHAF2, SDHB, SDHC, SDHD, SMAD4, SMARCA4, SMARCB1, SMARCE1, STK11, SUFU, TMEM127, TP53*, TSC1, TSC2, VHL and XRCC2 (sequencing and deletion/duplication); CASR, CFTR, CPA1, CTRC, EGFR, MITF, PRSS1 and SPINK1 (sequencing only); EPCAM and GREM1 (deletion/duplication only). DNA and RNA analyses performed for * genes. UPDATE: APC p.P2369S VUS has been reclassified to Likely Benign.  The reclassification date is 03/13/2020.   11/06/2018 Surgery   Left mastectomy: Complete response, 0/3 lymph nodes negative   11/06/2018 Cancer Staging   Staging form: Breast, AJCC 8th Edition - Pathologic stage from 11/06/2018: No Stage Recommended (ypT0, pN0, cM0) - Signed by Causey, Lindsey Cornetto, NP on 01/25/2019     REVIEW OF SYSTEMS:   Constitutional: Denies fevers, chills or abnormal weight loss All other systems were reviewed with the patient and are negative. 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  Neo- Adjuvant chemotherapy with dose dense Adriamycin and Cytoxan x4 followed by Taxol and carboplatin weekly x12 completed 10/01/2018 2.  Right mastectomy: 11/06/2018: Pathologic complete response Followed by surveillance --------------------------------------------------------------------------------------------------------------------------------- Severe intractable  Axillary itching: She   has tried everything to stop the itching.      Her mom passed away.  Her dad passed away 2 years ago.  She is still grieving the loss to the extent that she is unable to move forward.  She cannot sleep for more than 4 hours a day. She is seeking help with psychiatry and counselors.   Breast cancer surveillance: 1.  Breast exam 06/05/2022: Bilateral mastectomies with scar from one end of the chest to the other 2. ultrasound left breast mastectomy scar: 1.2 cm hypoechoic scar: Fat necrosis 3. Bone Scan and CT CAP 09/22/20: Benign   4.  Breast MRI 06/17/2022: No MRI evidence of malignancy.  6-month ultrasound follow-up of the right breast  Return to clinic in 1 year for follow-up    I discussed the assessment and treatment plan with the patient. The patient was provided an opportunity to ask questions and all were answered. The patient agreed with the plan and demonstrated an understanding of the instructions. The patient was advised to call back or seek an in-person evaluation if the symptoms worsen or if the condition fails to improve as anticipated.   I provided 12 minutes of non-face-to-face time during this encounter.  This includes time for charting and coordination of care   Viinay K Gudena, MD  I ,  am acting as a scribe for Dr.Vinay Gudena  I have reviewed the above documentation for accuracy and completeness, and I agree with the above.   

## 2022-06-19 ENCOUNTER — Encounter: Payer: Self-pay | Admitting: Hematology and Oncology

## 2022-06-19 ENCOUNTER — Inpatient Hospital Stay (HOSPITAL_BASED_OUTPATIENT_CLINIC_OR_DEPARTMENT_OTHER): Payer: Managed Care, Other (non HMO) | Admitting: Hematology and Oncology

## 2022-06-19 DIAGNOSIS — G4700 Insomnia, unspecified: Secondary | ICD-10-CM

## 2022-06-19 DIAGNOSIS — C50411 Malignant neoplasm of upper-outer quadrant of right female breast: Secondary | ICD-10-CM | POA: Diagnosis not present

## 2022-06-19 DIAGNOSIS — Z171 Estrogen receptor negative status [ER-]: Secondary | ICD-10-CM

## 2022-06-19 HISTORY — DX: Insomnia, unspecified: G47.00

## 2022-06-19 NOTE — Assessment & Plan Note (Signed)
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  Neo- Adjuvant chemotherapy with dose dense Adriamycin and Cytoxan x4 followed by Taxol and carboplatin weekly x12 completed 10/01/2018 2.  Right mastectomy: 11/06/2018: Pathologic complete response Followed by surveillance --------------------------------------------------------------------------------------------------------------------------------- Severe intractable Axillary itching: She has tried everything to stop the itching.      Her mom passed away.  Her dad passed away 2 years ago.  She is still grieving the loss to the extent that she is unable to move forward.  She cannot sleep for more than 4 hours a day. She is seeking help with psychiatry and counselors.   Breast cancer surveillance: 1.  Breast exam 06/05/2022: Bilateral mastectomies with scar from one end of the chest to the other 2. ultrasound left breast mastectomy scar: 1.2 cm hypoechoic scar: Fat necrosis 3. Bone Scan and CT CAP 09/22/20: Benign   4.  Breast MRI 06/17/2022: No MRI evidence of malignancy.  30-monthultrasound follow-up of the right breast  Return to clinic in 1 year for follow-up

## 2022-07-03 ENCOUNTER — Ambulatory Visit: Payer: Managed Care, Other (non HMO) | Admitting: Cardiology

## 2022-07-05 ENCOUNTER — Other Ambulatory Visit: Payer: Managed Care, Other (non HMO)

## 2022-07-12 ENCOUNTER — Ambulatory Visit: Payer: 59 | Admitting: Psychiatry

## 2022-07-26 LAB — HM PAP SMEAR: HM Pap smear: NORMAL

## 2022-08-08 NOTE — Progress Notes (Addendum)
Cardiology Office Note:    Date:  08/09/2022   ID:  Holly Wiley, DOB 05/25/1976, MRN [id number]  PCP:  Holly Hickman, FNP  Cardiologist:  Holly More, MD    Referring MD: Holly Hickman, FNP    ASSESSMENT:    1. Palpitations   2. Pericardial effusion    PLAN:    In order of problems listed above:  Predominant problem is palpitation seems to be symptomatic sinus tachycardia associated sleep disturbance and grief.  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  She will be reaching out to [facility] behavioral health for counseling Week I do not think she requires repeat echocardiogram this year   Next appointment: 1 year   Medication Adjustments/Labs and Tests Ordered: Current medicines are reviewed at length with the patient today.  Concerns regarding medicines are outlined above.  No orders of the defined types were placed in this encounter.  No orders of the defined types were placed in this encounter.   Chief Complaint  Patient presents with   Pericardial Effusion    History of Present Illness:    Holly Wiley is a 47 y.o. female with a hx of right breast cancer  s/p neoadjuvant chemotherapy and bilateral mastectomy 2020 last seen 08/31/2021.She has had surgery bilateral mastectomy and chemotherapy including Adriamycin but no radiation therapy  09/07/2021:  1. Left ventricular ejection fraction, by estimation, is 60 to 65%. The  left ventricle has normal function. The left ventricle has no regional  wall motion abnormalities. Left ventricular diastolic parameters were  normal. The average left ventricular  global longitudinal strain is -15.9 %.   2. Right ventricular systolic function is normal. The right ventricular  size is normal.   3. A trivial to small pericardial effusion is  present. The pericardial  effusion is circumferential.   4. The mitral valve is normal in structure. No evidence of mitral valve  regurgitation. No evidence of mitral stenosis.   5. The aortic valve is tricuspid. Aortic valve regurgitation is not  visualized. No aortic stenosis is present.   6. The inferior vena cava is normal in size with greater than 50%  respiratory variability, suggesting right atrial pressure of 3 mmHg.   Compliance with diet, lifestyle and medications: Yes  She is struggling after the death of both parents in the last year in particular has trouble sleeping at night In the evening she is aware of her heart with a rate in the range of 120 bpm she tells me she wore an event monitor last fall which showed sinus tachycardia I try to look online but I cannot hold the report it was done no other office I will request a copy And going to give her a small dose of selective beta-blocker at bedtime to try and the symptoms alleviate She has no exercise intolerance edema shortness of breath chest pain  Her EKG in the office is normal I will think she requires a repeat echocardiogram next year Past Medical History:  Diagnosis Date   Breast cancer ([place]) 07/2018   right   Dental crown present    Family history of bladder cancer    Family history of breast cancer    Family history of colon cancer    Family history of pancreatic cancer  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   Migraines    PONV (postoperative nausea and vomiting)     Past Surgical History:  Procedure Laterality Date   BILATERAL TOTAL MASTECTOMY WITH AXILLARY LYMPH NODE DISSECTION N/A 11/06/2018   Procedure: BILATERAL TOTAL MASTECTOMY WITH AXILLARY LYMPH NODE DISSECTION;  Surgeon: Holly Skates, MD;  Location: [place];  Service: General;  Laterality: N/A;   BREAST BIOPSY Right 11/08/2013   Procedure: EXCISION OF RIGHT  BREAST MASS;  Surgeon: Holly Hector, MD;   Location: [place];  Service: General;  Laterality: Right;   BREAST RECONSTRUCTION WITH MASTOPLASTY Bilateral 08/03/2019   Procedure: Revision bilateral mastectomy flaps;  Surgeon: Holly Limbo, MD;  Location: [place];  Service: Plastics;  Laterality: Bilateral;   BUNIONECTOMY Left    CARPAL TUNNEL RELEASE Right 02/26/2017   CERVICAL CONE BIOPSY  05/2005   COLONOSCOPY  2014   DRESSING CHANGE UNDER ANESTHESIA Right 11/29/2018   Procedure: DRESSING CHANGE UNDER ANESTHESIA;  Surgeon: Holly Skates, MD;  Location: [place];  Service: General;  Laterality: Right;   IRRIGATION AND DEBRIDEMENT ABSCESS Right 11/28/2018   Procedure: Exploration and Drainage of Right Mastectomy wound.;  Surgeon: Holly Skates, MD;  Location: [place];  Service: General;  Laterality: Right;   PORT-A-CATH REMOVAL N/A 11/06/2018   Procedure: Removal Port-A-Cath;  Surgeon: Holly Skates, MD;  Location: [place];  Service: General;  Laterality: N/A;   PORTACATH PLACEMENT Right 07/08/2018   Procedure: INSERTION PORT-A-CATH;  Surgeon: Holly Skates, MD;  Location: [place];  Service: General;  Laterality: Right;   SHOULDER ARTHROSCOPY W/ LABRAL REPAIR Right    SHOULDER ARTHROSCOPY W/ ROTATOR CUFF REPAIR Left    TENOTOMY FOREARM / WRIST Right 02/26/2017    Current Medications: Current Meds  Medication Sig   aspirin EC 81 MG tablet Take 1 tablet (81 mg total) by mouth daily. Swallow whole.   EPINEPHrine 0.3 mg/0.3 mL IJ SOAJ injection Inject 0.3 mg into the skin as needed for anaphylaxis.   escitalopram (LEXAPRO) 10 MG tablet Take 5 mg by mouth daily.   eszopiclone (LUNESTA) 1 MG TABS tablet Take 1 mg by mouth at bedtime as needed for sleep.   Multiple Vitamin (MULTIVITAMIN WITH MINERALS) TABS tablet Take 1 tablet by mouth daily.   ondansetron (ZOFRAN-ODT) 4 MG disintegrating tablet Take 4 mg by mouth every 8 (eight) hours as needed for nausea.     Allergies:   Bee venom,  Dihydroergotamine, Dhea, Imitrex [sumatriptan], Metoclopramide, Nitroglycerin, and Transderm-scop [scopolamine]   Social History   Socioeconomic History   Marital status: Divorced    Spouse name: Not on file   Number of children: Not on file   Years of education: Not on file   Highest education level: Not on file  Occupational History   Occupation: Hospice nurse  Tobacco Use   Smoking status: Never    Passive exposure: Never   Smokeless tobacco: Never  Vaping Use   Vaping Use: Never used  Substance and Sexual Activity   Alcohol use: Not Currently    Comment: rare   Drug use: No   Sexual activity: Not Currently    Birth control/protection: Abstinence  Other Topics Concern   Not on file  Social History Narrative   Not on file   Social Determinants of Health   Financial Resource Strain: Not on file  Food Insecurity: Not on file  Transportation Needs: Not on file  Physical Activity:  Not on file  Stress: Not on file  Social Connections: Not on file     Family History: The patient's family history includes Bladder Cancer (age of onset: 37) in her maternal aunt; Breast cancer in an other family member; Breast cancer (age of onset: 73) in her mother; Colon cancer in an other family member; Colon cancer (age of onset: 74) in an other family member; Colon cancer (age of onset: 27) in her maternal uncle; Diabetes in her maternal aunt, maternal grandmother, and maternal uncle; Pancreatic cancer in an other family member; Stroke in her paternal grandfather. There is no history of Esophageal cancer or Rectal cancer. ROS:   Please see the history of present illness.    All other systems reviewed and are negative.  EKGs/Labs/Other Studies Reviewed:    The following studies were reviewed today:  EKG:  EKG ordered today and personally reviewed.  The ekg ordered today demonstrates sinus rhythm and is normal there is no low voltage  Recent Labs: 06/05/2022: ALT 17; BUN 14; Creatinine  0.79; Hemoglobin 14.5; Platelet Count 190; Potassium 3.7; Sodium 139  Recent Lipid Panel No results found for: "CHOL", "TRIG", "HDL", "CHOLHDL", "VLDL", "LDLCALC", "LDLDIRECT"  Physical Exam:    VS:  BP 132/84 (BP Location: Right Arm, Patient Position: Sitting)   Pulse 88   Ht 5' 7"$  (1.702 m)   Wt 183 lb 3.2 oz (83.1 kg)   SpO2 99%   BMI 28.69 kg/m     Wt Readings from Last 3 Encounters:  08/09/22 183 lb 3.2 oz (83.1 kg)  06/05/22 174 lb 3.2 oz (79 kg)  09/17/21 149 lb (67.6 kg)     GEN:  Well nourished, well developed in no acute distress HEENT: Normal NECK: No JVD; No carotid bruits LYMPHATICS: No lymphadenopathy CARDIAC: RRR, no murmurs, rubs, gallops RESPIRATORY:  Clear to auscultation without rales, wheezing or rhonchi  ABDOMEN: Soft, non-tender, non-distended MUSCULOSKELETAL:  No edema; No deformity  SKIN: Warm and dry NEUROLOGIC:  Alert and oriented x 3 PSYCHIATRIC:  Normal affect   Seen with [name], RN chaperone   Signed, Holly More, MD  08/09/2022 8:16 AM    [place]

## 2022-08-09 ENCOUNTER — Ambulatory Visit: Payer: Managed Care, Other (non HMO) | Attending: Cardiology | Admitting: Cardiology

## 2022-08-09 ENCOUNTER — Encounter: Payer: Self-pay | Admitting: Cardiology

## 2022-08-09 VITALS — BP 132/84 | HR 88 | Ht 67.0 in | Wt 183.2 lb

## 2022-08-09 DIAGNOSIS — I3139 Other pericardial effusion (noninflammatory): Secondary | ICD-10-CM

## 2022-08-09 DIAGNOSIS — R002 Palpitations: Secondary | ICD-10-CM

## 2022-08-09 MED ORDER — ACEBUTOLOL HCL 200 MG PO CAPS: 200.0000 mg | ORAL_CAPSULE | Freq: Every day | ORAL | 3 refills | Status: DC

## 2022-08-09 NOTE — Patient Instructions (Addendum)
Medication Instructions:  Your physician has recommended you make the following change in your medication:   START:Sectral 200 mg daily  *If you need a refill on your cardiac medications before your next appointment, please call your pharmacy*   Lab Work: None If you have labs (blood work) drawn today and your tests are completely normal, you will receive your results only by: [place] (if you have MyChart) OR A paper copy in the mail If you have any lab test that is abnormal or we need to change your treatment, we will call you to review the results.   Testing/Procedures: None   Follow-Up: At [facility], you and your health needs are our priority.  As part of our continuing mission to provide you with exceptional heart care, we have created designated Provider Care Teams.  These Care Teams include your primary Cardiologist (physician) and Advanced Practice Providers (APPs -  Physician Assistants and Nurse Practitioners) who all work together to provide you with the care you need, when you need it.  We recommend signing up for the patient portal called "MyChart".  Sign up information is provided on this After Visit Summary.  MyChart is used to connect with patients for Virtual Visits (Telemedicine).  Patients are able to view lab/test results, encounter notes, upcoming appointments, etc.  Non-urgent messages can be sent to your provider as well.   To learn more about what you can do with MyChart, go to [email or web address].    Your next appointment:   1 year(s)  Provider:   Shirlee More, MD    Other Instructions None  This visit was accompanied by [name].

## 2022-08-14 ENCOUNTER — Other Ambulatory Visit: Payer: Self-pay

## 2022-08-14 ENCOUNTER — Encounter: Payer: Self-pay | Admitting: Cardiology

## 2022-08-14 ENCOUNTER — Telehealth: Payer: Self-pay

## 2022-08-14 DIAGNOSIS — I3139 Other pericardial effusion (noninflammatory): Secondary | ICD-10-CM

## 2022-08-14 NOTE — Telephone Encounter (Signed)
Attempted to call patient to inform her that Dr. Bettina Gavia would like her to have an echo. Patient did not answer the phone. Left voice mail for the patient to call back

## 2022-08-30 ENCOUNTER — Ambulatory Visit: Payer: Managed Care, Other (non HMO) | Attending: Cardiology

## 2022-08-30 DIAGNOSIS — I5030 Unspecified diastolic (congestive) heart failure: Secondary | ICD-10-CM | POA: Diagnosis not present

## 2022-08-30 DIAGNOSIS — I3139 Other pericardial effusion (noninflammatory): Secondary | ICD-10-CM | POA: Diagnosis not present

## 2022-08-30 DIAGNOSIS — I361 Nonrheumatic tricuspid (valve) insufficiency: Secondary | ICD-10-CM

## 2022-08-30 LAB — ECHOCARDIOGRAM COMPLETE
Area-P 1/2: 3.6 cm2
S' Lateral: 3 cm

## 2022-12-20 ENCOUNTER — Other Ambulatory Visit: Payer: Managed Care, Other (non HMO)

## 2023-03-17 ENCOUNTER — Encounter: Payer: Self-pay | Admitting: Hematology and Oncology

## 2023-04-04 ENCOUNTER — Ambulatory Visit
Admission: RE | Admit: 2023-04-04 | Discharge: 2023-04-04 | Disposition: A | Discharge status: Home / Self Care | Payer: Managed Care, Other (non HMO) | Source: Ambulatory Visit | Attending: Hematology and Oncology

## 2023-04-04 DIAGNOSIS — N6310 Unspecified lump in the right breast, unspecified quadrant: Secondary | ICD-10-CM

## 2023-04-07 ENCOUNTER — Other Ambulatory Visit: Payer: Self-pay | Admitting: *Deleted

## 2023-04-07 DIAGNOSIS — Z171 Estrogen receptor negative status [ER-]: Secondary | ICD-10-CM

## 2023-04-07 DIAGNOSIS — Z9189 Other specified personal risk factors, not elsewhere classified: Secondary | ICD-10-CM

## 2023-06-18 ENCOUNTER — Telehealth: Payer: Self-pay | Admitting: *Deleted

## 2023-06-18 NOTE — Telephone Encounter (Signed)
   Name: Holly Wiley  DOB: 05/21/1976  MRN: [id number]  Primary Cardiologist: None   Preoperative team, please contact this patient and set up a phone call appointment for further preoperative risk assessment. Please obtain consent and complete medication review. Thank you for your help.  I confirm that guidance regarding antiplatelet and oral anticoagulation therapy has been completed and, if necessary, noted below.  Patient's aspirin is not prescribed by cardiology.  Recommendations for holding aspirin will need to come from prescribing provider.  I also confirmed the patient resides in the state of [place]. As per [facility] Medical Board telemedicine laws, the patient must reside in the state in which the provider is licensed.   Ronney Asters, NP 06/18/2023, 8:42 AM [facility]

## 2023-06-18 NOTE — Telephone Encounter (Signed)
   Pre-operative Risk Assessment    Patient Name: Holly Wiley  DOB: [birth year] MRN: [id number]  DATE OF LAST VISIT: 08/09/22 DR. MUNLEY DATE OF NEXT VISIT: NONE    Request for Surgical Clearance    Procedure:   LEFT REVERSE SHOULDER ARTHROPLASTY  Date of Surgery:  Clearance TBD                                 Surgeon:  DR. Caryn Bee SUPPLE Surgeon's Group or Practice Name:  [facility] Phone number:  [phone] [facility] Fax number:  [phone]   Type of Clearance Requested:   - Medical  - Pharmacy:  Hold Aspirin     Type of Anesthesia:   CHOICE   Additional requests/questions:    Elpidio Anis   06/18/2023, 8:35 AM

## 2023-06-18 NOTE — Telephone Encounter (Signed)
Patient states that surgery want be until March 2025. Pt would prefer to be see in office. Pt scheduled to see Dr. Dulce Sellar on 08/08/23.

## 2023-06-19 ENCOUNTER — Telehealth: Payer: Self-pay

## 2023-06-19 NOTE — Telephone Encounter (Signed)
Attempted to call pt to make aware we are cancelling MRI d/t insurance denial. When discussed with MD he states, "we can cancel it. Previously she had sonographic changes but in Oct its neg." LVM for pt to call back so we can make her aware of this change.

## 2023-06-20 ENCOUNTER — Other Ambulatory Visit: Payer: Managed Care, Other (non HMO)

## 2023-06-20 ENCOUNTER — Telehealth: Payer: Self-pay

## 2023-06-20 DIAGNOSIS — Z171 Estrogen receptor negative status [ER-]: Secondary | ICD-10-CM

## 2023-06-20 DIAGNOSIS — Z9189 Other specified personal risk factors, not elsewhere classified: Secondary | ICD-10-CM

## 2023-06-20 NOTE — Telephone Encounter (Signed)
S/w pt this morning regarding MRI. Explained in detail reasoning for insurance not approving MRI, despite recommendation by ACS for pt's with TC score of >20% (pt's score is 25%).  She verbalized frustration stating insurance should not be able to dictate her cancer prevention care. Concerns validated by this nurse and advised we have a few options for pt. We could order FAST MRI and she would have to pay OOP between $200-$300, or we could order Guardant Reveal to test for residual disease. She is inclined to accept offer for both. She wants to move forward with ordering FAST MRI and will call to schedule that for January since her insurance will not be affected by this. And we will send for Guardant Reveal as well.  She will call us back to let us know when she is scheduled for MRI so we can schedule MD f/u.

## 2023-07-03 ENCOUNTER — Telehealth: Payer: Self-pay | Admitting: Hematology and Oncology

## 2023-07-03 NOTE — Telephone Encounter (Signed)
 Marland Kitchen

## 2023-07-04 ENCOUNTER — Ambulatory Visit
Admission: RE | Admit: 2023-07-04 | Discharge: 2023-07-04 | Disposition: A | Discharge status: Home / Self Care | Payer: No Typology Code available for payment source | Source: Ambulatory Visit | Attending: Hematology and Oncology | Admitting: Hematology and Oncology

## 2023-07-04 DIAGNOSIS — C50411 Malignant neoplasm of upper-outer quadrant of right female breast: Secondary | ICD-10-CM

## 2023-07-04 DIAGNOSIS — Z9189 Other specified personal risk factors, not elsewhere classified: Secondary | ICD-10-CM

## 2023-07-04 MED ORDER — GADOPICLENOL 0.5 MMOL/ML IV SOLN
8.0000 mL | Freq: Once | INTRAVENOUS | Status: AC | PRN
  Administered 2023-07-04: 8 mL via INTRAVENOUS

## 2023-07-07 ENCOUNTER — Telehealth: Payer: Self-pay

## 2023-07-07 NOTE — Telephone Encounter (Addendum)
 Called pt with message below. Pt verbalized understanding.----- Message from Morna JAYSON Kendall sent at 07/04/2023  2:48 PM EST ----- MRI negative, please let patient know ----- Message ----- From: Interface, Rad Results In Sent: 07/04/2023  11:33 AM EST To: Mackey Chad, MD

## 2023-07-11 ENCOUNTER — Other Ambulatory Visit: Payer: Self-pay | Admitting: *Deleted

## 2023-07-11 DIAGNOSIS — C50411 Malignant neoplasm of upper-outer quadrant of right female breast: Secondary | ICD-10-CM

## 2023-07-14 ENCOUNTER — Inpatient Hospital Stay: Payer: Self-pay

## 2023-07-14 ENCOUNTER — Inpatient Hospital Stay: Payer: Self-pay | Admitting: Hematology and Oncology

## 2023-07-14 NOTE — Assessment & Plan Note (Deleted)
 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  Neo- Adjuvant chemotherapy with dose dense Adriamycin  and Cytoxan  x4 followed by Taxol  and carboplatin  weekly x12 completed 10/01/2018 2. bilateral mastectomies right mastectomy: 11/06/2018: Pathologic complete response Followed by surveillance --------------------------------------------------------------------------------------------------------------------------------- Severe intractable Axillary itching: She has tried everything to stop the itching.      Her mom passed away.  Her dad passed away 2 years ago.  She is still grieving the loss to the extent that she is unable to move forward.  She cannot sleep for more than 4 hours a day. She is seeking help with psychiatry and counselors.   Breast cancer surveillance: 1.  Breast exam 07/14/2023: Bilateral mastectomies with scar from one end of the chest to the other 2. breast MRI 07/04/2023: No evidence of malignancy 4.  Breast MRI 06/17/2022: No MRI evidence of malignancy.  Consider annual breast MRIs   Return to clinic in 1 year for follow-up

## 2023-07-17 ENCOUNTER — Other Ambulatory Visit: Payer: Managed Care, Other (non HMO)

## 2023-07-17 ENCOUNTER — Ambulatory Visit: Payer: Managed Care, Other (non HMO) | Admitting: Hematology and Oncology

## 2023-08-04 ENCOUNTER — Inpatient Hospital Stay: Payer: 59 | Attending: Hematology and Oncology

## 2023-08-04 DIAGNOSIS — M25519 Pain in unspecified shoulder: Secondary | ICD-10-CM | POA: Diagnosis not present

## 2023-08-04 DIAGNOSIS — C50411 Malignant neoplasm of upper-outer quadrant of right female breast: Secondary | ICD-10-CM

## 2023-08-04 DIAGNOSIS — Z9013 Acquired absence of bilateral breasts and nipples: Secondary | ICD-10-CM | POA: Diagnosis not present

## 2023-08-04 DIAGNOSIS — G8929 Other chronic pain: Secondary | ICD-10-CM | POA: Diagnosis not present

## 2023-08-04 DIAGNOSIS — Z853 Personal history of malignant neoplasm of breast: Secondary | ICD-10-CM | POA: Diagnosis present

## 2023-08-04 LAB — CBC WITH DIFFERENTIAL (CANCER CENTER ONLY)
Abs Immature Granulocytes: 0.01 10*3/uL (ref 0.00–0.07)
Basophils Absolute: 0.1 10*3/uL (ref 0.0–0.1)
Basophils Relative: 1 %
Eosinophils Absolute: 0.1 10*3/uL (ref 0.0–0.5)
Eosinophils Relative: 2 %
HCT: 42.3 % (ref 36.0–46.0)
Hemoglobin: 14.8 g/dL (ref 12.0–15.0)
Immature Granulocytes: 0 %
Lymphocytes Relative: 33 %
Lymphs Abs: 2.1 10*3/uL (ref 0.7–4.0)
MCH: 31.2 pg (ref 26.0–34.0)
MCHC: 35 g/dL (ref 30.0–36.0)
MCV: 89.2 fL (ref 80.0–100.0)
Monocytes Absolute: 0.3 10*3/uL (ref 0.1–1.0)
Monocytes Relative: 5 %
Neutro Abs: 3.6 10*3/uL (ref 1.7–7.7)
Neutrophils Relative %: 59 %
Platelet Count: 224 10*3/uL (ref 150–400)
RBC: 4.74 MIL/uL (ref 3.87–5.11)
RDW: 13 % (ref 11.5–15.5)
WBC Count: 6.1 10*3/uL (ref 4.0–10.5)
nRBC: 0 % (ref 0.0–0.2)
nRBC: 0 /100{WBCs}

## 2023-08-04 LAB — CMP (CANCER CENTER ONLY)
ALT: 13 U/L (ref 0–44)
AST: 18 U/L (ref 15–41)
Albumin: 4.5 g/dL (ref 3.5–5.0)
Alkaline Phosphatase: 73 U/L (ref 38–126)
Anion gap: 11 (ref 5–15)
BUN: 14 mg/dL (ref 6–20)
CO2: 28 mmol/L (ref 22–32)
Calcium: 9.7 mg/dL (ref 8.9–10.3)
Chloride: 101 mmol/L (ref 98–111)
Creatinine: 0.83 mg/dL (ref 0.44–1.00)
GFR, Estimated: >60 mL/min (ref >60)
Glucose, Bld: 91 mg/dL (ref 70–99)
Potassium: 3.8 mmol/L (ref 3.5–5.1)
Sodium: 140 mmol/L (ref 135–145)
Total Bilirubin: 0.3 mg/dL (ref 0.0–1.2)
Total Protein: 7 g/dL (ref 6.5–8.1)

## 2023-08-06 ENCOUNTER — Other Ambulatory Visit: Payer: Self-pay

## 2023-08-06 ENCOUNTER — Telehealth: Payer: Self-pay | Admitting: Hematology and Oncology

## 2023-08-07 ENCOUNTER — Encounter: Payer: Self-pay | Admitting: Cardiology

## 2023-08-07 NOTE — Progress Notes (Signed)
 Cardiology Office Note:    Date:  08/08/2023   ID:  Holly Wiley, DOB Feb 09, 1976, MRN [id number]  PCP:  Carlon Mitzie SAUNDERS, FNP  Cardiologist:  Redell Leiter, MD    Referring MD: Carlon Mitzie SAUNDERS, FNP    ASSESSMENT:    1. Preop cardiovascular exam   2. Pericardial effusion   3. Palpitations    PLAN:    In order of problems listed above:  Her surgical procedure is intermediate risk elective her cardiac problems include palpitation related to sinus tachycardia and a very small insignificant pericardial effusion. She continues to do well we will continue her current beta-blocker She is optimized for planned surgical procedure as an outpatient I am going to redo her prescription so if she needs to she can take a second tablet of short acting beta-blocker as needed Otherwise has a structurally normal heart   Next appointment: Plan to see her back in 1 year   Medication Adjustments/Labs and Tests Ordered: Current medicines are reviewed at length with the patient today.  Concerns regarding medicines are outlined above.  Orders Placed This Encounter  Procedures   EKG 12-Lead   No orders of the defined types were placed in this encounter.    History of Present Illness:    Holly Wiley is a 48 y.o. female with a hx of breast cancer with bilateral mastectomy and neoadjuvant chemotherapy including Adriamycin  was had a small to trivial pericardial effusion felt to be clinically insignificant last seen 08/09/2022.  He is seen today in preoperative evaluation with pending a left reverse shoulder arthroplasty in March Dr. Franky Pointer.  Compliance with diet, lifestyle and medications: Yes  She has struggled with severe shoulder pain loss of function and is scheduled for surgical intervention in March For a cardiology perspective doing well she takes a low-dose beta-blocker for tachycardia and recently had worsened symptoms associated with Pristiq she stopped it And redo her  prescription is twice daily and allow her to decide if she needs to take the second tablet No edema neck vein distention shortness of breath syncope or chest pain With recheck her echocardiogram but it has no bearing on her surgical clearance. Past Medical History:  Diagnosis Date   Acquired absence of breast and absent nipple, 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   Breast mass, right 10/20/2013   Cervical spondylosis 06/12/2021   Chronic pain syndrome 08/13/2019   Dehydration 07/24/2018   Dental crown present    Dysrhythmia, cardiac 07/23/2021   Family history of bladder cancer    Family history of breast cancer    Family history of colon cancer    Family history of pancreatic cancer    Genetic testing 08/20/2018   [employer] + [employer] was ordered (80 genes): AIP, ALK, APC*,ATM*, AXIN2, BAP1, BARD1, BLM, BMPR1A, BRCA1*, BRCA2*, BRIP1*, CDC73, CDH1*, CDK4, CDKN1B, CDKN2A, CHEK2*, CTNNA1,DICER1, FANCC, FH, FLCN, GALNT12, HOXB13, KIT, MAX, MEN1, MET, MLH1*, MRE11A, MSH2*, MSH3, MSH6*, MUTYH*, NBN, NF1*,NF2, NTHL1, PALB2*, PDGFRA, PHOX2B, PMS2*, POLD1, POLE, POT1, PRKAR1A, PTCH1, PTEN*, RAD50, RAD51C*,   Heel cord tightness, right 11/10/2020   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   Impingement  syndrome of right shoulder region 06/10/2018   Insomnia 06/19/2022   Macromastia 11/19/2013   Malignant neoplasm of upper-outer quadrant of right breast in female, 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/29/2018   Migraines    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 Please take this medication at 8 PM.   aspirin  EC 81 MG tablet Take 1 tablet (81 mg total) by mouth daily. Swallow whole.   EPINEPHrine  0.3 mg/0.3 mL IJ SOAJ injection Inject 0.3 mg into the skin as needed for anaphylaxis.   eszopiclone  (LUNESTA ) 1 MG TABS tablet Take 1 mg by mouth at bedtime as needed for sleep.   gabapentin  (NEURONTIN ) 600 MG tablet Take 600 mg by mouth 3 (three) times daily.   ibuprofen (ADVIL) 800 MG tablet Take 800 mg by mouth 3 (three) times daily.   lidocaine  (LIDODERM ) 5 % Place 1 patch onto the skin daily.   Multiple Vitamin (MULTIVITAMIN WITH MINERALS) TABS tablet Take 1 tablet by mouth daily.   ondansetron  (ZOFRAN -ODT) 4 MG disintegrating tablet Take 4 mg by mouth every 8 (eight) hours as needed for nausea.      EKGs/Labs/Other Studies Reviewed:    The following studies were reviewed today:  Cardiac Studies & Procedures      ECHOCARDIOGRAM  ECHOCARDIOGRAM COMPLETE 08/30/2022  Narrative ECHOCARDIOGRAM REPORT    Patient Name:   Holly Wiley Date of Exam: 08/30/2022 Medical Rec #:  [id number]       Height:       67.0 in Accession #:    [id number]      Weight:       183.2 lb Date of Birth:  [birth year]       BSA:          1.948 m Patient Age:    48 years        BP:           132/84 mmHg Patient Gender: F               HR:           87 bpm. Exam Location:  [facility]  Procedure: 2D Echo, Cardiac Doppler, Color Doppler, Strain Analysis and 3D Echo  Indications:    Pericardial effusion [I31.39  (ICD-10-CM)]  History:        Patient has prior history of Echocardiogram examinations, most recent 09/07/2021. Dysrhythmia, cardiac.  Sonographer:    Charlie Jointer RDCS Referring Phys: [id number] Gil Ingwersen J Gardenia Witter  IMPRESSIONS   1. Left ventricular ejection fraction, by estimation, is 55 to 60%. The left ventricle has normal function. The left ventricle has no regional wall motion abnormalities. Left ventricular diastolic parameters are consistent with Grade I diastolic dysfunction (impaired relaxation). The average left ventricular global longitudinal strain is -17.5 %. 2. Right ventricular systolic function is normal. The right ventricular size is normal. There is normal pulmonary artery systolic pressure. 3. The mitral valve is normal in structure. No evidence of mitral valve regurgitation. No evidence  of mitral stenosis. 4. The aortic valve is tricuspid. Aortic valve regurgitation is not visualized. No aortic stenosis is present. 5. Aortic Normal DTA. 6. The inferior vena cava is normal in size with greater than 50% respiratory variability, suggesting right atrial pressure of 3 mmHg.  FINDINGS Left Ventricle: Left ventricular ejection fraction, by estimation, is 55 to 60%. The left ventricle has normal function. The left ventricle has no regional wall motion abnormalities. The average left ventricular global longitudinal strain is -17.5 %. The left ventricular internal cavity size was normal in size. There is no left ventricular hypertrophy. Left ventricular diastolic parameters are consistent with Grade I diastolic dysfunction (impaired relaxation). Normal left ventricular filling pressure.  Right Ventricle: The right ventricular size is normal. No increase in right ventricular wall thickness. Right ventricular systolic function is normal. There is normal pulmonary artery systolic pressure. The tricuspid regurgitant velocity is 1.77 m/s, and with an assumed right atrial pressure of 3 mmHg,  the estimated right ventricular systolic pressure is 15.5 mmHg.  Left Atrium: Left atrial size was normal in size.  Right Atrium: Right atrial size was normal in size.  Pericardium: Trivial pericardial effusion is present. The pericardial effusion is localized near the right atrium.  Mitral Valve: The mitral valve is normal in structure. No evidence of mitral valve regurgitation. No evidence of mitral valve stenosis.  Tricuspid Valve: The tricuspid valve is normal in structure. Tricuspid valve regurgitation is mild . No evidence of tricuspid stenosis.  Aortic Valve: The aortic valve is tricuspid. Aortic valve regurgitation is not visualized. No aortic stenosis is present.  Pulmonic Valve: The pulmonic valve was normal in structure. Pulmonic valve regurgitation is not visualized. No evidence of pulmonic stenosis.  Aorta: Normal DTA, the aortic root and ascending aorta are structurally normal, with no evidence of dilitation and the aortic arch was not well visualized.  Venous: A normal flow pattern is recorded from the right upper pulmonary vein. The inferior vena cava is normal in size with greater than 50% respiratory variability, suggesting right atrial pressure of 3 mmHg.  IAS/Shunts: No atrial level shunt detected by color flow Doppler.   LEFT VENTRICLE PLAX 2D LVIDd:         4.50 cm   Diastology LVIDs:         3.00 cm   LV e' medial:    9.48 cm/s LV PW:         1.00 cm   LV E/e' medial:  7.5 LV IVS:        0.90 cm   LV e' lateral:   9.32 cm/s LVOT diam:     1.80 cm   LV E/e' lateral: 7.6 LV SV:         48 LV SV Index:   25        2D Longitudinal Strain LVOT Area:     2.54 cm  2D Strain GLS Avg:     -17.5 %  3D Volume EF: 3D EF:        57 % LV EDV:       98 ml LV ESV:       42 ml LV SV:        56 ml  RIGHT VENTRICLE            IVC RV Basal diam:  2.90 cm    IVC diam: 1.50 cm RV S prime:     8.70 cm/s TAPSE (M-mode): 1.8 cm  LEFT ATRIUM  Index        RIGHT  ATRIUM           Index LA diam:        2.80 cm 1.44 cm/m   RA Area:     12.30 cm LA Vol (A2C):   25.5 ml 13.09 ml/m  RA Volume:   29.00 ml  14.89 ml/m LA Vol (A4C):   26.1 ml 13.40 ml/m LA Biplane Vol: 26.3 ml 13.50 ml/m AORTIC VALVE LVOT Vmax:   86.20 cm/s LVOT Vmean:  67.000 cm/s LVOT VTI:    0.190 m  AORTA Ao Root diam: 3.40 cm Ao Asc diam:  2.50 cm Ao Desc diam: 1.70 cm  MITRAL VALVE               TRICUSPID VALVE MV Area (PHT): 3.60 cm    TR Peak grad:   12.5 mmHg MV Decel Time: 211 msec    TR Vmax:        177.00 cm/s MV E velocity: 70.70 cm/s MV A velocity: 78.10 cm/s  SHUNTS MV E/A ratio:  0.91        Systemic VTI:  0.19 m Systemic Diam: 1.80 cm  Redell Leiter MD Electronically signed by Redell Leiter MD Signature Date/Time: 08/30/2022/12:25:52 PM    Final   MONITORS  LONG TERM MONITOR (3-14 DAYS) 05/13/2022           EKG Interpretation Date/Time:  Friday August 08 2023 10:58:56 EST Ventricular Rate:  81 PR Interval:  140 QRS Duration:  84 QT Interval:  392 QTC Calculation: 455 R Axis:   69  Text Interpretation: Normal sinus rhythm Normal ECG When compared with ECG of 25-Nov-2018 22:01, PREVIOUS ECG IS PRESENT Confirmed by Leiter Redell ([id number]) on 08/08/2023 11:02:55 AM    Recent Labs: 08/04/2023: ALT 13; BUN 14; Creatinine 0.83; Hemoglobin 14.8; Platelet Count 224; Potassium 3.8; Sodium 140  Recent Lipid Panel No results found for: CHOL, TRIG, HDL, CHOLHDL, VLDL, LDLCALC, LDLDIRECT  Physical Exam:    VS:  BP 108/82   Pulse 81   Ht 5' 7 (1.702 m)   Wt 188 lb 9.6 oz (85.5 kg)   SpO2 95%   BMI 29.54 kg/m     Wt Readings from Last 3 Encounters:  08/08/23 188 lb 9.6 oz (85.5 kg)  08/09/22 183 lb 3.2 oz (83.1 kg)  06/05/22 174 lb 3.2 oz (79 kg)     GEN:  Well nourished, well developed in no acute distress HEENT: Normal NECK: No JVD; No carotid bruits LYMPHATICS: No lymphadenopathy CARDIAC: RRR, no murmurs, rubs,  gallops RESPIRATORY:  Clear to auscultation without rales, wheezing or rhonchi  ABDOMEN: Soft, non-tender, non-distended MUSCULOSKELETAL:  No edema; No deformity  SKIN: Warm and dry NEUROLOGIC:  Alert and oriented x 3 PSYCHIATRIC:  Normal affect    Signed, Redell Leiter, MD  08/08/2023 11:16 AM    [facility]

## 2023-08-08 ENCOUNTER — Ambulatory Visit: Payer: 59 | Attending: Cardiology | Admitting: Cardiology

## 2023-08-08 ENCOUNTER — Encounter: Payer: Self-pay | Admitting: Cardiology

## 2023-08-08 VITALS — BP 108/82 | HR 81 | Ht 67.0 in | Wt 188.6 lb

## 2023-08-08 DIAGNOSIS — Z01810 Encounter for preprocedural cardiovascular examination: Secondary | ICD-10-CM | POA: Diagnosis not present

## 2023-08-08 DIAGNOSIS — I3139 Other pericardial effusion (noninflammatory): Secondary | ICD-10-CM

## 2023-08-08 DIAGNOSIS — R002 Palpitations: Secondary | ICD-10-CM

## 2023-08-08 MED ORDER — ACEBUTOLOL HCL 200 MG PO CAPS: 200.0000 mg | ORAL_CAPSULE | Freq: Two times a day (BID) | ORAL | 3 refills | Status: AC

## 2023-08-08 NOTE — Patient Instructions (Signed)
 Medication Instructions:  Your physician has recommended you make the following change in your medication:   START: Acebutolol  200 mg two times daily  *If you need a refill on your cardiac medications before your next appointment, please call your pharmacy*   Lab Work: None If you have labs (blood work) drawn today and your tests are completely normal, you will receive your results only by: MyChart Message (if you have MyChart) OR A paper copy in the mail If you have any lab test that is abnormal or we need to change your treatment, we will call you to review the results.   Testing/Procedures: Your physician has requested that you have an echocardiogram. Echocardiography is a painless test that uses sound waves to create images of your heart. It provides your doctor with information about the size and shape of your heart and how well your heart's chambers and valves are working. This procedure takes approximately one hour. There are no restrictions for this procedure. Please do NOT wear cologne, perfume, aftershave, or lotions (deodorant is allowed). Please arrive 15 minutes prior to your appointment time.  Please note: We ask at that you not bring children with you during ultrasound (echo/ vascular) testing. Due to room size and safety concerns, children are not allowed in the ultrasound rooms during exams. Our front office staff cannot provide observation of children in our lobby area while testing is being conducted. An adult accompanying a patient to their appointment will only be allowed in the ultrasound room at the discretion of the ultrasound technician under special circumstances. We apologize for any inconvenience.    Follow-Up: At [facility], you and your health needs are our priority.  As part of our continuing mission to provide you with exceptional heart care, we have created designated Provider Care Teams.  These Care Teams include your primary Cardiologist  (physician) and Advanced Practice Providers (APPs -  Physician Assistants and Nurse Practitioners) who all work together to provide you with the care you need, when you need it.  We recommend signing up for the patient portal called MyChart.  Sign up information is provided on this After Visit Summary.  MyChart is used to connect with patients for Virtual Visits (Telemedicine).  Patients are able to view lab/test results, encounter notes, upcoming appointments, etc.  Non-urgent messages can be sent to your provider as well.   To learn more about what you can do with MyChart, go to [email or web address].    Your next appointment:   1 year(s)  Provider:   Redell Leiter, MD    Other Instructions None

## 2023-08-25 ENCOUNTER — Inpatient Hospital Stay (HOSPITAL_BASED_OUTPATIENT_CLINIC_OR_DEPARTMENT_OTHER): Payer: 59 | Admitting: Hematology and Oncology

## 2023-08-25 DIAGNOSIS — Z171 Estrogen receptor negative status [ER-]: Secondary | ICD-10-CM | POA: Diagnosis not present

## 2023-08-25 DIAGNOSIS — C50411 Malignant neoplasm of upper-outer quadrant of right female breast: Secondary | ICD-10-CM | POA: Diagnosis not present

## 2023-08-25 NOTE — Progress Notes (Signed)
 HEMATOLOGY-ONCOLOGY TELEPHONE VISIT PROGRESS NOTE  I connected with our patient on 08/25/23 at  8:45 AM EST by telephone and verified that I am speaking with the correct person using two identifiers.  I discussed the limitations, risks, security and privacy concerns of performing an evaluation and management service by telephone and the availability of in person appointments.  I also discussed with the patient that there may be a patient responsible charge related to this service. The patient expressed understanding and agreed to proceed.   History of Present Illness:   History of Present Illness The patient, with a history of bilateral mastectomy, presents with ongoing shoulder pain, which has been operated on three times, most recently in June. Despite these interventions, the patient reports no improvement and has been sleeping in a semi-upright position on the couch for the past year due to the discomfort. A total shoulder replacement has been tentatively scheduled, but the patient is trying to delay this as long as possible.  In addition to the physical discomfort, the patient is also struggling with mental health issues. She reports feeling worse off mentally and is currently attending therapy sessions with a group in [place], with her second appointment scheduled for today. The patient's mental health struggles are compounded by her inability to exercise due to the shoulder pain, which in turn affects her sleep.  The patient also expresses concern about her risk of breast cancer recurrence, as she is currently at the same age her mother was when her cancer returned. Despite having had a bilateral mastectomy, the patient is still undergoing annual MRIs for peace of mind.    Oncology History  Malignant neoplasm of upper-outer quadrant of right breast in female, estrogen receptor negative (HCC)  06/22/2018 Initial Diagnosis   Screening mammogram detected right breast mass 1.1 cm at 10  o'clock position right breast 12 cm from the nipple, no abnormal lymph nodes, biopsy of the mass revealed IDC with DCIS grade 3, ER 0%, PR 0%, HER-2 -1+ by IHC, Ki-67 40%, T1c N0 stage Ib   06/29/2018 Cancer Staging   Staging form: Breast, AJCC 8th Edition - Clinical stage from 06/29/2018: Stage IB (cT1c, cN0, cM0, G3, ER-, PR-, HER2-) - Signed by Serena Croissant, MD on 06/29/2018   07/09/2018 - 10/01/2018 Neo-Adjuvant Chemotherapy   Neo- Adjuvant chemotherapy with dose dense Adriamycin and Cytoxan x4 followed by Taxol and carboplatin weekly x5 (cycles 6-12 canceled due to extreme fatigue)   07/23/2018 Genetic Testing   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  A variant of uncertain significance (VUS) in a gene called APC was also noted. (c.7105C>T). Genes tested: AIP, ALK, APC*, ATM*, AXIN2, BAP1, BARD1, BLM, BMPR1A, BRCA1*, BRCA2*, BRIP1*, CDC73, CDH1*, CDK4, CDKN1B, CDKN2A, CHEK2*, CTNNA1, DICER1, FANCC, FH, FLCN, GALNT12, HOXB13, KIT, MAX, MEN1, MET, MLH1*, MRE11A, MSH2*, MSH6*, MUTYH*, NBN, NF1*, NF2, NTHL1, PALB2*, PDGFRA, PHOX2B, PMS2*, POLD1, POLE, POT1, PRKAR1A, PTCH1, PTEN*, RAD50, RAD51C*, RAD51D*, RB1, RET, SDHA, SDHAF2, SDHB, SDHC, SDHD, SMAD4, SMARCA4, SMARCB1, SMARCE1, STK11, SUFU, TMEM127, TP53*, TSC1, TSC2, VHL and XRCC2 (sequencing and deletion/duplication); CASR, CFTR, CPA1, CTRC, EGFR, MITF, PRSS1 and SPINK1 (sequencing only); EPCAM and GREM1 (deletion/duplication only). DNA and RNA analyses performed for * genes. UPDATE: APC p.P2369S VUS has been reclassified to Likely Benign.  The reclassification date is 03/13/2020.   11/06/2018 Surgery   Left mastectomy: Complete response, 0/3 lymph nodes negative   11/06/2018 Cancer Staging   Staging form:  Breast, AJCC 8th Edition - Pathologic stage from 11/06/2018: No Stage Recommended (ypT0, pN0, cM0) - Signed by Loa Socks, NP on  01/25/2019     REVIEW OF SYSTEMS:   Constitutional: Denies fevers, chills or abnormal weight loss All other systems were reviewed with the patient and are negative. 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  Neo- Adjuvant chemotherapy with dose dense Adriamycin and Cytoxan x4 followed by Taxol and carboplatin weekly x12 completed 10/01/2018 2.  Right mastectomy: 11/06/2018: Pathologic complete response Followed by surveillance --------------------------------------------------------------------------------------------------------------------------------- Severe intractable Axillary itching: She has tried everything to stop the itching.      Her mom passed away.  Her dad passed away 2 years ago.  She is still grieving the loss to the extent that she is unable to move forward.  She cannot sleep for more than 4 hours a day. She is seeking help with psychiatry and counselors.   Breast cancer surveillance: 1.  Breast exam 08/25/2023: Bilateral mastectomies with scar from one end of the chest to the other 2. ultrasound left breast mastectomy scar: 1.2 cm hypoechoic scar: Fat necrosis 3. Bone Scan and CT CAP 09/22/20: Benign   4.  Breast MRI 06/17/2022: No MRI evidence of malignancy.  27-month ultrasound follow-up of the right breast   Return to clinic in 1 year for follow-up --------------------------------- Assessment and Plan Assessment & Plan Shoulder Pain Chronic shoulder pain due to multiple surgeries, with no improvement. Patient is considering total shoulder replacement. -Continue current management and  follow up with orthopedic surgeon, Dr. Rennis Chris, for potential total shoulder replacement.  History of Bilateral Mastectomy No current concerns. MRI results are satisfactory. -Continue annual MRI screenings for patient reassurance. Next MRI scheduled for January 2026.  Mental Health Patient reports worsening mental health. Currently attending therapy sessions with [facility] in [place]. -Encourage continuation of therapy sessions and other mental health improvement strategies.  General Health Maintenance / Followup Plans -Cancel July appointment as it appears to be an error. -Schedule follow-up appointment for January 2026.      I discussed the assessment and treatment plan with the patient. The patient was provided an opportunity to ask questions and all were answered. The patient agreed with the plan and demonstrated an understanding of the instructions. The patient was advised to call back or seek an in-person evaluation if the symptoms worsen or if the condition fails to improve as anticipated.   I provided 20 minutes of non-face-to-face time during this encounter.  This includes time for charting and coordination of care   Tamsen Meek, MD

## 2023-08-25 NOTE — Assessment & Plan Note (Signed)
 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  Neo- Adjuvant chemotherapy with dose dense Adriamycin and Cytoxan x4 followed by Taxol and carboplatin weekly x12 completed 10/01/2018 2.  Right mastectomy: 11/06/2018: Pathologic complete response Followed by surveillance --------------------------------------------------------------------------------------------------------------------------------- Severe intractable Axillary itching: She has tried everything to stop the itching.      Her mom passed away.  Her dad passed away 2 years ago.  She is still grieving the loss to the extent that she is unable to move forward.  She cannot sleep for more than 4 hours a day. She is seeking help with psychiatry and counselors.   Breast cancer surveillance: 1.  Breast exam 08/25/2023: Bilateral mastectomies with scar from one end of the chest to the other 2. ultrasound left breast mastectomy scar: 1.2 cm hypoechoic scar: Fat necrosis 3. Bone Scan and CT CAP 09/22/20: Benign   4.  Breast MRI 06/17/2022: No MRI evidence of malignancy.  70-month ultrasound follow-up of the right breast   Return to clinic in 1 year for follow-up

## 2023-08-27 ENCOUNTER — Telehealth: Payer: Self-pay | Admitting: Hematology and Oncology

## 2023-08-27 NOTE — Telephone Encounter (Signed)
 Canceled and rescheduled appointments per 2/24 los. Left VM with appointment details.

## 2023-09-08 ENCOUNTER — Ambulatory Visit: Payer: 59 | Attending: Cardiology

## 2023-09-08 ENCOUNTER — Encounter: Payer: Self-pay | Admitting: Cardiology

## 2023-09-08 DIAGNOSIS — Z01810 Encounter for preprocedural cardiovascular examination: Secondary | ICD-10-CM

## 2023-09-08 DIAGNOSIS — R002 Palpitations: Secondary | ICD-10-CM | POA: Diagnosis not present

## 2023-09-08 DIAGNOSIS — I3139 Other pericardial effusion (noninflammatory): Secondary | ICD-10-CM | POA: Diagnosis not present

## 2023-09-08 LAB — ECHOCARDIOGRAM COMPLETE: S' Lateral: 3.3 cm

## 2023-09-08 NOTE — Progress Notes (Signed)
 Left message for the patient to call back.

## 2023-09-24 ENCOUNTER — Telehealth: Payer: Self-pay | Admitting: *Deleted

## 2023-09-24 NOTE — Telephone Encounter (Signed)
      Pre-operative Risk Assessment    Patient Name: Holly Wiley  DOB: [birth year] MRN: [id number]   Date of last office visit: 08-08-23  Date of next office visit:  N/A    Request for Surgical Clearance    Procedure:   LEFT REVERSE SHOULDER ARTHROPLASTY    Date of Surgery:  Clearance TBD                                 Surgeon:  DR Caryn Bee SUPPLE  Surgeon's Group or Practice Name:  [facility] Phone number:  [phone] Fax number:  [phone]   Type of Clearance Requested:   - Medical    Type of Anesthesia:  General  Signed, Oleta Mouse   09/24/2023, 2:26 PM

## 2023-09-24 NOTE — Telephone Encounter (Signed)
   Primary Cardiologist: None  Chart reviewed as part of pre-operative protocol coverage. Given past medical history and time since last visit, based on ACC/AHA guidelines, Holly Wiley would be at acceptable risk for the planned procedure without further cardiovascular testing.   Patient was advised that if she develops new symptoms prior to surgery to contact our office to arrange a follow-up appointment.  He verbalized understanding.  I will route this recommendation to the requesting party via Epic fax function and remove from pre-op pool.  Please call with questions.  Levi Aland, NP-C  09/24/2023, 2:46 PM [address], [address] Office [phone] Fax [phone]

## 2024-01-12 ENCOUNTER — Ambulatory Visit: Payer: Managed Care, Other (non HMO) | Admitting: Hematology and Oncology

## 2024-01-12 ENCOUNTER — Other Ambulatory Visit: Payer: Managed Care, Other (non HMO)

## 2024-02-23 ENCOUNTER — Ambulatory Visit (INDEPENDENT_AMBULATORY_CARE_PROVIDER_SITE_OTHER): Admitting: Student

## 2024-02-23 ENCOUNTER — Encounter (HOSPITAL_BASED_OUTPATIENT_CLINIC_OR_DEPARTMENT_OTHER): Payer: Self-pay | Admitting: Student

## 2024-02-23 VITALS — BP 103/72 | HR 70 | Temp 98.0°F | Resp 16 | Ht 65.16 in | Wt 179.5 lb

## 2024-02-23 DIAGNOSIS — Z91030 Bee allergy status: Secondary | ICD-10-CM

## 2024-02-23 DIAGNOSIS — F5101 Primary insomnia: Secondary | ICD-10-CM | POA: Diagnosis not present

## 2024-02-23 DIAGNOSIS — F332 Major depressive disorder, recurrent severe without psychotic features: Secondary | ICD-10-CM

## 2024-02-23 DIAGNOSIS — Z7689 Persons encountering health services in other specified circumstances: Secondary | ICD-10-CM | POA: Diagnosis not present

## 2024-02-23 MED ORDER — EPINEPHRINE 0.3 MG/0.3ML IJ SOAJ
0.3000 mg | INTRAMUSCULAR | 5 refills | Status: AC | PRN
Start: 2024-02-23 — End: ?

## 2024-02-23 MED ORDER — DOXEPIN HCL 10 MG PO CAPS: 10.0000 mg | ORAL_CAPSULE | Freq: Every day | ORAL | 3 refills | Status: DC

## 2024-02-23 MED ORDER — LEXAPRO 10 MG PO TABS: 10.0000 mg | ORAL_TABLET | Freq: Every day | ORAL | 5 refills | Status: DC

## 2024-02-23 NOTE — Patient Instructions (Addendum)
 It was nice to see you today!  As we discussed in clinic:  A type of therapy known as CBT-I is considered the first-line treatment for insomnia. It may even be able to help 4/5 individuals get better sleep. At this time I am unaware of any institutions offer CBT-I within our area.  There are some online therapy use which can be both effective for sleep and cost effective as well.  Go! To Sleep- Web-based App-this is a Energy manager therapy platform created by [facility] clinic for patients with insomnia.  Last time checked, the cost was around $40 (pretty cheap for better sleep!).  CBT-I Coach- iOS/Android App- This app provides education about sleep and CBT-I, personalized feedback about your sleep, and an option for sleep diary to track wake and sleep times.  Sleepio-  iOS/Android App and Web-based App- A fairly popular option, may be worth a try!  [facility]omnia- (text based)- this option doesn't provide with audio or with an app but cory has some helpful information for you!  I would also recommend beginning 400mg  of magnesium glycinate nightly.  If you have any problems before your next visit feel free to message me via MyChart (minor issues or questions) or call the office, otherwise you may reach out to schedule an office visit.  Thank you! Margarita Croke, PA-C

## 2024-02-23 NOTE — Progress Notes (Signed)
 New Patient Office Visit  Subjective    Patient ID: Holly Wiley, female    DOB: August 23, 1975  Age: 48 y.o. MRN: [id number]  CC:  Chief Complaint  Patient presents with   Establish Care    Here to establish care. Will be having an ablation on branch nerves on lower spine.   Annual Exam    Annual exam w/ fasting labs.    Discussed the use of AI scribe software for clinical note transcription with the patient, who gave verbal consent to proceed.  History of Present Illness   Holly Wiley is a 48 year old female who presents to establish care.  She has a history of triple negative breast cancer and underwent aggressive chemotherapy. She experiences nocturnal tachycardia and takes Sectral  once daily, although it was prescribed twice daily to reduce pharmacy visits. She has annual echocardiograms, with the most recent performed in February. She follows up with her cardiologist annually.  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 She has experienced three anaphylactic episodes, one of which occurred in a park.  She suffers from insomnia and is currently taking Lunesta. She has tried other medications in the past, including amitriptyline and trazodone, which were ineffective or caused adverse reactions. She has been under significant stress due to personal losses and medical issues, including the loss of both parents in the last five years and a failed shoulder surgery last June.  She reports severe lower back pain and is under the care of a specialist. She recently completed two rounds of diagnostic injections and is scheduled for a radiofrequency ablation. She is prescribed hydrocodone  and gabapentin  600 mg three times a day for pain management, but uses them sparingly due to side effects and her work schedule. She reports significant impact on her quality of life, stating 'I have no life, no cycling, no paddling.'  She has a history  of depression, with a recent high score on a depression screening. She is currently taking Lexapro , but reports it is not effective. She has tried multiple antidepressants in the past, including Effexor  and Cymbalta , with limited success. She expresses significant distress over her current situation, stating 'I'm miserable. I want to die.' She has a family history of mental health issues, with a nephew diagnosed with schizoaffective disorder. She is also in the process of selling her house, adding to her stress.      Outpatient Encounter Medications as of 02/23/2024  Medication Sig   acebutolol  (SECTRAL ) 200 MG capsule Take 1 capsule (200 mg total) by mouth 2 (two) times daily. (Patient taking differently: Take 200 mg by mouth daily.)   doxepin  (SINEQUAN ) 10 MG capsule Take 1 capsule (10 mg total) by mouth at bedtime.   gabapentin  (NEURONTIN ) 600 MG tablet Take 600 mg by mouth 3 (three) times daily. (Patient taking differently: Take 600 mg by mouth as needed (Three times a day prn).)   HYDROcodone -acetaminophen  (NORCO) 10-325 MG tablet Take 1 tablet by mouth as needed (three times a day as needed).   lidocaine  (LIDODERM ) 5 % Place 1 patch onto the skin daily.   Multiple Vitamin (MULTIVITAMIN WITH MINERALS) TABS tablet Take 1 tablet by mouth daily.   ondansetron  (ZOFRAN -ODT) 4 MG disintegrating tablet Take 4 mg by mouth every 8 (eight) hours as needed for nausea.   tirzepatide 7.5 MG/0.5ML injection vial Inject 7.5 mg into the skin once a week. THROUGH MANUFACTURING COMPANY ONLY   [  DISCONTINUED] aspirin  EC 81 MG tablet Take 1 tablet (81 mg total) by mouth daily. Swallow whole.   [DISCONTINUED] EPINEPHrine  0.3 mg/0.3 mL IJ SOAJ injection Inject 0.3 mg into the skin as needed for anaphylaxis.   [DISCONTINUED] eszopiclone (LUNESTA) 1 MG TABS tablet Take 1 mg by mouth at bedtime as needed for sleep.   [DISCONTINUED] ibuprofen (ADVIL) 800 MG tablet Take 800 mg by mouth 3 (three) times daily.   EPINEPHrine   0.3 mg/0.3 mL IJ SOAJ injection Inject 0.3 mg into the skin as needed for anaphylaxis.   LEXAPRO  10 MG tablet Take 1 tablet (10 mg total) by mouth daily.   [DISCONTINUED] LEXAPRO  10 MG tablet Take 10 mg by mouth daily.   No facility-administered encounter medications on file as of 02/23/2024.    Past Medical History:  Diagnosis Date   Acquired absence of breast and absent nipple, 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   Breast mass, right 10/20/2013   Cervical spondylosis 06/12/2021   Chronic pain syndrome 08/13/2019   Dehydration 07/24/2018   Dental crown present    Dysrhythmia, cardiac 07/23/2021   Family history of bladder cancer    Family history of breast cancer    Family history of colon cancer    Family history of pancreatic cancer    Genetic testing 08/20/2018   [employer] + RNA[employer]ight was ordered (80 genes): AIP, ALK, APC*,ATM*, AXIN2, BAP1, BARD1, BLM, BMPR1A, BRCA1*, BRCA2*, BRIP1*, CDC73, CDH1*, CDK4, CDKN1B, CDKN2A, CHEK2*, CTNNA1,DICER1, FANCC, FH, FLCN, GALNT12, HOXB13, KIT, MAX, MEN1, MET, MLH1*, MRE11A, MSH2*, MSH3, MSH6*, MUTYH*, NBN, NF1*,NF2, NTHL1, PALB2*, PDGFRA, PHOX2B, PMS2*, POLD1, POLE, POT1, PRKAR1A, PTCH1, PTEN*, RAD50, RAD51C*,   Heel cord tightness, right 11/10/2020   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   Impingement syndrome of right shoulder region 06/10/2018   Insomnia 06/19/2022   Macromastia 11/19/2013   Malignant neoplasm of upper-outer quadrant of right breast in female, 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/29/2018   Migraines    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), heterozygous     Nausea and vomiting 07/24/2018   Nausea without vomiting 07/24/2018   Neck pain 05/26/2021   Pain in joint of left shoulder 05/19/2014   Plantar fasciitis 11/10/2020   PONV (postoperative nausea and vomiting)    Port-A-Cath in place 07/16/2018   Postoperative wound infection 11/26/2018   Wound infection after surgery 11/28/2018    Past Surgical History:  Procedure Laterality Date   BILATERAL TOTAL MASTECTOMY WITH AXILLARY LYMPH NODE DISSECTION N/A 11/06/2018   Procedure: BILATERAL TOTAL MASTECTOMY WITH AXILLARY LYMPH NODE DISSECTION;  Surgeon: Gail Favorite, MD;  Location: [facility] OR;  Service: General;  Laterality: N/A;   BREAST BIOPSY Right 11/08/2013   Procedure: EXCISION OF RIGHT  BREAST MASS;  Surgeon: Favorite CHRISTELLA Gail, MD;  Location: [facility];  Service: General;  Laterality: Right;   BREAST RECONSTRUCTION WITH MASTOPLASTY Bilateral 08/03/2019   Procedure: Revision bilateral mastectomy flaps;  Surgeon: Arelia Filippo, MD;  Location: [facility];  Service: Plastics;  Laterality: Bilateral;   BUNIONECTOMY Left    CARPAL TUNNEL RELEASE Right 02/26/2017   CERVICAL CONE BIOPSY  05/2005   COLONOSCOPY  2014   DRESSING CHANGE UNDER ANESTHESIA Right 11/29/2018   Procedure: DRESSING CHANGE UNDER ANESTHESIA;  Surgeon: Gail Favorite, MD;  Location: [facility] OR;  Service: General;  Laterality: Right;   IRRIGATION AND DEBRIDEMENT ABSCESS Right 11/28/2018   Procedure: Exploration and Drainage of Right Mastectomy wound.;  Surgeon: Gail Favorite, MD;  Location: [facility] OR;  Service: General;  Laterality: Right;   PORT-A-CATH REMOVAL N/A 11/06/2018   Procedure: Removal Port-A-Cath;  Surgeon: Gail Favorite, MD;  Location: [facility] OR;  Service: General;  Laterality: N/A;   PORTACATH PLACEMENT Right 07/08/2018   Procedure: INSERTION PORT-A-CATH;  Surgeon: Gail Favorite, MD;  Location: [facility];  Service: General;  Laterality: Right;    SHOULDER ARTHROSCOPY W/ LABRAL REPAIR Right    SHOULDER ARTHROSCOPY W/ ROTATOR CUFF REPAIR Left    TENOTOMY FOREARM / WRIST Right 02/26/2017    Family History  Problem Relation Age of Onset   Breast cancer Mother 14       triple negative, GT neg   Alzheimer's disease Father    Healthy Sister    Diabetes Maternal Aunt    Bladder Cancer Maternal Aunt 69   Colon cancer Maternal Uncle 65   Diabetes Maternal Uncle    Diabetes Maternal Grandmother    Stroke Paternal Grandfather    Colon cancer Other 60   Breast cancer Other        dx >50   Colon cancer Other        dx>50   Pancreatic cancer Other    Esophageal cancer Neg Hx    Rectal cancer Neg Hx     Social History   Socioeconomic History   Marital status: Divorced    Spouse name: Not on file   Number of children: 0   Years of education: Not on file   Highest education level: Not on file  Occupational History   Occupation: Hospice nurse  Tobacco Use   Smoking status: Never    Passive exposure: Never   Smokeless tobacco: Never  Vaping Use   Vaping status: Never Used  Substance and Sexual Activity   Alcohol use: Not Currently    Comment: been a while   Drug use: No   Sexual activity: Not Currently    Birth control/protection: Abstinence  Other Topics Concern   Not on file  Social History Narrative   Not on file   Social Drivers of Health   Financial Resource Strain: Not on file  Food Insecurity: No Food Insecurity (02/23/2024)   Hunger Vital Sign    Worried About Running Out of Food in the Last Year: Never true    Ran Out of Food in the Last Year: Never true  Transportation Needs: No Transportation Needs (02/23/2024)   PRAPARE - Transportation    Lack of Transportation (Medical): No    Lack of Transportation (Non-Medical): No  Physical Activity: Not on file  Stress: Not on file  Social Connections: Unknown (11/08/2021)   [facility]   Social Network    Social Network: Not on file  Intimate  Partner Violence: Not At Risk (02/23/2024)   Humiliation, Afraid, Rape, and Kick questionnaire    Fear of Current or Ex-Partner: No    Emotionally Abused: No    Physically Abused: No    Sexually Abused: No    ROS  Per HPI      Objective    BP 103/72   Pulse 70   Temp 98 F (  36.7 C) (Oral)   Resp 16   Ht 5' 5.16 (1.655 m)   Wt 179 lb 8 oz (81.4 kg)   LMP  (Approximate)   SpO2 97%   BMI 29.73 kg/m   Physical Exam Constitutional:      General: She is not in acute distress.    Appearance: Normal appearance. She is not ill-appearing.     Comments: Patient appears in pain with back throughout examination  HENT:     Head: Normocephalic and atraumatic.     Nose: Nose normal.  Eyes:     General: No scleral icterus.    Conjunctiva/sclera: Conjunctivae normal.  Cardiovascular:     Rate and Rhythm: Normal rate and regular rhythm.     Heart sounds: Normal heart sounds. No murmur heard.    No friction rub.  Pulmonary:     Effort: Pulmonary effort is normal. No respiratory distress.     Breath sounds: Normal breath sounds. No wheezing, rhonchi or rales.  Musculoskeletal:        General: Normal range of motion.  Skin:    General: Skin is warm and dry.     Coloration: Skin is not jaundiced or pale.  Neurological:     General: No focal deficit present.     Mental Status: She is alert.  Psychiatric:        Mood and Affect: Mood normal.        Behavior: Behavior normal.     Last CBC Lab Results  Component Value Date   WBC 6.1 08/04/2023   HGB 14.8 08/04/2023   HCT 42.3 08/04/2023   MCV 89.2 08/04/2023   MCH 31.2 08/04/2023   RDW 13.0 08/04/2023   PLT 224 08/04/2023   Last metabolic panel Lab Results  Component Value Date   GLUCOSE 91 08/04/2023   NA 140 08/04/2023   K 3.8 08/04/2023   CL 101 08/04/2023   CO2 28 08/04/2023   BUN 14 08/04/2023   CREATININE 0.83 08/04/2023   GFRNONAA >60 08/04/2023   CALCIUM 9.7 08/04/2023   PROT 7.0 08/04/2023   ALBUMIN   4.5 08/04/2023   BILITOT 0.3 08/04/2023   ALKPHOS 73 08/04/2023   AST 18 08/04/2023   ALT 13 08/04/2023   ANIONGAP 11 08/04/2023      Assessment & Plan:   Assessment and Plan    Encounter to Establish Care  Insomnia Chronic, not at goal. Chronic insomnia with previous trials of amitriptyline and trazodone without success. Currently on Lunesta but interested in alternative treatments. Open to cognitive behavioral therapy for insomnia (CBT-I) and other non-pharmacological interventions. - Try self-paced CBT-I resources - Start magnesium glycinate 400 mg nightly - Switch from Lunesta to doxepin  10 mg nightly - Consider referral to CBT-I specialist in [place] if self-paced resources are ineffective  Depression Chronic, not at goal. High depression screening score. Previous trials of Lexapro  and other antidepressants with limited success. Interested in gene site testing to guide future medication choices. Significant life stressors contributing to mood symptoms. - Refill Lexapro  - Consider gene site testing for psychotropic medication guidance - Compile list of previously tried medications for insurance purposes  Chronic Back Pain Follows with Dr. Bonner. Chronic lower back pain with severe symptoms impacting daily activities. Recent completion of two diagnostic injections with plans for radiofrequency ablation. Gabapentin  not well-tolerated due to side effects. Hydrocodone  used sparingly for pain management. - Proceed with radiofrequency ablation as planned - Use hydrocodone  sparingly for pain management - Ensure records from Dr.  Ramos are sent over  Nocturnal tachycardia Nocturnal tachycardia managed with Sectral . Regular follow-up with cardiologist and annual echocardiograms. Last echocardiogram in February was normal. 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 Carries EpiPen  at all times. - Prescribe dual pack of EpiPens      Return in about 4 weeks (around 03/22/2024) for Annual Physical and followup.   Leveta Wahab T Ariyonna Twichell, PA-C

## 2024-02-24 ENCOUNTER — Encounter (HOSPITAL_BASED_OUTPATIENT_CLINIC_OR_DEPARTMENT_OTHER): Payer: Self-pay

## 2024-02-27 ENCOUNTER — Encounter (HOSPITAL_BASED_OUTPATIENT_CLINIC_OR_DEPARTMENT_OTHER): Payer: Self-pay | Admitting: Student

## 2024-03-01 ENCOUNTER — Other Ambulatory Visit (HOSPITAL_BASED_OUTPATIENT_CLINIC_OR_DEPARTMENT_OTHER): Payer: Self-pay | Admitting: Student

## 2024-03-01 DIAGNOSIS — F5101 Primary insomnia: Secondary | ICD-10-CM

## 2024-03-01 MED ORDER — ESZOPICLONE 3 MG PO TABS: 3.0000 mg | ORAL_TABLET | Freq: Every day | ORAL | 0 refills | Status: DC

## 2024-03-16 ENCOUNTER — Encounter: Payer: Self-pay | Admitting: Hematology and Oncology

## 2024-03-19 ENCOUNTER — Other Ambulatory Visit (HOSPITAL_BASED_OUTPATIENT_CLINIC_OR_DEPARTMENT_OTHER): Payer: Self-pay | Admitting: Student

## 2024-03-19 DIAGNOSIS — R110 Nausea: Secondary | ICD-10-CM

## 2024-03-22 ENCOUNTER — Ambulatory Visit (HOSPITAL_BASED_OUTPATIENT_CLINIC_OR_DEPARTMENT_OTHER): Admitting: Student

## 2024-03-22 ENCOUNTER — Encounter (HOSPITAL_BASED_OUTPATIENT_CLINIC_OR_DEPARTMENT_OTHER): Payer: Self-pay | Admitting: Student

## 2024-03-22 ENCOUNTER — Telehealth (HOSPITAL_BASED_OUTPATIENT_CLINIC_OR_DEPARTMENT_OTHER): Payer: Self-pay | Admitting: *Deleted

## 2024-03-22 VITALS — BP 100/71 | HR 77 | Ht 65.0 in | Wt 178.0 lb

## 2024-03-22 DIAGNOSIS — F332 Major depressive disorder, recurrent severe without psychotic features: Secondary | ICD-10-CM

## 2024-03-22 DIAGNOSIS — Z131 Encounter for screening for diabetes mellitus: Secondary | ICD-10-CM

## 2024-03-22 DIAGNOSIS — Z13220 Encounter for screening for lipoid disorders: Secondary | ICD-10-CM | POA: Diagnosis not present

## 2024-03-22 DIAGNOSIS — Z136 Encounter for screening for cardiovascular disorders: Secondary | ICD-10-CM

## 2024-03-22 DIAGNOSIS — Z0000 Encounter for general adult medical examination without abnormal findings: Secondary | ICD-10-CM | POA: Diagnosis not present

## 2024-03-22 NOTE — Progress Notes (Signed)
 Complete physical exam  Patient: Holly Wiley   DOB: Jul 04, 1975   48 y.o. Female  MRN: [id number]  Subjective:    Chief Complaint  Patient presents with   Annual Exam   Discussed the use of AI scribe software for clinical note transcription with the patient, who gave verbal consent to proceed.  History of Present Illness   Holly Wiley is a 48 year old female with chronic back pain who presents for her annual physical.  She experiences severe chronic back pain, described as 'miserable', which has significantly impacted her quality of life. The pain has persisted since her last day of work on July 16th. She is scheduled for an ablation procedure on October 10th.  She has been prescribed hydrocodone  for pain management but experiences adverse effects from opioids, describing them as making her feel 'yucky'. She takes Zofran  to mitigate these effects and only takes half a tablet of hydrocodone  at a time. 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  She experiences issues with sleep and does not take her sleeping medication every night. Doxepin  caused her heart rate to spike, reaching the 120s to low 130s for several hours, which she found intolerable. She has previously tried amitriptyline and nortriptyline, with similar adverse effects on her heart rate.  She is frustrated with her current medication regimen, including Lexapro , which she is currently taking without noticeable benefit but also without adverse effects. She has tried multiple medications in the past, including Effexor , amitriptyline, and Cymbalta , without success.  She mentions significant stress related to her family, particularly concerning her nephew who has been diagnosed with schizoaffective disorder and is not doing well. This situation adds to her emotional burden.  Her diet is described as healthy, consisting of minimal fried foods, plenty of dark  green vegetables, and good quality proteins such as grilled or baked chicken and fish. The patient denies any thoughts of self-harm or suicide.       Most recent fall risk assessment:    03/22/2024    9:00 AM  Fall Risk   Falls in the past year? 1  Number falls in past yr: 1  Injury with Fall? 0  Risk for fall due to : Other (Comment)  Risk for fall due to: Comment back issue  Follow up Falls evaluation completed;Follow up appointment     Most recent depression screenings:    03/22/2024    9:01 AM 02/23/2024    8:40 AM  PHQ 2/9 Scores  PHQ - 2 Score 6 6  PHQ- 9 Score 27 24      03/22/2024    9:02 AM 02/23/2024    8:40 AM  GAD 7 : Generalized Anxiety Score  Nervous, Anxious, on Edge 3 2  Control/stop worrying 3 1  Worry too much - different things 3 1  Trouble relaxing 3 3  Restless 3 3  Easily annoyed or irritable 3 2  Afraid - awful might happen 3 1  Total GAD 7 Score 21 13  Anxiety Difficulty Extremely difficult Extremely difficult    Patient Active Problem List   Diagnosis Date Noted   Insomnia 06/19/2022   Dental crown present 07/23/2021   Migraines 07/23/2021   Dysrhythmia, cardiac 07/23/2021   Cervical spondylosis 06/12/2021   Neck pain 05/26/2021   Heel cord tightness, right 11/10/2020   Plantar fasciitis 11/10/2020   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  Chronic pain syndrome 08/13/2019   Acquired absence of breast and absent nipple, 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   Brachial plexopathy 03/22/2019   Wound infection after surgery 11/28/2018   Breast cancer (HCC) 11/26/2018   Postoperative wound infection 11/26/2018   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   Genetic testing 08/20/2018   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   Dehydration 07/24/2018   Nausea and vomiting 07/24/2018   Nausea without vomiting 07/24/2018   Family history of breast cancer    Family history of colon cancer    Family history  of bladder cancer    Family history of pancreatic cancer    Port-A-Cath in place 07/16/2018   Malignant neoplasm of upper-outer quadrant of right breast in female, 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/29/2018   Impingement syndrome of right shoulder region 06/10/2018   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   Breast mass, right 10/20/2013   Cervical spondylosis 06/12/2021   Chronic pain syndrome 08/13/2019   Dehydration 07/24/2018   Dental crown present    Dysrhythmia, cardiac 07/23/2021   Family history of bladder cancer    Family history of breast cancer    Family history of colon cancer    Family history of pancreatic cancer    Genetic testing 08/20/2018   [employer] [facility] [employer] was ordered (80 genes): AIP, ALK, APC*,ATM*, AXIN2, BAP1, BARD1, BLM, BMPR1A, BRCA1*, BRCA2*, BRIP1*, CDC73, CDH1*, CDK4, CDKN1B, CDKN2A, CHEK2*, CTNNA1,DICER1, FANCC, FH, FLCN, GALNT12, HOXB13, KIT, MAX, MEN1, MET, MLH1*, MRE11A, MSH2*, MSH3, MSH6*, MUTYH*, NBN, NF1*,NF2, NTHL1, PALB2*, PDGFRA, PHOX2B, PMS2*, POLD1, POLE, POT1, PRKAR1A, PTCH1, PTEN*, RAD50, RAD51C*,   Heel cord tightness, right 11/10/2020   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   Impingement syndrome of right shoulder region 06/10/2018   Insomnia 06/19/2022    Macromastia 11/19/2013   Malignant neoplasm of upper-outer quadrant of right breast in female, 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/29/2018   Migraines    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), heterozygous     Nausea and vomiting 07/24/2018   Nausea without vomiting 07/24/2018   Neck pain 05/26/2021   Pain in joint of left shoulder 05/19/2014   Plantar fasciitis 11/10/2020   PONV (postoperative nausea and vomiting)    Port-A-Cath in place 07/16/2018   Postoperative wound infection 11/26/2018   Wound infection after surgery 11/28/2018   Social History   Tobacco Use   Smoking status: Never    Passive exposure: Never   Smokeless tobacco: Never  Vaping Use   Vaping status: Never Used  Substance Use Topics   Alcohol use: Not Currently    Comment: been a while   Drug use: No   Allergies  Allergen Reactions   Bee Pollen Anaphylaxis   Bee Venom Anaphylaxis   Dihydroergotamine Anaphylaxis  Dhea     Other reaction(s): Other   Imitrex [Sumatriptan] Other (See Comments)    SEIZURE-LIKE ACTIVITY   Metoclopramide  Other (See Comments)    TACHYCARDIA   Nitroglycerin     Bottom BP OUT    Transderm-Scop [Scopolamine ] Other (See Comments)    AGITATION      Patient Care Team: Cowan Pilar T, PA-C as PCP - General (Physician Assistant) Odean Potts, MD as Consulting Physician (Hematology and Oncology) Gail Favorite, MD as Consulting Physician (General Surgery)   Outpatient Medications Prior to Visit  Medication Sig   acebutolol  (SECTRAL ) 200 MG capsule Take 1 capsule (200 mg total) by mouth 2 (two) times daily. (Patient taking differently: Take 200 mg by mouth daily. Once daily)   EPINEPHrine  0.3 mg/0.3 mL IJ SOAJ injection Inject 0.3 mg into the skin as needed for anaphylaxis.   Eszopiclone  3 MG TABS Take 1 tablet (3 mg total) by mouth at bedtime. Take immediately before bedtime   gabapentin  (NEURONTIN ) 600 MG tablet Take 600 mg by mouth 3  (three) times daily. (Patient taking differently: Take 600 mg by mouth 3 (three) times daily. 3 times a day as needed)   HYDROcodone -acetaminophen  (NORCO) 10-325 MG tablet Take 1 tablet by mouth as needed (three times a day as needed).   LEXAPRO  10 MG tablet Take 1 tablet (10 mg total) by mouth daily.   lidocaine  (LIDODERM ) 5 % Place 1 patch onto the skin daily.   Multiple Vitamin (MULTIVITAMIN WITH MINERALS) TABS tablet Take 1 tablet by mouth daily.   ondansetron  (ZOFRAN -ODT) 4 MG disintegrating tablet TAKE 1 tablet by mouth as needed]   tirzepatide 7.5 MG/0.5ML injection vial Inject 7.5 mg into the skin once a week. THROUGH MANUFACTURING COMPANY ONLY   doxepin  (SINEQUAN ) 10 MG capsule Take 1 capsule (10 mg total) by mouth at bedtime. (Patient not taking: Reported on 03/22/2024)   No facility-administered medications prior to visit.    ROS  Per HPI     Objective:     BP 100/71   Pulse 77   Ht 5' 5 (1.651 m)   Wt 178 lb 0.7 oz (80.8 kg)   LMP  (Approximate)   SpO2 95%   BMI 29.63 kg/m  BP Readings from Last 3 Encounters:  03/22/24 100/71  02/23/24 103/72  08/08/23 108/82   Wt Readings from Last 3 Encounters:  03/22/24 178 lb 0.7 oz (80.8 kg)  02/23/24 179 lb 8 oz (81.4 kg)  08/08/23 188 lb 9.6 oz (85.5 kg)      Physical Exam Constitutional:      General: She is not in acute distress.    Appearance: Normal appearance. She is not ill-appearing or diaphoretic.  HENT:     Head: Normocephalic and atraumatic.     Right Ear: Tympanic membrane, ear canal and external ear normal.     Left Ear: Tympanic membrane, ear canal and external ear normal.     Nose: Nose normal.     Mouth/Throat:     Mouth: Mucous membranes are moist.     Pharynx: Oropharynx is clear.  Eyes:     General: No scleral icterus.       Right eye: No discharge.        Left eye: No discharge.     Extraocular Movements: Extraocular movements intact.     Conjunctiva/sclera: Conjunctivae normal.      Pupils: Pupils are equal, round, and reactive to light.  Neck:     Thyroid: No thyroid mass,  thyromegaly or thyroid tenderness.     Vascular: No carotid bruit.  Cardiovascular:     Rate and Rhythm: Normal rate and regular rhythm.     Pulses: Normal pulses.     Heart sounds: Normal heart sounds. No murmur heard.    No friction rub. No gallop.  Pulmonary:     Effort: Pulmonary effort is normal.     Breath sounds: Normal breath sounds. No wheezing, rhonchi or rales.  Chest:     Chest wall: No tenderness.  Abdominal:     General: Bowel sounds are normal. There is no distension.     Palpations: Abdomen is soft.     Tenderness: There is no abdominal tenderness. There is no guarding.  Musculoskeletal:        General: No swelling, deformity or signs of injury.     Cervical back: Neck supple.     Right lower leg: No edema.     Left lower leg: No edema.  Lymphadenopathy:     Cervical: No cervical adenopathy.     Right cervical: No superficial or posterior cervical adenopathy.    Left cervical: No superficial cervical adenopathy.  Skin:    Coloration: Skin is not jaundiced.     Findings: No rash.  Neurological:     General: No focal deficit present.     Mental Status: She is alert and oriented to person, place, and time.     Motor: No weakness.  Psychiatric:        Mood and Affect: Mood is depressed.        Behavior: Behavior normal.      No results found for any visits on 03/22/24. Last CBC Lab Results  Component Value Date   WBC 6.1 08/04/2023   HGB 14.8 08/04/2023   HCT 42.3 08/04/2023   MCV 89.2 08/04/2023   MCH 31.2 08/04/2023   RDW 13.0 08/04/2023   PLT 224 08/04/2023   Last metabolic panel Lab Results  Component Value Date   GLUCOSE 91 08/04/2023   NA 140 08/04/2023   K 3.8 08/04/2023   CL 101 08/04/2023   CO2 28 08/04/2023   BUN 14 08/04/2023   CREATININE 0.83 08/04/2023   GFRNONAA >60 08/04/2023   CALCIUM 9.7 08/04/2023   PROT 7.0 08/04/2023   ALBUMIN   4.5 08/04/2023   BILITOT 0.3 08/04/2023   ALKPHOS 73 08/04/2023   AST 18 08/04/2023   ALT 13 08/04/2023   ANIONGAP 11 08/04/2023        Assessment & Plan:    Routine Health Maintenance and Physical Exam  Health Maintenance  Topic Date Due   Hepatitis C Screening  Never done   Hepatitis B Vaccine (1 of 3 - 19+ 3-dose series) Never done   DTaP/Tdap/Td vaccine (9 - Td or Tdap) 09/30/2019   COVID-19 Vaccine (2 - Moderna risk series) 07/02/2021   Flu Shot  09/28/2024*   Pap with HPV screening  07/26/2025   Colon Cancer Screening  09/18/2026   HIV Screening  Completed   Pneumococcal Vaccine  Aged Out   HPV Vaccine  Aged Out   Meningitis B Vaccine  Aged Out  *Topic was postponed. The date shown is not the original due date.   Encouraged her to engage in regular exercise appropriate for her age and condition.  Assessment and Plan    Annual physical - Activity as tolerated - Continue dietary efforts - Obtain records for various care gaps. - Assess basic labs including, cbc, cmp, A1c,  and lipid  Chronic pain Severe chronic pain, primarily in the back, significantly impacting quality of life. Concerns about opioid use due to past experiences post-mastectomy. - Proceed with scheduled ablation on October 10th - Continue hydrocodone  as needed for pain, with Zofran  for nausea - Encourage exploration of online therapy options for additional support  Depression Chronic, not at goal. High scores on GAD-7 and PHQ-9 indicating significant depression, likely exacerbated by chronic pain. No current thoughts of self-harm. Current medication is Lexapro , with no adverse effects noted but unclear efficacy. - Conduct gene site testing to guide antidepressant therapy - Continue Lexapro  as current antidepressant - Encourage use of Psychology Today website to find a therapist  Insomnia Chronic insomnia contributing to overall distress. Doxepin  and amitriptyline caused significant tachycardia.  Nortriptyline considered but past effects unknown. - Discontinue doxepin  due to adverse effects - Continue lunesta  3mg  nightly prn    Return in about 3 months (around 06/21/2024) for Mood/sleep follow up.     Romie Tay T Sabre Leonetti, PA-C

## 2024-03-22 NOTE — Telephone Encounter (Signed)
 [employer] testing taking care of.

## 2024-03-22 NOTE — Patient Instructions (Addendum)
 It was nice to see you today!  As we discussed in clinic:  [email or web address]  If you have any problems before your next visit feel free to message me via MyChart (minor issues or questions) or call the office, otherwise you may reach out to schedule an office visit.  Thank you! Lyndee Herbst, PA-C

## 2024-03-23 ENCOUNTER — Ambulatory Visit (HOSPITAL_BASED_OUTPATIENT_CLINIC_OR_DEPARTMENT_OTHER): Payer: Self-pay | Admitting: Student

## 2024-03-23 LAB — COMPREHENSIVE METABOLIC PANEL WITH GFR
ALT: 19 IU/L (ref 0–32)
AST: 18 IU/L (ref 0–40)
Albumin: 4.4 g/dL (ref 3.9–4.9)
Alkaline Phosphatase: 64 IU/L (ref 41–116)
BUN/Creatinine Ratio: 16 (ref 9–23)
BUN: 13 mg/dL (ref 6–24)
Bilirubin Total: 0.4 mg/dL (ref 0.0–1.2)
CO2: 23 mmol/L (ref 20–29)
Calcium: 9.3 mg/dL (ref 8.7–10.2)
Chloride: 103 mmol/L (ref 96–106)
Creatinine, Ser: 0.79 mg/dL (ref 0.57–1.00)
Globulin, Total: 2.1 g/dL (ref 1.5–4.5)
Glucose: 80 mg/dL (ref 70–99)
Potassium: 4.3 mmol/L (ref 3.5–5.2)
Sodium: 139 mmol/L (ref 134–144)
Total Protein: 6.5 g/dL (ref 6.0–8.5)
eGFR: 92 mL/min/1.73 (ref >59)

## 2024-03-23 LAB — CBC WITH DIFFERENTIAL/PLATELET
Basophils Absolute: 0 x10E3/uL (ref 0.0–0.2)
Basos: 1 %
EOS (ABSOLUTE): 0.1 x10E3/uL (ref 0.0–0.4)
Eos: 3 %
Hematocrit: 44.1 % (ref 34.0–46.6)
Hemoglobin: 14.7 g/dL (ref 11.1–15.9)
Immature Grans (Abs): 0 x10E3/uL (ref 0.0–0.1)
Immature Granulocytes: 0 %
Lymphocytes Absolute: 1.6 x10E3/uL (ref 0.7–3.1)
Lymphs: 49 %
MCH: 31.5 pg (ref 26.6–33.0)
MCHC: 33.3 g/dL (ref 31.5–35.7)
MCV: 95 fL (ref 79–97)
Monocytes Absolute: 0.3 x10E3/uL (ref 0.1–0.9)
Monocytes: 8 %
Neutrophils Absolute: 1.3 x10E3/uL — ABNORMAL LOW (ref 1.4–7.0)
Neutrophils: 39 %
Platelets: 206 x10E3/uL (ref 150–450)
RBC: 4.66 x10E6/uL (ref 3.77–5.28)
RDW: 12.7 % (ref 11.7–15.4)
WBC: 3.3 x10E3/uL — ABNORMAL LOW (ref 3.4–10.8)

## 2024-03-23 LAB — LIPID PANEL
Chol/HDL Ratio: 3.4 ratio (ref 0.0–4.4)
Cholesterol, Total: 196 mg/dL (ref 100–199)
HDL: 57 mg/dL (ref >39)
LDL Chol Calc (NIH): 119 mg/dL — ABNORMAL HIGH (ref 0–99)
Triglycerides: 110 mg/dL (ref 0–149)
VLDL Cholesterol Cal: 20 mg/dL (ref 5–40)

## 2024-03-23 LAB — HEMOGLOBIN A1C
Est. average glucose Bld gHb Est-mCnc: 100 mg/dL
Hgb A1c MFr Bld: 5.1 % (ref 4.8–5.6)

## 2024-03-24 ENCOUNTER — Encounter: Payer: Self-pay | Admitting: Hematology and Oncology

## 2024-04-01 ENCOUNTER — Other Ambulatory Visit (HOSPITAL_BASED_OUTPATIENT_CLINIC_OR_DEPARTMENT_OTHER): Payer: Self-pay | Admitting: Student

## 2024-04-01 DIAGNOSIS — F5101 Primary insomnia: Secondary | ICD-10-CM

## 2024-04-08 ENCOUNTER — Other Ambulatory Visit (HOSPITAL_BASED_OUTPATIENT_CLINIC_OR_DEPARTMENT_OTHER): Payer: Self-pay | Admitting: Student

## 2024-04-08 DIAGNOSIS — F332 Major depressive disorder, recurrent severe without psychotic features: Secondary | ICD-10-CM

## 2024-04-08 MED ORDER — VORTIOXETINE HBR 5 MG PO TABS: 5.0000 mg | ORAL_TABLET | Freq: Every day | ORAL | 3 refills | Status: DC

## 2024-04-08 NOTE — Progress Notes (Signed)
 [employer] testing discussed. Starting trintellix and tapering off of lexapro . 5 mg for a week before stopping and will start 5 mg of trintellix while tapering.

## 2024-04-09 HISTORY — PX: OTHER SURGICAL HISTORY: SHX169

## 2024-04-14 ENCOUNTER — Encounter (HOSPITAL_BASED_OUTPATIENT_CLINIC_OR_DEPARTMENT_OTHER): Payer: Self-pay | Admitting: Student

## 2024-04-15 ENCOUNTER — Encounter (HOSPITAL_BASED_OUTPATIENT_CLINIC_OR_DEPARTMENT_OTHER): Payer: Self-pay

## 2024-04-20 ENCOUNTER — Ambulatory Visit: Payer: Self-pay

## 2024-04-20 ENCOUNTER — Telehealth (HOSPITAL_BASED_OUTPATIENT_CLINIC_OR_DEPARTMENT_OTHER): Payer: Self-pay

## 2024-04-20 NOTE — Telephone Encounter (Signed)
 Pt said fevers are ranging 100.8-101.5. Not 108 as written by E2C2. Pt said she took a COVID test today and it was negative. Dr. Bonner said if fevers kept coming back they could have PCP order labs. I advised pt we do not have the capacity to do a blood culture here.

## 2024-04-20 NOTE — Telephone Encounter (Signed)
 FYI Only or Action Required?: Action required by provider: clinical question for provider.  Patient was last seen in primary care on 03/22/2024 by Rothfuss, Lang DASEN, PA-C.  Called Nurse Triage reporting Fever.  Symptoms began today.  Interventions attempted: OTC medications: tylenol  shortly before call to triage RN.  Symptoms are: gradually worsening.  Triage Disposition: See HCP Within 4 Hours (Or PCP Triage)  Patient/caregiver understands and will follow disposition?: No, wishes to speak with PCP   PT is requesting labs to be ordered.    Copied from CRM [id number]. Topic: Clinical - Red Word Triage >> Apr 20, 2024  2:33 PM Deaijah H wrote: Red Word that prompted transfer to Nurse Triage: Fever was 108 now 101.5 . Would like to know how another Dr can send orders to Dr. Iven due to fever coming back. Reason for Disposition  [1] Fever > 100.4 F (38.0 C) AND [2] surgery in the last month  Answer Assessment - Initial Assessment Questions Ablation on 10/10 mtw after that low grade fever in the afternoons  Did test for covid and it was negative. Denies any other symptoms. She also messaged Dr. Bonner asking to get labs ordered. She is not wanting an appt. She is just wanting the labs drawn.      1. TEMPERATURE: What is the most recent temperature?  How was it measured?      100.8 to now 101.5 2. ONSET: When did the fever start?       She was having them low grade in the afternoons post op day 3-5 then nothing. Then today about 2 pm she felt flushed and checked her temperature and it was elevated.  3. CHILLS: Do you have chills? If yes: How bad are they?  (e.g., none, mild, moderate, severe)     Chills since the fever started 4. OTHER SYMPTOMS: Do you have any other symptoms besides the fever?  (e.g., abdomen pain, cough, diarrhea, earache, headache, sore throat, urination pain)     Feels crummy but no other symptoms.  5. CAUSE: If there are no symptoms, ask: What  do you think is causing the fever?      Ablation about 11 days ago 6. CONTACTS: Does anyone else in the family have an infection?     unknown 7. TREATMENT: What have you done so far to treat this fever? (e.g., OTC fever medicines)     Tylenol   Protocols used: [facility]

## 2024-04-20 NOTE — Telephone Encounter (Signed)
 Pt advised and voices understanding.  Pt wanted Lang to be informed that she is very disappointed due to the fact that he is not willing to order her labs. Said he can see Dr. Lela requested labs. I advised the surgeon should be ordering them.

## 2024-04-30 HISTORY — PX: OTHER SURGICAL HISTORY: SHX169

## 2024-05-06 ENCOUNTER — Other Ambulatory Visit (HOSPITAL_BASED_OUTPATIENT_CLINIC_OR_DEPARTMENT_OTHER): Payer: Self-pay | Admitting: Student

## 2024-05-06 ENCOUNTER — Other Ambulatory Visit (HOSPITAL_BASED_OUTPATIENT_CLINIC_OR_DEPARTMENT_OTHER): Payer: Self-pay

## 2024-05-06 DIAGNOSIS — F5101 Primary insomnia: Secondary | ICD-10-CM

## 2024-05-06 MED ORDER — ESZOPICLONE 3 MG PO TABS: 3.0000 mg | ORAL_TABLET | Freq: Every day | ORAL | 0 refills | Status: DC

## 2024-05-06 NOTE — Telephone Encounter (Signed)
 Prevo is requesting refill for lunesta  3mg . Per OV dated09/22/2025:   Return in about 3 months (around 06/21/2024) for Mood/sleep follow up.

## 2024-05-14 ENCOUNTER — Telehealth (HOSPITAL_BASED_OUTPATIENT_CLINIC_OR_DEPARTMENT_OTHER): Payer: Self-pay | Admitting: Student

## 2024-05-14 NOTE — Telephone Encounter (Signed)
 Copied from CRM #[id number]. Topic: General - Other >> May 14, 2024 12:51 PM Hadassah PARAS wrote: Reason for CRM: Pt had geneti testing ordered but  insurance is not covering due to putting wrong diagnoisis code in. Pt would like PCP to change code and resent to insurance for approval. Please advise [phone]

## 2024-05-17 NOTE — Telephone Encounter (Signed)
 LMTCB. Need advise for diagnosis code.

## 2024-05-18 ENCOUNTER — Telehealth (HOSPITAL_BASED_OUTPATIENT_CLINIC_OR_DEPARTMENT_OTHER): Payer: Self-pay

## 2024-05-18 NOTE — Telephone Encounter (Signed)
 Spoke to [place] our new [employer]. He will be contacting customer service and reach back out to us .

## 2024-05-18 NOTE — Telephone Encounter (Signed)
 Copied from CRM [id number]. Topic: General - Other >> May 18, 2024  9:25 AM Kevelyn M wrote: Reason for CRM: Megan with [employer] [facility] returning Fenna Semel's call.  Call back: [phone]

## 2024-05-19 ENCOUNTER — Telehealth (HOSPITAL_BASED_OUTPATIENT_CLINIC_OR_DEPARTMENT_OTHER): Payer: Self-pay | Admitting: Student

## 2024-05-19 NOTE — Telephone Encounter (Signed)
 Copied from CRM [id number]. Topic: General - Other >> May 18, 2024  3:50 PM [facility] wrote: Reason for CRM: Duwaine from [employer] called to speak with Nurse Holmes. I did reach out to CAL 2 times and was unable to speak to anyone. Duwaine is requesting a call back.   Megan- [phone]

## 2024-05-20 ENCOUNTER — Telehealth (HOSPITAL_BASED_OUTPATIENT_CLINIC_OR_DEPARTMENT_OTHER): Payer: Self-pay | Admitting: Student

## 2024-05-20 NOTE — Telephone Encounter (Signed)
 Copied from CRM [id number]. Topic: General - Other >> May 20, 2024  2:10 PM Hadassah PARAS wrote: Reason for CRM: Pt is following up on a change of diagnosis code. Blood work has been peformed, $5500, and insurance is not covering it due to diagnosis code. Pt has been following up with this since the 14th of this month. Pt is requesting to speak with a development worker, international aid. Please advise #[phone] >> May 20, 2024  2:24 PM Antwanette L wrote:  Patient called back to discuss an incorrect diagnostic code submitted by the provider for recent blood work.The test has already been completed and cost approximately $5,500. The insurance provider is not covering the charges due to the coding issue. Patient is requesting a callback from the office manager.Patient can be contacted at [phone]

## 2024-05-20 NOTE — Telephone Encounter (Signed)
 Copied from CRM [id number]. Topic: General - Other >> May 20, 2024  2:07 PM Charlet HERO wrote: Reason for CRM: [facility] [phone] is calling to get asst with a referral he is stating that the diagnosis code is wrong and would like to have a corrected code to the lab for the code can be accepted by the insurance the right code can be found on [email or web address] needs to be sent to the lab and then they will send it to the insurance.

## 2024-05-21 NOTE — Telephone Encounter (Signed)
 Patient called in again very upset and Orie let her know Sari is working on this and she willl get a response once [employer] and [employer] let us  know how to proceed with diagnosis. We are working on the issue at this time.

## 2024-05-21 NOTE — Telephone Encounter (Signed)
 reference number for Sherleen is [id number]

## 2024-05-24 ENCOUNTER — Telehealth (HOSPITAL_BASED_OUTPATIENT_CLINIC_OR_DEPARTMENT_OTHER): Payer: Self-pay

## 2024-05-24 NOTE — Telephone Encounter (Signed)
 Spoke with patient in length after speaking with [employer] and [employer].   Sherleen states this is not a covered product and patient is responsible. Agent Von R. Ref#[id number]. [employer] states patient portion is $330 for the testing done at [facility]  Patient advised provider Lang Rothfuss stated that she had failed multiple treatment options. She did note that he told her it could be up to $330 but he felt she may be covered due to the failed treatments. She stated that she did sign consent forms based on what the provider said and added if she were being transparent she would have still paid $330 for the test. She states the staff and provider workflow needs to change in regards to this financial responsibility.   DOS 03/22/2024  She notes she only received a phone call from [employer] that she owed the money and states she has never received an actual bill from them even though they have mailed to her 2 times at the verified address.  Patient also discussed how Heather C. In the Triage RN pool noted chart she went over adult fever protocol during triage and never said a word to her but noted in chart 03/2024. This was after Carlie PARAS. And  provider Rothfuss would not order labs that the physician at [facility] was to order if she continued with fever after recent surgery. Both instances were negative interactions with [facility] due to being told different things and just wanting help.   Will follow up with [employer] to see if they can help in any way with the bill. Patient advised of followup and agrees.

## 2024-05-24 NOTE — Telephone Encounter (Signed)
 Spoke to [place] with billing. They said the pt's responsibility is $330.00. She stated the order is a non covered product and there was no billing or coding issues. She said they spoke to [facility] and explained this to him last time. Our manager has been informed and will relay this to the pt.

## 2024-05-26 ENCOUNTER — Encounter (HOSPITAL_BASED_OUTPATIENT_CLINIC_OR_DEPARTMENT_OTHER): Payer: Self-pay

## 2024-05-26 NOTE — Telephone Encounter (Signed)
 Copied from CRM [id number]. Topic: General - Billing Inquiry >> May 26, 2024 10:08 AM Shanda MATSU wrote: Reason for CRM: Patient called in req to speak with practice manager, Clotilda, patient stated Clotilda was supposed to call her back but has not, this is in regards to incorrect diagnosis code.    Followed up with patient via mychart message. Please see notes.

## 2024-06-02 ENCOUNTER — Telehealth (HOSPITAL_BASED_OUTPATIENT_CLINIC_OR_DEPARTMENT_OTHER): Payer: Self-pay | Admitting: Student

## 2024-06-02 ENCOUNTER — Telehealth (HOSPITAL_BASED_OUTPATIENT_CLINIC_OR_DEPARTMENT_OTHER): Payer: Self-pay

## 2024-06-02 NOTE — Telephone Encounter (Signed)
 Source  Holly Wiley (Patient)   Subject  Holly Wiley (Patient)   Topic  General - Call Back - No Documentation    Communication  Reason for CRM: Patient returning a call from [facility], she would like for her to reach back out. [phone]. She wants her to call after 12:30.

## 2024-06-02 NOTE — Telephone Encounter (Signed)
 Copied from CRM #[id number]. Topic: General - Call Back - No Documentation >> Jun 02, 2024  9:43 AM Mercedes MATSU wrote: Reason for CRM: Patient called in stating that she called prior to speak with the office manager Clotilda. She said that she has yet to call her back and she wants to know why she has not yet. Patient is upset and did not explain further. She is requesting a call back from the office manager and can be reached at [phone].   Spoke with the patient and call her back with her concerns in regards to followup and [employer]. Patient did not realize the mychart messsage came from [facility] as it has legal name Jodi on it. She is good with the outcome of the call and mychart message.  No further information provided outside of cost of [employer] and misunderstanding of who was responding by [employer].

## 2024-06-02 NOTE — Telephone Encounter (Signed)
 Copied from CRM #[id number]. Topic: General - Call Back - No Documentation >> Jun 02, 2024  9:43 AM Holly Wiley wrote: Reason for CRM: Patient called in stating that she called prior to speak with the office manager Clotilda. She said that she has yet to call her back and she wants to know why she has not yet. Patient is upset and did not explain further. She is requesting a call back from the office manager and can be reached at [phone].   06/02/24 10:05am again reached out to patient and left a detailed message on voicemail. I have tried calling the patient multiple times with no luck. I sent my chart message with results of billing for [facility]. Please see mychart message.

## 2024-06-02 NOTE — Telephone Encounter (Signed)
 Copied from CRM #[id number]. Topic: General - Call Back - No Documentation >> Jun 02, 2024  9:43 AM Mercedes MATSU wrote: Reason for CRM: Patient called in stating that she called prior to speak with the office manager Clotilda. She said that she has yet to call her back and she wants to know why she has not yet. Patient is upset and did not explain further. She is requesting a call back from the office manager and can be reached at [phone].   06/02/24 1006am left a detailed message on patients voicemail as a follow up to [place] message and phone call as patient requested. See encounters from previous outreach.  -Good morning, I am following up on the [facility] inquiry. They state they are giving you the best rate and have payment plans if you need it. Please reach out to them if you have questions. Per Lang Rothfuss discussions he stated this was the best option for treatment as you have tried mulitple treatments before. He did state that you both discussed max amount out of pocket was $330 at the time of service and in no way expressed that your insurance plan would cover the cost. Please reach out if you have any further questions regarding next steps with Lang Rothfuss, PA   Have a good rest of your day!

## 2024-06-08 ENCOUNTER — Encounter (HOSPITAL_BASED_OUTPATIENT_CLINIC_OR_DEPARTMENT_OTHER): Payer: Self-pay | Admitting: Student

## 2024-06-08 ENCOUNTER — Ambulatory Visit (INDEPENDENT_AMBULATORY_CARE_PROVIDER_SITE_OTHER): Admitting: Student

## 2024-06-08 VITALS — BP 114/72 | HR 71 | Temp 97.9°F | Resp 16 | Ht 65.0 in | Wt 190.8 lb

## 2024-06-08 DIAGNOSIS — Z23 Encounter for immunization: Secondary | ICD-10-CM

## 2024-06-08 DIAGNOSIS — F332 Major depressive disorder, recurrent severe without psychotic features: Secondary | ICD-10-CM

## 2024-06-08 DIAGNOSIS — F5101 Primary insomnia: Secondary | ICD-10-CM

## 2024-06-08 DIAGNOSIS — E6609 Other obesity due to excess calories: Secondary | ICD-10-CM

## 2024-06-08 MED ORDER — VORTIOXETINE HBR 10 MG PO TABS: 10.0000 mg | ORAL_TABLET | Freq: Every day | ORAL | 6 refills | Status: AC

## 2024-06-08 MED ORDER — ESZOPICLONE 3 MG PO TABS: 3.0000 mg | ORAL_TABLET | Freq: Every day | ORAL | 0 refills | Status: DC

## 2024-06-08 NOTE — Progress Notes (Unsigned)
 Established Patient Office Visit  Subjective   Patient ID: Holly Wiley, female    DOB: [birth year]  Age: 48 y.o. MRN: [id number]  Chief Complaint  Patient presents with   Medical Management of Chronic Issues    Follow up.     HPI  Discussed the use of AI scribe software for clinical note transcription with the patient, who gave verbal consent to proceed.  History of Present Illness   Holly Wiley is a 48 year old female who presents for a follow-up on Trintellix  and management of chronic pain.  She has not experienced any improvement since starting Trintellix , attributing this to multiple stressors including her nephew's illness, a recent back procedure, and a total shoulder replacement four weeks ago. She experiences constant pain and insomnia, significantly impacting her ability to exercise and perform daily activities. Her current physical activity is minimal, with standing for a shower being an improvement.  She underwent an ablation on her lower back, which provided no relief despite prior diagnostic injections offering significant temporary relief. An MRI of her lumbar spine is scheduled for this Friday. She is currently in physical therapy for her shoulder. She reports that after a previous repair in June, she was found to have a complete rotator cuff tear, which she was unaware of at the time. She was unaware of the tear due to her high pain tolerance and distraction from other pain sources.  She is unable to take opioids long-term and has been prescribed hydrocodone  and oxycodone  post-surgery, but uses them sparingly due to side effects. She has Zofran  for nausea. She still has some hydrocodone  left from a previous prescription and a few oxycodone  tablets remaining.  She has been seeing a therapist, Barnie Nicely, and recently started cognitive processing therapy, attending sessions twice a week. She has difficulty with concentration and reading due to pain, often needing  to reread paragraphs multiple times.  She has a family history of diabetes, with several relatives affected, but her A1c remains at 5.1. She follows a strict diet with minimal carbs and no fried foods, but her weight loss has been hindered by her low activity level. She previously used Mounjaro for weight management but discontinued it due to cost.  No side effects from Trintellix , aside from pre-existing symptoms. She experiences significant sleep disturbances, often waking up early and unable to nap during the day.         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   Chronic pain syndrome 08/13/2019   Acquired absence of breast and absent nipple, 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   Brachial plexopathy 03/22/2019   Wound infection after surgery 11/28/2018   Breast cancer (HCC) 11/26/2018   Postoperative wound infection 11/26/2018   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   Genetic testing 08/20/2018   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   Dehydration 07/24/2018   Nausea and vomiting 07/24/2018   Nausea without vomiting 07/24/2018   Family history of breast cancer  Family history of colon cancer    Family history of bladder cancer    Family history of pancreatic cancer    Port-A-Cath in place 07/16/2018   Malignant neoplasm of upper-outer quadrant of right breast in female, 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/29/2018   Impingement syndrome of right shoulder region 06/10/2018   Aftercare following right elbow joint replacement surgery  02/27/2017   Pain in joint of left shoulder 05/19/2014   Macromastia 11/19/2013   Breast mass, right 10/20/2013   History of seizure 2014   Abnormal Pap smear 01/01/2012   Past Medical History:  Diagnosis Date   Acquired absence of breast and absent nipple, 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   Breast mass, right 10/20/2013   Cervical spondylosis 06/12/2021   Chronic pain syndrome 08/13/2019   Dehydration 07/24/2018   Dental crown present    Dysrhythmia, cardiac 07/23/2021   Family history of bladder cancer    Family history of breast cancer    Family history of colon cancer    Family history of pancreatic cancer    Genetic testing 08/20/2018   [employer] + [employer] was ordered (80 genes): AIP, ALK, APC*,ATM*, AXIN2, BAP1, BARD1, BLM, BMPR1A, BRCA1*, BRCA2*, BRIP1*, CDC73, CDH1*, CDK4, CDKN1B, CDKN2A, CHEK2*, CTNNA1,DICER1, FANCC, FH, FLCN, GALNT12, HOXB13, KIT, MAX, MEN1, MET, MLH1*, MRE11A, MSH2*, MSH3, MSH6*, MUTYH*, NBN, NF1*,NF2, NTHL1, PALB2*, PDGFRA, PHOX2B, PMS2*, POLD1, POLE, POT1, PRKAR1A, PTCH1, PTEN*, RAD50, RAD51C*,   Heel cord tightness, right 11/10/2020   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   Impingement syndrome of right shoulder region 06/10/2018   Insomnia 06/19/2022   Macromastia 11/19/2013   Malignant neoplasm of upper-outer quadrant of right breast in female, 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/29/2018   Migraines    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), heterozygous     Nausea and vomiting 07/24/2018   Nausea without vomiting 07/24/2018   Neck pain  05/26/2021   Pain in joint of left shoulder 05/19/2014   Plantar fasciitis 11/10/2020   PONV (postoperative nausea and vomiting)    Port-A-Cath in place 07/16/2018   Postoperative wound infection 11/26/2018   Wound infection after surgery 11/28/2018   Social History   Tobacco Use   Smoking status: Never    Passive exposure: Never   Smokeless tobacco: Never  Vaping Use   Vaping status: Never Used  Substance Use Topics   Alcohol use: Not Currently    Comment: been a while   Drug use: No   Allergies  Allergen Reactions   Bee Pollen Anaphylaxis   Bee Venom Anaphylaxis   Dihydroergotamine Anaphylaxis   Imitrex [Sumatriptan] Other (See Comments)    SEIZURE-LIKE ACTIVITY   Metoclopramide  Other (See Comments)    TACHYCARDIA   Transderm-Scop [Scopolamine ] Other (See Comments)    AGITATION      ROS Per HPI.    Objective:     BP 114/72   Pulse 71   Temp 97.9 F (36.6 C) (Oral)   Resp 16   Ht 5' 5 (1.651 m)   Wt 190 lb 12.8 oz (86.5 kg)   SpO2 96%   BMI 31.75 kg/m  BP Readings from Last 3 Encounters:  06/08/24 114/72  03/22/24 100/71  02/23/24 103/72   Wt Readings from Last 3 Encounters:  06/08/24 190 lb 12.8 oz (86.5 kg)  03/22/24 178 lb 0.7 oz (80.8 kg)  02/23/24 179 lb 8 oz (81.4 kg)   SpO2 Readings from Last 3 Encounters:  06/08/24 96%  03/22/24 95%  02/23/24 97%      Physical Exam Constitutional:      General: She is not in acute distress.    Appearance: Normal appearance. She is not ill-appearing.     Comments: Appears to be in pain today  HENT:     Head: Normocephalic and atraumatic.     Nose: Nose normal.  Eyes:     General: No scleral icterus.    Conjunctiva/sclera: Conjunctivae normal.  Cardiovascular:     Rate and Rhythm: Normal rate and regular rhythm.     Heart sounds: Normal heart sounds. No murmur heard.    No friction rub.  Pulmonary:     Effort: Pulmonary effort is normal. No respiratory distress.     Breath sounds: Normal  breath sounds. No wheezing, rhonchi or rales.  Musculoskeletal:        General: Normal range of motion.  Skin:    General: Skin is warm and dry.     Coloration: Skin is not jaundiced or pale.  Neurological:     General: No focal deficit present.     Mental Status: She is alert.  Psychiatric:        Mood and Affect: Mood normal.        Behavior: Behavior normal.      No results found for any visits on 06/08/24.  Last CBC Lab Results  Component Value Date   WBC 3.3 (L) 03/22/2024   HGB 14.7 03/22/2024   HCT 44.1 03/22/2024   MCV 95 03/22/2024   MCH 31.5 03/22/2024   RDW 12.7 03/22/2024   PLT 206 03/22/2024   Last metabolic panel Lab Results  Component Value Date   GLUCOSE 80 03/22/2024   NA 139 03/22/2024   K 4.3 03/22/2024   CL 103 03/22/2024   CO2 23 03/22/2024   BUN 13 03/22/2024   CREATININE 0.79 03/22/2024   EGFR 92 03/22/2024   CALCIUM 9.3 03/22/2024   PROT 6.5 03/22/2024   ALBUMIN  4.4 03/22/2024   LABGLOB 2.1 03/22/2024   BILITOT 0.4 03/22/2024   ALKPHOS 64 03/22/2024   AST 18 03/22/2024   ALT 19 03/22/2024   ANIONGAP 11 08/04/2023   Last lipids Lab Results  Component Value Date   CHOL 196 03/22/2024   HDL 57 03/22/2024   LDLCALC 119 (H) 03/22/2024   TRIG 110 03/22/2024   CHOLHDL 3.4 03/22/2024   Last hemoglobin A1c Lab Results  Component Value Date   HGBA1C 5.1 03/22/2024      The 10-year ASCVD risk score (Arnett DK, et al., 2019) is: 0.8%    Assessment & Plan:   Assessment and Plan    Major depressive disorder, recurrent, severe, without psychotic features Currently on Trintellix  at the lowest dose with no noticeable improvement. Depression score decreased from 27 to 19, but she feels the decrease is not accurate. Engaged in cognitive processing therapy (CPT) with a therapist, which may take 12-18 sessions. No side effects reported from Trintellix . - Increased Trintellix  to 10 mg daily. - Continue cognitive processing therapy. - Will  reassess in 6 weeks via message.  Primary insomnia Reports difficulty sleeping, often waking up  early and unable to nap. Currently using Lunesta  for sleep management. - Refilled Lunesta  prescription.  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 Recent total shoulder replacement and ongoing physical therapy. Ablation on lower back was ineffective. MRI of lumbar spine scheduled for further evaluation. Pain management includes hydrocodone  and oxycodone , with limited tolerance due to side effects. Engaged in physical therapy and considering behavioral approaches for pain management. - Proceed with MRI of lumbar spine. - Continue physical therapy for shoulder. - Continue current pain management regimen with hydrocodone  or oxycodone  as needed.  Class 1 obesity Previously on Zepbound for weight management but discontinued due to cost. Discussed alternative weight loss medications and referral to a weight management center for personalized diet and exercise plan. Insurance does not cover weight loss medications, but referral to a weight management center is planned. - Referred to weight management center in [place] for personalized diet and exercise plan.  General health maintenance Uncertain if TDAP vaccination was received since 2011. Plans to verify vaccination status and obtain TDAP if needed. - Verify TDAP vaccination status and obtain TDAP if needed.      Return in about 6 months (around 12/07/2024) for Chronic Followup.    Louellen Haldeman T Georges Victorio, PA-C

## 2024-06-08 NOTE — Patient Instructions (Addendum)
 It was nice to see you today!  As we discussed in clinic: - Please send us  a copy of your work vaccines when you time.   If you have any problems before your next visit feel free to message me via MyChart (minor issues or questions) or call the office, otherwise you may reach out to schedule an office visit.  Thank you! Tiwanda Threats, PA-C

## 2024-06-21 ENCOUNTER — Ambulatory Visit (HOSPITAL_BASED_OUTPATIENT_CLINIC_OR_DEPARTMENT_OTHER): Admitting: Student

## 2024-06-30 ENCOUNTER — Telehealth: Payer: Self-pay

## 2024-06-30 NOTE — Telephone Encounter (Signed)
 Pt called to ask about her MRI PA being denied d/t no evidence of mastectomies. She will need to r/s her MD f/u.  Message sent to our PA tech asking to send clinicals proving pt has had a double mastectomy. Pt r/s her MRI for 1/30 and we r/s her MD visit for 2/5. No other concerns at this time.

## 2024-07-02 ENCOUNTER — Other Ambulatory Visit: Payer: 59

## 2024-07-02 ENCOUNTER — Encounter (HOSPITAL_BASED_OUTPATIENT_CLINIC_OR_DEPARTMENT_OTHER): Payer: Self-pay | Admitting: Student

## 2024-07-05 ENCOUNTER — Ambulatory Visit: Payer: Managed Care, Other (non HMO) | Admitting: Hematology and Oncology

## 2024-07-05 ENCOUNTER — Other Ambulatory Visit: Payer: Managed Care, Other (non HMO)

## 2024-07-06 ENCOUNTER — Telehealth: Payer: Self-pay

## 2024-07-06 NOTE — Telephone Encounter (Signed)
 Copied from CRM [id number]. Topic: Clinical - Medical Advice >> Jul 06, 2024  2:24 PM Kevelyn M wrote: Reason for CRM: Patient's Therapist would like to speak with Lang because she thinks [place] needs a psychiatrist. Patient has had a hard time getting a specialist. She has a break until 4pm.  Call back # [phone]

## 2024-07-07 ENCOUNTER — Encounter (HOSPITAL_BASED_OUTPATIENT_CLINIC_OR_DEPARTMENT_OTHER): Payer: Self-pay | Admitting: Student

## 2024-07-07 ENCOUNTER — Encounter: Payer: Self-pay | Admitting: Hematology and Oncology

## 2024-07-07 ENCOUNTER — Ambulatory Visit (HOSPITAL_BASED_OUTPATIENT_CLINIC_OR_DEPARTMENT_OTHER): Admitting: Student

## 2024-07-07 VITALS — BP 108/73 | HR 76 | Temp 98.2°F | Resp 16 | Ht 65.0 in | Wt 193.7 lb

## 2024-07-07 DIAGNOSIS — F332 Major depressive disorder, recurrent severe without psychotic features: Secondary | ICD-10-CM | POA: Diagnosis not present

## 2024-07-07 DIAGNOSIS — Z23 Encounter for immunization: Secondary | ICD-10-CM

## 2024-07-07 DIAGNOSIS — F5101 Primary insomnia: Secondary | ICD-10-CM | POA: Diagnosis not present

## 2024-07-07 MED ORDER — ESZOPICLONE 3 MG PO TABS: 3.0000 mg | ORAL_TABLET | Freq: Every day | ORAL | 0 refills | Status: AC

## 2024-07-07 MED ORDER — MIRTAZAPINE 7.5 MG PO TABS: 7.5000 mg | ORAL_TABLET | Freq: Every day | ORAL | 2 refills | Status: AC

## 2024-07-07 NOTE — Patient Instructions (Signed)
 It was nice to see you today!  As we discussed in clinic:  [facility]  Phone [phone]  Address [address] [place], [place] [address]  Hours Open 24/7. No appointment required.  If you have any problems before your next visit feel free to message me via MyChart (minor issues or questions) or call the office, otherwise you may reach out to schedule an office visit.  Thank you! Lillia Lengel, PA-C

## 2024-07-07 NOTE — Progress Notes (Signed)
 "  Established Patient Office Visit  Subjective   Patient ID: Holly Wiley, female    DOB: [birth year]  Age: 49 y.o. MRN: [id number]  Chief Complaint  Patient presents with   Medication Problem    Needs to discuss some medicine.     HPI  Discussed the use of AI scribe software for clinical note transcription with the patient, who gave verbal consent to proceed.  History of Present Illness   Holly Wiley is a 49 year old female with anxiety and depression who presents for medication management.  She experiences ongoing anxiety and depression, which are exacerbated by chronic physical pain. She feels significant emotional distress due to complicated grief and the limitations imposed by her pain. No suicidal ideation or plans. Her current medication regimen includes Trintellix , which she started at 5 mg and increased to 10 mg. She has previously tried trazodone and doxepin  for sleep and anxiety management.  She reports chronic pain following past mastectomies, complications, and chemotherapy. The pain significantly restricts her physical activity, making it difficult to engage in activities that could alleviate her depression and anxiety. Simple tasks like grocery shopping require her to lean on a buggy for support. She is scheduled to see a neurosurgeon on January 20th to explore treatment options for her pain.  She experiences insomnia, which is managed with Lunesta  3 mg nightly.  Without her current regimen, her sleep is reduced to one or two hours per night. She has tried various sleep aids, including trazodone and doxepin , but finds her current regimen more effective.  She is concerned about weight gain due to physical inactivity and has explored options for weight management, including semaglutide.  Her current medications include hydrocodone , which she uses sparingly. She is cautious about medication use and emphasizes her low risk for misuse.      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   Chronic pain syndrome 08/13/2019   Acquired absence of breast and absent nipple, 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   Brachial plexopathy 03/22/2019   Wound infection after surgery 11/28/2018   Breast cancer (HCC) 11/26/2018   Postoperative wound infection 11/26/2018   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   Genetic testing 08/20/2018   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   Dehydration 07/24/2018   Nausea and vomiting 07/24/2018   Nausea without vomiting 07/24/2018   Family history of breast cancer    Family history of colon cancer    Family history of bladder cancer    Family history of pancreatic cancer    Port-A-Cath in place 07/16/2018   Malignant neoplasm of upper-outer quadrant of right breast in female, 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/29/2018   Impingement syndrome of right shoulder region 06/10/2018   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   Breast mass, right 10/20/2013   Cervical  spondylosis 06/12/2021   Chronic pain syndrome 08/13/2019   Dehydration 07/24/2018   Dental crown present    Dysrhythmia, cardiac 07/23/2021   Family history of bladder cancer    Family history of breast cancer    Family history of colon cancer    Family history of pancreatic cancer    Genetic testing 08/20/2018   [employer] was ordered (80 genes): AIP, ALK, APC*,ATM*, AXIN2, BAP1, BARD1, BLM, BMPR1A, BRCA1*, BRCA2*, BRIP1*, CDC73, CDH1*, CDK4, CDKN1B, CDKN2A, CHEK2*, CTNNA1,DICER1, FANCC, FH, FLCN, GALNT12, HOXB13, KIT, MAX, MEN1, MET, MLH1*, MRE11A, MSH2*, MSH3, MSH6*, MUTYH*, NBN, NF1*,NF2, NTHL1, PALB2*, PDGFRA, PHOX2B, PMS2*, POLD1, POLE, POT1, PRKAR1A, PTCH1, PTEN*, RAD50, RAD51C*,   Heel cord tightness, right 11/10/2020   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   Impingement syndrome of right shoulder region 06/10/2018   Insomnia 06/19/2022   Macromastia 11/19/2013   Malignant neoplasm of upper-outer quadrant of right breast in female, 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    Objective:     BP 108/73   Pulse 76   Temp 98.2 F (36.8 C) (Oral)   Resp 16   Ht 5' 5 (1.651 m)   Wt 193 lb 11.2 oz (87.9 kg)   SpO2 96%   BMI 32.23 kg/m  BP Readings from Last 3 Encounters:  07/07/24 108/73  06/08/24 114/72  03/22/24 100/71   Wt Readings from Last 3 Encounters:  07/07/24 193 lb 11.2 oz (87.9 kg)  06/08/24 190 lb 12.8 oz (86.5 kg)   03/22/24 178 lb 0.7 oz (80.8 kg)   SpO2 Readings from Last 3 Encounters:  07/07/24 96%  06/08/24 96%  03/22/24 95%      Physical Exam Constitutional:      General: She is not in acute distress.    Appearance: Normal appearance. She is not ill-appearing.  HENT:     Head: Normocephalic and atraumatic.     Nose: Nose normal.  Eyes:     General: No scleral icterus.    Conjunctiva/sclera: Conjunctivae normal.  Cardiovascular:     Rate and Rhythm: Normal rate and regular rhythm.     Heart sounds: Normal heart sounds. No murmur heard.    No friction rub.  Pulmonary:     Effort: Pulmonary effort is normal. No respiratory distress.     Breath sounds: Normal breath sounds. No wheezing, rhonchi or rales.  Musculoskeletal:        General: Normal range of motion.  Skin:    General: Skin is warm and dry.     Coloration: Skin is not jaundiced or pale.  Neurological:     General: No focal deficit present.  Mental Status: She is alert.  Psychiatric:        Mood and Affect: Mood is anxious and depressed. Affect is not blunt, flat, angry or tearful.        Behavior: Behavior normal. Behavior is not aggressive, withdrawn, hyperactive or combative.        Thought Content: Thought content does not include suicidal ideation. Thought content does not include suicidal plan.      No results found for any visits on 07/07/24.  Last CBC Lab Results  Component Value Date   WBC 3.3 (L) 03/22/2024   HGB 14.7 03/22/2024   HCT 44.1 03/22/2024   MCV 95 03/22/2024   MCH 31.5 03/22/2024   RDW 12.7 03/22/2024   PLT 206 03/22/2024   Last metabolic panel Lab Results  Component Value Date   GLUCOSE 80 03/22/2024   NA 139 03/22/2024   K 4.3 03/22/2024   CL 103 03/22/2024   CO2 23 03/22/2024   BUN 13 03/22/2024   CREATININE 0.79 03/22/2024   EGFR 92 03/22/2024   CALCIUM 9.3 03/22/2024   PROT 6.5 03/22/2024   ALBUMIN  4.4 03/22/2024   LABGLOB 2.1 03/22/2024   BILITOT 0.4 03/22/2024    ALKPHOS 64 03/22/2024   AST 18 03/22/2024   ALT 19 03/22/2024   ANIONGAP 11 08/04/2023   Last lipids Lab Results  Component Value Date   CHOL 196 03/22/2024   HDL 57 03/22/2024   LDLCALC 119 (H) 03/22/2024   TRIG 110 03/22/2024   CHOLHDL 3.4 03/22/2024   Last hemoglobin A1c Lab Results  Component Value Date   HGBA1C 5.1 03/22/2024      The 10-year ASCVD risk score (Arnett DK, et al., 2019) is: 0.7%    Assessment & Plan:   Assessment and Plan    Major depressive disorder, recurrent severe Recurrent severe major depressive disorder with significant impact on daily functioning.  Denies suicidal ideation or plans, but severe emotional distress due to chronic pain and physical limitations. Current treatment with Trintellix  at maintenance dose of 10 mg. Considering adjunctive therapy with mirtazapine  for depression and anxiety. Discussed potential benefits of Vraylar for refractory depression, but she prefers to avoid additional medications. Behavioral health urgent care discussed as an option for urgent needs before March 30th appointment. - Prescribed mirtazapine  7.5 mg for adjunctive therapy, to be taken nightly. - Instructed to monitor for signs of serotonin syndrome. - Advised to visit behavioral health urgent care if symptoms do not improve in two weeks.  Primary insomnia Chronic insomnia with significant impact on sleep quality. Current treatment with Lunesta , which is effective but she wishes to reduce usage. Mirtazapine  may aid in sleep improvement, potentially allowing for decreased Lunesta  use. - Prescribed mirtazapine  7.5 mg to be taken at bedtime to aid sleep. - Monitor sleep quality and adjust Lunesta  usage as needed.  Chronic pain, multifactorial Chronic multifactorial pain significantly impacting daily activities and contributing to emotional distress. Current management includes gabapentin  and hydrocodone . She is scheduled for a neurosurgical consultation on January  20th to explore potential interventions. Discussed potential weight loss strategies to alleviate pain, including compounded semaglutide and microdosing options. - Attend neurosurgical consultation on January 20th. - Explore weight loss options, including compounded semaglutide and microdosing strategies.  Obesity Contributing to physical limitations and chronic pain. She is exploring weight loss options, including compounded semaglutide and microdosing strategies. Insurance does not cover weight loss medications or clinics. - Explore compounded semaglutide and microdosing options for weight management. - Consider alternative weight  management strategies.     I personally spent 30 minutes in the care of the patient today including preparing to see the patient, getting/reviewing separately obtained history, performing a medically appropriate exam/evaluation, counseling and educating, and placing orders.   Return in about 3 months (around 10/05/2024).    Miken Stecher T Bell Cai, PA-C    [1]  Social History Tobacco Use   Smoking status: Never    Passive exposure: Never   Smokeless tobacco: Never  Vaping Use   Vaping status: Never Used  Substance Use Topics   Alcohol use: Not Currently    Comment: been a while   Drug use: No  [2]  Allergies Allergen Reactions   Bee Pollen Anaphylaxis   Bee Venom Anaphylaxis   Dihydroergotamine Anaphylaxis   Imitrex [Sumatriptan] Other (See Comments)    SEIZURE-LIKE ACTIVITY   Metoclopramide  Other (See Comments)    TACHYCARDIA   Transderm-Scop [Scopolamine ] Other (See Comments)    AGITATION   "

## 2024-07-08 ENCOUNTER — Encounter (HOSPITAL_BASED_OUTPATIENT_CLINIC_OR_DEPARTMENT_OTHER): Payer: Self-pay | Admitting: Student

## 2024-07-08 ENCOUNTER — Other Ambulatory Visit: Payer: Self-pay | Admitting: *Deleted

## 2024-07-08 DIAGNOSIS — Z0279 Encounter for issue of other medical certificate: Secondary | ICD-10-CM

## 2024-07-08 NOTE — Progress Notes (Signed)
 Received message from pt team stating pt insurance has denied breast MRI Reason:  Your doctor told us  that you have been treated for cancer in your breast(s). The request cannot be approved because: Imaging is not supported if you had both of your breasts removed. This finding was based on review of [employer] [facility] Guidelines Section(s): Breast Cancer - Surveillance/Follow-up (ONC 11.4).  RN reviewed with MD and verbal orders received to cancel breast MRI.  MD states he will further discuss with pt during next visit.  Orders canceled, pt notified and verbalized understanding.

## 2024-07-12 ENCOUNTER — Ambulatory Visit: Payer: Self-pay | Admitting: Hematology and Oncology

## 2024-07-12 ENCOUNTER — Other Ambulatory Visit: Payer: Self-pay

## 2024-07-13 ENCOUNTER — Encounter (HOSPITAL_BASED_OUTPATIENT_CLINIC_OR_DEPARTMENT_OTHER): Payer: Self-pay | Admitting: Student

## 2024-07-22 ENCOUNTER — Other Ambulatory Visit (HOSPITAL_BASED_OUTPATIENT_CLINIC_OR_DEPARTMENT_OTHER): Payer: Self-pay | Admitting: Student

## 2024-07-22 DIAGNOSIS — E6609 Other obesity due to excess calories: Secondary | ICD-10-CM

## 2024-07-27 ENCOUNTER — Ambulatory Visit (INDEPENDENT_AMBULATORY_CARE_PROVIDER_SITE_OTHER): Admit: 2024-07-27 | Discharge: 2024-07-27 | Disposition: A | Discharge status: Home / Self Care | Admitting: Radiology

## 2024-07-27 ENCOUNTER — Ambulatory Visit (HOSPITAL_BASED_OUTPATIENT_CLINIC_OR_DEPARTMENT_OTHER)
Admission: EM | Admit: 2024-07-27 | Discharge: 2024-07-27 | Disposition: A | Discharge status: Home / Self Care | Attending: Family Medicine | Admitting: Family Medicine

## 2024-07-27 ENCOUNTER — Encounter (HOSPITAL_BASED_OUTPATIENT_CLINIC_OR_DEPARTMENT_OTHER): Payer: Self-pay

## 2024-07-27 ENCOUNTER — Other Ambulatory Visit (HOSPITAL_BASED_OUTPATIENT_CLINIC_OR_DEPARTMENT_OTHER): Payer: Self-pay

## 2024-07-27 ENCOUNTER — Ambulatory Visit (HOSPITAL_BASED_OUTPATIENT_CLINIC_OR_DEPARTMENT_OTHER): Payer: Self-pay | Admitting: Family Medicine

## 2024-07-27 DIAGNOSIS — R0989 Other specified symptoms and signs involving the circulatory and respiratory systems: Secondary | ICD-10-CM | POA: Diagnosis not present

## 2024-07-27 DIAGNOSIS — R6889 Other general symptoms and signs: Secondary | ICD-10-CM | POA: Diagnosis not present

## 2024-07-27 DIAGNOSIS — R059 Cough, unspecified: Secondary | ICD-10-CM

## 2024-07-27 MED ORDER — OSELTAMIVIR PHOSPHATE 75 MG PO CAPS
75.0000 mg | ORAL_CAPSULE | Freq: Two times a day (BID) | ORAL | 0 refills | Status: AC
  Filled 2024-07-27: qty 10, 5d supply, fill #0

## 2024-07-27 MED ORDER — ALBUTEROL SULFATE (2.5 MG/3ML) 0.083% IN NEBU
2.5000 mg | INHALATION_SOLUTION | Freq: Four times a day (QID) | RESPIRATORY_TRACT | 12 refills | Status: AC | PRN
  Filled 2024-07-27: qty 75, 7d supply, fill #0

## 2024-07-27 MED ORDER — PROMETHAZINE-DM 6.25-15 MG/5ML PO SYRP
5.0000 mL | ORAL_SOLUTION | Freq: Four times a day (QID) | ORAL | 0 refills | Status: AC | PRN
  Filled 2024-07-27: qty 118, 6d supply, fill #0

## 2024-07-27 NOTE — ED Triage Notes (Signed)
 Patient presents with cough, nasal congestion x 2 days. Cough is creating muscle pain to upper back and upper chest. Using mucinex bid, tylenol , ibuprofen. Fever over night hours. Cough is productive. Patient has a hx of back problems.

## 2024-07-27 NOTE — Discharge Instructions (Addendum)
 I believe that you have the flu. Treating with tamiflu . I have sent in albuterol , cough medicine to use as needed.  Rest, hydrate. Continue OTC meds as needed.

## 2024-07-27 NOTE — ED Provider Notes (Signed)
 " [facility]    CSN: [id number] Arrival date & time: 07/27/24  1032      History   Chief Complaint Chief Complaint  Patient presents with   Cough   Nasal Congestion    HPI Holly Wiley is a 49 y.o. female.   Patient presents with cough, nasal congestion x 2 days. Cough is creating muscle pain to upper back and upper chest. Using mucinex bid, tylenol , ibuprofen. Fever over night hours. Cough is productive. Patient has a hx of back problems. 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   Breast mass, right 10/20/2013   Cervical spondylosis 06/12/2021   Chronic pain syndrome 08/13/2019   Dehydration 07/24/2018   Dental crown present    Dysrhythmia, cardiac 07/23/2021   Family history of bladder cancer    Family history of breast cancer    Family history of colon cancer    Family history of pancreatic cancer    Genetic testing 08/20/2018   [employer] was ordered (80 genes): AIP, ALK, APC*,ATM*, AXIN2, BAP1, BARD1, BLM, BMPR1A, BRCA1*, BRCA2*, BRIP1*, CDC73, CDH1*, CDK4, CDKN1B, CDKN2A, CHEK2*, CTNNA1,DICER1, FANCC, FH, FLCN, GALNT12, HOXB13, KIT, MAX, MEN1, MET, MLH1*, MRE11A, MSH2*, MSH3, MSH6*, MUTYH*, NBN, NF1*,NF2, NTHL1, PALB2*, PDGFRA, PHOX2B, PMS2*, POLD1, POLE, POT1, PRKAR1A, PTCH1, PTEN*, RAD50, RAD51C*,   Heel cord tightness, right 11/10/2020   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   Impingement syndrome of right shoulder region 06/10/2018   Insomnia  06/19/2022   Macromastia 11/19/2013   Malignant neoplasm of upper-outer quadrant of right breast in female, 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/29/2018   Migraines    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), heterozygous     Nausea and vomiting 07/24/2018   Nausea without vomiting 07/24/2018   Neck pain 05/26/2021   Pain in joint of left shoulder 05/19/2014   Plantar fasciitis 11/10/2020   PONV (postoperative nausea and vomiting)    Port-A-Cath in place 07/16/2018   Postoperative wound infection 11/26/2018   Wound infection after surgery 11/28/2018    Patient Active Problem List   Diagnosis Date Noted   Insomnia 06/19/2022   Dental crown present 07/23/2021   Migraines 07/23/2021   Dysrhythmia, cardiac 07/23/2021   Cervical spondylosis 06/12/2021   Neck pain 05/26/2021   Heel cord tightness, right 11/10/2020   Plantar fasciitis 11/10/2020   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   Chronic pain syndrome 08/13/2019   Acquired absence of breast and absent nipple, 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   Brachial plexopathy 03/22/2019   Wound infection after surgery 11/28/2018   Breast cancer (HCC) 11/26/2018   Postoperative wound infection 11/26/2018   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   Genetic testing 08/20/2018   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   Dehydration 07/24/2018   Nausea and vomiting 07/24/2018   Nausea without vomiting 07/24/2018   Family history of breast cancer    Family history of colon cancer    Family  history of bladder cancer    Family history of pancreatic cancer    Port-A-Cath in place 07/16/2018   Malignant neoplasm of upper-outer quadrant of right breast in female, 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/29/2018   Impingement syndrome of right shoulder region 06/10/2018   Aftercare following right elbow joint replacement surgery 02/27/2017   Pain in joint of left shoulder  05/19/2014   Macromastia 11/19/2013   Breast mass, right 10/20/2013   History of seizure 2014   Abnormal Pap smear 01/01/2012    Past Surgical History:  Procedure Laterality Date   ablation on branch nerves on lower spine  04/09/2024   BILATERAL TOTAL MASTECTOMY WITH AXILLARY LYMPH NODE DISSECTION N/A 11/06/2018   Procedure: BILATERAL TOTAL MASTECTOMY WITH AXILLARY LYMPH NODE DISSECTION;  Surgeon: Gail Favorite, MD;  Location: [facility] OR;  Service: General;  Laterality: N/A;   BREAST BIOPSY Right 11/08/2013   Procedure: EXCISION OF RIGHT  BREAST MASS;  Surgeon: Favorite CHRISTELLA Gail, MD;  Location: [facility];  Service: General;  Laterality: Right;   BREAST RECONSTRUCTION WITH MASTOPLASTY Bilateral 08/03/2019   Procedure: Revision bilateral mastectomy flaps;  Surgeon: Arelia Filippo, MD;  Location: [facility];  Service: Plastics;  Laterality: Bilateral;   BUNIONECTOMY Left    CARPAL TUNNEL RELEASE Right 02/26/2017   CERVICAL CONE BIOPSY  05/2005   COLONOSCOPY  2014   DRESSING CHANGE UNDER ANESTHESIA Right 11/29/2018   Procedure: DRESSING CHANGE UNDER ANESTHESIA;  Surgeon: Gail Favorite, MD;  Location: [facility];  Service: General;  Laterality: Right;   IRRIGATION AND DEBRIDEMENT ABSCESS Right 11/28/2018   Procedure: Exploration and Drainage of Right Mastectomy wound.;  Surgeon: Gail Favorite, MD;  Location: [facility] OR;  Service: General;  Laterality: Right;   Left shoulder replacement  04/30/2024   PORT-A-CATH REMOVAL N/A 11/06/2018   Procedure: Removal Port-A-Cath;  Surgeon: Gail Favorite, MD;  Location: [facility] OR;  Service: General;  Laterality: N/A;   PORTACATH PLACEMENT Right 07/08/2018   Procedure: INSERTION PORT-A-CATH;  Surgeon: Gail Favorite, MD;  Location: [facility];  Service: General;  Laterality: Right;   SHOULDER ARTHROSCOPY W/ LABRAL REPAIR Right    SHOULDER ARTHROSCOPY W/ ROTATOR CUFF REPAIR Left    4 surgeries   TENOTOMY FOREARM /  WRIST Right 02/26/2017    OB History   No obstetric history on file.      Home Medications    Prior to Admission medications  Medication Sig Start Date End Date Taking? Authorizing Provider  albuterol  (PROVENTIL ) (2.5 MG/3ML) 0.083% nebulizer solution Take 3 mLs (2.5 mg total) by nebulization every 6 (six) hours as needed for wheezing or shortness of breath. 07/27/24  Yes Maloree Uplinger A, FNP  oseltamivir  (TAMIFLU ) 75 MG capsule Take 1 capsule (75 mg total) by mouth every 12 (twelve) hours. 07/27/24  Yes Imogen Maddalena A, FNP  promethazine -dextromethorphan (PROMETHAZINE -DM) 6.25-15 MG/5ML syrup Take 5 mLs by mouth 4 (four) times daily as needed for cough. 07/27/24  Yes Vayden Weinand A, FNP  acebutolol  (SECTRAL ) 200 MG capsule Take 1 capsule (200 mg total) by mouth 2 (two) times daily. Patient taking differently: Take 200 mg by mouth. 1- 2 times daily 08/08/23   Monetta Redell PARAS, MD  EPINEPHrine  0.3 mg/0.3 mL IJ SOAJ injection Inject 0.3 mg into the skin as needed for anaphylaxis. 02/23/24   Rothfuss, Jacob T, PA-C  Eszopiclone  3 MG TABS Take 1 tablet (3 mg total) by mouth at bedtime. Take immediately before bedtime 07/07/24   Rothfuss, Jacob T,  PA-C  gabapentin  (NEURONTIN ) 600 MG tablet Take 600 mg by mouth 3 (three) times daily. Patient taking differently: Take 600 mg by mouth as needed (three times a day as needed).    [provider]  HYDROcodone -acetaminophen  (NORCO) 10-325 MG tablet Take 0.5 tablets by mouth as needed (as needed (not taking daily)).    [provider]  lidocaine  (LIDODERM ) 5 % Place 1 patch onto the skin daily. 03/17/23   [provider]  mirtazapine  (REMERON ) 7.5 MG tablet Take 1 tablet (7.5 mg total) by mouth at bedtime. 07/07/24   Rothfuss, Jacob T, PA-C  Multiple Vitamin (MULTIVITAMIN WITH MINERALS) TABS tablet Take 1 tablet by mouth daily.    [provider]  ondansetron  (ZOFRAN -ODT) 4 MG disintegrating tablet TAKE 1 tablet by mouth as needed]  03/19/24   Rothfuss, Jacob T, PA-C  vortioxetine  HBr (TRINTELLIX ) 10 MG TABS tablet Take 1 tablet (10 mg total) by mouth daily. 06/08/24   Rothfuss, Lang DASEN, PA-C    Family History Family History  Problem Relation Age of Onset   Breast cancer Mother 70       triple negative, GT neg   Alzheimer's disease Father    Healthy Sister    Diabetes Maternal Aunt    Bladder Cancer Maternal Aunt 69   Colon cancer Maternal Uncle 65   Diabetes Maternal Uncle    Diabetes Maternal Grandmother    Stroke Paternal Grandfather    Colon cancer Other 60   Breast cancer Other        dx >50   Colon cancer Other        dx>50   Pancreatic cancer Other    Esophageal cancer Neg Hx    Rectal cancer Neg Hx     Social History Social History[1]   Allergies   Bee pollen, Bee venom, Dihydroergotamine, Imitrex [sumatriptan], Metoclopramide , and Transderm-scop [scopolamine ]   Review of Systems Review of Systems  Respiratory:  Positive for cough.      Physical Exam Triage Vital Signs ED Triage Vitals  Encounter Vitals Group     BP 07/27/24 1056 113/78     Girls Systolic BP Percentile --      Girls Diastolic BP Percentile --      Boys Systolic BP Percentile --      Boys Diastolic BP Percentile --      Pulse Rate 07/27/24 1056 76     Resp 07/27/24 1056 20     Temp 07/27/24 1056 98.9 F (37.2 C)     Temp Source 07/27/24 1056 Oral     SpO2 07/27/24 1056 97 %     Weight --      Height --      Head Circumference --      Peak Flow --      Pain Score 07/27/24 1058 7     Pain Loc --      Pain Education --      Exclude from Growth Chart --    No data found.  Updated Vital Signs BP 113/78 (BP Location: Left Arm)   Pulse 76   Temp 98.9 F (37.2 C) (Oral)   Resp 20   SpO2 97%   Visual Acuity Right Eye Distance:   Left Eye Distance:   Bilateral Distance:    Right Eye Near:   Left Eye Near:    Bilateral Near:     Physical Exam Constitutional:      General: She is not in acute  distress.  Appearance: Normal appearance. She is ill-appearing. She is not toxic-appearing or diaphoretic.  HENT:     Head: Normocephalic and atraumatic.     Right Ear: Tympanic membrane and ear canal normal.     Left Ear: Tympanic membrane and ear canal normal.     Nose: Congestion and rhinorrhea present.     Mouth/Throat:     Pharynx: Oropharynx is clear.  Eyes:     Conjunctiva/sclera: Conjunctivae normal.  Cardiovascular:     Rate and Rhythm: Normal rate and regular rhythm.     Pulses: Normal pulses.     Heart sounds: Normal heart sounds.  Pulmonary:     Effort: Pulmonary effort is normal.     Breath sounds: Normal breath sounds.  Skin:    General: Skin is warm and dry.  Neurological:     Mental Status: She is alert.  Psychiatric:        Mood and Affect: Mood normal.      UC Treatments / Results  Labs (all labs ordered are listed, but only abnormal results are displayed) Labs Reviewed - No data to display  EKG   Radiology DG Chest 2 View Result Date: 07/27/2024 EXAM: 2 VIEW(S) XRAY OF THE CHEST 07/27/2024 11:20:00 AM COMPARISON: 12/06/22. CLINICAL HISTORY: Cough, congestion. FINDINGS: LUNGS AND PLEURA: No focal pulmonary opacity. No pleural effusion. No pneumothorax. HEART AND MEDIASTINUM: No acute abnormality of the cardiac and mediastinal silhouettes. BONES AND SOFT TISSUES: Surgical clips in right chest wall. Left shoulder arthroplasty. IMPRESSION: 1. No acute process. Electronically signed by: Waddell Calk MD 07/27/2024 11:41 AM EST RP Workstation: [id number]    Procedures Procedures (including critical care time)  Medications Ordered in UC Medications - No data to display  Initial Impression / Assessment and Plan / UC Course  I have reviewed the triage vital signs and the nursing notes.  Pertinent labs & imaging results that were available during my care of the patient were reviewed by me and considered in my medical decision making (see chart for  details).     Flulike symptoms-chest x-ray without any concerns today.  This is most likely flulike illness.  Tamiflu  to treat flu.  I have sent in albuterol  to use as needed for cough, wheezing or shortness of breath.  Promethazine  cough syrup as needed.  Recommend rest, hydrate and follow-up as needed for continued issues Final Clinical Impressions(s) / UC Diagnoses   Final diagnoses:  Flu-like symptoms     Discharge Instructions      I believe that you have the flu. Treating with tamiflu . I have sent in albuterol , cough medicine to use as needed.  Rest, hydrate. Continue OTC meds as needed.    ED Prescriptions     Medication Sig Dispense Auth. Provider   albuterol  (PROVENTIL ) (2.5 MG/3ML) 0.083% nebulizer solution Take 3 mLs (2.5 mg total) by nebulization every 6 (six) hours as needed for wheezing or shortness of breath. 75 mL Jacklynn Dehaas A, FNP   oseltamivir  (TAMIFLU ) 75 MG capsule Take 1 capsule (75 mg total) by mouth every 12 (twelve) hours. 10 capsule Mikhayla Phillis A, FNP   promethazine -dextromethorphan (PROMETHAZINE -DM) 6.25-15 MG/5ML syrup Take 5 mLs by mouth 4 (four) times daily as needed for cough. 118 mL Adah Corning A, FNP      PDMP not reviewed this encounter.    [1]  Social History Tobacco Use   Smoking status: Never    Passive exposure: Never   Smokeless tobacco: Never  Vaping Use   Vaping  status: Never Used  Substance Use Topics   Alcohol use: Not Currently    Comment: been a while   Drug use: No     Adah Wilbert LABOR, FNP 07/27/24 1208  "

## 2024-07-30 ENCOUNTER — Other Ambulatory Visit

## 2024-08-05 ENCOUNTER — Inpatient Hospital Stay: Payer: Self-pay | Admitting: Hematology and Oncology

## 2024-08-05 ENCOUNTER — Inpatient Hospital Stay: Payer: Self-pay

## 2024-09-01 ENCOUNTER — Inpatient Hospital Stay: Admitting: Hematology and Oncology

## 2024-09-01 ENCOUNTER — Inpatient Hospital Stay

## 2024-09-27 ENCOUNTER — Ambulatory Visit (HOSPITAL_COMMUNITY): Payer: Self-pay | Admitting: Student in an Organized Health Care Education/Training Program

## 2024-10-05 ENCOUNTER — Ambulatory Visit (HOSPITAL_BASED_OUTPATIENT_CLINIC_OR_DEPARTMENT_OTHER): Admitting: Student

## 2024-12-07 ENCOUNTER — Ambulatory Visit (HOSPITAL_BASED_OUTPATIENT_CLINIC_OR_DEPARTMENT_OTHER): Admitting: Student
